# Patient Record
Sex: Female | Born: 1955 | Race: White | Hispanic: No | Marital: Married | Smoking: Never smoker
Health system: Southern US, Community
[De-identification: ages and names within clinical notes are randomized; demographics above are authoritative.]

## PROBLEM LIST (undated history)

## (undated) DIAGNOSIS — I10 Essential (primary) hypertension: Secondary | ICD-10-CM

## (undated) DIAGNOSIS — M1990 Unspecified osteoarthritis, unspecified site: Secondary | ICD-10-CM

## (undated) DIAGNOSIS — I2510 Atherosclerotic heart disease of native coronary artery without angina pectoris: Secondary | ICD-10-CM

## (undated) DIAGNOSIS — E119 Type 2 diabetes mellitus without complications: Secondary | ICD-10-CM

## (undated) HISTORY — DX: Essential (primary) hypertension: I10

---

## 1998-04-06 ENCOUNTER — Ambulatory Visit: Admission: RE | Admit: 1998-04-06 | Discharge: 1998-04-06 | Payer: Self-pay | Admitting: Internal Medicine

## 1998-07-23 ENCOUNTER — Ambulatory Visit (HOSPITAL_BASED_OUTPATIENT_CLINIC_OR_DEPARTMENT_OTHER): Admission: RE | Admit: 1998-07-23 | Discharge: 1998-07-23 | Payer: Self-pay | Admitting: Otolaryngology

## 1998-07-23 ENCOUNTER — Encounter: Payer: Self-pay | Admitting: Surgery

## 2013-05-09 ENCOUNTER — Ambulatory Visit: Payer: Self-pay | Admitting: Internal Medicine

## 2013-05-09 VITALS — BP 138/88 | HR 118 | Temp 98.0°F | Resp 16

## 2013-05-09 DIAGNOSIS — E663 Overweight: Secondary | ICD-10-CM

## 2013-05-09 DIAGNOSIS — R259 Unspecified abnormal involuntary movements: Secondary | ICD-10-CM

## 2013-05-09 DIAGNOSIS — R251 Tremor, unspecified: Secondary | ICD-10-CM

## 2013-05-09 DIAGNOSIS — E119 Type 2 diabetes mellitus without complications: Secondary | ICD-10-CM | POA: Insufficient documentation

## 2013-05-09 DIAGNOSIS — M25569 Pain in unspecified knee: Secondary | ICD-10-CM

## 2013-05-09 DIAGNOSIS — M25519 Pain in unspecified shoulder: Secondary | ICD-10-CM

## 2013-05-09 DIAGNOSIS — R42 Dizziness and giddiness: Secondary | ICD-10-CM

## 2013-05-09 DIAGNOSIS — E66811 Obesity, class 1: Secondary | ICD-10-CM | POA: Insufficient documentation

## 2013-05-09 DIAGNOSIS — E669 Obesity, unspecified: Secondary | ICD-10-CM | POA: Insufficient documentation

## 2013-05-09 LAB — POCT CBC
Granulocyte percent: 71.1 %G (ref 37–80)
HCT, POC: 47.4 % (ref 37.7–47.9)
Hemoglobin: 14.4 g/dL (ref 12.2–16.2)
Lymph, poc: 1.1 (ref 0.6–3.4)
MCH, POC: 29.8 pg (ref 27–31.2)
MCHC: 30.4 g/dL — AB (ref 31.8–35.4)
MCV: 97.9 fL — AB (ref 80–97)
MID (cbc): 0.4 (ref 0–0.9)
MPV: 11.6 fL (ref 0–99.8)
POC Granulocyte: 3.6 (ref 2–6.9)
POC LYMPH PERCENT: 20.9 %L (ref 10–50)
POC MID %: 8 %M (ref 0–12)
Platelet Count, POC: 123 10*3/uL — AB (ref 142–424)
RBC: 4.84 M/uL (ref 4.04–5.48)
RDW, POC: 14.6 %
WBC: 5.1 10*3/uL (ref 4.6–10.2)

## 2013-05-09 LAB — COMPREHENSIVE METABOLIC PANEL
ALT: 36 U/L — ABNORMAL HIGH (ref 0–35)
AST: 33 U/L (ref 0–37)
Albumin: 4.2 g/dL (ref 3.5–5.2)
Alkaline Phosphatase: 157 U/L — ABNORMAL HIGH (ref 39–117)
BUN: 14 mg/dL (ref 6–23)
CO2: 26 mEq/L (ref 19–32)
Calcium: 9.7 mg/dL (ref 8.4–10.5)
Chloride: 104 mEq/L (ref 96–112)
Creat: 0.63 mg/dL (ref 0.50–1.10)
Glucose, Bld: 160 mg/dL — ABNORMAL HIGH (ref 70–99)
Potassium: 4.3 mEq/L (ref 3.5–5.3)
Sodium: 139 mEq/L (ref 135–145)
Total Bilirubin: 0.8 mg/dL (ref 0.3–1.2)
Total Protein: 8.2 g/dL (ref 6.0–8.3)

## 2013-05-09 LAB — POCT GLYCOSYLATED HEMOGLOBIN (HGB A1C): Hemoglobin A1C: 7.4

## 2013-05-09 LAB — TSH: TSH: 1.763 u[IU]/mL (ref 0.350–4.500)

## 2013-05-09 MED ORDER — MELOXICAM 15 MG PO TABS: 15.0000 mg | ORAL_TABLET | Freq: Every day | ORAL | Status: DC

## 2013-05-09 NOTE — Progress Notes (Addendum)
Subjective:    Patient ID: Robin Schroeder, female    DOB: Sep 17, 1955, 58 y.o.   MRN: [id number] This chart was scribed for Ellamae Sia, MD by Valera Castle, ED Scribe. This patient was seen in room 09 and the patient's care was started at 12:06 PM.  Chief Complaint  Patient presents with  . Dizziness    x1 year  . Shoulder Pain    x2 weeks  . Knee Pain    x6 mth  . Otalgia    x1 week  . Cough    x1 mth    HPI Robin Schroeder is a 58 y.o. female who presents for her first [facility] office visit complaining of  intermittent episodes of dizziness, stating she has had trouble balancing, standing at times, onset 1 year ago. She denies having seen anyone for her symptoms. She reports associated otalgia, onset 1 month ago. She denies hearing change, ringing. She reports that her dizzy spells started before her ear problem. She reports that standing up exacerbates her dizziness. She reports falling 09/18/2010 due to dizziness, sustaining bruises over her left leg, left arm, and her nose. This does not occur on a daily basis. It seems progressively worse.  She reports constant, bilateral knee and shoulder joint pain, onset several months ago. She reports difficulty ambulating due to her pain. She reports trouble sleeping due to the pain. Husband reports that when she is resting, she is fine, but when working her pain is excruciating. She works as a Advice worker. She reports taking Aleve, with some relief, but states she also experiences ill side effects and does not take it often. She denies her having been formally diagnosed. She reports h/o tremors. She denies neck pain, and any other associated symptoms. She has noticed occasional swelling in her lower legs. Her knees are swollen.  She denies being formally diagnosed with DM, but husband reports that she had an episode of trembling 1 month ago. They called EMS, who recorded high sugar levels-359 with BP 210/117. She denies being hospitalized,  stating they told her she needs to be checked out by doctor but she has refused until female, . Husband reports that she has been eating better recently. He has controlled her diet and removed all sugar and in many carbohydrates over the last 2 weeks .  PCP - No PCP Per Patient  husband is concerned because she has no insurance .he cannot afford insurance offered by [employer] at $700 a month or by his company insurance at $520 a month . There are no active problems to display for this patient.  Prior to Admission medications   Not on File    Review of Systems  Constitutional: Negative for fever, appetite change and fatigue.  HENT: Positive for ear pain.        ? Hearing loss and as she reportedly needs extra volume for television/she has chronic cerumen impaction  Eyes: Negative for visual disturbance.  Respiratory: Negative for shortness of breath.   Cardiovascular: Negative for chest pain, palpitations and leg swelling.  Gastrointestinal: Negative for abdominal pain and diarrhea.  Genitourinary: Negative for difficulty urinating.  Musculoskeletal: Positive for arthralgias (bilateral shoulder, knee joint) and gait problem. Negative for neck pain.  Neurological: Positive for dizziness and tremors. Negative for syncope.      Objective:   Physical Exam  Nursing note and vitals reviewed. Constitutional: She is oriented to person, place, and time. She appears well-developed and well-nourished. No distress.  HENT:  Head: Normocephalic and atraumatic.  Mouth/Throat: Oropharynx is clear and moist.  Bilateral cerumen impaction  Eyes: Conjunctivae and EOM are normal. Pupils are equal, round, and reactive to light.  Neck: Neck supple. No thyromegaly present.  Neck range of motion is limited by stiffness but produces no radicular symptoms  Cardiovascular: Normal rate, regular rhythm, normal heart sounds and intact distal pulses.   No murmur heard. Pulmonary/Chest: Effort normal and breath sounds  normal. No respiratory distress.  Musculoskeletal: Normal range of motion. She exhibits tenderness. She exhibits no edema.  Frozen shoulder bilaterally. Pain with abduction against resistance. Pain at 45 abduction with stiffness. Both knees had diffuse swelling without effusion. Range of motion limited by pain and lacks full extension. Tender along both joint lines. No laxity  Lymphadenopathy:    She has no cervical adenopathy.  Neurological: She is alert and oriented to person, place, and time. She has normal reflexes. No cranial nerve deficit. Coordination normal.  Romberg is negative although she is unsteady standing due to knee pain. Persistent fine tremor in upper extremities at rest.  Skin: Skin is warm and dry. No rash noted.  Psychiatric: She has a normal mood and affect. Her behavior is normal.    BP 138/88  Pulse 118  Temp(Src) 98 F (36.7 C) (Oral)  Resp 16  SpO2 99%  Results for orders placed in visit on 05/09/13  POCT CBC      Result Value Range   WBC 5.1  4.6 - 10.2 K/uL   Lymph, poc 1.1  0.6 - 3.4   POC LYMPH PERCENT 20.9  10 - 50 %L   MID (cbc) 0.4  0 - 0.9   POC MID % 8.0  0 - 12 %M   POC Granulocyte 3.6  2 - 6.9   Granulocyte percent 71.1  37 - 80 %G   RBC 4.84  4.04 - 5.48 M/uL   Hemoglobin 14.4  12.2 - 16.2 g/dL   HCT, POC 18.5  63.1 - 47.9 %   MCV 97.9 (*) 80 - 97 fL   MCH, POC 29.8  27 - 31.2 pg   MCHC 30.4 (*) 31.8 - 35.4 g/dL   RDW, POC 49.7     Platelet Count, POC 123 (*) 142 - 424 K/uL   MPV 11.6  0 - 99.8 fL   hemoglobin A1c 7.4%    Assessment & Plan:   1. Dizziness   2. Knee pain --bilateral /moderate osteoarthritis   3. 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 Overweight   5. Tremor   6.  new onset diabetes-age uncertain 7.  probable hypertension but normal pressures today  8.  no recent medical care 9.  Uninsured 10. Unable to perform her work duties due to inability stand and  shoulder pain with use of arms  Plan -Continue diet control -[facility]erred [facility]/ -Referred to orthopedics for treatment of shoulders and knees in hopes that work capabilities can be restored---meloxicam for now [employer] of work papers completed with time extended to allow control of medical issues I have completed the patient encounter in its entirety as documented by the scribe, with editing by me where necessary. Krislynn Gronau P. Merla Riches, M.D.

## 2013-05-17 ENCOUNTER — Telehealth: Payer: Self-pay

## 2013-05-17 NOTE — Telephone Encounter (Signed)
Dr Merla Riches, please review

## 2013-05-17 NOTE — Telephone Encounter (Signed)
Patient last saw Robin Schroeder on the 23rd and was wondering why she hasn't heard back from [place] to give  her lab results. Please advise. If maybe someone else can review results and let her know. Thank you  Best: [phone]

## 2013-05-18 ENCOUNTER — Encounter: Payer: Self-pay | Admitting: Internal Medicine

## 2013-05-18 NOTE — Telephone Encounter (Signed)
Called pt, LMOM labs normal.

## 2013-05-18 NOTE — Telephone Encounter (Signed)
Tell her labs wnl-I had lab mail a copy with comments

## 2014-01-27 ENCOUNTER — Inpatient Hospital Stay (HOSPITAL_COMMUNITY): Payer: Medicaid Other

## 2014-01-27 ENCOUNTER — Encounter (HOSPITAL_COMMUNITY): Payer: Self-pay | Admitting: Emergency Medicine

## 2014-01-27 ENCOUNTER — Emergency Department (HOSPITAL_COMMUNITY): Payer: Medicaid Other

## 2014-01-27 ENCOUNTER — Inpatient Hospital Stay (HOSPITAL_COMMUNITY)
Admission: EM | Admit: 2014-01-27 | Discharge: 2014-02-04 | DRG: 872 | Disposition: A | Discharge status: Skilled Nursing Facility | Payer: Medicaid Other | Attending: Internal Medicine | Admitting: Internal Medicine

## 2014-01-27 DIAGNOSIS — R609 Edema, unspecified: Secondary | ICD-10-CM | POA: Diagnosis present

## 2014-01-27 DIAGNOSIS — E8809 Other disorders of plasma-protein metabolism, not elsewhere classified: Secondary | ICD-10-CM | POA: Diagnosis present

## 2014-01-27 DIAGNOSIS — E669 Obesity, unspecified: Secondary | ICD-10-CM | POA: Diagnosis present

## 2014-01-27 DIAGNOSIS — K5900 Constipation, unspecified: Secondary | ICD-10-CM | POA: Diagnosis not present

## 2014-01-27 DIAGNOSIS — N390 Urinary tract infection, site not specified: Secondary | ICD-10-CM | POA: Diagnosis present

## 2014-01-27 DIAGNOSIS — A4101 Sepsis due to Methicillin susceptible Staphylococcus aureus: Principal | ICD-10-CM | POA: Diagnosis present

## 2014-01-27 DIAGNOSIS — J9811 Atelectasis: Secondary | ICD-10-CM | POA: Diagnosis present

## 2014-01-27 DIAGNOSIS — B9620 Unspecified Escherichia coli [E. coli] as the cause of diseases classified elsewhere: Secondary | ICD-10-CM | POA: Diagnosis present

## 2014-01-27 DIAGNOSIS — Z7401 Bed confinement status: Secondary | ICD-10-CM | POA: Diagnosis not present

## 2014-01-27 DIAGNOSIS — M174 Other bilateral secondary osteoarthritis of knee: Secondary | ICD-10-CM

## 2014-01-27 DIAGNOSIS — M170 Bilateral primary osteoarthritis of knee: Secondary | ICD-10-CM | POA: Diagnosis present

## 2014-01-27 DIAGNOSIS — Z880 Allergy status to penicillin: Secondary | ICD-10-CM

## 2014-01-27 DIAGNOSIS — D696 Thrombocytopenia, unspecified: Secondary | ICD-10-CM | POA: Diagnosis present

## 2014-01-27 DIAGNOSIS — I5032 Chronic diastolic (congestive) heart failure: Secondary | ICD-10-CM | POA: Diagnosis present

## 2014-01-27 DIAGNOSIS — Z6830 Body mass index (BMI) 30.0-30.9, adult: Secondary | ICD-10-CM | POA: Diagnosis not present

## 2014-01-27 DIAGNOSIS — R52 Pain, unspecified: Secondary | ICD-10-CM

## 2014-01-27 DIAGNOSIS — M25511 Pain in right shoulder: Secondary | ICD-10-CM | POA: Diagnosis present

## 2014-01-27 DIAGNOSIS — E876 Hypokalemia: Secondary | ICD-10-CM | POA: Diagnosis present

## 2014-01-27 DIAGNOSIS — B373 Candidiasis of vulva and vagina: Secondary | ICD-10-CM | POA: Diagnosis present

## 2014-01-27 DIAGNOSIS — R0602 Shortness of breath: Secondary | ICD-10-CM

## 2014-01-27 DIAGNOSIS — K5909 Other constipation: Secondary | ICD-10-CM

## 2014-01-27 DIAGNOSIS — R338 Other retention of urine: Secondary | ICD-10-CM | POA: Diagnosis present

## 2014-01-27 DIAGNOSIS — M25512 Pain in left shoulder: Secondary | ICD-10-CM | POA: Diagnosis present

## 2014-01-27 DIAGNOSIS — E119 Type 2 diabetes mellitus without complications: Secondary | ICD-10-CM | POA: Diagnosis present

## 2014-01-27 DIAGNOSIS — Z791 Long term (current) use of non-steroidal anti-inflammatories (NSAID): Secondary | ICD-10-CM

## 2014-01-27 DIAGNOSIS — I10 Essential (primary) hypertension: Secondary | ICD-10-CM | POA: Diagnosis present

## 2014-01-27 DIAGNOSIS — M25562 Pain in left knee: Secondary | ICD-10-CM

## 2014-01-27 DIAGNOSIS — M25561 Pain in right knee: Secondary | ICD-10-CM

## 2014-01-27 DIAGNOSIS — B9561 Methicillin susceptible Staphylococcus aureus infection as the cause of diseases classified elsewhere: Secondary | ICD-10-CM | POA: Diagnosis present

## 2014-01-27 DIAGNOSIS — Z8249 Family history of ischemic heart disease and other diseases of the circulatory system: Secondary | ICD-10-CM

## 2014-01-27 DIAGNOSIS — R7881 Bacteremia: Secondary | ICD-10-CM

## 2014-01-27 DIAGNOSIS — A419 Sepsis, unspecified organism: Secondary | ICD-10-CM | POA: Diagnosis present

## 2014-01-27 DIAGNOSIS — R5381 Other malaise: Secondary | ICD-10-CM | POA: Diagnosis present

## 2014-01-27 HISTORY — DX: Type 2 diabetes mellitus without complications: E11.9

## 2014-01-27 HISTORY — DX: Unspecified osteoarthritis, unspecified site: M19.90

## 2014-01-27 LAB — URINALYSIS, ROUTINE W REFLEX MICROSCOPIC
GLUCOSE, UA: NEGATIVE mg/dL
Ketones, ur: 15 mg/dL — AB
Nitrite: POSITIVE — AB
Protein, ur: 30 mg/dL — AB
Specific Gravity, Urine: 1.029 (ref 1.005–1.030)
UROBILINOGEN UA: 4 mg/dL — AB (ref 0.0–1.0)
pH: 5.5 (ref 5.0–8.0)

## 2014-01-27 LAB — BASIC METABOLIC PANEL
ANION GAP: 14 (ref 5–15)
BUN: 22 mg/dL (ref 6–23)
CHLORIDE: 101 meq/L (ref 96–112)
CO2: 21 meq/L (ref 19–32)
Calcium: 8.8 mg/dL (ref 8.4–10.5)
Creatinine, Ser: 0.44 mg/dL — ABNORMAL LOW (ref 0.50–1.10)
GFR calc Af Amer: >90 mL/min (ref >90)
GFR calc non Af Amer: >90 mL/min (ref >90)
Glucose, Bld: 199 mg/dL — ABNORMAL HIGH (ref 70–99)
Potassium: 3.7 mEq/L (ref 3.7–5.3)
SODIUM: 136 meq/L — AB (ref 137–147)

## 2014-01-27 LAB — I-STAT CG4 LACTIC ACID, ED: Lactic Acid, Venous: 2.68 mmol/L — ABNORMAL HIGH (ref 0.5–2.2)

## 2014-01-27 LAB — CBC WITH DIFFERENTIAL/PLATELET
BASOS ABS: 0 10*3/uL (ref 0.0–0.1)
Basophils Relative: 0 % (ref 0–1)
Eosinophils Absolute: 0 10*3/uL (ref 0.0–0.7)
Eosinophils Relative: 0 % (ref 0–5)
HEMATOCRIT: 34.2 % — AB (ref 36.0–46.0)
HEMOGLOBIN: 11.4 g/dL — AB (ref 12.0–15.0)
LYMPHS PCT: 6 % — AB (ref 12–46)
Lymphs Abs: 0.3 10*3/uL — ABNORMAL LOW (ref 0.7–4.0)
MCH: 29.4 pg (ref 26.0–34.0)
MCHC: 33.3 g/dL (ref 30.0–36.0)
MCV: 88.1 fL (ref 78.0–100.0)
MONO ABS: 0.4 10*3/uL (ref 0.1–1.0)
MONOS PCT: 9 % (ref 3–12)
Neutro Abs: 3.9 10*3/uL (ref 1.7–7.7)
Neutrophils Relative %: 86 % — ABNORMAL HIGH (ref 43–77)
Platelets: 105 10*3/uL — ABNORMAL LOW (ref 150–400)
RBC: 3.88 MIL/uL (ref 3.87–5.11)
RDW: 15.5 % (ref 11.5–15.5)
WBC: 4.5 10*3/uL (ref 4.0–10.5)

## 2014-01-27 LAB — URINE MICROSCOPIC-ADD ON

## 2014-01-27 LAB — CBG MONITORING, ED: Glucose-Capillary: 189 mg/dL — ABNORMAL HIGH (ref 70–99)

## 2014-01-27 MED ORDER — ACETAMINOPHEN 325 MG PO TABS
650.0000 mg | ORAL_TABLET | Freq: Four times a day (QID) | ORAL | Status: DC | PRN
  Administered 2014-01-28: 650 mg via ORAL
  Filled 2014-01-27: qty 2

## 2014-01-27 MED ORDER — IBUPROFEN 400 MG PO TABS
400.0000 mg | ORAL_TABLET | Freq: Once | ORAL | Status: AC
  Administered 2014-01-27: 400 mg via ORAL
  Filled 2014-01-27: qty 1

## 2014-01-27 MED ORDER — MELOXICAM 15 MG PO TABS
15.0000 mg | ORAL_TABLET | Freq: Every day | ORAL | Status: DC
  Administered 2014-01-27 – 2014-02-04 (×9): 15 mg via ORAL
  Filled 2014-01-27 (×10): qty 1

## 2014-01-27 MED ORDER — INSULIN ASPART 100 UNIT/ML ~~LOC~~ SOLN
0.0000 [IU] | Freq: Three times a day (TID) | SUBCUTANEOUS | Status: DC
  Administered 2014-01-28: 2 [IU] via SUBCUTANEOUS
  Administered 2014-01-28: 3 [IU] via SUBCUTANEOUS
  Administered 2014-01-28: 2 [IU] via SUBCUTANEOUS
  Administered 2014-01-29: 3 [IU] via SUBCUTANEOUS
  Administered 2014-01-29: 2 [IU] via SUBCUTANEOUS
  Administered 2014-01-29 – 2014-01-30 (×2): 5 [IU] via SUBCUTANEOUS
  Administered 2014-01-30 – 2014-02-01 (×4): 3 [IU] via SUBCUTANEOUS
  Administered 2014-02-02 (×2): 2 [IU] via SUBCUTANEOUS
  Administered 2014-02-03: 8 [IU] via SUBCUTANEOUS
  Administered 2014-02-03: 3 [IU] via SUBCUTANEOUS
  Administered 2014-02-03: 5 [IU] via SUBCUTANEOUS
  Administered 2014-02-04: 3 [IU] via SUBCUTANEOUS
  Administered 2014-02-04: 2 [IU] via SUBCUTANEOUS

## 2014-01-27 MED ORDER — HEPARIN SODIUM (PORCINE) 5000 UNIT/ML IJ SOLN
5000.0000 [IU] | Freq: Three times a day (TID) | INTRAMUSCULAR | Status: DC
  Administered 2014-01-27 – 2014-01-30 (×8): 5000 [IU] via SUBCUTANEOUS
  Filled 2014-01-27 (×12): qty 1

## 2014-01-27 MED ORDER — DEXTROSE 5 % IV SOLN
1.0000 g | INTRAVENOUS | Status: DC
  Filled 2014-01-27: qty 10

## 2014-01-27 MED ORDER — DEXTROSE 5 % IV SOLN
1.0000 g | Freq: Once | INTRAVENOUS | Status: AC
  Administered 2014-01-27: 1 g via INTRAVENOUS
  Filled 2014-01-27: qty 10

## 2014-01-27 MED ORDER — SODIUM CHLORIDE 0.9 % IV BOLUS (SEPSIS)
1000.0000 mL | Freq: Once | INTRAVENOUS | Status: AC
  Administered 2014-01-27: 1000 mL via INTRAVENOUS

## 2014-01-27 MED ORDER — SODIUM CHLORIDE 0.9 % IV SOLN
Freq: Once | INTRAVENOUS | Status: AC
  Administered 2014-01-27: 19:00:00 via INTRAVENOUS

## 2014-01-27 MED ORDER — SODIUM CHLORIDE 0.9 % IV SOLN
INTRAVENOUS | Status: DC
  Administered 2014-01-27 – 2014-01-29 (×9): via INTRAVENOUS
  Administered 2014-02-01: 500 mL via INTRAVENOUS

## 2014-01-27 MED ORDER — ACETAMINOPHEN 325 MG PO TABS
650.0000 mg | ORAL_TABLET | Freq: Once | ORAL | Status: AC
  Administered 2014-01-27: 650 mg via ORAL
  Filled 2014-01-27: qty 2

## 2014-01-27 MED ORDER — SODIUM CHLORIDE 0.9 % IJ SOLN
3.0000 mL | Freq: Two times a day (BID) | INTRAMUSCULAR | Status: DC
  Administered 2014-01-27 – 2014-01-28 (×2): 3 mL via INTRAVENOUS

## 2014-01-27 NOTE — ED Notes (Signed)
Per EMS, pt comes from home A&OX4. NAD noted, pt denies chest pain or SOB. Pt has h/o hypertension, DM, and arthritis. Pt suffered a fall on Saturday. Pt hit back of head, pt denies LOC. Pt has had decrease movement and weakness for the past 4 days. Pt has had no appetite or has not eaten in the past 4 days, Pt has had incontinent episodes with foul urine. Last BM 1 week ago. Pt has not taken any meds in 10 mos. VS: BP 118/60, P130, R 22, T99.1, 95% RM air, 20g IV placed R hand, given 500cc of normal saline. Husband at bedside

## 2014-01-27 NOTE — ED Notes (Signed)
i-stat CG4 Lactic acid result given To Dr. Fayrene Fearing

## 2014-01-27 NOTE — ED Notes (Signed)
All pts belongings taken with pt and family upstairs.

## 2014-01-27 NOTE — H&P (Signed)
[facility] History and Physical  BRANDYE INTHAVONG YFV:[id number] DOB: Jan 01, 1956 DOA: 01/27/2014  Referring physician: EDP PCP: No PCP Per Patient   Chief Complaint: Generalized weakness   HPI: Robin Schroeder is a 58 y.o. female who presents to the ED for evaluation of weakness.  She has had a functional decline over the past few months due to knee and hip pain, she has been feeling much worse over the past 4 days.  Over the past 4 days she has had fever, chills, foul smelling urine, no dysuria.  Does have some SOB, mild cough, no rash.  Review of Systems: Systems reviewed.  As above, otherwise negative  Past Medical History  Diagnosis Date  . Hypertension   . Diabetes mellitus without complication   . Arthritis    History reviewed. No pertinent past surgical history. Social History:  reports that she has never smoked. She does not have any smokeless tobacco history on file. Her alcohol and drug histories are not on file.  Allergies  Allergen Reactions  . Penicillins     Family History  Problem Relation Age of Onset  . Hypertension Brother      Prior to Admission medications   Medication Sig Start Date End Date Taking? Authorizing Provider  meloxicam (MOBIC) 15 MG tablet Take 1 tablet (15 mg total) by mouth daily. 05/09/13   Tonye Pearson, MD   Physical Exam: Filed Vitals:   01/27/14 2015  BP: 124/66  Pulse: 126  Temp:   Resp:     BP 124/66  Pulse 126  Temp(Src) 103 F (39.4 C) (Rectal)  Resp 34  Ht 5\' 4"  (1.626 m)  Wt 90.719 kg (200 lb)  BMI 34.31 kg/m2  SpO2 94%  General Appearance:    Alert, oriented, appears toxic, appears stated age  Head:    Normocephalic, atraumatic  Eyes:    PERRL, EOMI, sclera non-icteric        Nose:   Nares without drainage or epistaxis. Mucosa, turbinates normal  Throat:   Moist mucous membranes. Oropharynx without erythema or exudate.  Neck:   Supple. No carotid bruits.  No thyromegaly.  No lymphadenopathy.    Back:     No CVA tenderness, no spinal tenderness  Lungs:     Clear to auscultation bilaterally, without wheezes, rhonchi or rales, tachypnea  Chest wall:    No tenderness to palpitation  Heart:    Sinus tach without murmurs, gallops, rubs  Abdomen:     Soft, non-tender, nondistended, normal bowel sounds, no organomegaly  Genitalia:    deferred  Rectal:    deferred  Extremities:   No clubbing, cyanosis or edema.  Pulses:   2+ and symmetric all extremities  Skin:   Skin color, texture, turgor normal, no rashes or lesions, or other obvious sources of infection  Lymph nodes:   Cervical, supraclavicular, and axillary nodes normal  Neurologic:   CNII-XII intact. Normal strength, sensation and reflexes      throughout    Labs on Admission:  Basic Metabolic Panel:  Recent Labs Lab 01/27/14 1820  NA 136*  K 3.7  CL 101  CO2 21  GLUCOSE 199*  BUN 22  CREATININE 0.44*  CALCIUM 8.8   Liver Function Tests: No results found for this basename: AST, ALT, ALKPHOS, BILITOT, PROT, ALBUMIN,  in the last 168 hours No results found for this basename: LIPASE, AMYLASE,  in the last 168 hours No results found for this basename: AMMONIA,  in the  last 168 hours CBC:  Recent Labs Lab 01/27/14 1820  WBC 4.5  NEUTROABS 3.9  HGB 11.4*  HCT 34.2*  MCV 88.1  PLT 105*   Cardiac Enzymes: No results found for this basename: CKTOTAL, CKMB, CKMBINDEX, TROPONINI,  in the last 168 hours  BNP (last 3 results) No results found for this basename: PROBNP,  in the last 8760 hours CBG: No results found for this basename: GLUCAP,  in the last 168 hours  Radiological Exams on Admission: Dg Chest Port 1 View  01/27/2014   CLINICAL DATA:  Confusion, shortness of breath.  Initial encounter.  EXAM: PORTABLE CHEST - 1 VIEW  COMPARISON:  None.  FINDINGS: Examination is degraded due to patient rotation and portable technique.  Borderline enlarged cardiac silhouette and mediastinal contours, possibly accentuated  due to decreased lung volumes and patient rotation to the left. Mild pulmonary venous congestion without frank evidence of edema. Minimal bilateral infrahilar heterogeneous opacities, right greater than left. Trace left-sided effusion is not excluded. No pneumothorax. No acute osseus abnormalities. Degenerative change of the right AC joint is suspected though incompletely evaluated.  IMPRESSION: Pulmonary venous congestion and bilateral infrahilar atelectasis suspected on this rotated AP portable examination. Further evaluation with a PA and lateral chest radiograph may be obtained as clinically indicated.   Electronically Signed   By: Simonne Come M.D.   On: 01/27/2014 18:47    EKG: Independently reviewed.  Assessment/Plan Principal Problem:   Sepsis Active Problems:   UTI (lower urinary tract infection)   DM2 (diabetes mellitus, type 2)   1. Sepsis, appears to be secondary to patients UTI -  1. US renal 2. Rocephin 3. Tele monitor 4. IVF 5. Tylenol for fever 6. Blood and urine cultures 7. Check LFTs 8. Repeat lactate in 6 hours 2. DM2 - not on any home meds, adding SSI med dose AC/HS    Code Status: Full Code  Family Communication: No family in room Disposition Plan: Admit to inpatient   Time spent: 70 min  GARDNER, JARED M. [facility] Pager [phone]  If 7AM-7PM, please contact the day team taking care of the patient [email or web address] Password [facility] 01/27/2014, 8:36 PM

## 2014-01-27 NOTE — ED Provider Notes (Signed)
CSN: [id number]     Arrival date & time 01/27/14  1651 History   First MD Initiated Contact with Patient 01/27/14 1700     Chief Complaint  Patient presents with  . Weakness      HPI  Patient presents for evaluation of weakness. She has most report a functional decline over the last few months. Primarily this is related to pain. As the knee and hip pain. On Saturday was attempting to get into bed and took a fall. No particular injury.  Decreased appetite. Shakes and chills at home and weakness. No episodes of confusion. The lateralizing weakness. Week to the point she having trouble ambulating with assistance to get to the bathroom. Strong smelling urine.  Past Medical History  Diagnosis Date  . Hypertension   . Diabetes mellitus without complication   . Arthritis    History reviewed. No pertinent past surgical history. Family History  Problem Relation Age of Onset  . Hypertension Brother    History  Substance Use Topics  . Smoking status: Never Smoker   . Smokeless tobacco: Not on file  . Alcohol Use: Not on file   OB History   Grav Para Term Preterm Abortions TAB SAB Ect Mult Living                 Review of Systems  Constitutional: Positive for activity change and appetite change. Negative for fever, chills, diaphoresis and fatigue.  HENT: Negative for mouth sores, sore throat and trouble swallowing.   Eyes: Negative for visual disturbance.  Respiratory: Positive for cough. Negative for chest tightness, shortness of breath and wheezing.   Cardiovascular: Negative for chest pain.  Gastrointestinal: Negative for nausea, vomiting, abdominal pain, diarrhea and abdominal distention.  Endocrine: Negative for polydipsia, polyphagia and polyuria.  Genitourinary: Positive for dysuria and frequency. Negative for hematuria.  Musculoskeletal: Positive for arthralgias and myalgias. Negative for gait problem.  Skin: Negative for color change, pallor and rash.  Neurological:  Positive for weakness. Negative for dizziness, syncope, light-headedness and headaches.  Hematological: Does not bruise/bleed easily.  Psychiatric/Behavioral: Negative for behavioral problems and confusion.      Allergies  Penicillins  Home Medications   Prior to Admission medications   Medication Sig Start Date End Date Taking? Authorizing Provider  meloxicam (MOBIC) 15 MG tablet Take 1 tablet (15 mg total) by mouth daily. 05/09/13   Tonye Pearson, MD   BP 142/67  Pulse 132  Temp(Src) 103 F (39.4 C) (Rectal)  Resp 34  Ht 5\' 4"  (1.626 m)  Wt 200 lb (90.719 kg)  BMI 34.31 kg/m2  SpO2 94% Physical Exam  Constitutional: She is oriented to person, place, and time. She appears ill. No distress.  Obese. Not confused. Appears to not fill the prednisone frankly toxic. Mentating well.  HENT:  Head: Normocephalic.  Conjunctiva not pale or injected.  Eyes: Conjunctivae are normal. Pupils are equal, round, and reactive to light. No scleral icterus.  No scleral icterus.  Neck: Normal range of motion. Neck supple. No thyromegaly present.  Cardiovascular: Normal rate and regular rhythm.  Exam reveals no gallop and no friction rub.   No murmur heard. Tachycardic. Sinus tach 120 on the monitor noted. No gallops. 1+ bilateral symmetric LE peripheral edema.  Pulmonary/Chest: Effort normal and breath sounds normal. No respiratory distress. She has no wheezes. She has no rales.  Tachypnea. Clear lungs.  Abdominal: Soft. Bowel sounds are normal. She exhibits no distension. There is no tenderness. There is no  rebound.    Musculoskeletal: Normal range of motion.   knees and ankle show no erythema or joint effusions.  Neurological: She is alert and oriented to person, place, and time.  Skin: Skin is warm and dry. No rash noted.  Psychiatric: She has a normal mood and affect. Her behavior is normal.    ED Course  Procedures (including critical care time) Labs Review Labs Reviewed    BASIC METABOLIC PANEL - Abnormal; Notable for the following:    Sodium 136 (*)    Glucose, Bld 199 (*)    Creatinine, Ser 0.44 (*)    All other components within normal limits  CBC WITH DIFFERENTIAL - Abnormal; Notable for the following:    Hemoglobin 11.4 (*)    HCT 34.2 (*)    Platelets 105 (*)    Neutrophils Relative % 86 (*)    Lymphocytes Relative 6 (*)    Lymphs Abs 0.3 (*)    All other components within normal limits  URINALYSIS, ROUTINE W REFLEX MICROSCOPIC - Abnormal; Notable for the following:    Color, Urine AMBER (*)    APPearance CLOUDY (*)    Hgb urine dipstick SMALL (*)    Bilirubin Urine MODERATE (*)    Ketones, ur 15 (*)    Protein, ur 30 (*)    Urobilinogen, UA 4.0 (*)    Nitrite POSITIVE (*)    Leukocytes, UA SMALL (*)    All other components within normal limits  URINE MICROSCOPIC-ADD ON - Abnormal; Notable for the following:    Squamous Epithelial / LPF FEW (*)    Bacteria, UA MANY (*)    Casts GRANULAR CAST (*)    All other components within normal limits  I-STAT CG4 LACTIC ACID, ED - Abnormal; Notable for the following:    Lactic Acid, Venous 2.68 (*)    All other components within normal limits  CULTURE, BLOOD (ROUTINE X 2)  CULTURE, BLOOD (ROUTINE X 2)  URINE CULTURE    Imaging Review Dg Chest Port 1 View  01/27/2014   CLINICAL DATA:  Confusion, shortness of breath.  Initial encounter.  EXAM: PORTABLE CHEST - 1 VIEW  COMPARISON:  None.  FINDINGS: Examination is degraded due to patient rotation and portable technique.  Borderline enlarged cardiac silhouette and mediastinal contours, possibly accentuated due to decreased lung volumes and patient rotation to the left. Mild pulmonary venous congestion without frank evidence of edema. Minimal bilateral infrahilar heterogeneous opacities, right greater than left. Trace left-sided effusion is not excluded. No pneumothorax. No acute osseus abnormalities. Degenerative change of the right AC joint is suspected  though incompletely evaluated.  IMPRESSION: Pulmonary venous congestion and bilateral infrahilar atelectasis suspected on this rotated AP portable examination. Further evaluation with a PA and lateral chest radiograph may be obtained as clinically indicated.   Electronically Signed   By: Simonne Come M.D.   On: 01/27/2014 18:47     EKG Interpretation   Date/Time:  Tuesday January 27 2014 17:05:11 EDT Ventricular Rate:  140 PR Interval:  123 QRS Duration: 84 QT Interval:  313 QTC Calculation: 478 R Axis:   49 Text Interpretation:  Sinus tachycardia Confirmed by Fayrene Fearing  MD, Walther Sanagustin  [phone]) on 01/27/2014 5:07:55 PM      MDM   Final diagnoses:  SOB (shortness of breath)    Urine appears infected. Atelectasis on x-ray. After Tylenol her temperature improved. We'll respect 103. Renal function intact. His been given additional fluids and by mouth Motrin. Given IV Rocephin.  She continues to [facility] well. Hemodynamic stable.    Rolland Porter, MD 01/27/14 2002

## 2014-01-27 NOTE — ED Notes (Signed)
Pt's husband stated that pt has not bathed in a year, and asked if we could bathe her. Husband told that pt will be given a bed bath when and if time permits during ED visit.

## 2014-01-28 ENCOUNTER — Inpatient Hospital Stay (HOSPITAL_COMMUNITY): Payer: Medicaid Other

## 2014-01-28 DIAGNOSIS — R5381 Other malaise: Secondary | ICD-10-CM | POA: Diagnosis present

## 2014-01-28 DIAGNOSIS — I5032 Chronic diastolic (congestive) heart failure: Secondary | ICD-10-CM | POA: Diagnosis present

## 2014-01-28 DIAGNOSIS — M170 Bilateral primary osteoarthritis of knee: Secondary | ICD-10-CM | POA: Diagnosis present

## 2014-01-28 DIAGNOSIS — E876 Hypokalemia: Secondary | ICD-10-CM | POA: Diagnosis present

## 2014-01-28 DIAGNOSIS — I10 Essential (primary) hypertension: Secondary | ICD-10-CM | POA: Diagnosis present

## 2014-01-28 DIAGNOSIS — I059 Rheumatic mitral valve disease, unspecified: Secondary | ICD-10-CM

## 2014-01-28 DIAGNOSIS — A4101 Sepsis due to Methicillin susceptible Staphylococcus aureus: Secondary | ICD-10-CM | POA: Diagnosis present

## 2014-01-28 LAB — CBC
HCT: 30.1 % — ABNORMAL LOW (ref 36.0–46.0)
HEMOGLOBIN: 9.9 g/dL — AB (ref 12.0–15.0)
MCH: 28.4 pg (ref 26.0–34.0)
MCHC: 32.9 g/dL (ref 30.0–36.0)
MCV: 86.5 fL (ref 78.0–100.0)
PLATELETS: 100 10*3/uL — AB (ref 150–400)
RBC: 3.48 MIL/uL — AB (ref 3.87–5.11)
RDW: 15.5 % (ref 11.5–15.5)
WBC: 5.3 10*3/uL (ref 4.0–10.5)

## 2014-01-28 LAB — BASIC METABOLIC PANEL
ANION GAP: 14 (ref 5–15)
BUN: 32 mg/dL — ABNORMAL HIGH (ref 6–23)
CALCIUM: 8.5 mg/dL (ref 8.4–10.5)
CHLORIDE: 102 meq/L (ref 96–112)
CO2: 22 mEq/L (ref 19–32)
CREATININE: 0.57 mg/dL (ref 0.50–1.10)
GFR calc Af Amer: >90 mL/min (ref >90)
GFR calc non Af Amer: >90 mL/min (ref >90)
GLUCOSE: 187 mg/dL — AB (ref 70–99)
Potassium: 3.2 mEq/L — ABNORMAL LOW (ref 3.7–5.3)
Sodium: 138 mEq/L (ref 137–147)

## 2014-01-28 LAB — IRON AND TIBC
IRON: 11 ug/dL — AB (ref 42–135)
Saturation Ratios: 9 % — ABNORMAL LOW (ref 20–55)
TIBC: 117 ug/dL — ABNORMAL LOW (ref 250–470)
UIBC: 106 ug/dL — AB (ref 125–400)

## 2014-01-28 LAB — HEMOGLOBIN A1C
HEMOGLOBIN A1C: 6.2 % — AB (ref <5.7)
MEAN PLASMA GLUCOSE: 131 mg/dL — AB (ref <117)

## 2014-01-28 LAB — TSH: TSH: 1.9 u[IU]/mL (ref 0.350–4.500)

## 2014-01-28 LAB — GLUCOSE, CAPILLARY
GLUCOSE-CAPILLARY: 167 mg/dL — AB (ref 70–99)
GLUCOSE-CAPILLARY: 132 mg/dL — AB (ref 70–99)
GLUCOSE-CAPILLARY: 148 mg/dL — AB (ref 70–99)
Glucose-Capillary: 127 mg/dL — ABNORMAL HIGH (ref 70–99)

## 2014-01-28 LAB — FOLATE: FOLATE: 5.8 ng/mL

## 2014-01-28 LAB — HEPATIC FUNCTION PANEL
ALT: 15 U/L (ref 0–35)
AST: 27 U/L (ref 0–37)
Albumin: 1.7 g/dL — ABNORMAL LOW (ref 3.5–5.2)
Alkaline Phosphatase: 109 U/L (ref 39–117)
BILIRUBIN INDIRECT: 0.6 mg/dL (ref 0.3–0.9)
Bilirubin, Direct: 1.5 mg/dL — ABNORMAL HIGH (ref 0.0–0.3)
TOTAL PROTEIN: 6.8 g/dL (ref 6.0–8.3)
Total Bilirubin: 2.1 mg/dL — ABNORMAL HIGH (ref 0.3–1.2)

## 2014-01-28 LAB — RETICULOCYTES
RBC.: 3.5 MIL/uL — AB (ref 3.87–5.11)
RETIC COUNT ABSOLUTE: 52.5 10*3/uL (ref 19.0–186.0)
Retic Ct Pct: 1.5 % (ref 0.4–3.1)

## 2014-01-28 LAB — FERRITIN: Ferritin: 1524 ng/mL — ABNORMAL HIGH (ref 10–291)

## 2014-01-28 LAB — VITAMIN B12: Vitamin B-12: 708 pg/mL (ref 211–911)

## 2014-01-28 LAB — LACTIC ACID, PLASMA: LACTIC ACID, VENOUS: 1.6 mmol/L (ref 0.5–2.2)

## 2014-01-28 LAB — MRSA PCR SCREENING: MRSA by PCR: NEGATIVE

## 2014-01-28 MED ORDER — POTASSIUM CHLORIDE 20 MEQ/15ML (10%) PO LIQD
40.0000 meq | Freq: Once | ORAL | Status: AC
  Administered 2014-01-28: 40 meq via ORAL
  Filled 2014-01-28: qty 30

## 2014-01-28 MED ORDER — SODIUM CHLORIDE 0.9 % IV BOLUS (SEPSIS)
500.0000 mL | Freq: Once | INTRAVENOUS | Status: AC
  Administered 2014-01-28: 500 mL via INTRAVENOUS

## 2014-01-28 MED ORDER — VANCOMYCIN HCL IN DEXTROSE 1-5 GM/200ML-% IV SOLN
1000.0000 mg | Freq: Two times a day (BID) | INTRAVENOUS | Status: DC
  Administered 2014-01-28 – 2014-01-31 (×8): 1000 mg via INTRAVENOUS
  Filled 2014-01-28 (×9): qty 200

## 2014-01-28 MED ORDER — GLUCERNA SHAKE PO LIQD
237.0000 mL | Freq: Two times a day (BID) | ORAL | Status: DC
  Administered 2014-01-28 – 2014-02-02 (×7): 237 mL via ORAL

## 2014-01-28 MED ORDER — ACETAMINOPHEN 325 MG PO TABS
650.0000 mg | ORAL_TABLET | ORAL | Status: DC | PRN
  Administered 2014-01-29 – 2014-02-04 (×4): 650 mg via ORAL
  Filled 2014-01-28 (×4): qty 2

## 2014-01-28 MED ORDER — MORPHINE SULFATE 2 MG/ML IJ SOLN
1.0000 mg | Freq: Once | INTRAMUSCULAR | Status: AC
  Administered 2014-01-28: 1 mg via INTRAVENOUS
  Filled 2014-01-28: qty 1

## 2014-01-28 MED ORDER — CETYLPYRIDINIUM CHLORIDE 0.05 % MT LIQD
7.0000 mL | Freq: Two times a day (BID) | OROMUCOSAL | Status: DC
  Administered 2014-01-28 – 2014-02-04 (×15): 7 mL via OROMUCOSAL

## 2014-01-28 MED ORDER — MORPHINE SULFATE 2 MG/ML IJ SOLN
0.5000 mg | INTRAMUSCULAR | Status: DC | PRN
  Administered 2014-01-28 – 2014-01-29 (×3): 1 mg via INTRAVENOUS
  Filled 2014-01-28 (×3): qty 1

## 2014-01-28 MED ORDER — PNEUMOCOCCAL VAC POLYVALENT 25 MCG/0.5ML IJ INJ
0.5000 mL | INJECTION | INTRAMUSCULAR | Status: AC
  Administered 2014-01-29: 0.5 mL via INTRAMUSCULAR
  Filled 2014-01-28: qty 0.5

## 2014-01-28 MED ORDER — INFLUENZA VAC SPLIT QUAD 0.5 ML IM SUSY
0.5000 mL | PREFILLED_SYRINGE | INTRAMUSCULAR | Status: AC
Start: 2014-01-29 — End: 2014-01-29
  Administered 2014-01-29: 0.5 mL via INTRAMUSCULAR
  Filled 2014-01-28: qty 0.5

## 2014-01-28 MED ORDER — OXYCODONE HCL 5 MG PO TABS
5.0000 mg | ORAL_TABLET | ORAL | Status: DC | PRN
  Administered 2014-01-29 – 2014-02-02 (×4): 5 mg via ORAL
  Filled 2014-01-28 (×4): qty 1

## 2014-01-28 MED ORDER — BETHANECHOL CHLORIDE 10 MG PO TABS
10.0000 mg | ORAL_TABLET | ORAL | Status: AC
  Administered 2014-01-28 (×3): 10 mg via ORAL
  Filled 2014-01-28 (×6): qty 1

## 2014-01-28 NOTE — Progress Notes (Signed)
ANTIBIOTIC CONSULT NOTE - INITIAL  Pharmacy Consult for vancomycin Indication: bacteremia  Allergies  Allergen Reactions  . Penicillins Rash    Patient Measurements: Height: 5\' 4"  (162.6 cm) Weight: 187 lb 6.3 oz (85 kg) IBW/kg (Calculated) : 54.7  Vital Signs: Temp: 98.1 F (36.7 C) (10/14 1221) Temp Source: Oral (10/14 1221) BP: 108/55 mmHg (10/14 0836) Pulse Rate: 121 (10/14 0700) Intake/Output from previous day: 10/13 0701 - 10/14 0700 In: 1750 [I.V.:750; IV Piggyback:1000] Out: -  Intake/Output from this shift: Total I/O In: 762.6 [P.O.:120; I.V.:642.6] Out: -   Labs:  Recent Labs  01/27/14 1820 01/28/14 0123  WBC 4.5 5.3  HGB 11.4* 9.9*  PLT 105* 100*  CREATININE 0.44* 0.57   Estimated Creatinine Clearance: 79.8 ml/min (by C-G formula based on Cr of 0.57). No results found for this basename: VANCOTROUGH, 01/30/14, VANCORANDOM, GENTTROUGH, GENTPEAK, GENTRANDOM, TOBRATROUGH, TOBRAPEAK, TOBRARND, AMIKACINPEAK, AMIKACINTROU, AMIKACIN,  in the last 72 hours   Microbiology: Recent Results (from the past 720 hour(s))  CULTURE, BLOOD (ROUTINE X 2)     Status: None   Collection Time    01/27/14  6:20 PM      Result Value Ref Range Status   Specimen Description BLOOD LEFT ARM   Final   Special Requests BOTTLES DRAWN AEROBIC AND ANAEROBIC 10CC   Final   Culture  Setup Time     Final   Value: 01/27/2014 22:12     Performed at 01/29/2014   Culture     Final   Value: GRAM POSITIVE COCCI IN CLUSTERS     Note: Gram Stain Report Called to,Read Back By and Verified With: STEPHANIE SHAW 01/28/14 0855 BY SMITHERSJ     Performed at 01/30/14   Report Status PENDING   Incomplete  CULTURE, BLOOD (ROUTINE X 2)     Status: None   Collection Time    01/27/14  6:30 PM      Result Value Ref Range Status   Specimen Description BLOOD LEFT HAND   Final   Special Requests BOTTLES DRAWN AEROBIC AND ANAEROBIC 10CC   Final   Culture  Setup Time     Final   Value: 01/27/2014 22:11     Performed at 01/29/2014   Culture     Final   Value: GRAM POSITIVE COCCI IN CLUSTERS     Note: Gram Stain Report Called to,Read Back By and Verified With: STEPHANIE SHAW 01/28/14 0855 BY SMITHERSJ     Performed at 01/30/14   Report Status PENDING   Incomplete  MRSA PCR SCREENING     Status: None   Collection Time    01/27/14 11:18 PM      Result Value Ref Range Status   MRSA by PCR NEGATIVE  NEGATIVE Final   Comment:            The GeneXpert MRSA Assay (FDA     approved for NASAL specimens     only), is one component of a     comprehensive MRSA colonization     surveillance program. It is not     intended to diagnose MRSA     infection nor to guide or     monitor treatment for     MRSA infections.    Medical History: Past Medical History  Diagnosis Date  . Hypertension   . Diabetes mellitus without complication   . Arthritis     Medications:  Prescriptions prior to admission  Medication Sig Dispense Refill  . naproxen sodium (ANAPROX) 220 MG tablet Take 440 mg by mouth 2 (two) times daily as needed (for pain).      . Throat Lozenges (COUGH DROPS MT) Use as directed 1 lozenge in the mouth or throat daily as needed (for cough).       Assessment: 58 year old woman who presented to [facility] yesterday with weakness, fever and severe hip and knee pain.  Patient was initially started on ceftriaxone for presumed UTI.  Blood cultures now growing gram positive cocci in 2 sets.  Goal of Therapy:  Vancomycin trough level 15-20 mcg/ml  Plan:  Measure antibiotic drug levels at steady state Follow up culture results Vancomycin 1g IV q12 Continue to follow renal function  Mickeal Skinner 01/28/2014,1:52 PM

## 2014-01-28 NOTE — Progress Notes (Signed)
  Echocardiogram 2D Echocardiogram has been performed.  [name] 01/28/2014, 11:46 AM

## 2014-01-28 NOTE — Progress Notes (Signed)
Lab called with blood culture results.  Gram positive cocci in clusters.  Reported to Junious Silk, RN.  No new orders received.  Will continue to monitor.

## 2014-01-28 NOTE — Care Management Note (Signed)
    Page 1 of 1   01/28/2014     10:04:39 AM CARE MANAGEMENT NOTE 01/28/2014  Patient:  Robin Schroeder, Robin Schroeder   Account Number:  [id number]  Date Initiated:  01/28/2014  Documentation initiated by:  Loralai Eisman  Subjective/Objective Assessment:   dx sepsis; lives with spouse     In-house referral  Clinical Social Worker      DC Associate Professor  CM consult      Status of service:  In process, will continue to follow  Per UR Regulation:  Reviewed for med. necessity/level of care/duration of stay  Comments:  01/28/2014 0930 Sylver Vantassell RN MSN BSN CCM Per spouse, pt has been refusing to bathe, eat, or walk. States he wants placement for her.  Discussed reimbursement for facility as pt has no insurance.  He states he will pay privately.  CSW notified.

## 2014-01-28 NOTE — Progress Notes (Signed)
[facility]  Robin Schroeder:[id number] DOB: 1956-04-01 DOA: 01/27/2014 PCP: No PCP Per Patient  Brief narrative: 58 year old female who presented to the emergency department with complaints of generalized weakness. Patient has severe knee and hip pain and has experienced functional decline over the past few months with symptoms being worse in the past 4 days. She is also noticed fevers, chills, and  foul-smelling urine over this same time period.  Upon presentation to the ER she was tachycardic with a heart rate of 140, tachypneic with respiratory rates in the 30s. Her rectal temperature was 103 but she was normotensive. Chest x-ray was concerning for possible pulmonary venous congestion as well as bilateral infrahilar atelectasis but the film was also quite rotated. Patient's white count was normal at 4500 at presentation but she did have a left shift with neutrophils 86%. She appeared to be quite dehydrated with a lactic acid of 2.68 in setting of normal BUN and creatinine as well as a sodium of 136. A urinalysis was concerning for urinary tract infection. Urine culture and blood cultures were also obtained and empiric abx began by the emergency room physician.  Since admission patient reports that several days prior to admission she had made an attempt to ambulate to the bathroom but fell so basically returned to her bed and would not ambulate after that point. Based on her descriptions it seems she may have been incontinent in bed because of fear of falling. Her husband has approached the case manager and was requesting placement upon discharge pending PT and OT evaluations. Unfortunately the patient does not have any long-term care insurance so this option will need to be explored.  HPI/Subjective: Patient awake and with any attempts to move her in the bed literally screaming out and complaining of severe pain in both knees. Currently denies chest pain  or shortness of breath at rest. Tells me she is not on any medications for blood pressure or sugar at home. States she mainly takes Aleve for her knee pain. Denies issues with abdominal discomfort or dark or bloody stools.  Assessment/Plan:  Sepsis due to UTI w/ bacteremia  Source at this point clarified as bacteremia possibly from UTI - followup on final blood cultures as well as urine culture - continue supportive care - had issues with hypotension after admission but this has improved with hydration - lactic acid has decreased to 1.6 since admission  UTI / acute urinary retention Urine strongly positive but culture pending - continue empiric therapy - attempted to insert foley catheter since had acute urinary retention after admission w/ greater than 600 cc retained urine noted on bladder scan, but urethral meatus was not able to be located - w/ normal creatine and abscence of sx, will monitor output w/o foley for now at her request, but if pt unable to pass urine spontaneously within next few hours, will need to ask foley cath team to attempt   Gram-positive bacteremia - 2/2 blood cx  Source possibly urine - continue broad-spectrum empiric antibiotic - follow speciation   Diabetes mellitus, type 2 A1c in January was 7.4 and repeat is pending this admission - CBGs well controlled - was not taking medications prior to admission - continue sliding scale insulin for now  Physical deconditioning / Severe Osteoarthritis of both knees Mobility limited by severe pain - patient has been given morphine and OxyIR this admission which seems to be helping - continue NSAIDs cautiously -  once patient more stable from an infectious processes obtain PT and OT evaluations - husband has been requesting patient be placed in skilled nursing facility upon discharge; case manager working with husband to determine if financially able to pursue this option  Obesity (BMI 30.0-34.9) Likely contributing to patient's  osteoarthritis symptoms  Chronic diastolic heart failure, NYHA class 1 Compensated - new finding on echo this admission  Hypokalemia Oral replete - follow labs  DVT prophylaxis: Subcutaneous heparin Code Status: Full Family Communication: Husband not at bedside during my exam but has communicated with the case manager regarding discharge planning Disposition Plan/Expected LOS: Stepdown - probable transfer to med bed in AM  Consultants: None  Procedures: 2-D echocardiogram: - Left ventricle: There is a false tendon in the LV apex of no clinical significance. The cavity size was normal. There was mild concentric hypertrophy. Systolic function was normal. The estimated ejection fraction was in the range of 55% to 60%. Wall motion was normal; there were no regional wall motion abnormalities. There was an increased relative contribution of atrial contraction to ventricular filling. Doppler parameters are consistent with abnormal left ventricular relaxation (grade 1 diastolic dysfunction). - Mitral valve: There was mild regurgitation. - Pulmonary arteries: PA peak pressure: 37 mm Hg (S) consistent with mild pulmonary hypertension   Antibiotics: Rocephin 10/13  Vancomycin 10/14 >  Objective: Blood pressure 108/55, pulse 121, temperature 98.1 F (36.7 C), temperature source Oral, resp. rate 35, height 5\' 4"  (1.626 m), weight 187 lb 6.3 oz (85 kg), SpO2 97.00%.  Intake/Output Summary (Last 24 hours) at 01/28/14 1317 Last data filed at 01/28/14 1300  Gross per 24 hour  Intake 2512.58 ml  Output      0 ml  Net 2512.58 ml   Exam: Gen: No acute respiratory distress - alert and conversant  Chest: Clear to auscultation bilaterally without wheezes, rhonchi or crackles, room air Cardiac: Regular rate and rhythm, S1-S2, no rubs murmurs or gallops, no peripheral edema, no JVD Abdomen: Soft nontender nondistended without obvious hepatosplenomegaly, no ascites Extremities: Symmetrical in  appearance without cyanosis, clubbing or effusion but knees are very tender to touch noting no erythematous changes to skin above knees/no clinical findings to suggest septic arthritis   Scheduled Meds:  Scheduled Meds: . antiseptic oral rinse  7 mL Mouth Rinse BID  . cefTRIAXone (ROCEPHIN)  IV  1 g Intravenous Q24H  . heparin  5,000 Units Subcutaneous 3 times per day  . [START ON 01/29/2014] Influenza vac split quadrivalent PF  0.5 mL Intramuscular Tomorrow-1000  . insulin aspart  0-15 Units Subcutaneous TID WC  . meloxicam  15 mg Oral Daily  . [START ON 01/29/2014] pneumococcal 23 valent vaccine  0.5 mL Intramuscular Tomorrow-1000  . sodium chloride  3 mL Intravenous Q12H    Data Reviewed: Basic Metabolic Panel:  Recent Labs Lab 01/27/14 1820 01/28/14 0123  NA 136* 138  K 3.7 3.2*  CL 101 102  CO2 21 22  GLUCOSE 199* 187*  BUN 22 32*  CREATININE 0.44* 0.57  CALCIUM 8.8 8.5   Liver Function Tests:  Recent Labs Lab 01/27/14 2342  AST 27  ALT 15  ALKPHOS 109  BILITOT 2.1*  PROT 6.8  ALBUMIN 1.7*   CBC:  Recent Labs Lab 01/27/14 1820 01/28/14 0123  WBC 4.5 5.3  NEUTROABS 3.9  --   HGB 11.4* 9.9*  HCT 34.2* 30.1*  MCV 88.1 86.5  PLT 105* 100*   CBG:  Recent Labs Lab 01/27/14 2203 01/28/14 0835  01/28/14 1223  GLUCAP 189* 167* 127*    Recent Results (from the past 240 hour(s))  CULTURE, BLOOD (ROUTINE X 2)     Status: None   Collection Time    01/27/14  6:20 PM      Result Value Ref Range Status   Specimen Description BLOOD LEFT ARM   Final   Special Requests BOTTLES DRAWN AEROBIC AND ANAEROBIC 10CC   Final   Culture  Setup Time     Final   Value: 01/27/2014 22:12     Performed at [facility]   Culture     Final   Value: GRAM POSITIVE COCCI IN CLUSTERS     Note: Gram Stain Report Called to,Read Back By and Verified With: STEPHANIE SHAW 01/28/14 0855 BY SMITHERSJ     Performed at [facility]   Report Status PENDING    Incomplete  CULTURE, BLOOD (ROUTINE X 2)     Status: None   Collection Time    01/27/14  6:30 PM      Result Value Ref Range Status   Specimen Description BLOOD LEFT HAND   Final   Special Requests BOTTLES DRAWN AEROBIC AND ANAEROBIC 10CC   Final   Culture  Setup Time     Final   Value: 01/27/2014 22:11     Performed at [facility]   Culture     Final   Value: GRAM POSITIVE COCCI IN CLUSTERS     Note: Gram Stain Report Called to,Read Back By and Verified With: STEPHANIE SHAW 01/28/14 0855 BY SMITHERSJ     Performed at [facility]   Report Status PENDING   Incomplete  MRSA PCR SCREENING     Status: None   Collection Time    01/27/14 11:18 PM      Result Value Ref Range Status   MRSA by PCR NEGATIVE  NEGATIVE Final   Comment:            The GeneXpert MRSA Assay (FDA     approved for NASAL specimens     only), is one component of a     comprehensive MRSA colonization     surveillance program. It is not     intended to diagnose MRSA     infection nor to guide or     monitor treatment for     MRSA infections.     Studies:  Recent x-ray studies have been reviewed in detail by the Attending Physician  Time spent :  35 mins  Junious Silk, ANP [facility] Office  [phone] Pager [phone]  On-Call/Text Page:      [email or web address]      password [email or web address]  If 7PM-7AM, please contact night-coverage [email or web address] Password [email or web address] 01/28/2014, 1:17 PM   LOS: 1 day   I have personally examined this patient and reviewed the entire database. I have reviewed the above note, made any necessary editorial changes, and agree with its content.  Lonia Blood, MD [facility]

## 2014-01-28 NOTE — Progress Notes (Signed)
CRITICAL VALUE ALERT  Critical value received:  Blood cultures - gram positive cocci in clusters  Date of notification:  01/28/14  Time of notification:  0835  Critical value read back:Yes.    Nurse who received alert:  Nicole Cella, RN  MD notified (1st page):  Junious Silk. NP  Time of first page:  [phone]  MD notified (2nd page):  Time of second page:  Responding MD:  Junious Silk, NP  Time MD responded:  [phone]

## 2014-01-28 NOTE — Progress Notes (Signed)
Attempted to insert foley.  Unable to locate urethra.  MD notified. New orders placed.  Patient is stable at this time.  Will continue to monitor.

## 2014-01-28 NOTE — Progress Notes (Signed)
INITIAL NUTRITION ASSESSMENT  DOCUMENTATION CODES Per approved criteria  -Obesity Unspecified   INTERVENTION: Glucerna Shake po BID, each supplement provides 220 kcal and 10 grams of protein  NUTRITION DIAGNOSIS: Inadequate oral intake related to decreased appetite as evidenced by diet hx, poor po intake x 1 week.   Goal: Pt will meet >90% of estimated nutritional needs  Monitor:  PO intake, labs, weight changes, I/O's  Reason for Assessment: MST, low braden  58 y.o. female  Admitting Dx: <principal problem not specified>  58 year old female who presented to the emergency department with complaints of generalized weakness. Patient has severe knee and hip pain and has experienced functional decline over the past few months with symptoms being worse in the past 4 days. She is also noticed fevers chills foul-smelling urine over this same time period.  ASSESSMENT: Pt reports poor appetite for approximately one week, due to illness. Pt reports she ate "a little bit" of lunch today- a few bites of fruit and baked ziti. She denies any difficulty chewing or swallowing. Prior to this week, she reveals her appetite is fairly good. Diet recall PTA is as follows: Breakfast: fruit (orange, tangerine, or banana); Lunch: fried chicken and salad OR pizza; Dinner: tomato slices. She denies snacking between meals or at night. Beverages consist of only water. Her husband does most of the food preparation at home. Per pt, she eliminated sugar sweetened and caffeinated beverages in January 2015, when she was diagnosed with DM and HTN at a local urgent care clinic.  Pt reports she has lost weight, however, difficult to determine intentional vs unintentional wt loss. No wt hx available in EPIC. Pt reports she weighed 314# back in 2006, however, has decreased portions and tried to make healthier food choices, as she expressed that her obesity was affecting her health. She expressed concern over prevalence of  heart disease in her family. She reports that she knows she has lost weight, as her rings are too large to fit securely on her fingers.  Educated pt on importance of PO intake to promote healing process. Pt is agreeable to try supplements during her hospitalization. Given suboptimal Hgb A1c levels and high sodium food choices in diet hx, pt caregivers may benefit from diet education if pt is discharged back home.  Pt husband desires placement for pt pending PT/OT evaluations. Case Management following to determine if this is viable option, as pt is uninsured.  Labs reviewed. K: 3.2. BUN: 32. Glucose: 187, CBGs: 127-298 (trending down). Most recent Hgb A1c: 7.4 back in 04/2013. Pt does not take any medications for DM PTA; currently receiving insulin as an inpatient.   Nutrition Focused Physical Exam:  Subcutaneous Fat:  Orbital Region: mild depletion Upper Arm Region: WDL Thoracic and Lumbar Region: WDL  Muscle:  Temple Region: WDL Clavicle Bone Region: WDL Clavicle and Acromion Bone Region: WDL Scapular Bone Region: WDL Dorsal Hand: WDL Patellar Region: WDL Anterior Thigh Region: WDL Posterior Calf Region: WDL  Edema: none present  Height: Ht Readings from Last 1 Encounters:  01/27/14 5\' 4"  (1.626 m)    Weight: Wt Readings from Last 1 Encounters:  01/28/14 187 lb 6.3 oz (85 kg)    Ideal Body Weight: 120#  % Ideal Body Weight: 156%  Wt Readings from Last 10 Encounters:  01/28/14 187 lb 6.3 oz (85 kg)    Usual Body Weight: unknown  % Usual Body Weight: unknown  BMI:  Body mass index is 32.15 kg/(m^2). Obesity, class I  Estimated  Nutritional Needs: Kcal: 1900-2100 Protein: 85-95 grams Fluid: 1.9-2.1 L  Skin: open area between buttocks, MASD on lt and rt groin  Diet Order: Carb Control  EDUCATION NEEDS: -Education needs addressed   Intake/Output Summary (Last 24 hours) at 01/28/14 1423 Last data filed at 01/28/14 1300  Gross per 24 hour  Intake 2512.58 ml   Output      0 ml  Net 2512.58 ml    Last BM: PTA  Labs:   Recent Labs Lab 01/27/14 1820 01/28/14 0123  NA 136* 138  K 3.7 3.2*  CL 101 102  CO2 21 22  BUN 22 32*  CREATININE 0.44* 0.57  CALCIUM 8.8 8.5  GLUCOSE 199* 187*    CBG (last 3)   Recent Labs  01/27/14 2203 01/28/14 0835 01/28/14 1223  GLUCAP 189* 167* 127*   Lab Results  Component Value Date   HGBA1C 7.4 05/09/2013   Scheduled Meds: . antiseptic oral rinse  7 mL Mouth Rinse BID  . heparin  5,000 Units Subcutaneous 3 times per day  . [START ON 01/29/2014] Influenza vac split quadrivalent PF  0.5 mL Intramuscular Tomorrow-1000  . insulin aspart  0-15 Units Subcutaneous TID WC  . meloxicam  15 mg Oral Daily  . [START ON 01/29/2014] pneumococcal 23 valent vaccine  0.5 mL Intramuscular Tomorrow-1000  . sodium chloride  3 mL Intravenous Q12H  . vancomycin  1,000 mg Intravenous Q12H    Continuous Infusions: . sodium chloride 125 mL/hr at 01/28/14 1011    Past Medical History  Diagnosis Date  . Hypertension   . Diabetes mellitus without complication   . Arthritis     History reviewed. No pertinent past surgical history.  Kordel Leavy A. Mayford Knife, RD, LDN Pager: [phone] After hours Pager: [phone]

## 2014-01-29 DIAGNOSIS — B9620 Unspecified Escherichia coli [E. coli] as the cause of diseases classified elsewhere: Secondary | ICD-10-CM

## 2014-01-29 DIAGNOSIS — M170 Bilateral primary osteoarthritis of knee: Secondary | ICD-10-CM

## 2014-01-29 DIAGNOSIS — I5032 Chronic diastolic (congestive) heart failure: Secondary | ICD-10-CM

## 2014-01-29 DIAGNOSIS — M25561 Pain in right knee: Secondary | ICD-10-CM

## 2014-01-29 DIAGNOSIS — B958 Unspecified staphylococcus as the cause of diseases classified elsewhere: Secondary | ICD-10-CM

## 2014-01-29 DIAGNOSIS — M25562 Pain in left knee: Secondary | ICD-10-CM

## 2014-01-29 LAB — CBC
HEMATOCRIT: 31.4 % — AB (ref 36.0–46.0)
Hemoglobin: 9.9 g/dL — ABNORMAL LOW (ref 12.0–15.0)
MCH: 27.7 pg (ref 26.0–34.0)
MCHC: 31.5 g/dL (ref 30.0–36.0)
MCV: 87.7 fL (ref 78.0–100.0)
PLATELETS: 84 10*3/uL — AB (ref 150–400)
RBC: 3.58 MIL/uL — ABNORMAL LOW (ref 3.87–5.11)
RDW: 16 % — AB (ref 11.5–15.5)
WBC: 2.2 10*3/uL — ABNORMAL LOW (ref 4.0–10.5)

## 2014-01-29 LAB — COMPREHENSIVE METABOLIC PANEL
ALBUMIN: 1.5 g/dL — AB (ref 3.5–5.2)
ALT: 15 U/L (ref 0–35)
ANION GAP: 10 (ref 5–15)
AST: 33 U/L (ref 0–37)
Alkaline Phosphatase: 92 U/L (ref 39–117)
BILIRUBIN TOTAL: 1.2 mg/dL (ref 0.3–1.2)
BUN: 20 mg/dL (ref 6–23)
CHLORIDE: 110 meq/L (ref 96–112)
CO2: 22 mEq/L (ref 19–32)
CREATININE: 0.38 mg/dL — AB (ref 0.50–1.10)
Calcium: 8.5 mg/dL (ref 8.4–10.5)
GFR calc non Af Amer: >90 mL/min (ref >90)
Glucose, Bld: 143 mg/dL — ABNORMAL HIGH (ref 70–99)
POTASSIUM: 3.9 meq/L (ref 3.7–5.3)
Sodium: 142 mEq/L (ref 137–147)
Total Protein: 6.2 g/dL (ref 6.0–8.3)

## 2014-01-29 LAB — GLUCOSE, CAPILLARY
GLUCOSE-CAPILLARY: 122 mg/dL — AB (ref 70–99)
GLUCOSE-CAPILLARY: 147 mg/dL — AB (ref 70–99)
Glucose-Capillary: 228 mg/dL — ABNORMAL HIGH (ref 70–99)
Glucose-Capillary: 153 mg/dL — ABNORMAL HIGH (ref 70–99)

## 2014-01-29 MED ORDER — CIPROFLOXACIN IN D5W 400 MG/200ML IV SOLN
400.0000 mg | Freq: Two times a day (BID) | INTRAVENOUS | Status: DC
  Administered 2014-01-29: 400 mg via INTRAVENOUS
  Filled 2014-01-29 (×3): qty 200

## 2014-01-29 NOTE — Consult Note (Addendum)
[facility]     Reason for Consult: Staph bacteremia    Referring Physician: [facility]/Dr. Joseph Art  Active Problems:   Obesity (BMI 30.0-34.9)   Sepsis   UTI (lower urinary tract infection)   DM2 (diabetes mellitus, type 2)   Gram-positive bacteremia   Chronic diastolic heart failure, NYHA class 1   Physical deconditioning   Severe Osteoarthritis of both knees   Hypokalemia   HTN (hypertension)   . antiseptic oral rinse  7 mL Mouth Rinse BID  . ciprofloxacin  400 mg Intravenous Q12H  . feeding supplement (GLUCERNA SHAKE)  237 mL Oral BID BM  . heparin  5,000 Units Subcutaneous 3 times per day  . insulin aspart  0-15 Units Subcutaneous TID WC  . meloxicam  15 mg Oral Daily  . vancomycin  1,000 mg Intravenous Q12H    Recommendations: Continue with vancomycin  Will need TEE Repeat blood cultures D/c cipro  Assessment: Staph aureus bacteremia. TTE negative.  Also with ? UTI but she tells me she was asymptomatic.  I don't see any report of symptoms and this may just be asymptomatic bacteruria with E coli.  Minimal WBC on initial UA.  I will d/c cipro.    Antibiotics: Vancomycin and cipro  HPI: Robin Schroeder is a 58 y.o. female with several months of decline and particular weakness, malaise, fever, chills for about 4 days prior to admission.  Initial work up suggested urine as source with a few WBCs but blood cultures now positive for Staph aureus 2/2.  She feels better since admission.  Vancomycin had already been started.     Review of Systems: A comprehensive review of systems was negative.  Past Medical History  Diagnosis Date  . Hypertension   . Diabetes mellitus without complication   . Arthritis     History  Substance Use Topics  . Smoking status: Never Smoker   . Smokeless tobacco: Not on file  . Alcohol Use: Not on file    Family History  Problem Relation Age of Onset  . Hypertension Brother    Allergies  Allergen  Reactions  . Penicillins Rash    OBJECTIVE: Blood pressure 113/51, pulse 104, temperature 98.3 F (36.8 C), temperature source Oral, resp. rate 21, height 5\' 4"  (1.626 m), weight 187 lb 6.3 oz (85 kg), SpO2 98.00%. General: awake, alert, nad Skin: no rashes Lungs: CTA B Cor: RRR Abdomen: soft, nt, nd Ext: no edema  Microbiology: Recent Results (from the past 240 hour(s))  URINE CULTURE     Status: None   Collection Time    01/27/14  5:55 PM      Result Value Ref Range Status   Specimen Description URINE, CATHETERIZED   Final   Special Requests Normal   Final   Culture  Setup Time     Final   Value: 01/27/2014 22:18     Performed at 01/29/2014 Count     Final   Value: >=100,000 COLONIES/ML     Performed at [facility]   Culture     Final   Value: ESCHERICHIA COLI     Performed at [facility]   Report Status PENDING   Incomplete  CULTURE, BLOOD (ROUTINE X 2)     Status: None   Collection Time    01/27/14  6:20 PM      Result Value Ref Range Status   Specimen Description BLOOD LEFT ARM  Final   Special Requests BOTTLES DRAWN AEROBIC AND ANAEROBIC 10CC   Final   Culture  Setup Time     Final   Value: 01/27/2014 22:12     Performed at [facility]   Culture     Final   Value: STAPHYLOCOCCUS AUREUS     Note: RIFAMPIN AND GENTAMICIN SHOULD NOT BE USED AS SINGLE DRUGS FOR TREATMENT OF STAPH INFECTIONS.     Note: Gram Stain Report Called to,Read Back By and Verified With: STEPHANIE SHAW 01/28/14 0855 BY SMITHERSJ     Performed at [facility]   Report Status PENDING   Incomplete  CULTURE, BLOOD (ROUTINE X 2)     Status: None   Collection Time    01/27/14  6:30 PM      Result Value Ref Range Status   Specimen Description BLOOD LEFT HAND   Final   Special Requests BOTTLES DRAWN AEROBIC AND ANAEROBIC 10CC   Final   Culture  Setup Time     Final   Value: 01/27/2014 22:11     Performed at [facility]   Culture      Final   Value: STAPHYLOCOCCUS AUREUS     Note: Gram Stain Report Called to,Read Back By and Verified With: STEPHANIE SHAW 01/28/14 0855 BY SMITHERSJ     Performed at [facility]   Report Status PENDING   Incomplete  MRSA PCR SCREENING     Status: None   Collection Time    01/27/14 11:18 PM      Result Value Ref Range Status   MRSA by PCR NEGATIVE  NEGATIVE Final   Comment:            The GeneXpert MRSA Assay (FDA     approved for NASAL specimens     only), is one component of a     comprehensive MRSA colonization     surveillance program. It is not     intended to diagnose MRSA     infection nor to guide or     monitor treatment for     MRSA infections.    Staci Righter, MD [facility] [email or web address] [phone] pager  [phone] cell 01/29/2014, 3:56 PM

## 2014-01-29 NOTE — Progress Notes (Signed)
Report called to Olegario Messier, RN on 6E.  Updated on patient history, current status, and plan of care.  Patient is stable and able to transfer at this time.

## 2014-01-29 NOTE — Progress Notes (Signed)
PT Cancellation Note  Patient Details Name: Robin Schroeder MRN: [id number] DOB: January 18, 1956   Cancelled Treatment:    Reason Eval/Treat Not Completed: Patient declined, no reason specified.  Pt adamantly declining mobility at this time stating she doesn't feel ready.  Max encouragement for participation, however pt continues to decline.  Will try back another time.     Carmen Tolliver, Alison Murray 01/29/2014, 10:44 AM

## 2014-01-29 NOTE — Clinical Social Work Psychosocial (Signed)
Clinical Social Work Department BRIEF PSYCHOSOCIAL ASSESSMENT 01/29/2014  Patient:  Robin Schroeder, Robin Schroeder     Account Number:  [id number]     Admit date:  01/27/2014  Clinical Social Worker:  Merlyn Lot, CLINICAL SOCIAL WORKER  Date/Time:  01/29/2014 01:40 PM  Referred by:  RN  Date Referred:  01/29/2014 Referred for  SNF Placement   Other Referral:   Interview type:  Patient Other interview type:   husband also at bedside    PSYCHOSOCIAL DATA Living Status:  HUSBAND Admitted from facility:   Level of care:   Primary support name:  Zamzam Whinery Primary support relationship to patient:  SPOUSE Degree of support available:   Unclear level of support.  Patient and husband live together and husband reports being concerned after latest fall and encouraging the patient to seek medical attention despite lack of insurance. Patient and husband have been married 18 years, husband is from [place], patient is from [place]. Some concern from ED reports which stated that patient had been unbathed for a year.  Patient also commented while CSW was in the room that husband was only concerned about getting insurance for himself.    CURRENT CONCERNS Current Concerns  Post-Acute Placement   Other Concerns:    SOCIAL WORK ASSESSMENT / PLAN CSW spoke with patient and patients husband at bedside. Patient's husband stated that he is a handicap airport transporter and only makes 22,000 a year while patient has been unemployed for over a year now due to chronic knee pain.  Patient and patients husband feel as if it is unsafe for patient to return home after this most recent fall and her lack of mobility at the moment.  Patients husband is interested in insurance options or any financial aid that can be given.   Assessment/plan status:  Psychosocial Support/Ongoing Assessment of Needs Other assessment/ plan:   Information/referral to community resources:   [employer] financial counseling     PATIENT'S/FAMILY'S RESPONSE TO PLAN OF CARE: Patient and patients husband are agreeable to SNF bed search and to CSW referral to financial counseling.       Merlyn Lot, LCSWA Clinical Social Worker [phone]

## 2014-01-29 NOTE — Progress Notes (Signed)
[facility]  Robin Schroeder YOK:[id number] DOB: [birth year] DOA: 01/27/2014 PCP: No PCP Per Patient  Brief narrative: 58 year old female who presented to the emergency department with complaints of generalized weakness. Patient has severe knee and hip pain and has experienced functional decline over the past few months with symptoms being worse in the past 4 days. She is also noticed fevers, chills, and  foul-smelling urine over this same time period.  Upon presentation to the ER she was tachycardic with a heart rate of 140, tachypneic with respiratory rates in the 30s. Her rectal temperature was 103 but she was normotensive. Chest x-ray was concerning for possible pulmonary venous congestion as well as bilateral infrahilar atelectasis but the film was also quite rotated. Patient's white count was normal at 4500 at presentation but she did have a left shift with neutrophils 86%. She appeared to be quite dehydrated with a lactic acid of 2.68 in setting of normal BUN and creatinine as well as a sodium of 136. A urinalysis was concerning for urinary tract infection. Urine culture and blood cultures were also obtained and empiric abx began by the emergency room physician.  Since admission patient reports that several days prior to admission she had made an attempt to ambulate to the bathroom but fell so basically returned to her bed and would not ambulate after that point. Based on her descriptions it seems she may have been incontinent in bed because of fear of falling. Her husband has approached the case manager and was requesting placement upon discharge pending PT and OT evaluations. Unfortunately the patient does not have any long-term care insurance so this option will need to be explored.  HPI/Subjective: Alert and endorsing improved knee pain with use of morphine. More alert animated today. States feels better after Foley catheter placed. Appetite improving.  No chest pain or shortness of breath  Assessment/Plan:  Sepsis due to Escherichia coli UTI and staph aureus bacteremia  Source at this point clarified as bacteremia and Escherichia coli UTI - followup on final blood culture sensitivities as well as urine culture -adjust antibiotics based on culture findings- continue supportive care - had issues with hypotension after admission but this has improved with hydration - lactic acid has decreased to 1.6 since admission-remains hemodynamically stable  Escherichia coli UTI / acute urinary retention Urine culture sensitivities pending - Rocephin discontinued 10/14 in favor of vancomycin to cover gram-positive bacteremia but now urine culture returned positive for gram-negative Escherichia coli so will begin Cipro- 10/14 attempted to insert foley catheter for acute urinary retention after admission w/ greater than 600 cc retained urine noted on bladder scan, but urethral meatus was not able to be located -later catheter able to be inserted successfully  Staph Aureus bacteremia - 2/2 blood cx  Source unclear-able to examine all skin surfaces except for back and buttocks this morning and no obvious lesions -dentition is poor but no obvious abscesses or dental carries-patient denies ear pain or other symptoms that would be suggestive of ear infection -continue broad-spectrum empiric antibiotic - follow speciation  -Will require TEE dependent upon species  Diabetes mellitus, type 2 A1c in January was 7.4 and repeat this admission is 6.2 - CBGs well controlled - was not taking medications prior to admission - continue sliding scale insulin for now-likely can remain diet controlled at home  Physical deconditioning / Severe Osteoarthritis of both knees Mobility limited by severe pain - patient has been given morphine  and OxyIR this admission which seems to be helping - continue NSAIDs cautiously - obtain PT and OT evaluations - husband has been requesting patient  be placed in skilled nursing facility upon discharge; case manager working with husband to determine if financially able to pursue this option -Upon discharge refer to orthopedic surgery for pain control, workup for bilateral TKA  Obesity (BMI 30.0-34.9) Likely contributing to patient's osteoarthritis symptoms  Chronic diastolic heart failure, NYHA class 1 Compensated - new finding on echo this admission  Hypokalemia Oral replete - follow labs    DVT prophylaxis: Subcutaneous heparin Code Status: Full Family Communication: Husband at bedside Disposition Plan/Expected LOS: Transfer to floor  Consultants: Dr. Staci Righter (ID)   Procedures: 10/14 2-D echocardiogram: - Left ventricle:  Mild concentric hypertrophy. -LVEF= 55% to 60%. - (grade 1 diastolic dysfunction). - Pulmonary arteries: PA peak pressure: 37 mm Hg (S) consistent with mild pulmonary hypertension  10/14 bilateral knees;Severe narrowing of all 3 compartments. Severe osteophyte formation    Cultures 10/13 urine positive Escherichia coli 10/13 blood left arm/hand positive staph aureus 10/13 MRSA by PCR negative   Antibiotics: Rocephin 10/13 >10/14 Vancomycin 10/14 > Cipro 10/15 >    Objective: Blood pressure 106/55, pulse 96, temperature 98.3 F (36.8 C), temperature source Oral, resp. rate 17, height 5\' 4"  (1.626 m), weight 187 lb 6.3 oz (85 kg), SpO2 97.00%.  Intake/Output Summary (Last 24 hours) at 01/29/14 1050 Last data filed at 01/29/14 0800  Gross per 24 hour  Intake 3127.09 ml  Output    715 ml  Net 2412.09 ml   Exam: Gen: No acute respiratory distress - alert and conversant  Chest: Clear to auscultation bilaterally, room air Cardiac: Regular rate and rhythm, S1-S2, no rubs murmurs or gallops, no peripheral edema, no JVD-IV fluid at 125 per hour Abdomen: Soft nontender nondistended without obvious hepatosplenomegaly, no ascites Extremities: Symmetrical in appearance without cyanosis,  positive bilateral effusion,knees are very tender to touch noting no erythematous changes to skin above knees/no clinical findings to suggest septic arthritis   Scheduled Meds:  Scheduled Meds: . antiseptic oral rinse  7 mL Mouth Rinse BID  . ciprofloxacin  400 mg Intravenous Q12H  . feeding supplement (GLUCERNA SHAKE)  237 mL Oral BID BM  . heparin  5,000 Units Subcutaneous 3 times per day  . Influenza vac split quadrivalent PF  0.5 mL Intramuscular Tomorrow-1000  . insulin aspart  0-15 Units Subcutaneous TID WC  . meloxicam  15 mg Oral Daily  . pneumococcal 23 valent vaccine  0.5 mL Intramuscular Tomorrow-1000  . vancomycin  1,000 mg Intravenous Q12H    Data Reviewed: Basic Metabolic Panel:  Recent Labs Lab 01/27/14 1820 01/28/14 0123 01/29/14 0342  NA 136* 138 142  K 3.7 3.2* 3.9  CL 101 102 110  CO2 21 22 22   GLUCOSE 199* 187* 143*  BUN 22 32* 20  CREATININE 0.44* 0.57 0.38*  CALCIUM 8.8 8.5 8.5   Liver Function Tests:  Recent Labs Lab 01/27/14 2342 01/29/14 0342  AST 27 33  ALT 15 15  ALKPHOS 109 92  BILITOT 2.1* 1.2  PROT 6.8 6.2  ALBUMIN 1.7* 1.5*   CBC:  Recent Labs Lab 01/27/14 1820 01/28/14 0123 01/29/14 0342  WBC 4.5 5.3 2.2*  NEUTROABS 3.9  --   --   HGB 11.4* 9.9* 9.9*  HCT 34.2* 30.1* 31.4*  MCV 88.1 86.5 87.7  PLT 105* 100* 84*   CBG:  Recent Labs Lab 01/28/14 0835 01/28/14  1223 01/28/14 1712 01/28/14 2131 01/29/14 0755  GLUCAP 167* 127* 132* 148* 122*    Recent Results (from the past 240 hour(s))  URINE CULTURE     Status: None   Collection Time    01/27/14  5:55 PM      Result Value Ref Range Status   Specimen Description URINE, CATHETERIZED   Final   Special Requests Normal   Final   Culture  Setup Time     Final   Value: 01/27/2014 22:18     Performed at [facility] Count     Final   Value: >=100,000 COLONIES/ML     Performed at [facility]   Culture     Final   Value: ESCHERICHIA COLI      Performed at [facility]   Report Status PENDING   Incomplete  CULTURE, BLOOD (ROUTINE X 2)     Status: None   Collection Time    01/27/14  6:20 PM      Result Value Ref Range Status   Specimen Description BLOOD LEFT ARM   Final   Special Requests BOTTLES DRAWN AEROBIC AND ANAEROBIC 10CC   Final   Culture  Setup Time     Final   Value: 01/27/2014 22:12     Performed at [facility]   Culture     Final   Value: STAPHYLOCOCCUS AUREUS     Note: RIFAMPIN AND GENTAMICIN SHOULD NOT BE USED AS SINGLE DRUGS FOR TREATMENT OF STAPH INFECTIONS.     Note: Gram Stain Report Called to,Read Back By and Verified With: STEPHANIE SHAW 01/28/14 0855 BY SMITHERSJ     Performed at [facility]   Report Status PENDING   Incomplete  CULTURE, BLOOD (ROUTINE X 2)     Status: None   Collection Time    01/27/14  6:30 PM      Result Value Ref Range Status   Specimen Description BLOOD LEFT HAND   Final   Special Requests BOTTLES DRAWN AEROBIC AND ANAEROBIC 10CC   Final   Culture  Setup Time     Final   Value: 01/27/2014 22:11     Performed at [facility]   Culture     Final   Value: STAPHYLOCOCCUS AUREUS     Note: Gram Stain Report Called to,Read Back By and Verified With: STEPHANIE SHAW 01/28/14 0855 BY SMITHERSJ     Performed at [facility]   Report Status PENDING   Incomplete  MRSA PCR SCREENING     Status: None   Collection Time    01/27/14 11:18 PM      Result Value Ref Range Status   MRSA by PCR NEGATIVE  NEGATIVE Final   Comment:            The GeneXpert MRSA Assay (FDA     approved for NASAL specimens     only), is one component of a     comprehensive MRSA colonization     surveillance program. It is not     intended to diagnose MRSA     infection nor to guide or     monitor treatment for     MRSA infections.     Studies:  Recent x-ray studies have been reviewed in detail by the Attending Physician  Time spent :  35 mins  Junious Silk,  ANP [facility] Office  [phone] Pager [phone]  On-Call/Text Page:  [email or web address]      password [email or web address]  If 7PM-7AM, please contact night-coverage [email or web address] Password [email or web address] 01/29/2014, 10:50 AM   LOS: 2 days  Examining patient and discussed assessment and plan with ANP Revonda Standard and agree with the above plan. Husband at bedside counseled on plan of care, answered all questions Patient with multiple complex medical issues> 40 minutes spent in direct patient care

## 2014-01-30 ENCOUNTER — Inpatient Hospital Stay (HOSPITAL_COMMUNITY): Payer: Medicaid Other

## 2014-01-30 DIAGNOSIS — A4101 Sepsis due to Methicillin susceptible Staphylococcus aureus: Principal | ICD-10-CM

## 2014-01-30 DIAGNOSIS — R338 Other retention of urine: Secondary | ICD-10-CM | POA: Diagnosis present

## 2014-01-30 DIAGNOSIS — K5909 Other constipation: Secondary | ICD-10-CM

## 2014-01-30 DIAGNOSIS — D696 Thrombocytopenia, unspecified: Secondary | ICD-10-CM | POA: Diagnosis present

## 2014-01-30 DIAGNOSIS — K5900 Constipation, unspecified: Secondary | ICD-10-CM | POA: Diagnosis present

## 2014-01-30 DIAGNOSIS — R5381 Other malaise: Secondary | ICD-10-CM

## 2014-01-30 LAB — CULTURE, BLOOD (ROUTINE X 2)

## 2014-01-30 LAB — GLUCOSE, CAPILLARY
GLUCOSE-CAPILLARY: 106 mg/dL — AB (ref 70–99)
GLUCOSE-CAPILLARY: 151 mg/dL — AB (ref 70–99)
GLUCOSE-CAPILLARY: 174 mg/dL — AB (ref 70–99)
Glucose-Capillary: 202 mg/dL — ABNORMAL HIGH (ref 70–99)

## 2014-01-30 LAB — URINE CULTURE: SPECIAL REQUESTS: NORMAL

## 2014-01-30 MED ORDER — BISACODYL 10 MG RE SUPP: 10.0000 mg | Freq: Every day | RECTAL | Status: DC | PRN

## 2014-01-30 MED ORDER — LIDOCAINE HCL (PF) 1 % IJ SOLN
INTRAMUSCULAR | Status: AC
Start: 2014-01-30 — End: 2014-01-31
  Filled 2014-01-30: qty 5

## 2014-01-30 MED ORDER — SENNA 8.6 MG PO TABS
2.0000 | ORAL_TABLET | Freq: Every day | ORAL | Status: DC
  Administered 2014-01-30 – 2014-01-31 (×2): 17.2 mg via ORAL
  Filled 2014-01-30 (×2): qty 2

## 2014-01-30 MED ORDER — FLEET ENEMA 7-19 GM/118ML RE ENEM
1.0000 | ENEMA | Freq: Every day | RECTAL | Status: DC | PRN
  Filled 2014-01-30: qty 1

## 2014-01-30 MED ORDER — POLYETHYLENE GLYCOL 3350 17 G PO PACK
17.0000 g | PACK | Freq: Two times a day (BID) | ORAL | Status: DC
  Filled 2014-01-30 (×4): qty 1

## 2014-01-30 MED ORDER — LIDOCAINE HCL (PF) 1 % IJ SOLN
INTRAMUSCULAR | Status: AC
  Filled 2014-01-30: qty 5

## 2014-01-30 MED ORDER — FLUCONAZOLE 150 MG PO TABS
150.0000 mg | ORAL_TABLET | Freq: Once | ORAL | Status: AC
  Administered 2014-01-30: 150 mg via ORAL
  Filled 2014-01-30 (×2): qty 1

## 2014-01-30 NOTE — Progress Notes (Addendum)
Chart reviewed.  Progress Note  Robin Schroeder:[id number] DOB: 25-Jun-1955 DOA: 01/27/2014 PCP: No PCP Per Patient  Brief narrative: 58 year old female who presented to the emergency department with complaints of generalized weakness. Patient has severe knee and hip pain and has experienced functional decline over the past few months with symptoms being worse in the past 4 days. She is also noticed fevers, chills, and  foul-smelling urine over this same time period.  Upon presentation to the ER she was tachycardic with a heart rate of 140, tachypneic with respiratory rates in the 30s. Her rectal temperature was 103 but she was normotensive. Chest x-ray was concerning for possible pulmonary venous congestion as well as bilateral infrahilar atelectasis but the film was also quite rotated. Patient's white count was normal at 4500 at presentation but she did have a left shift with neutrophils 86%. She appeared to be quite dehydrated with a lactic acid of 2.68 in setting of normal BUN and creatinine as well as a sodium of 136. A urinalysis was concerning for urinary tract infection. Urine culture and blood cultures were also obtained and empiric abx began by the emergency room physician.  Since admission patient reports that several days prior to admission she had made an attempt to ambulate to the bathroom but fell so basically returned to her bed and would not ambulate after that point. Based on her descriptions it seems she may have been incontinent in bed because of fear of falling. Her husband has approached the case manager and was requesting placement upon discharge pending PT and OT evaluations. Patient has been unwilling to get OOB due to pain   Assessment/Plan:  Sepsis due to Escherichia coli UTI and staph aureus bacteremia  cipro stopped by ID. Patient had urinary retention. Will discuss with ID. Blood cultures growing MSSA. Pt is PCN allergic. Defer abx to ID  Escherichia coli UTI /  acute urinary retention See above  MSSA bacteremia  Consulted [facility] for TEE. Can't do until Monday.  Repeat BC negative to date. Will need PICC, possibly placement   Diabetes mellitus, type 2 A1c in January was 7.4 and repeat this admission is 6.2 - CBGs well controlled - was not taking medications prior to admission - continue sliding scale insulin for now-likely can remain diet controlled at home  Physical deconditioning / Severe Osteoarthritis of both knees Knees are exquisitely tender R>L. No erythema. Won't work with PT due to pain. Won't bear weight. Have consulted Dr. Carola Frost. With bacteremia, need to r/o infection  Constipation: start laxatives.  Morbid Obesity (BMI 30.0-34.9)  Incidental finding of diastolic dysfunction without heart failure No CHF. Decrease IVF  Hypokalemia Corrected  Vaginal yeast infection: diflucan x1  Thrombocytopenia: poa, but now below 100k. Will d/c heparin and give SCDs. Monitor  Code Status: Full Family Communication: Husband at bedside Disposition Plan/Expected LOS:   Consultants: Dr. Staci Righter (ID) [facility] for TEE Ortho: Handy   Procedures: 10/14 2-D echocardiogram: - Left ventricle:  Mild concentric hypertrophy. -LVEF= 55% to 60%. - (grade 1 diastolic dysfunction). - Pulmonary arteries: PA peak pressure: 37 mm Hg (S) consistent with mild pulmonary hypertension  10/14 bilateral knees;Severe narrowing of all 3 compartments. Severe osteophyte formation    Antibiotics: Rocephin 10/13 >10/14 Vancomycin 10/14 > Cipro 10/15 only  HPI/Subjective: C/o bilateral shoulder pain and bilateral knee pain. Constipated for multiple days. At home, was rolling around in an office chair with wheels due to inability to walk. Denies dyspnea. rn reporting white  vaginal discharge consistent with yeast infection  Objective: Blood pressure 119/58, pulse 96, temperature 98.6 F (37 C), temperature source Oral, resp. rate 20, height 5\' 4"   (1.626 m), weight 90.719 kg (200 lb), SpO2 98.00%.  Intake/Output Summary (Last 24 hours) at 01/30/14 1032 Last data filed at 01/30/14 0700  Gross per 24 hour  Intake   3245 ml  Output    800 ml  Net   2445 ml   Exam: Gen: No acute respiratory distress - alert and conversant  Chest: Clear to auscultation bilaterally, room air Cardiac: Regular rate and rhythm, S1-S2, no rubs murmurs or gallops, no peripheral edema, no JVD-IV fluid at 125 per hour Abdomen: Soft nontender nondistended obese Extremities: no pitting edema Musculoskeletal: right greater than left knee tenderness. No erythema or warmth. Will not flex due to pain. Decreased ROM Right greater than left shoulder. Nontenter. No erythema or warmth  Scheduled Meds:  Scheduled Meds: . antiseptic oral rinse  7 mL Mouth Rinse BID  . feeding supplement (GLUCERNA SHAKE)  237 mL Oral BID BM  . heparin  5,000 Units Subcutaneous 3 times per day  . insulin aspart  0-15 Units Subcutaneous TID WC  . meloxicam  15 mg Oral Daily  . vancomycin  1,000 mg Intravenous Q12H    Data Reviewed: Basic Metabolic Panel:  Recent Labs Lab 01/27/14 1820 01/28/14 0123 01/29/14 0342  NA 136* 138 142  K 3.7 3.2* 3.9  CL 101 102 110  CO2 21 22 22   GLUCOSE 199* 187* 143*  BUN 22 32* 20  CREATININE 0.44* 0.57 0.38*  CALCIUM 8.8 8.5 8.5   Liver Function Tests:  Recent Labs Lab 01/27/14 2342 01/29/14 0342  AST 27 33  ALT 15 15  ALKPHOS 109 92  BILITOT 2.1* 1.2  PROT 6.8 6.2  ALBUMIN 1.7* 1.5*   CBC:  Recent Labs Lab 01/27/14 1820 01/28/14 0123 01/29/14 0342  WBC 4.5 5.3 2.2*  NEUTROABS 3.9  --   --   HGB 11.4* 9.9* 9.9*  HCT 34.2* 30.1* 31.4*  MCV 88.1 86.5 87.7  PLT 105* 100* 84*   CBG:  Recent Labs Lab 01/29/14 0755 01/29/14 1221 01/29/14 1746 01/29/14 2024 01/30/14 0731  GLUCAP 122* 228* 153* 147* 106*    Recent Results (from the past 240 hour(s))  URINE CULTURE     Status: None   Collection Time    01/27/14   5:55 PM      Result Value Ref Range Status   Specimen Description URINE, CATHETERIZED   Final   Special Requests Normal   Final   Culture  Setup Time     Final   Value: 01/27/2014 22:18     Performed at 01/29/14 Count     Final   Value: >=100,000 COLONIES/ML     Performed at 01/29/2014   Culture     Final   Value: ESCHERICHIA COLI     Performed at [facility]   Report Status 01/30/2014 FINAL   Final   Organism ID, Bacteria ESCHERICHIA COLI   Final  CULTURE, BLOOD (ROUTINE X 2)     Status: None   Collection Time    01/27/14  6:20 PM      Result Value Ref Range Status   Specimen Description BLOOD LEFT ARM   Final   Special Requests BOTTLES DRAWN AEROBIC AND ANAEROBIC 10CC   Final   Culture  Setup Time     Final  Value: 01/27/2014 22:12     Performed at [facility]   Culture     Final   Value: STAPHYLOCOCCUS AUREUS     Note: RIFAMPIN AND GENTAMICIN SHOULD NOT BE USED AS SINGLE DRUGS FOR TREATMENT OF STAPH INFECTIONS.     Note: Gram Stain Report Called to,Read Back By and Verified With: STEPHANIE SHAW 01/28/14 0855 BY [email or web address]     Performed at [facility]   Report Status 01/30/2014 FINAL   Final   Organism ID, Bacteria STAPHYLOCOCCUS AUREUS   Final  CULTURE, BLOOD (ROUTINE X 2)     Status: None   Collection Time    01/27/14  6:30 PM      Result Value Ref Range Status   Specimen Description BLOOD LEFT HAND   Final   Special Requests BOTTLES DRAWN AEROBIC AND ANAEROBIC 10CC   Final   Culture  Setup Time     Final   Value: 01/27/2014 22:11     Performed at [facility]   Culture     Final   Value: STAPHYLOCOCCUS AUREUS     Note: SUSCEPTIBILITIES PERFORMED ON PREVIOUS CULTURE WITHIN THE LAST 5 DAYS.     Note: Gram Stain Report Called to,Read Back By and Verified With: STEPHANIE SHAW 01/28/14 0855 BY [email or web address]     Performed at [facility]   Report Status 01/30/2014 FINAL   Final  MRSA PCR SCREENING      Status: None   Collection Time    01/27/14 11:18 PM      Result Value Ref Range Status   MRSA by PCR NEGATIVE  NEGATIVE Final   Comment:            The GeneXpert MRSA Assay (FDA     approved for NASAL specimens     only), is one component of a     comprehensive MRSA colonization     surveillance program. It is not     intended to diagnose MRSA     infection nor to guide or     monitor treatment for     MRSA infections.     Studies:   Time spent :  37 mins  Crista Curb, MD [facility] [phone]  If 7PM-7AM, please contact night-coverage [email or web address] Password [email or web address] 01/30/2014, 10:32 AM   LOS: 3 days

## 2014-01-30 NOTE — Evaluation (Signed)
Occupational Therapy Evaluation Patient Details Name: Robin Schroeder MRN: [id number] DOB: 03/24/1956 Today's Date: 01/30/2014    History of Present Illness Pt. was admitted 01/27/14 with fever, chills, weakness and significant functional decline over the last several month and thought related to knee and hip pain.  Pt. with PMH of DM. obesity, severe OA of both knees.   Clinical Impression   Pt reports being able to ambulate short distances using a desk chair for balance.  She could sponge, dress and toilet until 1 week ago.  Pt seen after lifted to chair. Unable to perform bed mobility or stand due to pain per report.  She was able to self feed with set up and groom seated with min assist.  Pt has longstanding shoulder ROM limitations. Pt stated she did not come to the hospital sooner because of lack of insurance.  Will follow acutely.  Pt will need 24 hour care and post acute rehab upon d/c.    Follow Up Recommendations  SNF;Supervision/Assistance - 24 hour    Equipment Recommendations       Recommendations for Other Services       Precautions / Restrictions Precautions Precautions: Fall;Other (comment) Precaution Comments: Pt. cries out with touch of either LE Restrictions Weight Bearing Restrictions: No      Mobility Bed Mobility Overal bed mobility:  (unable to assess due to patient refusal/pain)                Transfers Overall transfer level:  (pt lifted to chair by nursing)                    Balance                                            ADL Overall ADL's : Needs assistance/impaired Eating/Feeding: Set up;Sitting   Grooming: Wash/dry hands;Wash/dry face;Oral care;Sitting;Minimal assistance   Upper Body Bathing: Maximal assistance;Sitting   Lower Body Bathing: Total assistance;Bed level;+2 for physical assistance   Upper Body Dressing : Moderate assistance;Sitting   Lower Body Dressing: Total assistance;+2 for  physical assistance;Bed level                       Vision                     Perception     Praxis      Pertinent Vitals/Pain Pain Assessment: Faces Faces Pain Scale: Hurts whole lot Pain Location: B LEs Pain Descriptors / Indicators: Aching Pain Intervention(s): Repositioned (had just received pain meds by injection)     Hand Dominance Right   Extremity/Trunk Assessment Upper Extremity Assessment Upper Extremity Assessment: RUE deficits/detail;LUE deficits/detail RUE Deficits / Details: full AROM elbow to hand, 30 degrees FF, tremor RUE Coordination: decreased gross motor;decreased fine motor LUE Deficits / Details: full AROM elbow to hand, 30 degrees FF, tremor LUE Coordination: decreased fine motor;decreased gross motor   Lower Extremity Assessment Lower Extremity Assessment: Defer to PT evaluation RLE Deficits / Details: pt. in bed with R LE externally rotaed and knee flexed.  DF actively appears near neutral, otherwise, pt. would not attempt to further extend knee or hip due to pain RLE: Unable to fully assess due to pain RLE Sensation:  (seems hypersensitive to light touch) LLE Deficits / Details: L LE hip external rotation with knee  flexion as a position for comfort.  Pt. unable to tolerate attempt to fully extend knee or hip in supine due to pain.  active DF near neutral but produces tears when she attempts LLE: Unable to fully assess due to pain LLE Sensation:  (seems hypersensitive to light touch)       Communication Communication Communication: No difficulties   Cognition Arousal/Alertness: Awake/alert Behavior During Therapy: Anxious Overall Cognitive Status: No family/caregiver present to determine baseline cognitive functioning                     General Comments       Exercises       Shoulder Instructions      Home Living Family/patient expects to be discharged to:: Skilled nursing facility Living Arrangements:  Spouse/significant other                                      Prior Functioning/Environment Level of Independence: Needs assistance  Gait / Transfers Assistance Needed: history of falls, reports using a rolling desk chair as her "walking assistive device".  Reports that recently she has been in the bed much of the itime ADL's / Homemaking Assistance Needed: Pt reports she could sponge bathe, dress and take herself to the bathrom unitl 1 week prior to admission.  She could use a desk chair to walk to the kitchen to retrieve meals prepared by her husband.  Husband did all housekeeping.        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) OT Goal Formulation: With patient Time For Goal Achievement: 02/13/14 Potential to Achieve Goals: Good ADL Goals Pt Will Perform Grooming: with set-up;sitting Pt Will Perform Upper Body Bathing: with min assist;sitting Pt Will Perform Upper Body Dressing: sitting;with supervision Pt Will Transfer to Toilet: with +2 assist;with mod assist;squat pivot transfer;bedside commode Pt/caregiver will Perform Home Exercise Program: Increased ROM;Increased strength;Both right and left upper extremity;With minimal assist  OT Frequency: Min 2X/week   Barriers to D/C: Decreased caregiver support          Co-evaluation              End of Session    Activity  Tolerance: Patient tolerated treatment well Patient left: in chair;with call bell/phone within reach   Time: 5993-5701 OT Time Calculation (min): 41 min Charges:  OT General Charges $OT Visit: 1 Procedure OT Evaluation $Initial OT Evaluation Tier I: 1 Procedure OT Treatments $Self Care/Home Management : 23-37 mins G-Codes:    Evern Bio 01/30/2014, 1:49 PM [phone]

## 2014-01-30 NOTE — Consult Note (Signed)
[facility]   Pt seen and evaluated Consult dictated Dictation: [id number]  Mearl Latin, PA-C [facility] [phone] (P) 01/30/2014 12:19 PM

## 2014-01-30 NOTE — Procedures (Signed)
Clinician: Mearl Latin, PA-C  Specimen: Left knee: no fluid         Right knee: no fluid   Complications: none  Intra-articular medications: 5 cc 1% lidocaine in Right knee and 5 cc 1% lidocaine Left knee, infiltrated skin with 2.5cc 1% lidocaine  Description  Utilizing sterile technique the medial joint line of the Right knee was prepped with alcohol swab, followed by betadine swabs x 4.  Knee was partially flexed to pts comfort. The SQ tissue was then infiltrated with 2.5 cc of 1% lidocaine (plain).  After adequate anesthesia of the skin was achieved, an 18 G needle on a 20 cc syringe was then advanced into the knee joint. A palpable pop was appreciated upon entrance to the knee. No fluid was obtained, needle was maneuvered into different positions as well without any fluid return  Then, again, utilizing sterile technique the lateral joint line of the left knee was prepped with alcohol swab, followed by betadine swabs x 4.  Knee was partially flexed to pts comfort. The SQ tissue was then infiltrated with 2.5 cc of 1% lidocaine (plain).  After adequate anesthesia of the skin was achieved, an 18 G needle on a 20 cc syringe was then advanced into the knee joint. A palpable pop was appreciated upon entrance to the knee. No fluid was obtained, needle was maneuvered into different positions as well without any fluid return.     Based on the clinical appearance of the knee and lack of fluid, a septic joint is a very low probability. I did  Inject 5 cc of 1 % lidocain (plain) into each knee prior to needle removal.  Needle removed from each joint without difficulty, dressing applied.  Pt tolerated procedure well.     No specimens obtained.  Pt can participate with therapies as she can tolerate. WBAT, no ROM restrictions.  Again suspect her knee pain is related to endstange DJD, possible joint ankylosis and probable synovitis. Pt will need outpt ortho follow up   Mearl Latin, PA-C [facility] [phone] (P) 01/30/2014 1:30 PM

## 2014-01-30 NOTE — Evaluation (Signed)
Physical Therapy Evaluation Patient Details Name: Robin Schroeder MRN: [id number] DOB: 07/23/1955 Today's Date: 01/30/2014   History of Present Illness  Pt. was admitted 01/27/14 with fever, chills, weakness and significant functional decline over the last several month and thought related to knee and hip pain.  Pt. with PMH of DM. obesity, severe OA of both knees.  Clinical Impression  Pt. Presents to PT with a recent history of decline in functional mobility and gait and will benefit from acute PT to begin to address these and below problem areas.  Pt. Very intolerant to any attempts at mobility today.  She easily became tearful during session and could not even tolerate me moving her legs toward EOB.  Pt. Seems to have some LE hypersensitivity to touch, unclear if this is due to her anticipation of pain.  Husband arrived during session and says pt. Needs lots of encouragement.  He confirms a pattern of increasing dependency in funcitonal mobility and gait.  Pt. Was not clear as to when she last was able to walk or transfer.  Believe she will need a slow courss of therapies and 24 hour assist.  SNF likely best option for her at time of DC.      Follow Up Recommendations SNF;Supervision/Assistance - 24 hour    Equipment Recommendations  Other (comment) (TBD once she tolerates mobility better)    Recommendations for Other Services       Precautions / Restrictions Precautions Precautions: Fall;Other (comment) (pt. hypersensitive to touch in (B) LEs) Precaution Comments: Pt. cries out with touch of either LE Restrictions Weight Bearing Restrictions: No      Mobility  Bed Mobility Overal bed mobility:  (unable to assess due to patient refusal/pain)                Transfers Overall transfer level:  (pt.  refused to attempt)                  Ambulation/Gait                Stairs            Wheelchair Mobility    Modified Rankin (Stroke Patients Only)       Balance                                             Pertinent Vitals/Pain Pain Assessment: Faces Faces Pain Scale: Hurts worst Pain Location: both lower extremities appear hypersensitive to touch, especially knees and feet Pain Descriptors / Indicators: Aching;Sore Pain Intervention(s): Limited activity within patient's tolerance;Patient requesting pain meds-RN notified    Home Living Family/patient expects to be discharged to:: Skilled nursing facility Living Arrangements: Spouse/significant other                    Prior Function Level of Independence: Needs assistance   Gait / Transfers Assistance Needed: history of falls, reports using a rolling desk chair as her "walking assistive device".  Reports that recently she has been in the bed much of the itime           Hand Dominance        Extremity/Trunk Assessment   Upper Extremity Assessment: Defer to OT evaluation           Lower Extremity Assessment: RLE deficits/detail;LLE deficits/detail RLE Deficits / Details: pt. in bed with R  LE externally rotaed and knee flexed.  DF actively appears near neutral, otherwise, pt. would not attempt to further extend knee or hip due to pain LLE Deficits / Details: L LE hip external rotation with knee flexion as a position for comfort.  Pt. unable to tolerate attempt to fully extend knee or hip in supine due to pain.  active DF near neutral but produces tears when she attempts     Communication   Communication: No difficulties  Cognition Arousal/Alertness: Awake/alert Behavior During Therapy: Anxious Overall Cognitive Status: No family/caregiver present to determine baseline cognitive functioning                      General Comments      Exercises        Assessment/Plan    PT Assessment Patient needs continued PT services  PT Diagnosis Difficulty walking;Generalized weakness;Acute pain   PT Problem List Decreased  strength;Decreased range of motion;Decreased activity tolerance;Decreased mobility;Decreased knowledge of use of DME;Decreased knowledge of precautions;Pain  PT Treatment Interventions DME instruction;Gait training;Functional mobility training;Therapeutic activities;Therapeutic exercise;Patient/family education   PT Goals (Current goals can be found in the Care Plan section) Acute Rehab PT Goals 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: 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) PT Goal Formulation: With patient/family Time For Goal Achievement: 02/13/14 Potential to Achieve Goals: Fair    Frequency Min 3X/week   Barriers to discharge Decreased caregiver support      Co-evaluation               End of Session   Activity Tolerance: Patient limited by pain Patient left: in bed;with call bell/phone within reach Nurse Communication: Mobility status;Other (comment) (need for lift equipment for OOB)         Time: 5329-9242 PT Time Calculation (min): 23 min   Charges:   PT Evaluation $Initial PT Evaluation Tier I: 1 Procedure PT Treatments $Therapeutic Exercise: 8-22 mins   PT G Codes:          Ferman Hamming 01/30/2014, 1:14 PM Weldon Picking PT Acute Rehab Services [phone] [facility] [phone]

## 2014-01-30 NOTE — Consult Note (Signed)
NAMERAVON, Robin Schroeder NO.:  Schroeder  MEDICAL RECORD NO.:  [id number]  LOCATION:  6E06C                        FACILITY:  [facility]  PHYSICIAN:  Doralee Albino. Carola Frost, M.D. DATE OF BIRTH:  Nov 23, 1955  DATE OF CONSULTATION:  01/30/2014 DATE OF DISCHARGE:                                CONSULTATION   REASON FOR CONSULTATION:  Bilateral knee pain in the setting of sepsis due to E. coli UTI and Staph aureus bacteremia.  HISTORY OF PRESENT ILLNESS:  Robin Schroeder Schroeder is a 59 year old white female, who presented to the hospital emergency department here at [facility] on January 27, 2014, due to functional decline over the past few months. She has reported pretty severe knee and hip pain as well as shoulder and neck pain for the last several months as well and had a fall approximately 2 weeks ago where she landed on her back and since then has had tremendous difficulty moving around.  After extensive discussions with the husband and the patient, it sounds as if the patient has not been ambulatory in about 2-3 weeks nor has she been independent ambulator in over a year.  She oftentimes uses a rolling chair to lean on, to ambulate.  She has not left the house in quite some time as well and really does not go out into the community at all.  She was admitted on January 27, 2014, and was found to have E. coli UTI and subsequent labs demonstrated Staph aureus bacteremia.  The patient was seen and evaluated by Infectious Disease today by Dr. Luciana Axe who consulted Orthopedics given her bilateral knee pain.  The patient seen and evaluated today on [address] Room 6.  Husband is in the room with the patient.  Again the patient and her husband relate pertinent musculoskeletal information as noted above.  She did see Dr. Merla Riches to establish primary care followup in January who subsequently referred the patient to [facility], however, the patient never made a followup appointment as she heard  from other people that it was very difficult to get in for an appointment with them.  Husband also states that he called on numerous occasions without any return phone calls.  In addition, there are also financial difficulties that precluded them from being seen.  The patient has been unemployed since the beginning of 2015, and the patient's husband only makes a fairly meager wage himself. It sounds as if the patient's overall deconditioning stems back to December 2014.  Husband recalled that the patient got a significant viral infection and was given numerous medications and since this point in time, she was hit pretty hard physically by this and as such had diminished activity and it has led to subsequent deconditioning over the last several months.  The patient was previously working at [employer] in the [employer], but again at the current time does not really do any activities and is essentially homebound.  MEDICAL HISTORY:  Notable for hypertension, diabetes, and arthritis.  SURGICAL HISTORY:  Denies.  FAMILY HISTORY:  Hypertension.  SOCIAL HISTORY:  The patient lives with her husband.  Does not smoke. Does not drink.  Previously was working at [employer], but  has been unemployed for approximately 1 year.  REVIEW OF SYSTEMS:  As noted above in the HPI, but the patient denies any current chest pain or shortness of breath.  No current fever, no chills.  She does note bilateral knee pain, bilateral shoulder pain, and neck pain.  Knee pain is pretty severe.  The patient denies any nausea or vomiting.  No abdominal pain.  No numbness or tingling in her lower extremities.  CURRENT LABORATORY DATA:  From yesterday, January 29, 2014, sodium 145, potassium 3.9, chloride 110, bicarb 22, BUN 20, creatinine 0.38, glucose 143, albumin is 1.5, AST is 33, ALT is 15, total protein 6.2.  Anemia panel seems to be consistent with that of anemia of chronic disease or chronic  inflammation as her iron is 11, TIBC is low at 117, and her ferritin is elevated at 1524.  CBC; white blood cells 2.2, hemoglobin 9.9, hematocrit 31.4, platelets are low at 84.  Hemoglobin A1c is 6.2, TSH is 1.9.  IMAGING:  Left and right knee films were obtained on January 28, 2014, and shows very severe tricompartmental arthritis with extensive osteophyte formation on the distal femur, proximal tibia, as well as the patella.  No effusion noted on films as well on the left.  Right knee is also notable for severe tricompartmental osteoarthritis with patellofemoral osteophyte formation.  No effusion again noted.  Again, left knee is worse than the right knee.  PHYSICAL EXAMINATION:  VITAL SIGNS:  Temperature 98.6, heart rate 96, respirations 20, 98% on room air, BP is 119/58, the patient is 200 pounds, 5 feet and 4 inches with a BMI of 34.3. GENERAL:  The patient is resting in bed.  She is very pleasant, but does appear to be somewhat uncomfortable at times.  She does not appear to be stable at this time.  She does appear to be somewhat comfortable when resting still.  She does appear to be older than stated age. EXTREMITIES:  Focused examination of her bilateral lower extremities, her lower extremities are disproportionate in terms of their growth. She appears to be a pear-shaped individual.  Her legs are resting with knees in flexion.  There is no significant erythema of her knees bilaterally.  Temperature is symmetric to her knees bilaterally as well. No pain with evaluation of her hips bilaterally with logrolling or axial loading.  Ankles and lower legs are unremarkable with evaluation as well.  No open wounds or lesions are noted.  The patient does have tenderness with palpation of her knees bilaterally primarily over her patella.  She does have tenderness at her joint lines as well, as well as some discomfort with palpation of the proximal tibia bilaterally as well.  Unable to  really evaluate her ligamentous integrity.  It is very difficult to passively and actively range her knees due to severe pain. No knee effusions are appreciated bilaterally.  Again, no erythema, no soft tissue desquamation is noted.  No open wounds or lesions are noted to her knees bilaterally as well.  Again range of motion is limited with both active and passive ranging, however, with passive ranging it does feel as if there is a mechanical block to her knees.  Unable to fully extend or flex really in current position at this time.  Ankle range of motion is full and symmetric bilaterally as is her toe motion.  DPN, SPN, and TN sensory function intact bilaterally.  EHL, FHL, anterior tibialis, posterior tibialis, peroneals, gastrocsoleus complex, motor functions intact bilaterally  as well.  The patient really unable to perform quad set or hamstring contraction due to severe knee pain. Palpable dorsalis pedis pulses are noted bilaterally.  Extremities are warm bilaterally as well.  ASSESSMENT/PLAN:  A 58 year old, white female with bacteremia and urinary tract infection with severe deconditioning for nearly a year with bilateral knee pain with severe arthritis on x-rays. 1. Bilateral knee pain, end-stage degenerative joint disease     bilaterally, concern for septic arthritis.            Based on my clinical exam, I do not think that there is septic     arthritis present, however, the fact that she had severe pain with     passive and active range of motion does cause some concern for     this, however, there is no erythema or knee effusion to suggest     active infection.  What I do think may be the etiology of her     bilateral knee pain is multifactorial including her deconditioning,     her severe end-stage degenerative joint disease, her lack of     ambulation in almost 2 weeks as well as primarily I do believe that     her joints are becoming ankylosed, which is the real reason for  her     knee pain.  However, we will obtain a new set of x-rays of her     bilateral knees as well as her bilateral lower legs to evaluate for     this.            We will not perform arthrocentesis at this moment, however, we may     proceed with injection with local anesthetic in the knee joint to     see if this relieves her pain and provide some ability to begin     range of motion.  We will also not inject her knees with any     steroids given her acute bacteremia and UTI.  At the time of the     injection of the local, we will also be able to aspirate any fluid,     which I suspect is negligible given the amount of arthritis she has     at present.  I also do not appreciate any effusion on her plain     films and again suspect that septic arthritis is not the cause of     her knee pain. 2. Sepsis due to Escherichia coli urinary tract infection and Staph     aureus bacteremia.  Continue per ID.  It sounds as if the patient     will get a PICC. 3. Type 2 diabetes per Medicine. 4. Severe physical deconditioning, sounded that this has been going on     for quite some time.  The patient is definitely weak and will     likely need skilled nursing at the time of discharge to be able to     provide full care for the patient especially if she has a PICC and     will need assistance mobilizing.  Ultimately, the patient will need     additional orthopedic followup and sounds as if she is headed     towards bilateral total knee replacements. 5. Morbid obesity. 6. Metabolic bone disease.  The patient does have several labs     reflective of poor nutritional intake including her albumin in her     anemia panel.  We will  check a vitamin D panel as low vitamin D     also is a cause of musculoskeletal pain.  We will replete as     needed.  The patient will need a DEXA scan as an outpatient as     well. 7. Miscellaneous going forward obviously, the patient definitely will     need financial  assistance to proceed with future intervention     including any surgery if she so desires.  This sounds as if that     financial issues were to turn from being followed up sooner with     respect to orthopedic issues. 8.DVT and PE prophylaxis.  Continue per Medicine Service. 9.Disposition.  We will follow up on plain films and then we will     likely aspirate and inject her knees later on today.  Again due to     her current position where her legs are in, we will not likely be     able to utilize typical suprapatellar approach for her injection     and we will likely do it anterior medial or lateral.  Discussed findings with the patient and her husband and plans for moving forward.  They were     both in agreement and very appreciative.     Mearl Latin, PA-C   ______________________________ Doralee Albino. Carola Frost, M.D.    KWP/MEDQ  D:  01/30/2014  T:  01/30/2014  Job:  [id number]

## 2014-01-31 LAB — CBC
HEMATOCRIT: 28.6 % — AB (ref 36.0–46.0)
HEMOGLOBIN: 9.2 g/dL — AB (ref 12.0–15.0)
MCH: 27.6 pg (ref 26.0–34.0)
MCHC: 32.2 g/dL (ref 30.0–36.0)
MCV: 85.9 fL (ref 78.0–100.0)
Platelets: 103 10*3/uL — ABNORMAL LOW (ref 150–400)
RBC: 3.33 MIL/uL — ABNORMAL LOW (ref 3.87–5.11)
RDW: 15.8 % — ABNORMAL HIGH (ref 11.5–15.5)
WBC: 2.5 10*3/uL — ABNORMAL LOW (ref 4.0–10.5)

## 2014-01-31 LAB — GLUCOSE, CAPILLARY
GLUCOSE-CAPILLARY: 183 mg/dL — AB (ref 70–99)
GLUCOSE-CAPILLARY: 241 mg/dL — AB (ref 70–99)
Glucose-Capillary: 108 mg/dL — ABNORMAL HIGH (ref 70–99)

## 2014-01-31 LAB — VANCOMYCIN, TROUGH: Vancomycin Tr: 9.9 ug/mL — ABNORMAL LOW (ref 10.0–20.0)

## 2014-01-31 LAB — PREALBUMIN: Prealbumin: <3 mg/dL — ABNORMAL LOW (ref 17.0–34.0)

## 2014-01-31 LAB — VITAMIN D 25 HYDROXY (VIT D DEFICIENCY, FRACTURES): Vit D, 25-Hydroxy: 30 ng/mL (ref 30–89)

## 2014-01-31 LAB — TRANSFERRIN: Transferrin: <100 mg/dL — ABNORMAL LOW (ref 200–360)

## 2014-01-31 MED ORDER — VANCOMYCIN HCL IN DEXTROSE 1-5 GM/200ML-% IV SOLN
1000.0000 mg | Freq: Three times a day (TID) | INTRAVENOUS | Status: DC
  Administered 2014-02-01 – 2014-02-02 (×4): 1000 mg via INTRAVENOUS
  Filled 2014-01-31 (×6): qty 200

## 2014-01-31 MED ORDER — POLYETHYLENE GLYCOL 3350 17 G PO PACK
17.0000 g | PACK | Freq: Every day | ORAL | Status: DC
  Filled 2014-01-31 (×4): qty 1

## 2014-01-31 NOTE — Progress Notes (Signed)
ANTIBIOTIC CONSULT NOTE - Follow Up  Pharmacy Consult for vancomycin Indication: MSSA bacteremia   Allergies  Allergen Reactions  . Penicillins Rash    Patient Measurements: Height: 5\' 4"  (162.6 cm) Weight: 202 lb (91.627 kg) IBW/kg (Calculated) : 54.7  Vital Signs: Temp: 97.8 F (36.6 C) (10/17 0543) Temp Source: Oral (10/17 0543) BP: 122/56 mmHg (10/17 0543) Pulse Rate: 85 (10/17 0543) Intake/Output from previous day: 10/16 0701 - 10/17 0700 In: -  Out: 800 [Urine:800] Intake/Output from this shift: Total I/O In: 200 [IV Piggyback:200] Out: -   Labs:  Recent Labs  01/29/14 0342 01/31/14 0538  WBC 2.2* 2.5*  HGB 9.9* 9.2*  PLT 84* 103*  CREATININE 0.38*  --    Estimated Creatinine Clearance: 83.1 ml/min (by C-G formula based on Cr of 0.38). No results found for this basename: VANCOTROUGH, 02/02/14, VANCORANDOM, GENTTROUGH, GENTPEAK, GENTRANDOM, TOBRATROUGH, TOBRAPEAK, TOBRARND, AMIKACINPEAK, AMIKACINTROU, AMIKACIN,  in the last 72 hours   Microbiology: Recent Results (from the past 720 hour(s))  URINE CULTURE     Status: None   Collection Time    01/27/14  5:55 PM      Result Value Ref Range Status   Specimen Description URINE, CATHETERIZED   Final   Special Requests Normal   Final   Culture  Setup Time     Final   Value: 01/27/2014 22:18     Performed at 01/29/2014 Count     Final   Value: >=100,000 COLONIES/ML     Performed at [facility]   Culture     Final   Value: ESCHERICHIA COLI     Performed at [facility]   Report Status 01/30/2014 FINAL   Final   Organism ID, Bacteria ESCHERICHIA COLI   Final  CULTURE, BLOOD (ROUTINE X 2)     Status: None   Collection Time    01/27/14  6:20 PM      Result Value Ref Range Status   Specimen Description BLOOD LEFT ARM   Final   Special Requests BOTTLES DRAWN AEROBIC AND ANAEROBIC 10CC   Final   Culture  Setup Time     Final   Value: 01/27/2014 22:12     Performed at  01/29/2014   Culture     Final   Value: STAPHYLOCOCCUS AUREUS     Note: RIFAMPIN AND GENTAMICIN SHOULD NOT BE USED AS SINGLE DRUGS FOR TREATMENT OF STAPH INFECTIONS.     Note: Gram Stain Report Called to,Read Back By and Verified With: STEPHANIE SHAW 01/28/14 0855 BY [email or web address]     Performed at 01/30/14   Report Status 01/30/2014 FINAL   Final   Organism ID, Bacteria STAPHYLOCOCCUS AUREUS   Final  CULTURE, BLOOD (ROUTINE X 2)     Status: None   Collection Time    01/27/14  6:30 PM      Result Value Ref Range Status   Specimen Description BLOOD LEFT HAND   Final   Special Requests BOTTLES DRAWN AEROBIC AND ANAEROBIC 10CC   Final   Culture  Setup Time     Final   Value: 01/27/2014 22:11     Performed at 01/29/2014   Culture     Final   Value: STAPHYLOCOCCUS AUREUS     Note: SUSCEPTIBILITIES PERFORMED ON PREVIOUS CULTURE WITHIN THE LAST 5 DAYS.     Note: Gram Stain Report Called to,Read Back By and Verified With: STEPHANIE SHAW 01/28/14  0300 BY [email or web address]     Performed at [facility]   Report Status 01/30/2014 FINAL   Final  MRSA PCR SCREENING     Status: None   Collection Time    01/27/14 11:18 PM      Result Value Ref Range Status   MRSA by PCR NEGATIVE  NEGATIVE Final   Comment:            The GeneXpert MRSA Assay (FDA     approved for NASAL specimens     only), is one component of a     comprehensive MRSA colonization     surveillance program. It is not     intended to diagnose MRSA     infection nor to guide or     monitor treatment for     MRSA infections.    Medical History: Past Medical History  Diagnosis Date  . Hypertension   . Diabetes mellitus without complication   . Arthritis     Medications:  Prescriptions prior to admission  Medication Sig Dispense Refill  . naproxen sodium (ANAPROX) 220 MG tablet Take 440 mg by mouth 2 (two) times daily as needed (for pain).      . Throat Lozenges (COUGH DROPS MT) Use as directed  1 lozenge in the mouth or throat daily as needed (for cough).       Assessment: 58 year old woman who presented to [facility] yesterday with weakness, fever and severe hip and knee pain. Pharmacy currently dosing Vancomycin for MSSA bacteremia. WBC 2.5. Pt is afebrile. Renal fx has remained stable.   Ceftriaxone 10/13>>10/14 Vancomycin 10/14>> Cipro 10/14 >>10/15 (d/c'd by ID) fluc 10/16 x1  10/13 Blood x 2: MSSA 10/13 Urine: (+)EColi, pan sens 10/16: BCx x2>>   Goal of Therapy:  Vancomycin trough level 15-20 mcg/ml  Plan:  - Continue vancomycin 1000mg  IV q12h given PCN allergy - f/u renal function - f/u TEE and need to place PICC. Plan is to insert PICC after blood cultures negative at least 48 hours  - 2130 VT today    2131, PharmD.  Clinical Pharmacist Pager [phone]

## 2014-01-31 NOTE — Progress Notes (Signed)
ANTIBIOTIC CONSULT NOTE - FOLLOW UP  Pharmacy Consult for Vancomycin Indication: MSSA Bacteremia  Allergies  Allergen Reactions  . Penicillins Rash    Patient Measurements: Height: 5\' 4"  (162.6 cm) Weight: 204 lb 1.3 oz (92.57 kg) IBW/kg (Calculated) : 54.7  Vital Signs: Temp: 99.2 F (37.3 C) (10/17 2154) Temp Source: Oral (10/17 2154) BP: 108/65 mmHg (10/17 2154) Pulse Rate: 96 (10/17 2154) Intake/Output from previous day: 10/16 0701 - 10/17 0700 In: -  Out: 800 [Urine:800] Intake/Output from this shift:    Labs:  Recent Labs  01/29/14 0342 01/31/14 0538  WBC 2.2* 2.5*  HGB 9.9* 9.2*  PLT 84* 103*  CREATININE 0.38*  --    Estimated Creatinine Clearance: 83.6 ml/min (by C-G formula based on Cr of 0.38).  Recent Labs  01/31/14 2123  VANCOTROUGH 9.9*     Microbiology: Recent Results (from the past 720 hour(s))  URINE CULTURE     Status: None   Collection Time    01/27/14  5:55 PM      Result Value Ref Range Status   Specimen Description URINE, CATHETERIZED   Final   Special Requests Normal   Final   Culture  Setup Time     Final   Value: 01/27/2014 22:18     Performed at [facility] Count     Final   Value: >=100,000 COLONIES/ML     Performed at [facility]   Culture     Final   Value: ESCHERICHIA COLI     Performed at [facility]   Report Status 01/30/2014 FINAL   Final   Organism ID, Bacteria ESCHERICHIA COLI   Final  CULTURE, BLOOD (ROUTINE X 2)     Status: None   Collection Time    01/27/14  6:20 PM      Result Value Ref Range Status   Specimen Description BLOOD LEFT ARM   Final   Special Requests BOTTLES DRAWN AEROBIC AND ANAEROBIC 10CC   Final   Culture  Setup Time     Final   Value: 01/27/2014 22:12     Performed at [facility]   Culture     Final   Value: STAPHYLOCOCCUS AUREUS     Note: RIFAMPIN AND GENTAMICIN SHOULD NOT BE USED AS SINGLE DRUGS FOR TREATMENT OF STAPH INFECTIONS.     Note:  Gram Stain Report Called to,Read Back By and Verified With: STEPHANIE SHAW 01/28/14 0855 BY SMITHERSJ     Performed at [facility]   Report Status 01/30/2014 FINAL   Final   Organism ID, Bacteria STAPHYLOCOCCUS AUREUS   Final  CULTURE, BLOOD (ROUTINE X 2)     Status: None   Collection Time    01/27/14  6:30 PM      Result Value Ref Range Status   Specimen Description BLOOD LEFT HAND   Final   Special Requests BOTTLES DRAWN AEROBIC AND ANAEROBIC 10CC   Final   Culture  Setup Time     Final   Value: 01/27/2014 22:11     Performed at [facility]   Culture     Final   Value: STAPHYLOCOCCUS AUREUS     Note: SUSCEPTIBILITIES PERFORMED ON PREVIOUS CULTURE WITHIN THE LAST 5 DAYS.     Note: Gram Stain Report Called to,Read Back By and Verified With: STEPHANIE SHAW 01/28/14 0855 BY SMITHERSJ     Performed at [facility]   Report Status 01/30/2014 FINAL  Final  MRSA PCR SCREENING     Status: None   Collection Time    01/27/14 11:18 PM      Result Value Ref Range Status   MRSA by PCR NEGATIVE  NEGATIVE Final   Comment:            The GeneXpert MRSA Assay (FDA     approved for NASAL specimens     only), is one component of a     comprehensive MRSA colonization     surveillance program. It is not     intended to diagnose MRSA     infection nor to guide or     monitor treatment for     MRSA infections.  CULTURE, BLOOD (ROUTINE X 2)     Status: None   Collection Time    01/30/14  5:00 AM      Result Value Ref Range Status   Specimen Description BLOOD LEFT ARM   Final   Special Requests BOTTLES DRAWN AEROBIC AND ANAEROBIC 5CC   Final   Culture  Setup Time     Final   Value: 01/30/2014 08:46     Performed at [facility]   Culture     Final   Value:        BLOOD CULTURE RECEIVED NO GROWTH TO DATE CULTURE WILL BE HELD FOR 5 DAYS BEFORE ISSUING A FINAL NEGATIVE REPORT     Performed at [facility]   Report Status PENDING   Incomplete  CULTURE,  BLOOD (ROUTINE X 2)     Status: None   Collection Time    01/30/14  5:10 AM      Result Value Ref Range Status   Specimen Description BLOOD LEFT HAND   Final   Special Requests BOTTLES DRAWN AEROBIC AND ANAEROBIC 5CC   Final   Culture  Setup Time     Final   Value: 01/30/2014 08:47     Performed at [facility]   Culture     Final   Value:        BLOOD CULTURE RECEIVED NO GROWTH TO DATE CULTURE WILL BE HELD FOR 5 DAYS BEFORE ISSUING A FINAL NEGATIVE REPORT     Performed at [facility]   Report Status PENDING   Incomplete    Anti-infectives   Start     Dose/Rate Route Frequency Ordered Stop   01/30/14 1300  fluconazole (DIFLUCAN) tablet 150 mg     150 mg Oral  Once 01/30/14 1122 01/30/14 2348   01/29/14 0800  ciprofloxacin (CIPRO) IVPB 400 mg  Status:  Discontinued     400 mg 200 mL/hr over 60 Minutes Intravenous Every 12 hours 01/29/14 0752 01/29/14 1603   01/28/14 1845  cefTRIAXone (ROCEPHIN) 1 g in dextrose 5 % 50 mL IVPB  Status:  Discontinued     1 g 100 mL/hr over 30 Minutes Intravenous Every 24 hours 01/27/14 2026 01/28/14 1352   01/28/14 1430  vancomycin (VANCOCIN) IVPB 1000 mg/200 mL premix     1,000 mg 200 mL/hr over 60 Minutes Intravenous Every 12 hours 01/28/14 1352     01/27/14 1845  cefTRIAXone (ROCEPHIN) 1 g in dextrose 5 % 50 mL IVPB     1 g 100 mL/hr over 30 Minutes Intravenous  Once 01/27/14 1834 01/27/14 1910      Assessment: 57 YOF who continues on Vancomycin for MSSA bacteremia - awaiting further work-up by ID. Still evaluating for source, planning TEE on  10/19. A Vancomycin trough this evening as SUBtherapeutic (VT 9.9 mcg/ml, goal of 15-20 mcg/ml). Renal function stable.  Goal of Therapy:  Vancomycin trough level 15-20 mcg/ml  Plan:  1. Adjust Vancomycin to 1g IV every 8 hours 2. Will continue to follow renal function, culture results, LOT, and antibiotic de-escalation plans   Georgina Pillion, PharmD, BCPS Clinical  Pharmacist Pager: [phone] 01/31/2014 10:22 PM

## 2014-01-31 NOTE — Progress Notes (Addendum)
Progress Note  Robin Schroeder MEQ:[id number] DOB: 09/09/1955 DOA: 01/27/2014 PCP: No PCP Per Patient  Brief narrative: 58 year old female who presented to the emergency department with complaints of generalized weakness. Patient has severe knee and hip pain and has experienced functional decline over the past few months with symptoms being worse in the past 4 days. She is also noticed fevers, chills, and  foul-smelling urine over this same time period.  Upon presentation to the ER she was tachycardic with a heart rate of 140, tachypneic with respiratory rates in the 30s. Her rectal temperature was 103 but she was normotensive.   Assessment/Plan:  Sepsis due to MSSA bacteremia. PCN allergic. Continue vancomycin.  PICC Monday if cultures remain negative per ID. Await TEE. SNF would be optimal, but problematic as no insurance  Escherichia coli in urine/acute urinary retention ID stopped cipro. Voiding trial tomorrow.  Diabetes mellitus, type 2 A1c in January was 7.4 and repeat this admission is 6.2 - CBGs well controlled - was not taking medications prior to admission - continue sliding scale insulin for now-likely can remain diet controlled at home  Physical deconditioning / end stage Osteoarthritis of both knees Less tender today after injection. Dry tap, so septic arthritis unlikely. Appreciate orhto  Constipation: had bm today  Morbid Obesity (BMI 30.0-34.9)  Incidental finding of diastolic dysfunction without heart failure No CHF. Decrease IVF  Hypokalemia Corrected  Vaginal yeast infection: diflucan x1  Thrombocytopenia: holding heparin  Code Status: Full Family Communication: Husband at bedside 10/16 Disposition Plan/Expected LOS:   Consultants: Dr. Staci Righter (ID) [facility] for TEE Ortho: Handy   Procedures: 10/14 2-D echocardiogram: - Left ventricle:  Mild concentric hypertrophy. -LVEF= 55% to 60%. - (grade 1 diastolic dysfunction). - Pulmonary  arteries: PA peak pressure: 37 mm Hg (S) consistent with mild pulmonary hypertension  10/14 bilateral knees;Severe narrowing of all 3 compartments. Severe osteophyte formation    Antibiotics: Rocephin 10/13 >10/14 Vancomycin 10/14 > Cipro 10/15 only  HPI/Subjective: Tired. Knees still hurt. Had stool and now feels better  Objective: Blood pressure 122/56, pulse 85, temperature 97.8 F (36.6 C), temperature source Oral, resp. rate 20, height 5\' 4"  (1.626 m), weight 91.627 kg (202 lb), SpO2 98.00%.  Intake/Output Summary (Last 24 hours) at 01/31/14 1454 Last data filed at 01/31/14 1115  Gross per 24 hour  Intake    200 ml  Output    800 ml  Net   -600 ml   Exam: Gen: in bed Chest: Clear to auscultation bilaterally, room air Cardiac: Regular rate and rhythm, S1-S2, no rubs murmurs or gallops Abdomen: Soft nontender nondistended obese Extremities: no pitting edema Musculoskeletal: less tender and increased ROM  Scheduled Meds:  Scheduled Meds: . antiseptic oral rinse  7 mL Mouth Rinse BID  . feeding supplement (GLUCERNA SHAKE)  237 mL Oral BID BM  . insulin aspart  0-15 Units Subcutaneous TID WC  . meloxicam  15 mg Oral Daily  . polyethylene glycol  17 g Oral BID  . senna  2 tablet Oral Daily  . vancomycin  1,000 mg Intravenous Q12H    Data Reviewed: Basic Metabolic Panel:  Recent Labs Lab 01/27/14 1820 01/28/14 0123 01/29/14 0342  NA 136* 138 142  K 3.7 3.2* 3.9  CL 101 102 110  CO2 21 22 22   GLUCOSE 199* 187* 143*  BUN 22 32* 20  CREATININE 0.44* 0.57 0.38*  CALCIUM 8.8 8.5 8.5   Liver Function Tests:  Recent Labs Lab 01/27/14 2342  01/29/14 0342  AST 27 33  ALT 15 15  ALKPHOS 109 92  BILITOT 2.1* 1.2  PROT 6.8 6.2  ALBUMIN 1.7* 1.5*   CBC:  Recent Labs Lab 01/27/14 1820 01/28/14 0123 01/29/14 0342 01/31/14 0538  WBC 4.5 5.3 2.2* 2.5*  NEUTROABS 3.9  --   --   --   HGB 11.4* 9.9* 9.9* 9.2*  HCT 34.2* 30.1* 31.4* 28.6*  MCV 88.1 86.5  87.7 85.9  PLT 105* 100* 84* 103*   CBG:  Recent Labs Lab 01/30/14 0731 01/30/14 1125 01/30/14 1739 01/30/14 2104 01/31/14 0707  GLUCAP 106* 151* 202* 174* 108*    Recent Results (from the past 240 hour(s))  URINE CULTURE     Status: None   Collection Time    01/27/14  5:55 PM      Result Value Ref Range Status   Specimen Description URINE, CATHETERIZED   Final   Special Requests Normal   Final   Culture  Setup Time     Final   Value: 01/27/2014 22:18     Performed at [facility] Count     Final   Value: >=100,000 COLONIES/ML     Performed at [facility]   Culture     Final   Value: ESCHERICHIA COLI     Performed at [facility]   Report Status 01/30/2014 FINAL   Final   Organism ID, Bacteria ESCHERICHIA COLI   Final  CULTURE, BLOOD (ROUTINE X 2)     Status: None   Collection Time    01/27/14  6:20 PM      Result Value Ref Range Status   Specimen Description BLOOD LEFT ARM   Final   Special Requests BOTTLES DRAWN AEROBIC AND ANAEROBIC 10CC   Final   Culture  Setup Time     Final   Value: 01/27/2014 22:12     Performed at [facility]   Culture     Final   Value: STAPHYLOCOCCUS AUREUS     Note: RIFAMPIN AND GENTAMICIN SHOULD NOT BE USED AS SINGLE DRUGS FOR TREATMENT OF STAPH INFECTIONS.     Note: Gram Stain Report Called to,Read Back By and Verified With: STEPHANIE SHAW 01/28/14 0855 BY SMITHERSJ     Performed at [facility]   Report Status 01/30/2014 FINAL   Final   Organism ID, Bacteria STAPHYLOCOCCUS AUREUS   Final  CULTURE, BLOOD (ROUTINE X 2)     Status: None   Collection Time    01/27/14  6:30 PM      Result Value Ref Range Status   Specimen Description BLOOD LEFT HAND   Final   Special Requests BOTTLES DRAWN AEROBIC AND ANAEROBIC 10CC   Final   Culture  Setup Time     Final   Value: 01/27/2014 22:11     Performed at [facility]   Culture     Final   Value: STAPHYLOCOCCUS AUREUS     Note:  SUSCEPTIBILITIES PERFORMED ON PREVIOUS CULTURE WITHIN THE LAST 5 DAYS.     Note: Gram Stain Report Called to,Read Back By and Verified With: STEPHANIE SHAW 01/28/14 0855 BY SMITHERSJ     Performed at [facility]   Report Status 01/30/2014 FINAL   Final  MRSA PCR SCREENING     Status: None   Collection Time    01/27/14 11:18 PM      Result Value Ref Range Status   MRSA by  PCR NEGATIVE  NEGATIVE Final   Comment:            The GeneXpert MRSA Assay (FDA     approved for NASAL specimens     only), is one component of a     comprehensive MRSA colonization     surveillance program. It is not     intended to diagnose MRSA     infection nor to guide or     monitor treatment for     MRSA infections.     Studies:   Time spent :  25 mins  Crista Curb, MD [facility] [phone]  If 7PM-7AM, please contact night-coverage [email or web address] Password [email or web address] 01/31/2014, 2:54 PM   LOS: 4 days

## 2014-01-31 NOTE — Progress Notes (Signed)
[facility] Progress Note  Subjective  No acute ortho changes Seems to be doing better with her knees, however still refusing to move them much   Review of Systems  Respiratory: Negative for shortness of breath and wheezing.   Cardiovascular: Negative for chest pain.  Gastrointestinal: Negative for nausea, vomiting and abdominal pain.     Objective   BP 122/56  Pulse 85  Temp(Src) 97.8 F (36.6 C) (Oral)  Resp 20  Ht 5' 4"  (1.626 m)  Wt 91.627 kg (202 lb)  BMI 34.66 kg/m2  SpO2 98%  Intake/Output     10/16 0701 - 10/17 0700 10/17 0701 - 10/18 0700   P.O.     I.V. (mL/kg)     IV Piggyback  200   Total Intake(mL/kg)  200 (2.2)   Urine (mL/kg/hr) 800 (0.4)    Total Output 800     Net -800 +200          Labs  Results for Robin, Schroeder (MRN [id number]) as of 01/31/2014 15:03  Ref. Range 01/30/2014 14:20  Vit D, 25-Hydroxy Latest Range: 30-89 ng/mL 30   Results for Robin, Schroeder (MRN [id number]) as of 01/31/2014 15:03  Ref. Range 01/30/2014 14:20  Transferrin Latest Range: 200-360 mg/dL <100 (L)   Results for Robin, Schroeder (MRN [id number]) as of 01/31/2014 15:03  Ref. Range 01/30/2014 14:20  Prealbumin Latest Range: 17.0-34.0 mg/dL <3.0 (L)    Exam  Gen: awake and alert, NAD, appears comfortable  Ext:   B lower Extremities   No erythema   No effusion   Temp symmetric   Will not allow for any additional exam     Assessment and Plan   POD/HD#: 25   58 y/o female admitted for bacteremia and UTI with acute on chronic B knee pain  1. 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 Pt also has anemia panel consistent with that of chronic inflammation. Will check some basic rheumatologic labs however ESR and CRP will not provide insight as these will be elevated due to bacteremia.   2. Metabolic bone disease/malnutirion  Pt will very low albumin, prealbumin and transferrin. Likely reflective of poor nutritional intake.  Will have RD see   25 OH vitamin D is low normal, start supplementation   3. Severe physical deconditioning  Suspect pt will need SNF at dc    4. Medical issues   Per primary service   5. Morbid obesity   6. Dispo  Per medicine   Jari Pigg, PA-C [facility] [phone] (O) 01/31/2014 3:00 PM

## 2014-01-31 NOTE — Progress Notes (Addendum)
[facility]  Date of Admission:  01/27/2014  Antibiotics: vancomycin  Subjective: No acute events, ortho did a dry tap, injected for pain relief  Objective: Temp:  [97.8 F (36.6 C)-98.3 F (36.8 C)] 97.8 F (36.6 C) (10/17 0543) Pulse Rate:  [85-104] 85 (10/17 0543) Resp:  [20] 20 (10/17 0543) BP: (122-141)/(56-63) 122/56 mmHg (10/17 0543) SpO2:  [97 %-98 %] 98 % (10/17 0543) Weight:  [202 lb (91.627 kg)] 202 lb (91.627 kg) (10/16 2102)  General: awake, alert   Lab Results Lab Results  Component Value Date   WBC 2.5* 01/31/2014   HGB 9.2* 01/31/2014   HCT 28.6* 01/31/2014   MCV 85.9 01/31/2014   PLT 103* 01/31/2014    Lab Results  Component Value Date   CREATININE 0.38* 01/29/2014   BUN 20 01/29/2014   NA 142 01/29/2014   K 3.9 01/29/2014   CL 110 01/29/2014   CO2 22 01/29/2014    Lab Results  Component Value Date   ALT 15 01/29/2014   AST 33 01/29/2014   ALKPHOS 92 01/29/2014   BILITOT 1.2 01/29/2014      Microbiology: Recent Results (from the past 240 hour(s))  URINE CULTURE     Status: None   Collection Time    01/27/14  5:55 PM      Result Value Ref Range Status   Specimen Description URINE, CATHETERIZED   Final   Special Requests Normal   Final   Culture  Setup Time     Final   Value: 01/27/2014 22:18     Performed at [facility] Count     Final   Value: >=100,000 COLONIES/ML     Performed at [facility]   Culture     Final   Value: ESCHERICHIA COLI     Performed at [facility]   Report Status 01/30/2014 FINAL   Final   Organism ID, Bacteria ESCHERICHIA COLI   Final  CULTURE, BLOOD (ROUTINE X 2)     Status: None   Collection Time    01/27/14  6:20 PM      Result Value Ref Range Status   Specimen Description BLOOD LEFT ARM   Final   Special Requests BOTTLES DRAWN AEROBIC AND ANAEROBIC 10CC   Final   Culture  Setup Time     Final   Value: 01/27/2014 22:12     Performed  at [facility]   Culture     Final   Value: STAPHYLOCOCCUS AUREUS     Note: RIFAMPIN AND GENTAMICIN SHOULD NOT BE USED AS SINGLE DRUGS FOR TREATMENT OF STAPH INFECTIONS.     Note: Gram Stain Report Called to,Read Back By and Verified With: STEPHANIE SHAW 01/28/14 0855 BY SMITHERSJ     Performed at [facility]   Report Status 01/30/2014 FINAL   Final   Organism ID, Bacteria STAPHYLOCOCCUS AUREUS   Final  CULTURE, BLOOD (ROUTINE X 2)     Status: None   Collection Time    01/27/14  6:30 PM      Result Value Ref Range Status   Specimen Description BLOOD LEFT HAND   Final   Special Requests BOTTLES DRAWN AEROBIC AND ANAEROBIC 10CC   Final   Culture  Setup Time     Final   Value: 01/27/2014 22:11     Performed at [facility]   Culture     Final   Value: STAPHYLOCOCCUS  AUREUS     Note: SUSCEPTIBILITIES PERFORMED ON PREVIOUS CULTURE WITHIN THE LAST 5 DAYS.     Note: Gram Stain Report Called to,Read Back By and Verified With: STEPHANIE SHAW 01/28/14 0855 BY SMITHERSJ     Performed at [facility]   Report Status 01/30/2014 FINAL   Final  MRSA PCR SCREENING     Status: None   Collection Time    01/27/14 11:18 PM      Result Value Ref Range Status   MRSA by PCR NEGATIVE  NEGATIVE Final   Comment:            The GeneXpert MRSA Assay (FDA     approved for NASAL specimens     only), is one component of a     comprehensive MRSA colonization     surveillance program. It is not     intended to diagnose MRSA     infection nor to guide or     monitor treatment for     MRSA infections.    Studies/Results: Dg Tibia/fibula Left  01/30/2014   CLINICAL DATA:  Pain and swelling in both legs secondary to a fall. Severe arthritis. Bacteremia. Mechanical block noted on range of motion exam.  EXAM: LEFT TIBIA AND FIBULA - 2 VIEW  COMPARISON:  Knee radiographs dated 01/28/2014  FINDINGS: There is moderate arthritis at the left ankle joint with joint space narrowing and  osteophyte formation on the medial malleolus. No evidence of joint effusion. Calcification in the distal Achilles tendon. Posterior and plantar calcaneal spurs. Osteoarthritis of the knee joint.  IMPRESSION: Moderately severe osteoarthritis of the knee and ankle. No ankylosis. No acute osseous abnormalities.   Electronically Signed   By: Geanie Cooley M.D.   On: 01/30/2014 17:09   Dg Tibia/fibula Right  01/30/2014   CLINICAL DATA:  Severe arthritis admitted for bacteremia. Question septic joint versus ankylosis. No definite effusion on exam. Recent fall.  EXAM: RIGHT TIBIA AND FIBULA - 2 VIEW  COMPARISON:  01/28/2014 right knee  FINDINGS: Diffuse decreased bone mineralization is present. Moderate osteoarthritis is present over the knee. There are degenerative changes over the ankle. There is spurring over the posterior and inferior calcaneus. No definite bone destruction.  IMPRESSION: No acute findings.   Electronically Signed   By: Elberta Fortis M.D.   On: 01/30/2014 17:12   Dg Knee Complete 4 Views Left  01/30/2014   CLINICAL DATA:  Patient was severe arthritis admitted for bacteremia. Evaluate for septic joint. No definite effusion on clinical exam. Evaluate for ankylosis. Recent fall.  EXAM: LEFT KNEE - COMPLETE 4+ VIEW  COMPARISON:  01/28/2014  FINDINGS: Moderate decreased bone mineralization is present. There is moderate tricompartmental osteoarthritis. Possible small joint effusion. No definite fracture or dislocation. No definite bone destruction.  IMPRESSION: Moderate tricompartmental osteoarthritis and possible small joint effusion. No definite osteolysis or ankylosis.   Electronically Signed   By: Elberta Fortis M.D.   On: 01/30/2014 17:08   Dg Knee Complete 4 Views Right  01/30/2014   CLINICAL DATA:  Severe arthritis.  Admitted with bacteremia.  EXAM: RIGHT KNEE - COMPLETE 4+ VIEW  COMPARISON:  None.  FINDINGS: Diffuse soft tissue swelling noted. The bones are diffusely osteopenic. There is a  small suprapatellar joint effusion. Moderate tricompartment joint space narrowing and marginal spur formation is identified.  IMPRESSION: 1. Moderate to severe tricompartment osteoarthritis. 2. Joint effusion. 3. Osteopenia.   Electronically Signed   By: Veronda Prude.D.  On: 01/30/2014 17:08    Assessment/Plan: 1) MSSA bacteremia - no back pain, knees do not appear to be infected.  Penicillin allergy with hives and trouble breathing.   -continue with vancomycin -Tee Monday -picc after blood cultures negative at least 48 hours  Dr. Drue Second will follow up on Monday  Staci Righter, MD [facility] [email or web address] [phone] pager   [phone] cell 01/31/2014, 1:20 PM

## 2014-02-01 LAB — GLUCOSE, CAPILLARY
GLUCOSE-CAPILLARY: 174 mg/dL — AB (ref 70–99)
GLUCOSE-CAPILLARY: 183 mg/dL — AB (ref 70–99)
Glucose-Capillary: 182 mg/dL — ABNORMAL HIGH (ref 70–99)
Glucose-Capillary: 185 mg/dL — ABNORMAL HIGH (ref 70–99)

## 2014-02-01 LAB — CBC
HEMATOCRIT: 30 % — AB (ref 36.0–46.0)
HEMOGLOBIN: 9.6 g/dL — AB (ref 12.0–15.0)
MCH: 27.7 pg (ref 26.0–34.0)
MCHC: 32 g/dL (ref 30.0–36.0)
MCV: 86.5 fL (ref 78.0–100.0)
Platelets: 124 10*3/uL — ABNORMAL LOW (ref 150–400)
RBC: 3.47 MIL/uL — AB (ref 3.87–5.11)
RDW: 16 % — ABNORMAL HIGH (ref 11.5–15.5)
WBC: 3 10*3/uL — ABNORMAL LOW (ref 4.0–10.5)

## 2014-02-01 LAB — SEDIMENTATION RATE: Sed Rate: 115 mm/hr — ABNORMAL HIGH (ref 0–22)

## 2014-02-01 LAB — C-REACTIVE PROTEIN: CRP: 14.7 mg/dL — AB (ref <0.60)

## 2014-02-01 LAB — RHEUMATOID FACTOR: Rhuematoid fact SerPl-aCnc: >600 IU/mL — ABNORMAL HIGH (ref <=14)

## 2014-02-01 MED ORDER — SODIUM CHLORIDE 0.9 % IJ SOLN
3.0000 mL | Freq: Two times a day (BID) | INTRAMUSCULAR | Status: DC
  Administered 2014-02-01 – 2014-02-03 (×4): 3 mL via INTRAVENOUS

## 2014-02-01 MED ORDER — SODIUM CHLORIDE 0.9 % IJ SOLN: 3.0000 mL | INTRAMUSCULAR | Status: DC | PRN

## 2014-02-01 MED ORDER — SODIUM CHLORIDE 0.9 % IV SOLN
INTRAVENOUS | Status: DC
  Administered 2014-02-01: 23:00:00 via INTRAVENOUS
  Administered 2014-02-02: 500 mL via INTRAVENOUS

## 2014-02-01 MED ORDER — SODIUM CHLORIDE 0.9 % IV SOLN: 250.0000 mL | INTRAVENOUS | Status: DC | PRN

## 2014-02-01 MED ORDER — LIDOCAINE 5 % EX PTCH
2.0000 | MEDICATED_PATCH | CUTANEOUS | Status: DC
  Administered 2014-02-01 – 2014-02-04 (×4): 2 via TRANSDERMAL
  Filled 2014-02-01 (×4): qty 2

## 2014-02-01 MED ORDER — SODIUM CHLORIDE 0.9 % IJ SOLN
3.0000 mL | Freq: Two times a day (BID) | INTRAMUSCULAR | Status: DC
  Administered 2014-02-01 – 2014-02-02 (×3): 3 mL via INTRAVENOUS

## 2014-02-01 MED ORDER — HEPARIN SODIUM (PORCINE) 5000 UNIT/ML IJ SOLN
5000.0000 [IU] | Freq: Three times a day (TID) | INTRAMUSCULAR | Status: DC
Start: 2014-02-01 — End: 2014-02-04
  Administered 2014-02-01 – 2014-02-04 (×10): 5000 [IU] via SUBCUTANEOUS
  Filled 2014-02-01 (×9): qty 1

## 2014-02-01 MED ORDER — VITAMIN D (ERGOCALCIFEROL) 1.25 MG (50000 UNIT) PO CAPS
50000.0000 [IU] | ORAL_CAPSULE | ORAL | Status: DC
  Administered 2014-02-01: 50000 [IU] via ORAL
  Filled 2014-02-01: qty 1

## 2014-02-01 NOTE — Progress Notes (Signed)
Patient refused to be turned or repositioned. " Leave me alone to go to sleep". Patient's husband in the room.Patient was educated on importance of turning/repositioning at least every 2 hours to prevent from skin breakdown/pressure sore.Patient  understands but still refused turning.Will continue to monitor. Quaid Yeakle, Drinda Butts, Charity fundraiser

## 2014-02-01 NOTE — Progress Notes (Signed)
Patient incontinent of urine,attempted to clean her but patient refused,telling RN and NT to leave her be to go sleep.Patient is alert and oriented.Patient was again reminded of the importance of being keeping skin dry as not to have skin breakdown. Jas Betten, Drinda Butts, Charity fundraiser

## 2014-02-01 NOTE — Consult Note (Signed)
I have seen and examined the patient. Please see my note on full dictation.   Budd Palmer, MD 02/01/2014 12:04 PM

## 2014-02-01 NOTE — Procedures (Signed)
I have reviewed and discussed in detail with Mr. Robin Schroeder the patient's presentation, examination findings, and concurred with plan for bilateral aspiration and injection.  Myrene Galas, MD [facility] [phone] (p)

## 2014-02-01 NOTE — Progress Notes (Signed)
Patient's knee pain and other joint pain at this time appears to be related to systemic polyarthritis.  She does have loss of joint space bilaterally of the knees but overall activity level and minimal ambulation cast serious doubt on whether patient would benefit from TKA or any other orthopaedic procedure.  Rheumatologic labs have been ordered and would recommend Rheum outpatient consult with subsequent consult back to orthopaedics in the event she is deemed to be a candidate for joint replacement.  Will sign off.  Myrene Galas, MD [facility] [phone] (p)

## 2014-02-01 NOTE — Progress Notes (Signed)
Bladder scan revealed 378 ml.Patient denies any urge to urinate. Margalit Leece, Drinda Butts, Charity fundraiser

## 2014-02-01 NOTE — Progress Notes (Addendum)
Progress Note  DYANA MAGNER DJS:[id number] DOB: 15-May-1955 DOA: 01/27/2014 PCP: No PCP Per Patient  Brief narrative: 58 year old female who presented to the emergency department with complaints of generalized weakness. Patient has severe knee and hip pain and has experienced functional decline over the past few months with symptoms being worse in the past 4 days. She is also noticed fevers, chills, and  foul-smelling urine over this same time period.  Upon presentation to the ER she was tachycardic with a heart rate of 140, tachypneic with respiratory rates in the 30s. Her rectal temperature was 103 but she was normotensive.   Assessment/Plan:  Sepsis due to MSSA bacteremia. PCN allergic. Continue vancomycin.  PICC Monday if cultures remain negative per ID. TEE tomorrow at 9am. SNF would be optimal, but problematic as no insurance  Escherichia coli in urine/acute urinary retention ID stopped cipro. Foley out. Has not yet voided.  Replace if unable to void  Diabetes mellitus, type 2 A1c in January was 7.4 and repeat this admission is 6.2 - CBGs well controlled - was not taking medications prior to admission - continue sliding scale insulin for now-likely can remain diet controlled at home  Physical deconditioning / end stage Osteoarthritis of both knees Less tender today after injection. Dry tap, so septic arthritis unlikely. Appreciate ortho. ANA RF pending. Will try lidoderm patches. Very resistant to even passive ROM in bed. Per husband, this is typical.  Constipation: continue miralax  Morbid Obesity (BMI 30.0-34.9)  Incidental finding of diastolic dysfunction without heart failure No CHF. Decrease IVF  Hypokalemia Corrected  Vaginal yeast infection: s/p diflucan x1  Thrombocytopenia: improving. Resume [place] heparin  Code Status: Full Family Communication: Husband at bedside 10/16 Disposition Plan/Expected LOS:   Consultants: Dr. Staci Righter (ID) [facility] for  TEE Ortho: [facility]   Procedures: 10/14 2-D echocardiogram: - Left ventricle:  Mild concentric hypertrophy. -LVEF= 55% to 60%. - (grade 1 diastolic dysfunction). - Pulmonary arteries: PA peak pressure: 37 mm Hg (S) consistent with mild pulmonary hypertension  10/14 bilateral knees;Severe narrowing of all 3 compartments. Severe osteophyte formation    Antibiotics: Rocephin 10/13 >10/14 Vancomycin 10/14 > Cipro 10/15 only  HPI/Subjective: Knee pain improving but afraid to flex. Per techs, screams even when log rolling. Has not voided yet  Objective: Blood pressure 115/65, pulse 86, temperature 98.5 F (36.9 C), temperature source Oral, resp. rate 18, height 5\' 4"  (1.626 m), weight 92.57 kg (204 lb 1.3 oz), SpO2 98.00%.  Intake/Output Summary (Last 24 hours) at 02/01/14 0901 Last data filed at 02/01/14 0700  Gross per 24 hour  Intake    600 ml  Output    676 ml  Net    -76 ml   Exam: Gen: in bed. comfortable Chest: Clear to auscultation bilaterally, room air Cardiac: Regular rate and rhythm, S1-S2, no rubs murmurs or gallops Abdomen: Soft nontender nondistended obese Extremities: pedal edema Musculoskeletal: nontender. Resists ROM  Scheduled Meds:  Scheduled Meds: . antiseptic oral rinse  7 mL Mouth Rinse BID  . feeding supplement (GLUCERNA SHAKE)  237 mL Oral BID BM  . insulin aspart  0-15 Units Subcutaneous TID WC  . meloxicam  15 mg Oral Daily  . polyethylene glycol  17 g Oral Daily  . vancomycin  1,000 mg Intravenous Q8H    Data Reviewed: Basic Metabolic Panel:  Recent Labs Lab 01/27/14 1820 01/28/14 0123 01/29/14 0342  NA 136* 138 142  K 3.7 3.2* 3.9  CL 101 102 110  CO2  21 22 22   GLUCOSE 199* 187* 143*  BUN 22 32* 20  CREATININE 0.44* 0.57 0.38*  CALCIUM 8.8 8.5 8.5   Liver Function Tests:  Recent Labs Lab 01/27/14 2342 01/29/14 0342  AST 27 33  ALT 15 15  ALKPHOS 109 92  BILITOT 2.1* 1.2  PROT 6.8 6.2  ALBUMIN 1.7* 1.5*   CBC:  Recent  Labs Lab 01/27/14 1820 01/28/14 0123 01/29/14 0342 01/31/14 0538 02/01/14 0505  WBC 4.5 5.3 2.2* 2.5* 3.0*  NEUTROABS 3.9  --   --   --   --   HGB 11.4* 9.9* 9.9* 9.2* 9.6*  HCT 34.2* 30.1* 31.4* 28.6* 30.0*  MCV 88.1 86.5 87.7 85.9 86.5  PLT 105* 100* 84* 103* 124*   CBG:  Recent Labs Lab 01/30/14 2104 01/31/14 0707 01/31/14 1649 01/31/14 2153 02/01/14 0752  GLUCAP 174* 108* 183* 241* 174*    Recent Results (from the past 240 hour(s))  URINE CULTURE     Status: None   Collection Time    01/27/14  5:55 PM      Result Value Ref Range Status   Specimen Description URINE, CATHETERIZED   Final   Special Requests Normal   Final   Culture  Setup Time     Final   Value: 01/27/2014 22:18     Performed at 01/29/2014 Count     Final   Value: >=100,000 COLONIES/ML     Performed at [facility]   Culture     Final   Value: ESCHERICHIA COLI     Performed at [facility]   Report Status 01/30/2014 FINAL   Final   Organism ID, Bacteria ESCHERICHIA COLI   Final  CULTURE, BLOOD (ROUTINE X 2)     Status: None   Collection Time    01/27/14  6:20 PM      Result Value Ref Range Status   Specimen Description BLOOD LEFT ARM   Final   Special Requests BOTTLES DRAWN AEROBIC AND ANAEROBIC 10CC   Final   Culture  Setup Time     Final   Value: 01/27/2014 22:12     Performed at 01/29/2014   Culture     Final   Value: STAPHYLOCOCCUS AUREUS     Note: RIFAMPIN AND GENTAMICIN SHOULD NOT BE USED AS SINGLE DRUGS FOR TREATMENT OF STAPH INFECTIONS.     Note: Gram Stain Report Called to,Read Back By and Verified With: STEPHANIE SHAW 01/28/14 0855 BY SMITHERSJ     Performed at 01/30/14   Report Status 01/30/2014 FINAL   Final   Organism ID, Bacteria STAPHYLOCOCCUS AUREUS   Final  CULTURE, BLOOD (ROUTINE X 2)     Status: None   Collection Time    01/27/14  6:30 PM      Result Value Ref Range Status   Specimen Description BLOOD LEFT HAND    Final   Special Requests BOTTLES DRAWN AEROBIC AND ANAEROBIC 10CC   Final   Culture  Setup Time     Final   Value: 01/27/2014 22:11     Performed at 01/29/2014   Culture     Final   Value: STAPHYLOCOCCUS AUREUS     Note: SUSCEPTIBILITIES PERFORMED ON PREVIOUS CULTURE WITHIN THE LAST 5 DAYS.     Note: Gram Stain Report Called to,Read Back By and Verified With: STEPHANIE SHAW 01/28/14 0855 BY SMITHERSJ     Performed at 01/30/14  [facility]   Report Status 01/30/2014 FINAL   Final  MRSA PCR SCREENING     Status: None   Collection Time    01/27/14 11:18 PM      Result Value Ref Range Status   MRSA by PCR NEGATIVE  NEGATIVE Final   Comment:            The GeneXpert MRSA Assay (FDA     approved for NASAL specimens     only), is one component of a     comprehensive MRSA colonization     surveillance program. It is not     intended to diagnose MRSA     infection nor to guide or     monitor treatment for     MRSA infections.  CULTURE, BLOOD (ROUTINE X 2)     Status: None   Collection Time    01/30/14  5:00 AM      Result Value Ref Range Status   Specimen Description BLOOD LEFT ARM   Final   Special Requests BOTTLES DRAWN AEROBIC AND ANAEROBIC 5CC   Final   Culture  Setup Time     Final   Value: 01/30/2014 08:46     Performed at [facility]   Culture     Final   Value:        BLOOD CULTURE RECEIVED NO GROWTH TO DATE CULTURE WILL BE HELD FOR 5 DAYS BEFORE ISSUING A FINAL NEGATIVE REPORT     Performed at [facility]   Report Status PENDING   Incomplete  CULTURE, BLOOD (ROUTINE X 2)     Status: None   Collection Time    01/30/14  5:10 AM      Result Value Ref Range Status   Specimen Description BLOOD LEFT HAND   Final   Special Requests BOTTLES DRAWN AEROBIC AND ANAEROBIC 5CC   Final   Culture  Setup Time     Final   Value: 01/30/2014 08:47     Performed at [facility]   Culture     Final   Value:        BLOOD CULTURE RECEIVED NO GROWTH TO  DATE CULTURE WILL BE HELD FOR 5 DAYS BEFORE ISSUING A FINAL NEGATIVE REPORT     Performed at [facility]   Report Status PENDING   Incomplete     Studies:   Time spent :  25 mins  Jeromiah Ohalloran, MD [facility] [phone]  If 7PM-7AM, please contact night-coverage [email or web address] Password [email or web address] 02/01/2014, 9:01 AM   LOS: 5 days   

## 2014-02-02 ENCOUNTER — Encounter (HOSPITAL_COMMUNITY)
Admission: EM | Disposition: A | Discharge status: Skilled Nursing Facility | Payer: Self-pay | Source: Home / Self Care | Attending: Internal Medicine

## 2014-02-02 ENCOUNTER — Encounter (HOSPITAL_COMMUNITY): Payer: Self-pay | Admitting: *Deleted

## 2014-02-02 DIAGNOSIS — I341 Nonrheumatic mitral (valve) prolapse: Secondary | ICD-10-CM

## 2014-02-02 DIAGNOSIS — B9561 Methicillin susceptible Staphylococcus aureus infection as the cause of diseases classified elsewhere: Secondary | ICD-10-CM

## 2014-02-02 DIAGNOSIS — R7881 Bacteremia: Secondary | ICD-10-CM | POA: Diagnosis present

## 2014-02-02 DIAGNOSIS — E8809 Other disorders of plasma-protein metabolism, not elsewhere classified: Secondary | ICD-10-CM | POA: Diagnosis present

## 2014-02-02 DIAGNOSIS — R609 Edema, unspecified: Secondary | ICD-10-CM | POA: Diagnosis present

## 2014-02-02 HISTORY — PX: TEE WITHOUT CARDIOVERSION: SHX5443

## 2014-02-02 LAB — CBC
HCT: 29.6 % — ABNORMAL LOW (ref 36.0–46.0)
HEMOGLOBIN: 9.6 g/dL — AB (ref 12.0–15.0)
MCH: 27.9 pg (ref 26.0–34.0)
MCHC: 32.4 g/dL (ref 30.0–36.0)
MCV: 86 fL (ref 78.0–100.0)
Platelets: 160 10*3/uL (ref 150–400)
RBC: 3.44 MIL/uL — AB (ref 3.87–5.11)
RDW: 15.9 % — ABNORMAL HIGH (ref 11.5–15.5)
WBC: 3.3 10*3/uL — ABNORMAL LOW (ref 4.0–10.5)

## 2014-02-02 LAB — GLUCOSE, CAPILLARY
GLUCOSE-CAPILLARY: 133 mg/dL — AB (ref 70–99)
Glucose-Capillary: 122 mg/dL — ABNORMAL HIGH (ref 70–99)
Glucose-Capillary: 135 mg/dL — ABNORMAL HIGH (ref 70–99)
Glucose-Capillary: 181 mg/dL — ABNORMAL HIGH (ref 70–99)

## 2014-02-02 LAB — ANTI-NUCLEAR AB-TITER (ANA TITER): ANA Titer 1: 1:160 {titer} — ABNORMAL HIGH

## 2014-02-02 LAB — CYCLIC CITRUL PEPTIDE ANTIBODY, IGG: Cyclic Citrullin Peptide Ab: >300 U/mL — ABNORMAL HIGH (ref 0.0–5.0)

## 2014-02-02 LAB — BASIC METABOLIC PANEL
Anion gap: 9 (ref 5–15)
BUN: 13 mg/dL (ref 6–23)
CHLORIDE: 106 meq/L (ref 96–112)
CO2: 25 mEq/L (ref 19–32)
CREATININE: 0.37 mg/dL — AB (ref 0.50–1.10)
Calcium: 7.9 mg/dL — ABNORMAL LOW (ref 8.4–10.5)
GFR calc Af Amer: >90 mL/min (ref >90)
GFR calc non Af Amer: >90 mL/min (ref >90)
GLUCOSE: 130 mg/dL — AB (ref 70–99)
POTASSIUM: 3.7 meq/L (ref 3.7–5.3)
Sodium: 140 mEq/L (ref 137–147)

## 2014-02-02 LAB — ANA: Anti Nuclear Antibody(ANA): POSITIVE — AB

## 2014-02-02 LAB — VANCOMYCIN, TROUGH: Vancomycin Tr: 19.1 ug/mL (ref 10.0–20.0)

## 2014-02-02 SURGERY — ECHOCARDIOGRAM, TRANSESOPHAGEAL
Anesthesia: Moderate Sedation

## 2014-02-02 MED ORDER — POTASSIUM CHLORIDE CRYS ER 20 MEQ PO TBCR
40.0000 meq | EXTENDED_RELEASE_TABLET | Freq: Once | ORAL | Status: AC
Start: 2014-02-02 — End: 2014-02-02
  Administered 2014-02-02: 40 meq via ORAL
  Filled 2014-02-02: qty 2

## 2014-02-02 MED ORDER — FENTANYL CITRATE 0.05 MG/ML IJ SOLN
INTRAMUSCULAR | Status: DC | PRN
  Administered 2014-02-02: 25 ug via INTRAVENOUS

## 2014-02-02 MED ORDER — DIPHENHYDRAMINE HCL 50 MG/ML IJ SOLN
INTRAMUSCULAR | Status: AC
  Filled 2014-02-02: qty 1

## 2014-02-02 MED ORDER — MIDAZOLAM HCL 10 MG/2ML IJ SOLN
INTRAMUSCULAR | Status: DC | PRN
Start: 2014-02-02 — End: 2014-02-02
  Administered 2014-02-02: 2 mg via INTRAVENOUS
  Administered 2014-02-02: 1 mg via INTRAVENOUS

## 2014-02-02 MED ORDER — PREDNISONE 50 MG PO TABS
60.0000 mg | ORAL_TABLET | Freq: Every day | ORAL | Status: DC
  Administered 2014-02-02 – 2014-02-03 (×2): 60 mg via ORAL
  Filled 2014-02-02 (×4): qty 1

## 2014-02-02 MED ORDER — VANCOMYCIN HCL 10 G IV SOLR
1500.0000 mg | Freq: Two times a day (BID) | INTRAVENOUS | Status: DC
  Administered 2014-02-02 – 2014-02-04 (×5): 1500 mg via INTRAVENOUS
  Filled 2014-02-02 (×7): qty 1500

## 2014-02-02 MED ORDER — FENTANYL CITRATE 0.05 MG/ML IJ SOLN
INTRAMUSCULAR | Status: AC
  Filled 2014-02-02: qty 2

## 2014-02-02 MED ORDER — GLUCERNA SHAKE PO LIQD
237.0000 mL | Freq: Three times a day (TID) | ORAL | Status: DC
  Administered 2014-02-02 – 2014-02-04 (×6): 237 mL via ORAL

## 2014-02-02 MED ORDER — MIDAZOLAM HCL 5 MG/ML IJ SOLN
INTRAMUSCULAR | Status: AC
  Filled 2014-02-02: qty 2

## 2014-02-02 MED ORDER — FUROSEMIDE 40 MG PO TABS
40.0000 mg | ORAL_TABLET | Freq: Every day | ORAL | Status: DC
  Administered 2014-02-02 – 2014-02-03 (×2): 40 mg via ORAL
  Filled 2014-02-02 (×3): qty 1

## 2014-02-02 MED ORDER — BUTAMBEN-TETRACAINE-BENZOCAINE 2-2-14 % EX AERO
INHALATION_SPRAY | CUTANEOUS | Status: DC | PRN
  Administered 2014-02-02: 2 via TOPICAL

## 2014-02-02 NOTE — Plan of Care (Signed)
Problem: Phase II Progression Outcomes Goal: Progress activity as tolerated unless otherwise ordered Outcome: Progressing Pt OOB to recliner chair for dinner. Pt required 2 person assist with hoyer lift. Will continue to monitor.

## 2014-02-02 NOTE — CV Procedure (Signed)
   TEE  Indication: MSSA bacteremia  No endocarditis, no vegetation.   Normal EF Mild MR  Discussed with patient.  Reassuring.   Donato Schultz, MD

## 2014-02-02 NOTE — Progress Notes (Signed)
D/w Dr. Drue Second.   Progress Note  Robin Schroeder EPP:[id number] DOB: 1955-11-14 DOA: 01/27/2014 PCP: No PCP Per Patient  Brief narrative: 58 year old female who presented to the emergency department with complaints of generalized weakness. Patient has severe knee and hip pain and has experienced functional decline over the past few months with symptoms being worse in the past 4 days. She is also noticed fevers, chills, and  foul-smelling urine over this same time period.  Upon presentation to the ER she was tachycardic with a heart rate of 140, tachypneic with respiratory rates in the 30s. Her rectal temperature was 103 but she was normotensive.   Assessment/Plan:  Sepsis due to MSSA bacteremia. PCN allergic. Continue vancomycin.  Await picc. 2 weeks vancomycin starting 10/16. TEE neg  Escherichia coli in urine/acute urinary retention ID stopped cipro. Foley out.  Voiding ok  Diabetes mellitus, type 2 A1c in January was 7.4 and repeat this admission is 6.2 - CBGs well controlled - was not taking medications prior to admission - continue sliding scale insulin for now consider metformin at discharge  Physical deconditioning / end stage Osteoarthritis of both knees Less tender today after injection. Dry tap, so septic arthritis unlikely. S/p injection. Slightly improved after injection and lidoderm, but still largely bedbound. ANA pos (homogenous) and RF pos. Will check anti ds DNA, anti sm ab, complement. Start steroid pulse and f/u rheum as outpt. May need SNF.  Constipation: continue miralax  Morbid Obesity (BMI 30.0-34.9)  Incidental finding of diastolic dysfunction without heart failure No CHF. Weight up and pedal edema. Will give lasix x 3 days.   Hypokalemia Corrected  Vaginal yeast infection: s/p diflucan x1  Thrombocytopenia: resolved  Edema  Hypoalbuminemia  Code Status: Full Family Communication: Husband at bedside 10/16 Disposition Plan/Expected LOS: discharge  snf v. Home when arranged  Consultants: Dr. Staci Righter (ID) [facility] for TEE Ortho: Handy   Procedures: 10/14 2-D echocardiogram: - Left ventricle:  Mild concentric hypertrophy. -LVEF= 55% to 60%. - (grade 1 diastolic dysfunction). - Pulmonary arteries: PA peak pressure: 37 mm Hg (S) consistent with mild pulmonary hypertension  10/14 bilateral knees;Severe narrowing of all 3 compartments. Severe osteophyte formation  Aspiration both knees, dry tap. injection  Antibiotics: Rocephin 10/13 >10/14 Vancomycin 10/14 > Cipro 10/15 only  HPI/Subjective: Knee pain improving. Voiding after foley out. Having stools  Objective: Blood pressure 107/80, pulse 105, temperature 98.7 F (37.1 C), temperature source Oral, resp. rate 21, height 5\' 4"  (1.626 m), weight 93.532 kg (206 lb 3.2 oz), SpO2 94.00%.  Intake/Output Summary (Last 24 hours) at 02/02/14 1549 Last data filed at 02/02/14 0700  Gross per 24 hour  Intake 406.67 ml  Output      1 ml  Net 405.67 ml   Exam: Gen: in bed. comfortable Chest: Clear to auscultation bilaterally, room air Cardiac: Regular rate and rhythm, S1-S2, no rubs murmurs or gallops Abdomen: Soft nontender nondistended obese Extremities: pedal edema Musculoskeletal: nontender. Increased rom  Scheduled Meds:  Scheduled Meds: . antiseptic oral rinse  7 mL Mouth Rinse BID  . feeding supplement (GLUCERNA SHAKE)  237 mL Oral TID BM  . furosemide  40 mg Oral Daily  . heparin subcutaneous  5,000 Units Subcutaneous 3 times per day  . insulin aspart  0-15 Units Subcutaneous TID WC  . lidocaine  2 patch Transdermal Q24H  . meloxicam  15 mg Oral Daily  . polyethylene glycol  17 g Oral Daily  . potassium chloride  40 mEq  Oral Once  . predniSONE  60 mg Oral Q breakfast  . sodium chloride  3 mL Intravenous Q12H  . sodium chloride  3 mL Intravenous Q12H  . vancomycin  1,500 mg Intravenous Q12H  . Vitamin D (Ergocalciferol)  50,000 Units Oral Q7 days     Data Reviewed: Basic Metabolic Panel:  Recent Labs Lab 01/27/14 1820 01/28/14 0123 01/29/14 0342 02/02/14 0550  NA 136* 138 142 140  K 3.7 3.2* 3.9 3.7  CL 101 102 110 106  CO2 21 22 22 25   GLUCOSE 199* 187* 143* 130*  BUN 22 32* 20 13  CREATININE 0.44* 0.57 0.38* 0.37*  CALCIUM 8.8 8.5 8.5 7.9*   Liver Function Tests:  Recent Labs Lab 01/27/14 2342 01/29/14 0342  AST 27 33  ALT 15 15  ALKPHOS 109 92  BILITOT 2.1* 1.2  PROT 6.8 6.2  ALBUMIN 1.7* 1.5*   CBC:  Recent Labs Lab 01/27/14 1820 01/28/14 0123 01/29/14 0342 01/31/14 0538 02/01/14 0505 02/02/14 0550  WBC 4.5 5.3 2.2* 2.5* 3.0* 3.3*  NEUTROABS 3.9  --   --   --   --   --   HGB 11.4* 9.9* 9.9* 9.2* 9.6* 9.6*  HCT 34.2* 30.1* 31.4* 28.6* 30.0* 29.6*  MCV 88.1 86.5 87.7 85.9 86.5 86.0  PLT 105* 100* 84* 103* 124* 160   CBG:  Recent Labs Lab 02/01/14 1209 02/01/14 1645 02/01/14 2133 02/02/14 0750 02/02/14 1225  GLUCAP 183* 182* 185* 133* 122*    Recent Results (from the past 240 hour(s))  URINE CULTURE     Status: None   Collection Time    01/27/14  5:55 PM      Result Value Ref Range Status   Specimen Description URINE, CATHETERIZED   Final   Special Requests Normal   Final   Culture  Setup Time     Final   Value: 01/27/2014 22:18     Performed at [facility] Count     Final   Value: >=100,000 COLONIES/ML     Performed at [facility]   Culture     Final   Value: ESCHERICHIA COLI     Performed at [facility]   Report Status 01/30/2014 FINAL   Final   Organism ID, Bacteria ESCHERICHIA COLI   Final  CULTURE, BLOOD (ROUTINE X 2)     Status: None   Collection Time    01/27/14  6:20 PM      Result Value Ref Range Status   Specimen Description BLOOD LEFT ARM   Final   Special Requests BOTTLES DRAWN AEROBIC AND ANAEROBIC 10CC   Final   Culture  Setup Time     Final   Value: 01/27/2014 22:12     Performed at [facility]   Culture      Final   Value: STAPHYLOCOCCUS AUREUS     Note: RIFAMPIN AND GENTAMICIN SHOULD NOT BE USED AS SINGLE DRUGS FOR TREATMENT OF STAPH INFECTIONS.     Note: Gram Stain Report Called to,Read Back By and Verified With: STEPHANIE SHAW 01/28/14 0855 BY [email or web address]     Performed at [facility]   Report Status 01/30/2014 FINAL   Final   Organism ID, Bacteria STAPHYLOCOCCUS AUREUS   Final  CULTURE, BLOOD (ROUTINE X 2)     Status: None   Collection Time    01/27/14  6:30 PM      Result Value Ref Range Status  Specimen Description BLOOD LEFT HAND   Final   Special Requests BOTTLES DRAWN AEROBIC AND ANAEROBIC 10CC   Final   Culture  Setup Time     Final   Value: 01/27/2014 22:11     Performed at [facility]   Culture     Final   Value: STAPHYLOCOCCUS AUREUS     Note: SUSCEPTIBILITIES PERFORMED ON PREVIOUS CULTURE WITHIN THE LAST 5 DAYS.     Note: Gram Stain Report Called to,Read Back By and Verified With: STEPHANIE SHAW 01/28/14 0855 BY [email or web address]     Performed at [facility]   Report Status 01/30/2014 FINAL   Final  MRSA PCR SCREENING     Status: None   Collection Time    01/27/14 11:18 PM      Result Value Ref Range Status   MRSA by PCR NEGATIVE  NEGATIVE Final   Comment:            The GeneXpert MRSA Assay (FDA     approved for NASAL specimens     only), is one component of a     comprehensive MRSA colonization     surveillance program. It is not     intended to diagnose MRSA     infection nor to guide or     monitor treatment for     MRSA infections.  CULTURE, BLOOD (ROUTINE X 2)     Status: None   Collection Time    01/30/14  5:00 AM      Result Value Ref Range Status   Specimen Description BLOOD LEFT ARM   Final   Special Requests BOTTLES DRAWN AEROBIC AND ANAEROBIC 5CC   Final   Culture  Setup Time     Final   Value: 01/30/2014 08:46     Performed at [facility]   Culture     Final   Value:        BLOOD CULTURE RECEIVED NO GROWTH TO DATE CULTURE  WILL BE HELD FOR 5 DAYS BEFORE ISSUING A FINAL NEGATIVE REPORT     Performed at [facility]   Report Status PENDING   Incomplete  CULTURE, BLOOD (ROUTINE X 2)     Status: None   Collection Time    01/30/14  5:10 AM      Result Value Ref Range Status   Specimen Description BLOOD LEFT HAND   Final   Special Requests BOTTLES DRAWN AEROBIC AND ANAEROBIC 5CC   Final   Culture  Setup Time     Final   Value: 01/30/2014 08:47     Performed at [facility]   Culture     Final   Value:        BLOOD CULTURE RECEIVED NO GROWTH TO DATE CULTURE WILL BE HELD FOR 5 DAYS BEFORE ISSUING A FINAL NEGATIVE REPORT     Performed at [facility]   Report Status PENDING   Incomplete     Studies:   Time spent :  35 mins  Crista Curb, MD [facility] [phone]  If 7PM-7AM, please contact night-coverage [email or web address] Password [email or web address] 02/02/2014, 3:49 PM   LOS: 6 days

## 2014-02-02 NOTE — Progress Notes (Signed)
ANTIBIOTIC CONSULT NOTE - FOLLOW UP  Pharmacy Consult for Vancomycin Indication: MSSA Bacteremia  Allergies  Allergen Reactions  . Penicillins Rash    Patient Measurements: Height: 5\' 4"  (162.6 cm) Weight: 206 lb 3.2 oz (93.532 kg) IBW/kg (Calculated) : 54.7  Vital Signs: Temp: 98.7 F (37.1 C) (10/19 0819) Temp Source: Oral (10/19 0819) BP: 144/62 mmHg (10/19 0819) Pulse Rate: 101 (10/19 0751) Intake/Output from previous day: 10/18 0701 - 10/19 0700 In: 729.7 [P.O.:360; I.V.:169.7; IV Piggyback:200] Out: 251 [Urine:250; Stool:1] Intake/Output from this shift:    Labs:  Recent Labs  01/31/14 0538 02/01/14 0505 02/02/14 0550  WBC 2.5* 3.0* 3.3*  HGB 9.2* 9.6* 9.6*  PLT 103* 124* 160  CREATININE  --   --  0.37*   Estimated Creatinine Clearance: 83.9 ml/min (by C-G formula based on Cr of 0.37).  Recent Labs  01/31/14 2123 02/02/14 0550  VANCOTROUGH 9.9* 19.1     Microbiology: Recent Results (from the past 720 hour(s))  URINE CULTURE     Status: None   Collection Time    01/27/14  5:55 PM      Result Value Ref Range Status   Specimen Description URINE, CATHETERIZED   Final   Special Requests Normal   Final   Culture  Setup Time     Final   Value: 01/27/2014 22:18     Performed at 01/29/2014 Count     Final   Value: >=100,000 COLONIES/ML     Performed at [facility]   Culture     Final   Value: ESCHERICHIA COLI     Performed at [facility]   Report Status 01/30/2014 FINAL   Final   Organism ID, Bacteria ESCHERICHIA COLI   Final  CULTURE, BLOOD (ROUTINE X 2)     Status: None   Collection Time    01/27/14  6:20 PM      Result Value Ref Range Status   Specimen Description BLOOD LEFT ARM   Final   Special Requests BOTTLES DRAWN AEROBIC AND ANAEROBIC 10CC   Final   Culture  Setup Time     Final   Value: 01/27/2014 22:12     Performed at 01/29/2014   Culture     Final   Value: STAPHYLOCOCCUS AUREUS   Note: RIFAMPIN AND GENTAMICIN SHOULD NOT BE USED AS SINGLE DRUGS FOR TREATMENT OF STAPH INFECTIONS.     Note: Gram Stain Report Called to,Read Back By and Verified With: STEPHANIE SHAW 01/28/14 0855 BY [email or web address]     Performed at 01/30/14   Report Status 01/30/2014 FINAL   Final   Organism ID, Bacteria STAPHYLOCOCCUS AUREUS   Final  CULTURE, BLOOD (ROUTINE X 2)     Status: None   Collection Time    01/27/14  6:30 PM      Result Value Ref Range Status   Specimen Description BLOOD LEFT HAND   Final   Special Requests BOTTLES DRAWN AEROBIC AND ANAEROBIC 10CC   Final   Culture  Setup Time     Final   Value: 01/27/2014 22:11     Performed at 01/29/2014   Culture     Final   Value: STAPHYLOCOCCUS AUREUS     Note: SUSCEPTIBILITIES PERFORMED ON PREVIOUS CULTURE WITHIN THE LAST 5 DAYS.     Note: Gram Stain Report Called to,Read Back By and Verified With: STEPHANIE SHAW 01/28/14 0855 BY [email or web address]  Performed at [facility]   Report Status 01/30/2014 FINAL   Final  MRSA PCR SCREENING     Status: None   Collection Time    01/27/14 11:18 PM      Result Value Ref Range Status   MRSA by PCR NEGATIVE  NEGATIVE Final   Comment:            The GeneXpert MRSA Assay (FDA     approved for NASAL specimens     only), is one component of a     comprehensive MRSA colonization     surveillance program. It is not     intended to diagnose MRSA     infection nor to guide or     monitor treatment for     MRSA infections.  CULTURE, BLOOD (ROUTINE X 2)     Status: None   Collection Time    01/30/14  5:00 AM      Result Value Ref Range Status   Specimen Description BLOOD LEFT ARM   Final   Special Requests BOTTLES DRAWN AEROBIC AND ANAEROBIC 5CC   Final   Culture  Setup Time     Final   Value: 01/30/2014 08:46     Performed at [facility]   Culture     Final   Value:        BLOOD CULTURE RECEIVED NO GROWTH TO DATE CULTURE WILL BE HELD FOR 5 DAYS BEFORE ISSUING A  FINAL NEGATIVE REPORT     Performed at [facility]   Report Status PENDING   Incomplete  CULTURE, BLOOD (ROUTINE X 2)     Status: None   Collection Time    01/30/14  5:10 AM      Result Value Ref Range Status   Specimen Description BLOOD LEFT HAND   Final   Special Requests BOTTLES DRAWN AEROBIC AND ANAEROBIC 5CC   Final   Culture  Setup Time     Final   Value: 01/30/2014 08:47     Performed at [facility]   Culture     Final   Value:        BLOOD CULTURE RECEIVED NO GROWTH TO DATE CULTURE WILL BE HELD FOR 5 DAYS BEFORE ISSUING A FINAL NEGATIVE REPORT     Performed at [facility]   Report Status PENDING   Incomplete    Anti-infectives   Start     Dose/Rate Route Frequency Ordered Stop   02/01/14 0600  vancomycin (VANCOCIN) IVPB 1000 mg/200 mL premix     1,000 mg 200 mL/hr over 60 Minutes Intravenous Every 8 hours 01/31/14 2221     01/30/14 1300  fluconazole (DIFLUCAN) tablet 150 mg     150 mg Oral  Once 01/30/14 1122 01/30/14 2348   01/29/14 0800  ciprofloxacin (CIPRO) IVPB 400 mg  Status:  Discontinued     400 mg 200 mL/hr over 60 Minutes Intravenous Every 12 hours 01/29/14 0752 01/29/14 1603   01/28/14 1845  cefTRIAXone (ROCEPHIN) 1 g in dextrose 5 % 50 mL IVPB  Status:  Discontinued     1 g 100 mL/hr over 30 Minutes Intravenous Every 24 hours 01/27/14 2026 01/28/14 1352   01/28/14 1430  vancomycin (VANCOCIN) IVPB 1000 mg/200 mL premix  Status:  Discontinued     1,000 mg 200 mL/hr over 60 Minutes Intravenous Every 12 hours 01/28/14 1352 01/31/14 2221   01/27/14 1845  cefTRIAXone (ROCEPHIN) 1 g in dextrose 5 % 50  mL IVPB     1 g 100 mL/hr over 30 Minutes Intravenous  Once 01/27/14 1834 01/27/14 1910      Assessment: 57 YOF who continues on Vancomycin for MSSA bacteremia - awaiting further work-up by ID. Still evaluating for source, planning TEE on 10/19. Her Vancomycin trough is at the upper end of the therapeutic range and with a large increase  after changing from a q12h regimen to a q8h regimen.  As a prolonged course of Vancomycin is anticipated I will try to change her back to a q12h regimen.  Renal function stable.  Goal of Therapy:  Vancomycin trough level 15-20 mcg/ml  Plan:  Adjust Vancomycin to 1.5g IV every 12 hours Recheck Vancomycin trough at steady state Will continue to follow renal function, culture results, LOT, and antibiotic de-escalation plans   Estella Husk, Pharm.D., BCPS, AAHIVP Clinical Pharmacist Phone: [phone] or [phone] 02/02/2014, 10:02 AM

## 2014-02-02 NOTE — Interval H&P Note (Signed)
History and Physical Interval Note:  02/02/2014 11:33 AM  Robin Schroeder  has presented today for surgery, with the diagnosis of BACTEREMIA  The various methods of treatment have been discussed with the patient and family. After consideration of risks, benefits and other options for treatment, the patient has consented to  Procedure(s): TRANSESOPHAGEAL ECHOCARDIOGRAM (TEE) (N/A) as a surgical intervention .  The patient's history has been reviewed, patient examined, no change in status, stable for surgery.  I have reviewed the patient's chart and labs.  Questions were answered to the patient's satisfaction.     Ajaya Crutchfield

## 2014-02-02 NOTE — Progress Notes (Addendum)
NUTRITION FOLLOW-UP  DOCUMENTATION CODES Per approved criteria  -Obesity Unspecified   INTERVENTION: Glucerna Shake po TID, each supplement provides 220 kcal and 10 grams of protein  NUTRITION DIAGNOSIS: Inadequate oral intake related to decreased appetite as evidenced by diet hx, poor po intake x 1 week; ongoing  Goal: Pt will meet >90% of estimated nutritional needs; not met  Monitor:  PO intake, labs, weight changes, I/O's   58 y.o. female  Admitting Dx: Sepsis  58 year old female who presented to the emergency department with complaints of generalized weakness. Patient has severe knee and hip pain and has experienced functional decline over the past few months with symptoms being worse in the past 4 days. She is also noticed fevers chills foul-smelling urine over this same time period.  ASSESSMENT: 10/14-Pt reports poor appetite for approximately one week, due to illness. Pt reports she ate "a little bit" of lunch today- a few bites of fruit and baked ziti. She denies any difficulty chewing or swallowing. Prior to this week, she reveals her appetite is fairly good. Diet recall PTA is as follows: Breakfast: fruit (orange, tangerine, or banana); Lunch: fried chicken and salad OR pizza; Dinner: tomato slices. She denies snacking between meals or at night. Beverages consist of only water. Her husband does most of the food preparation at home. Per pt, she eliminated sugar sweetened and caffeinated beverages in January 2015, when she was diagnosed with DM and HTN at a local urgent care clinic.  Pt reports she has lost weight, however, difficult to determine intentional vs unintentional wt loss. No wt hx available in EPIC. Pt reports she weighed 314# back in 2006, however, has decreased portions and tried to make healthier food choices, as she expressed that her obesity was affecting her health. She expressed concern over prevalence of heart disease in her family. She reports that she knows  she has lost weight, as her rings are too large to fit securely on her fingers.  Educated pt on importance of PO intake to promote healing process. Pt is agreeable to try supplements during her hospitalization. Given suboptimal Hgb A1c levels and high sodium food choices in diet hx, pt caregivers may benefit from diet education if pt is discharged back home.  Pt husband desires placement for pt pending PT/OT evaluations. Case Management following to determine if this is viable option, as pt is uninsured.  Labs reviewed. K: 3.2. BUN: 32. Glucose: 187, CBGs: 127-298 (trending down). Most recent Hgb A1c: 7.4 back in 04/2013. Pt does not take any medications for DM PTA; currently receiving insulin as an inpatient.   10/19- RD consulted for pt poor po intake. Meal completion has been 0-25%. Pt had TEE procedure today. Pt reports her appetite has been improving and that she is feeling more hungry. Pt has been drinking her glucerna shakes. Pt is agreeable to increasing her Glucerna Shake orders to TID to provide extra calorie and protein needs. Pt was encouraged to eat her food at meals and to drink her supplements.  Labs: Low creatinine and calcium. Height: Ht Readings from Last 1 Encounters:  02/01/14 _0  (1.626 m)    Weight: Wt Readings from Last 1 Encounters:  02/01/14 206 lb 3.2 oz (93.532 kg)   BMI:  Body mass index is 35.38 kg/(m^2). Obesity, class I  Re-Estimated Nutritional Needs: Kcal: 1900-2100 Protein: 85-95 grams Fluid: 1.9-2.1 L  Skin: open area between buttocks, MASD on lt and rt groin, non-pitting UE edema, +1 LE edema.  Diet Order:  Full Liquid   Intake/Output Summary (Last 24 hours) at 02/02/14 1410 Last data filed at 02/02/14 0700  Gross per 24 hour  Intake 529.67 ml  Output      1 ml  Net 528.67 ml    Last BM: 10/17  Labs:   Recent Labs Lab 01/28/14 0123 01/29/14 0342 02/02/14 0550  NA 138 142 140  K 3.2* 3.9 3.7  CL 102 110 106  CO2 _0 BUN 32*  20 13  CREATININE 0.57 0.38* 0.37*  CALCIUM 8.5 8.5 7.9*  GLUCOSE 187* 143* 130*    CBG (last 3)   Recent Labs  02/01/14 2133 02/02/14 0750 02/02/14 1225  GLUCAP 185* 133* 122*   Lab Results  Component Value Date   HGBA1C 6.2* 01/28/2014   Scheduled Meds: . antiseptic oral rinse  7 mL Mouth Rinse BID  . feeding supplement (GLUCERNA SHAKE)  237 mL Oral BID BM  . heparin subcutaneous  5,000 Units Subcutaneous 3 times per day  . insulin aspart  0-15 Units Subcutaneous TID WC  . lidocaine  2 patch Transdermal Q24H  . meloxicam  15 mg Oral Daily  . polyethylene glycol  17 g Oral Daily  . sodium chloride  3 mL Intravenous Q12H  . sodium chloride  3 mL Intravenous Q12H  . vancomycin  1,500 mg Intravenous Q12H  . Vitamin D (Ergocalciferol)  50,000 Units Oral Q7 days    Continuous Infusions:    Past Medical History  Diagnosis Date  . Hypertension   . Diabetes mellitus without complication   . Arthritis     History reviewed. No pertinent past surgical history.  Kallie Locks, MS, RD, LDN Pager # [phone] After hours/ weekend pager # [phone]

## 2014-02-02 NOTE — H&P (View-Only) (Signed)
Progress Note  Robin Schroeder DJS:[id number] DOB: 15-May-1955 DOA: 01/27/2014 PCP: No PCP Per Patient  Brief narrative: 58 year old female who presented to the emergency department with complaints of generalized weakness. Patient has severe knee and hip pain and has experienced functional decline over the past few months with symptoms being worse in the past 4 days. She is also noticed fevers, chills, and  foul-smelling urine over this same time period.  Upon presentation to the ER she was tachycardic with a heart rate of 140, tachypneic with respiratory rates in the 30s. Her rectal temperature was 103 but she was normotensive.   Assessment/Plan:  Sepsis due to MSSA bacteremia. PCN allergic. Continue vancomycin.  PICC Monday if cultures remain negative per ID. TEE tomorrow at 9am. SNF would be optimal, but problematic as no insurance  Escherichia coli in urine/acute urinary retention ID stopped cipro. Foley out. Has not yet voided.  Replace if unable to void  Diabetes mellitus, type 2 A1c in January was 7.4 and repeat this admission is 6.2 - CBGs well controlled - was not taking medications prior to admission - continue sliding scale insulin for now-likely can remain diet controlled at home  Physical deconditioning / end stage Osteoarthritis of both knees Less tender today after injection. Dry tap, so septic arthritis unlikely. Appreciate ortho. ANA RF pending. Will try lidoderm patches. Very resistant to even passive ROM in bed. Per husband, this is typical.  Constipation: continue miralax  Morbid Obesity (BMI 30.0-34.9)  Incidental finding of diastolic dysfunction without heart failure No CHF. Decrease IVF  Hypokalemia Corrected  Vaginal yeast infection: s/p diflucan x1  Thrombocytopenia: improving. Resume [place] heparin  Code Status: Full Family Communication: Husband at bedside 10/16 Disposition Plan/Expected LOS:   Consultants: Dr. Staci Righter (ID) [facility] for  TEE Ortho: [facility]   Procedures: 10/14 2-D echocardiogram: - Left ventricle:  Mild concentric hypertrophy. -LVEF= 55% to 60%. - (grade 1 diastolic dysfunction). - Pulmonary arteries: PA peak pressure: 37 mm Hg (S) consistent with mild pulmonary hypertension  10/14 bilateral knees;Severe narrowing of all 3 compartments. Severe osteophyte formation    Antibiotics: Rocephin 10/13 >10/14 Vancomycin 10/14 > Cipro 10/15 only  HPI/Subjective: Knee pain improving but afraid to flex. Per techs, screams even when log rolling. Has not voided yet  Objective: Blood pressure 115/65, pulse 86, temperature 98.5 F (36.9 C), temperature source Oral, resp. rate 18, height 5\' 4"  (1.626 m), weight 92.57 kg (204 lb 1.3 oz), SpO2 98.00%.  Intake/Output Summary (Last 24 hours) at 02/01/14 0901 Last data filed at 02/01/14 0700  Gross per 24 hour  Intake    600 ml  Output    676 ml  Net    -76 ml   Exam: Gen: in bed. comfortable Chest: Clear to auscultation bilaterally, room air Cardiac: Regular rate and rhythm, S1-S2, no rubs murmurs or gallops Abdomen: Soft nontender nondistended obese Extremities: pedal edema Musculoskeletal: nontender. Resists ROM  Scheduled Meds:  Scheduled Meds: . antiseptic oral rinse  7 mL Mouth Rinse BID  . feeding supplement (GLUCERNA SHAKE)  237 mL Oral BID BM  . insulin aspart  0-15 Units Subcutaneous TID WC  . meloxicam  15 mg Oral Daily  . polyethylene glycol  17 g Oral Daily  . vancomycin  1,000 mg Intravenous Q8H    Data Reviewed: Basic Metabolic Panel:  Recent Labs Lab 01/27/14 1820 01/28/14 0123 01/29/14 0342  NA 136* 138 142  K 3.7 3.2* 3.9  CL 101 102 110  CO2  21 22 22   GLUCOSE 199* 187* 143*  BUN 22 32* 20  CREATININE 0.44* 0.57 0.38*  CALCIUM 8.8 8.5 8.5   Liver Function Tests:  Recent Labs Lab 01/27/14 2342 01/29/14 0342  AST 27 33  ALT 15 15  ALKPHOS 109 92  BILITOT 2.1* 1.2  PROT 6.8 6.2  ALBUMIN 1.7* 1.5*   CBC:  Recent  Labs Lab 01/27/14 1820 01/28/14 0123 01/29/14 0342 01/31/14 0538 02/01/14 0505  WBC 4.5 5.3 2.2* 2.5* 3.0*  NEUTROABS 3.9  --   --   --   --   HGB 11.4* 9.9* 9.9* 9.2* 9.6*  HCT 34.2* 30.1* 31.4* 28.6* 30.0*  MCV 88.1 86.5 87.7 85.9 86.5  PLT 105* 100* 84* 103* 124*   CBG:  Recent Labs Lab 01/30/14 2104 01/31/14 0707 01/31/14 1649 01/31/14 2153 02/01/14 0752  GLUCAP 174* 108* 183* 241* 174*    Recent Results (from the past 240 hour(s))  URINE CULTURE     Status: None   Collection Time    01/27/14  5:55 PM      Result Value Ref Range Status   Specimen Description URINE, CATHETERIZED   Final   Special Requests Normal   Final   Culture  Setup Time     Final   Value: 01/27/2014 22:18     Performed at 01/29/2014 Count     Final   Value: >=100,000 COLONIES/ML     Performed at [facility]   Culture     Final   Value: ESCHERICHIA COLI     Performed at [facility]   Report Status 01/30/2014 FINAL   Final   Organism ID, Bacteria ESCHERICHIA COLI   Final  CULTURE, BLOOD (ROUTINE X 2)     Status: None   Collection Time    01/27/14  6:20 PM      Result Value Ref Range Status   Specimen Description BLOOD LEFT ARM   Final   Special Requests BOTTLES DRAWN AEROBIC AND ANAEROBIC 10CC   Final   Culture  Setup Time     Final   Value: 01/27/2014 22:12     Performed at 01/29/2014   Culture     Final   Value: STAPHYLOCOCCUS AUREUS     Note: RIFAMPIN AND GENTAMICIN SHOULD NOT BE USED AS SINGLE DRUGS FOR TREATMENT OF STAPH INFECTIONS.     Note: Gram Stain Report Called to,Read Back By and Verified With: STEPHANIE SHAW 01/28/14 0855 BY [email or web address]     Performed at 01/30/14   Report Status 01/30/2014 FINAL   Final   Organism ID, Bacteria STAPHYLOCOCCUS AUREUS   Final  CULTURE, BLOOD (ROUTINE X 2)     Status: None   Collection Time    01/27/14  6:30 PM      Result Value Ref Range Status   Specimen Description BLOOD LEFT HAND    Final   Special Requests BOTTLES DRAWN AEROBIC AND ANAEROBIC 10CC   Final   Culture  Setup Time     Final   Value: 01/27/2014 22:11     Performed at 01/29/2014   Culture     Final   Value: STAPHYLOCOCCUS AUREUS     Note: SUSCEPTIBILITIES PERFORMED ON PREVIOUS CULTURE WITHIN THE LAST 5 DAYS.     Note: Gram Stain Report Called to,Read Back By and Verified With: STEPHANIE SHAW 01/28/14 0855 BY SMITHERSJ     Performed at 01/30/14  [facility]   Report Status 01/30/2014 FINAL   Final  MRSA PCR SCREENING     Status: None   Collection Time    01/27/14 11:18 PM      Result Value Ref Range Status   MRSA by PCR NEGATIVE  NEGATIVE Final   Comment:            The GeneXpert MRSA Assay (FDA     approved for NASAL specimens     only), is one component of a     comprehensive MRSA colonization     surveillance program. It is not     intended to diagnose MRSA     infection nor to guide or     monitor treatment for     MRSA infections.  CULTURE, BLOOD (ROUTINE X 2)     Status: None   Collection Time    01/30/14  5:00 AM      Result Value Ref Range Status   Specimen Description BLOOD LEFT ARM   Final   Special Requests BOTTLES DRAWN AEROBIC AND ANAEROBIC 5CC   Final   Culture  Setup Time     Final   Value: 01/30/2014 08:46     Performed at [facility]   Culture     Final   Value:        BLOOD CULTURE RECEIVED NO GROWTH TO DATE CULTURE WILL BE HELD FOR 5 DAYS BEFORE ISSUING A FINAL NEGATIVE REPORT     Performed at [facility]   Report Status PENDING   Incomplete  CULTURE, BLOOD (ROUTINE X 2)     Status: None   Collection Time    01/30/14  5:10 AM      Result Value Ref Range Status   Specimen Description BLOOD LEFT HAND   Final   Special Requests BOTTLES DRAWN AEROBIC AND ANAEROBIC 5CC   Final   Culture  Setup Time     Final   Value: 01/30/2014 08:47     Performed at [facility]   Culture     Final   Value:        BLOOD CULTURE RECEIVED NO GROWTH TO  DATE CULTURE WILL BE HELD FOR 5 DAYS BEFORE ISSUING A FINAL NEGATIVE REPORT     Performed at [facility]   Report Status PENDING   Incomplete     Studies:   Time spent :  25 mins  Crista Curb, MD [facility] [phone]  If 7PM-7AM, please contact night-coverage [email or web address] Password [email or web address] 02/01/2014, 9:01 AM   LOS: 5 days

## 2014-02-02 NOTE — Progress Notes (Signed)
[facility]  Date of Admission:  01/27/2014  Antibiotics: vancomycin  Subjective: Underwent TEE which was negative for vegetations  Objective: Temp:  [97.5 F (36.4 C)-100.3 F (37.9 C)] 98.7 F (37.1 C) (10/19 0819) Pulse Rate:  [96-114] 105 (10/19 1227) Resp:  [16-27] 21 (10/19 1227) BP: (104-156)/(43-80) 107/80 mmHg (10/19 1227) SpO2:  [91 %-99 %] 94 % (10/19 1227) Weight:  [206 lb 3.2 oz (93.532 kg)] 206 lb 3.2 oz (93.532 kg) (10/18 2134)  General: awake, alert pulm = ctab no w/c/r Cors = nl s1,s2, no s3, soft SEM 2/6 at apex Abd= soft, nt, nd Ext = no c/c/e   Lab Results Lab Results  Component Value Date   WBC 3.3* 02/02/2014   HGB 9.6* 02/02/2014   HCT 29.6* 02/02/2014   MCV 86.0 02/02/2014   PLT 160 02/02/2014    Lab Results  Component Value Date   CREATININE 0.37* 02/02/2014   BUN 13 02/02/2014   NA 140 02/02/2014   K 3.7 02/02/2014   CL 106 02/02/2014   CO2 25 02/02/2014    Lab Results  Component Value Date   ALT 15 01/29/2014   AST 33 01/29/2014   ALKPHOS 92 01/29/2014   BILITOT 1.2 01/29/2014      Microbiology: Recent Results (from the past 240 hour(s))  URINE CULTURE     Status: None   Collection Time    01/27/14  5:55 PM      Result Value Ref Range Status   Specimen Description URINE, CATHETERIZED   Final   Special Requests Normal   Final   Culture  Setup Time     Final   Value: 01/27/2014 22:18     Performed at [facility] Count     Final   Value: >=100,000 COLONIES/ML     Performed at [facility]   Culture     Final   Value: ESCHERICHIA COLI     Performed at [facility]   Report Status 01/30/2014 FINAL   Final   Organism ID, Bacteria ESCHERICHIA COLI   Final  CULTURE, BLOOD (ROUTINE X 2)     Status: None   Collection Time    01/27/14  6:20 PM      Result Value Ref Range Status   Specimen Description BLOOD LEFT ARM   Final   Special Requests BOTTLES DRAWN  AEROBIC AND ANAEROBIC 10CC   Final   Culture  Setup Time     Final   Value: 01/27/2014 22:12     Performed at [facility]   Culture     Final   Value: STAPHYLOCOCCUS AUREUS     Note: RIFAMPIN AND GENTAMICIN SHOULD NOT BE USED AS SINGLE DRUGS FOR TREATMENT OF STAPH INFECTIONS.     Note: Gram Stain Report Called to,Read Back By and Verified With: STEPHANIE SHAW 01/28/14 0855 BY SMITHERSJ     Performed at [facility]   Report Status 01/30/2014 FINAL   Final   Organism ID, Bacteria STAPHYLOCOCCUS AUREUS   Final  CULTURE, BLOOD (ROUTINE X 2)     Status: None   Collection Time    01/27/14  6:30 PM      Result Value Ref Range Status   Specimen Description BLOOD LEFT HAND   Final   Special Requests BOTTLES DRAWN AEROBIC AND ANAEROBIC 10CC   Final   Culture  Setup Time     Final   Value:  01/27/2014 22:11     Performed at [facility]     Final   Value: STAPHYLOCOCCUS AUREUS     Note: SUSCEPTIBILITIES PERFORMED ON PREVIOUS CULTURE WITHIN THE LAST 5 DAYS.     Note: Gram Stain Report Called to,Read Back By and Verified With: STEPHANIE SHAW 01/28/14 0855 BY SMITHERSJ     Performed at [facility]   Report Status 01/30/2014 FINAL   Final  MRSA PCR SCREENING     Status: None   Collection Time    01/27/14 11:18 PM      Result Value Ref Range Status   MRSA by PCR NEGATIVE  NEGATIVE Final   Comment:            The GeneXpert MRSA Assay (FDA     approved for NASAL specimens     only), is one component of a     comprehensive MRSA colonization     surveillance program. It is not     intended to diagnose MRSA     infection nor to guide or     monitor treatment for     MRSA infections.  CULTURE, BLOOD (ROUTINE X 2)     Status: None   Collection Time    01/30/14  5:00 AM      Result Value Ref Range Status   Specimen Description BLOOD LEFT ARM   Final   Special Requests BOTTLES DRAWN AEROBIC AND ANAEROBIC 5CC   Final   Culture  Setup Time     Final    Value: 01/30/2014 08:46     Performed at [facility]   Culture     Final   Value:        BLOOD CULTURE RECEIVED NO GROWTH TO DATE CULTURE WILL BE HELD FOR 5 DAYS BEFORE ISSUING A FINAL NEGATIVE REPORT     Performed at [facility]   Report Status PENDING   Incomplete  CULTURE, BLOOD (ROUTINE X 2)     Status: None   Collection Time    01/30/14  5:10 AM      Result Value Ref Range Status   Specimen Description BLOOD LEFT HAND   Final   Special Requests BOTTLES DRAWN AEROBIC AND ANAEROBIC 5CC   Final   Culture  Setup Time     Final   Value: 01/30/2014 08:47     Performed at [facility]   Culture     Final   Value:        BLOOD CULTURE RECEIVED NO GROWTH TO DATE CULTURE WILL BE HELD FOR 5 DAYS BEFORE ISSUING A FINAL NEGATIVE REPORT     Performed at [facility]   Report Status PENDING   Incomplete    Studies/Results: No results found.  Assessment/Plan: 1) MSSA bacteremia - no back pain, knees do not appear to be infected.  Penicillin allergy with hives and trouble breathing.  TEE is negative for endocarditis -continue with vancomycin and treat for 14 days using 10/16 as day ! - will place picc line order today   Robin Munson, MD [facility] [email or web address] [phone]    02/02/2014, 2:59 PM

## 2014-02-02 NOTE — Progress Notes (Signed)
  Echocardiogram Echocardiogram Transesophageal has been performed.  Robin Schroeder 02/02/2014, 12:25 PM

## 2014-02-02 NOTE — Progress Notes (Addendum)
PT Cancellation Note  Patient Details Name: Robin Schroeder MRN: [id number] DOB: 04/02/1956   Cancelled Treatment:    Reason Eval/Treat Not Completed: Patient at procedure or test/unavailable. Pt off unit at endo. Will check back as time allows.   Addendum: Checked back with pt at 4:05pm and pt was on the phone. Husband initially waving PT in and then asked that we return later. Will check back tomorrow.   Conni Slipper 02/02/2014, 12:08 PM  Conni Slipper, PT, DPT Acute Rehabilitation Services Pager: [phone]

## 2014-02-03 ENCOUNTER — Encounter (HOSPITAL_COMMUNITY): Payer: Self-pay | Admitting: Cardiology

## 2014-02-03 DIAGNOSIS — A499 Bacterial infection, unspecified: Secondary | ICD-10-CM

## 2014-02-03 LAB — C3 COMPLEMENT: C3 Complement: 135 mg/dL (ref 90–180)

## 2014-02-03 LAB — VITAMIN D 1,25 DIHYDROXY
VITAMIN D 1, 25 (OH) TOTAL: 41 pg/mL (ref 18–72)
VITAMIN D3 1, 25 (OH): 41 pg/mL
Vitamin D2 1, 25 (OH)2: <8 pg/mL

## 2014-02-03 LAB — GLUCOSE, CAPILLARY
GLUCOSE-CAPILLARY: 282 mg/dL — AB (ref 70–99)
Glucose-Capillary: 153 mg/dL — ABNORMAL HIGH (ref 70–99)
Glucose-Capillary: 204 mg/dL — ABNORMAL HIGH (ref 70–99)
Glucose-Capillary: 259 mg/dL — ABNORMAL HIGH (ref 70–99)

## 2014-02-03 LAB — C4 COMPLEMENT: COMPLEMENT C4, BODY FLUID: 11 mg/dL — AB (ref 10–40)

## 2014-02-03 LAB — ANTI-DNA ANTIBODY, DOUBLE-STRANDED: ds DNA Ab: 2 IU/mL

## 2014-02-03 LAB — ANTI-SMITH ANTIBODY: ENA SM AB SER-ACNC: NEGATIVE

## 2014-02-03 MED ORDER — SODIUM CHLORIDE 0.9 % IJ SOLN
10.0000 mL | INTRAMUSCULAR | Status: DC | PRN
Start: 2014-02-03 — End: 2014-02-04

## 2014-02-03 NOTE — Clinical Social Work Note (Signed)
CSW Intern note reviewed and approved as written. CSW and CSW Intern visited with patient and her husband Henleigh Robello and talked with them about efforts to find a facility that will accept patient. They are aware that the facility [place] of county and are fine with any facility that will accept her. Mr. Huron informed CSW that he has been in contact with one of the financial counselors regarding applying for assistance with patient's hospital bill.    CSW has also made contact with Dr. Jacky Kindle for assistance with patient placement at a skilled facility for 2 weeks of IV antibiotics.   Genelle Bal, MSW, LCSW [phone]

## 2014-02-03 NOTE — Progress Notes (Signed)
Occupational Therapy Treatment Patient Details Name: Robin Schroeder MRN: [id number] DOB: 1955/06/12 Today's Date: 02/03/2014    History of present illness Pt. was admitted 01/27/14 with fever, chills, weakness and significant functional decline over the last several month and thought related to knee and hip pain.  Pt. with PMH of DM. obesity, severe OA of both knees.   OT comments  Pt. Able to complete UB bathing with min a.  LB bathing max/dep. Limited by pain in B LEs. Transferring only by hoyer   At this point pt. Is unable to care for herself with out max caregiver A.  Will need continued therapy for increasing independence and at minimum decreasing caregiver burden with ADLS and mobility.  Husband present for session providing emotional support to pt. But not initiating or mentioning helping in any physical capacity for the pt.    Follow Up Recommendations  SNF;Supervision/Assistance - 24 hour                 Precautions / Restrictions Precautions Precautions: Fall;Other (comment) Precaution Comments: Pt. cries out with touch of either LE       Mobility Bed Mobility Overal bed mobility: Needs Assistance Bed Mobility: Rolling Rolling: Max assist         General bed mobility comments: max a to roll into sidelying relyed on pads to assist with initiation of rolling, pt. able to hold onto bed rail with both hands but requires SLOW max a to move top LE over the other LE   Transfers                 General transfer comment: hoyered to recliner with rn, and cna assistance                                       ADL Overall ADL's : Needs assistance/impaired     Grooming: Wash/dry hands;Wash/dry face;Set up;Bed level   Upper Body Bathing: Minimal assitance;Bed level Upper Body Bathing Details (indicate cue type and reason): cues for thoroughness Lower Body Bathing: Set up;Maximal assistance;Total assistance;Bed level Lower Body Bathing Details  (indicate cue type and reason): set up for front peri area supine, HOB elevated, max/dep. for back peri care supine rolling L/R, limited sidelying making this extremely difficult Upper Body Dressing : Minimal assistance;Bed level                   Functional mobility during ADLs: Maximal assistance General ADL Comments: agreeable to sponge bath, encouragement for participation with UB, max/dep. with LB secondary to screaming and yelling with any movement of BLES, sidelying very difficult                                                                                                    Pertinent Vitals/ Pain       Pain Assessment:  (Did not rate but screamed and yelled very loud with all movement) Pain Location: B LES Pain Intervention(s): Repositioned  Frequency Min 2X/week     Progress Toward Goals  OT Goals(current goals can now be found in the care plan section)  Progress towards OT goals: Progressing toward goals     Plan Discharge plan remains appropriate                     End of Session     Activity Tolerance Patient limited by pain   Patient Left in chair;with call bell/phone within reach;with chair alarm set;with family/visitor present;with nursing/sitter in room   Nurse Communication          Time: 1040-1130 OT Time Calculation (min): 50 min  Charges: OT General Charges $OT Visit: 1 Procedure OT Treatments $Self Care/Home Management : 38-52 mins  Robet Leu, COTA/L 02/03/2014, 11:41 AM

## 2014-02-03 NOTE — Clinical Social Work Note (Signed)
CSW Intern faxed out clinicals in [facility] for placement. Also faxed out clinicals to other local counties. [facility] of [place], [address] [place], [place] [facility] [place] , [place] [facility] [employer], [employer] and rehab of [place] and [place] [facility].  CSW Intern contacted Toniann Fail at [facility] admissions and the facility no longer accepts LOG's due to new ownership. Spoke with Carney Bern at [facility] admissions and they can not take the patient on LOG. Chris with [facility] will review patient clinicals and will follow up with CSW. [facility] denied patient as well. Attempted to call [facility] and was unable to establish contact due to voicemail is full, will follow up with admissions.    Arrie Aran CSW-Intern Clinical Social Work  [phone]

## 2014-02-03 NOTE — Progress Notes (Signed)
Unable to assess redness on buttocks, since patient refused to be turned.

## 2014-02-03 NOTE — Progress Notes (Signed)
Peripherally Inserted Central Catheter/Midline Placement  The IV Nurse has discussed with the patient and/or persons authorized to consent for the patient, the purpose of this procedure and the potential benefits and risks involved with this procedure.  The benefits include less needle sticks, lab draws from the catheter and patient may be discharged home with the catheter.  Risks include, but not limited to, infection, bleeding, blood clot (thrombus formation), and puncture of an artery; nerve damage and irregular heat beat.  Alternatives to this procedure were also discussed.  PICC/Midline Placement Documentation        Lisabeth Devoid 02/03/2014, 2:52 PM Consent obtained by Merleen Milliner, RN, CRNI

## 2014-02-03 NOTE — Progress Notes (Signed)
D/w Dr. Drue Second.   Progress Note  Robin Schroeder SEG:[id number] DOB: 1955-05-14 DOA: 01/27/2014 PCP: No PCP Per Patient  Brief narrative: 58 year old female who presented to the emergency department with complaints of generalized weakness. Patient has severe knee and hip pain and has experienced functional decline over the past few months with symptoms being worse in the past 4 days. She is also noticed fevers, chills, and  foul-smelling urine over this same time period.  Upon presentation to the ER she was tachycardic with a heart rate of 140, tachypneic with respiratory rates in the 30s. Her rectal temperature was 103 but she was normotensive.   Assessment/Plan:  Sepsis due to MSSA bacteremia. PCN allergic. Continue vancomycin.  Await picc. 2 weeks vancomycin starting 10/16. TEE neg  Escherichia coli in urine/acute urinary retention ID stopped cipro. Foley out.  Voiding ok  Diabetes mellitus, type 2 A1c in January was 7.4 and repeat this admission is 6.2 - CBGs well controlled - was not taking medications prior to admission - continue sliding scale insulin for now consider metformin at discharge  Physical deconditioning / end stage Osteoarthritis of both knees Less tender today after injection. Dry tap, so septic arthritis unlikely. S/p injection. Slightly improved after injection and lidoderm, but still largely bedbound. ANA pos (homogenous) and RF pos. Will check anti ds DNA, anti sm ab, complement. Start steroid pulse and f/u rheum as outpt. May need SNF- PT eval pending  Constipation: continue miralax  Morbid Obesity (BMI 30.0-34.9)  Incidental finding of diastolic dysfunction without heart failure No CHF. Weight up and pedal edema. Will give lasix x 3 days.   Hypokalemia Corrected  Vaginal yeast infection: s/p diflucan x1  Thrombocytopenia: resolved  Edema  Hypoalbuminemia  Code Status: Full Family Communication: no family at bedside Disposition Plan/Expected  LOS: discharge snf v. Home when arranged  Consultants: Dr. Staci Righter (ID) [facility] for TEE Ortho: [facility]   Procedures: 10/14 2-D echocardiogram: - Left ventricle:  Mild concentric hypertrophy. -LVEF= 55% to 60%. - (grade 1 diastolic dysfunction). - Pulmonary arteries: PA peak pressure: 37 mm Hg (S) consistent with mild pulmonary hypertension  10/14 bilateral knees;Severe narrowing of all 3 compartments. Severe osteophyte formation  Aspiration both knees, dry tap. injection  Antibiotics: Rocephin 10/13 >10/14 Vancomycin 10/14 > Cipro 10/15 only  HPI/Subjective: Awaiting bath Still with pain in knees PICC not placed yet  Objective: Blood pressure 136/66, pulse 69, temperature 97.8 F (36.6 C), temperature source Oral, resp. rate 16, height 5\' 4"  (1.626 m), weight 98.8 kg (217 lb 13 oz), SpO2 98.00%. No intake or output data in the 24 hours ending 02/03/14 02/05/14 Exam: Gen: in bed. comfortable Chest: Clear to auscultation bilaterally, room air Cardiac: Regular rate and rhythm, S1-S2, no rubs murmurs or gallops Abdomen: Soft nontender nondistended obese Extremities: pedal edema Musculoskeletal: nontender. Increased rom  Scheduled Meds:  Scheduled Meds: . antiseptic oral rinse  7 mL Mouth Rinse BID  . feeding supplement (GLUCERNA SHAKE)  237 mL Oral TID BM  . furosemide  40 mg Oral Daily  . heparin subcutaneous  5,000 Units Subcutaneous 3 times per day  . insulin aspart  0-15 Units Subcutaneous TID WC  . lidocaine  2 patch Transdermal Q24H  . meloxicam  15 mg Oral Daily  . polyethylene glycol  17 g Oral Daily  . predniSONE  60 mg Oral Q breakfast  . sodium chloride  3 mL Intravenous Q12H  . sodium chloride  3 mL Intravenous Q12H  .  vancomycin  1,500 mg Intravenous Q12H  . Vitamin D (Ergocalciferol)  50,000 Units Oral Q7 days    Data Reviewed: Basic Metabolic Panel:  Recent Labs Lab 01/27/14 1820 01/28/14 0123 01/29/14 0342 02/02/14 0550  NA 136* 138 142  140  K 3.7 3.2* 3.9 3.7  CL 101 102 110 106  CO2 21 22 22 25   GLUCOSE 199* 187* 143* 130*  BUN 22 32* 20 13  CREATININE 0.44* 0.57 0.38* 0.37*  CALCIUM 8.8 8.5 8.5 7.9*   Liver Function Tests:  Recent Labs Lab 01/27/14 2342 01/29/14 0342  AST 27 33  ALT 15 15  ALKPHOS 109 92  BILITOT 2.1* 1.2  PROT 6.8 6.2  ALBUMIN 1.7* 1.5*   CBC:  Recent Labs Lab 01/27/14 1820 01/28/14 0123 01/29/14 0342 01/31/14 0538 02/01/14 0505 02/02/14 0550  WBC 4.5 5.3 2.2* 2.5* 3.0* 3.3*  NEUTROABS 3.9  --   --   --   --   --   HGB 11.4* 9.9* 9.9* 9.2* 9.6* 9.6*  HCT 34.2* 30.1* 31.4* 28.6* 30.0* 29.6*  MCV 88.1 86.5 87.7 85.9 86.5 86.0  PLT 105* 100* 84* 103* 124* 160   CBG:  Recent Labs Lab 02/02/14 0750 02/02/14 1225 02/02/14 1738 02/02/14 2214 02/03/14 0752  GLUCAP 133* 122* 135* 181* 153*    Recent Results (from the past 240 hour(s))  URINE CULTURE     Status: None   Collection Time    01/27/14  5:55 PM      Result Value Ref Range Status   Specimen Description URINE, CATHETERIZED   Final   Special Requests Normal   Final   Culture  Setup Time     Final   Value: 01/27/2014 22:18     Performed at [facility] Count     Final   Value: >=100,000 COLONIES/ML     Performed at [facility]   Culture     Final   Value: ESCHERICHIA COLI     Performed at [facility]   Report Status 01/30/2014 FINAL   Final   Organism ID, Bacteria ESCHERICHIA COLI   Final  CULTURE, BLOOD (ROUTINE X 2)     Status: None   Collection Time    01/27/14  6:20 PM      Result Value Ref Range Status   Specimen Description BLOOD LEFT ARM   Final   Special Requests BOTTLES DRAWN AEROBIC AND ANAEROBIC 10CC   Final   Culture  Setup Time     Final   Value: 01/27/2014 22:12     Performed at [facility]   Culture     Final   Value: STAPHYLOCOCCUS AUREUS     Note: RIFAMPIN AND GENTAMICIN SHOULD NOT BE USED AS SINGLE DRUGS FOR TREATMENT OF STAPH INFECTIONS.      Note: Gram Stain Report Called to,Read Back By and Verified With: STEPHANIE SHAW 01/28/14 0855 BY SMITHERSJ     Performed at [facility]   Report Status 01/30/2014 FINAL   Final   Organism ID, Bacteria STAPHYLOCOCCUS AUREUS   Final  CULTURE, BLOOD (ROUTINE X 2)     Status: None   Collection Time    01/27/14  6:30 PM      Result Value Ref Range Status   Specimen Description BLOOD LEFT HAND   Final   Special Requests BOTTLES DRAWN AEROBIC AND ANAEROBIC 10CC   Final   Culture  Setup Time  Final   Value: 01/27/2014 22:11     Performed at [facility]   Culture     Final   Value: STAPHYLOCOCCUS AUREUS     Note: SUSCEPTIBILITIES PERFORMED ON PREVIOUS CULTURE WITHIN THE LAST 5 DAYS.     Note: Gram Stain Report Called to,Read Back By and Verified With: STEPHANIE SHAW 01/28/14 0855 BY SMITHERSJ     Performed at [facility]   Report Status 01/30/2014 FINAL   Final  MRSA PCR SCREENING     Status: None   Collection Time    01/27/14 11:18 PM      Result Value Ref Range Status   MRSA by PCR NEGATIVE  NEGATIVE Final   Comment:            The GeneXpert MRSA Assay (FDA     approved for NASAL specimens     only), is one component of a     comprehensive MRSA colonization     surveillance program. It is not     intended to diagnose MRSA     infection nor to guide or     monitor treatment for     MRSA infections.  CULTURE, BLOOD (ROUTINE X 2)     Status: None   Collection Time    01/30/14  5:00 AM      Result Value Ref Range Status   Specimen Description BLOOD LEFT ARM   Final   Special Requests BOTTLES DRAWN AEROBIC AND ANAEROBIC 5CC   Final   Culture  Setup Time     Final   Value: 01/30/2014 08:46     Performed at [facility]   Culture     Final   Value:        BLOOD CULTURE RECEIVED NO GROWTH TO DATE CULTURE WILL BE HELD FOR 5 DAYS BEFORE ISSUING A FINAL NEGATIVE REPORT     Performed at [facility]   Report Status PENDING   Incomplete   CULTURE, BLOOD (ROUTINE X 2)     Status: None   Collection Time    01/30/14  5:10 AM      Result Value Ref Range Status   Specimen Description BLOOD LEFT HAND   Final   Special Requests BOTTLES DRAWN AEROBIC AND ANAEROBIC 5CC   Final   Culture  Setup Time     Final   Value: 01/30/2014 08:47     Performed at [facility]   Culture     Final   Value:        BLOOD CULTURE RECEIVED NO GROWTH TO DATE CULTURE WILL BE HELD FOR 5 DAYS BEFORE ISSUING A FINAL NEGATIVE REPORT     Performed at [facility]   Report Status PENDING   Incomplete     Studies:   Time spent :  25 mins  Marlin Canary DO [facility] [phone]  If 7PM-7AM, please contact night-coverage [email or web address] Password [email or web address] 02/03/2014, 9:22 AM   LOS: 7 days

## 2014-02-04 LAB — VANCOMYCIN, TROUGH: VANCOMYCIN TR: 22.4 ug/mL — AB (ref 10.0–20.0)

## 2014-02-04 LAB — GLUCOSE, CAPILLARY
GLUCOSE-CAPILLARY: 151 mg/dL — AB (ref 70–99)
GLUCOSE-CAPILLARY: 94 mg/dL (ref 70–99)
Glucose-Capillary: 125 mg/dL — ABNORMAL HIGH (ref 70–99)

## 2014-02-04 MED ORDER — VANCOMYCIN HCL 10 G IV SOLR
1250.0000 mg | Freq: Two times a day (BID) | INTRAVENOUS | Status: DC
  Filled 2014-02-04: qty 1250

## 2014-02-04 MED ORDER — POLYETHYLENE GLYCOL 3350 17 G PO PACK
17.0000 g | PACK | Freq: Every day | ORAL | Status: DC | PRN
  Filled 2014-02-04: qty 1

## 2014-02-04 MED ORDER — SODIUM CHLORIDE 0.9 % IV SOLN: 1500.0000 mg | Freq: Two times a day (BID) | INTRAVENOUS | Status: DC

## 2014-02-04 MED ORDER — VANCOMYCIN HCL 10 G IV SOLR: 1250.0000 mg | Freq: Two times a day (BID) | INTRAVENOUS | Status: AC

## 2014-02-04 MED ORDER — OXYCODONE HCL 5 MG PO TABS: 5.0000 mg | ORAL_TABLET | ORAL | Status: DC | PRN

## 2014-02-04 MED ORDER — FUROSEMIDE 40 MG PO TABS: 40.0000 mg | ORAL_TABLET | Freq: Every day | ORAL | Status: DC

## 2014-02-04 MED ORDER — POLYETHYLENE GLYCOL 3350 17 G PO PACK: 17.0000 g | PACK | Freq: Every day | ORAL | Status: AC | PRN

## 2014-02-04 MED ORDER — HEPARIN SOD (PORK) LOCK FLUSH 100 UNIT/ML IV SOLN
250.0000 [IU] | INTRAVENOUS | Status: AC | PRN
Start: 2014-02-04 — End: 2014-02-04
  Administered 2014-02-04: 250 [IU]

## 2014-02-04 NOTE — Progress Notes (Signed)
Chaplain made initial visit with Mrs. Regan Rakers. When I walked in her sister was bedside and teary-eyed. Mrs. Wojnar was very glad to see a Chaplain and immediately began crying when she invited me in. She began expressing that her family was falling apart, how much strain she is placing on her sister, in addition to her anxiety about being out of work for over one year. Her mother dies 3 years ago and it appears that she is yet grieving. Has solid support from her sister Ander Slade who has been visiting consistently. Has a brother and 2 other sisters, and expressed that she doesn't get much support from her brothers and one of her sisters. Mrs. Paul is managing a lot of anxiety from her diabetes management and family stressors that are very acute to her emotional well-being. Chaplain listened empathically, facilitated story telling and anxiety containment. Patient strongly believes "everything happens for a reason" and interprets her health and life events in a theological hue. Chaplain explored re-framing options. Pt feels like shes been judged harshly as of late. Will continue to follow.   Gala Romney, Chaplain 02/04/2014 [phone]

## 2014-02-04 NOTE — Discharge Summary (Addendum)
Physician Discharge Summary  Robin Schroeder PJK:[id number] DOB: October 08, 1955 DOA: 01/27/2014  PCP: No PCP Per Patient  Admit date: 01/27/2014 Discharge date: 02/04/2014  Time spent: 45 minutes  Recommendations for Outpatient Follow-up:  1. PCP in 1 week 2. FU with Ortho in 2-3 weeks for Management of End stage Osteoarthritis 3. Stop IV Vancomycin on 10/30, DC PICC line After Abx course completed 4. Check Bmet and Vanc trough in 1 week  Discharge Diagnoses:    MSSA Bacteremia   Sepsis   UTI (lower urinary tract infection)   DM2 (diabetes mellitus, type 2)   Staphylococcus aureus bacteremia with sepsis   Chronic diastolic heart failure, NYHA class 1   Physical deconditioning   Severe Osteoarthritis of both knees   Hypokalemia   HTN (hypertension)   Constipation   Acute urinary retention   Thrombocytopenia   Hypoalbuminemia   Edema   Obesity   Discharge Condition: stable  Diet recommendation: Low sodium  Filed Weights   02/01/14 2134 02/02/14 2105 02/03/14 2134  Weight: 93.532 kg (206 lb 3.2 oz) 98.8 kg (217 lb 13 oz) 94.5 kg (208 lb 5.4 oz)    History of present illness:  58 year old female who presented to the emergency department with complaints of generalized weakness. Patient has severe knee and hip pain and has experienced functional decline over the past few months with symptoms being worse in the past 4 days. She is also noticed fevers, chills.   Hospital Course:  Sepsis due to MSSA bacteremia.  -PCN allergic -Started on IV Vancomycin, per ID Dr.Snider needs 2 weeks of IV Vanc, start date 10/16 -S/p arthrocentesis, dry tap, knees dont appear to be infected -PICC placed,  -underwent TEE which was negative for endocarditis  Escherichia coli in urine/acute urinary retention  -ID stopped cipro. Foley out. Voiding ok   Diabetes mellitus, type 2  -Hbaic 7.4 in Jan and repeat this admission is 6.2  - CBGs well controlled   End stage Osteoarthritis of both  knees  -Dry tap, on arthrocentesis so septic arthritis unlikely.  -Seen by Dr.Handy and his PA this admission, S/p lidocaine injection.  -FU with Ortho down the road will benefit from intra-articular steroids and eventually Knee replacement  Constipation: continue miralax PRN  Morbid Obesity (BMI 30.0-34.9)   Incidental finding of diastolic dysfunction without heart failure  No CHF. Weight up and pedal edema. -resumed PO lasix -monitor Bmet in 1 week  Hypokalemia  Corrected   Vaginal yeast infection: s/p diflucan x1   Thrombocytopenia: resolved     Procedures:  Arthrocentesis  TEE: no endicarditis  Consultations:  Ortho Dr.Handy  Discharge Exam: Filed Vitals:   02/04/14 0900  BP: 136/65  Pulse: 72  Temp: 98.6 F (37 C)  Resp: 21    General: AAOx3 Cardiovascular: S1S2/RRR Respiratory: CTAB  Discharge Instructions You were cared for by a hospitalist during your hospital stay. If you have any questions about your discharge medications or the care you received while you were in the hospital after you are discharged, you can call the unit and asked to speak with the hospitalist on call if the hospitalist that took care of you is not available. Once you are discharged, your primary care physician will handle any further medical issues. Please note that NO REFILLS for any discharge medications will be authorized once you are discharged, as it is imperative that you return to your primary care physician (or establish a relationship with a primary care physician if you do not  have one) for your aftercare needs so that they can reassess your need for medications and monitor your lab values.  Discharge Instructions   Diet - low sodium heart healthy    Complete by:  As directed      Increase activity slowly    Complete by:  As directed           Current Discharge Medication List    START taking these medications   Details  furosemide (LASIX) 40 MG tablet Take 1  tablet (40 mg total) by mouth daily. Qty: 30 tablet, Refills: 0    oxyCODONE (OXY IR/ROXICODONE) 5 MG immediate release tablet Take 1 tablet (5 mg total) by mouth every 4 (four) hours as needed for moderate pain. Qty: 20 tablet, Refills: 0    polyethylene glycol (MIRALAX / GLYCOLAX) packet Take 17 g by mouth daily as needed for moderate constipation. Qty: 14 each, Refills: 0    vancomycin 1,500 mg in sodium chloride 0.9 % 500 mL Inject 1,250 mg into the vein every 12 (twelve) hours. Till 10/30      STOP taking these medications     naproxen sodium (ANAPROX) 220 MG tablet      Throat Lozenges (COUGH DROPS MT)      meloxicam (MOBIC) 15 MG tablet        Allergies  Allergen Reactions  . Penicillins Rash   Follow-up Information   Follow up with PCP. Schedule an appointment as soon as possible for a visit in 1 week.       The results of significant diagnostics from this hospitalization (including imaging, microbiology, ancillary and laboratory) are listed below for reference.    Significant Diagnostic Studies: Dg Knee 1-2 Views Left  23-Feb-2014   CLINICAL DATA:  Initial evaluation for bilateral knee pain, history of fall to left knee  EXAM: LEFT KNEE - 1-2 VIEW  COMPARISON:  None.  FINDINGS: Severe narrowing of all 3 compartments. Lateral view is significantly suboptimal due to non standard positioning. Severe osteophyte formation of the patellofemoral compartment. Mild osteophyte formation in the medial and lateral compartments. No fracture dislocation or joint effusion.  IMPRESSION: Severe arthritis   Electronically Signed   By: Esperanza Heir M.D.   On: 2014-02-23 10:19   Dg Knee 1-2 Views Right  Feb 23, 2014   CLINICAL DATA:  Initial evaluation for bilateral knee pain, no known injury, personal history of diabetes and arthritis  EXAM: RIGHT KNEE - 1-2 VIEW  COMPARISON:  None.  FINDINGS: Severe narrowing of all 3 compartments. Prominent patellofemoral osteophyte formation. No  fracture, dislocation or effusion.  IMPRESSION: Severe arthritis   Electronically Signed   By: Esperanza Heir M.D.   On: Feb 23, 2014 10:18   Dg Tibia/fibula Left  01/30/2014   CLINICAL DATA:  Pain and swelling in both legs secondary to a fall. Severe arthritis. Bacteremia. Mechanical block noted on range of motion exam.  EXAM: LEFT TIBIA AND FIBULA - 2 VIEW  COMPARISON:  Knee radiographs dated 02/23/2014  FINDINGS: There is moderate arthritis at the left ankle joint with joint space narrowing and osteophyte formation on the medial malleolus. No evidence of joint effusion. Calcification in the distal Achilles tendon. Posterior and plantar calcaneal spurs. Osteoarthritis of the knee joint.  IMPRESSION: Moderately severe osteoarthritis of the knee and ankle. No ankylosis. No acute osseous abnormalities.   Electronically Signed   By: Geanie Cooley M.D.   On: 01/30/2014 17:09   Dg Tibia/fibula Right  01/30/2014   CLINICAL DATA:  Severe arthritis admitted for bacteremia. Question septic joint versus ankylosis. No definite effusion on exam. Recent fall.  EXAM: RIGHT TIBIA AND FIBULA - 2 VIEW  COMPARISON:  01/28/2014 right knee  FINDINGS: Diffuse decreased bone mineralization is present. Moderate osteoarthritis is present over the knee. There are degenerative changes over the ankle. There is spurring over the posterior and inferior calcaneus. No definite bone destruction.  IMPRESSION: No acute findings.   Electronically Signed   By: Elberta Fortis M.D.   On: 01/30/2014 17:12   US Renal  01/27/2014   CLINICAL DATA:  UTI, fever, history of hypertension and diabetes. Initial encounter.  EXAM: RENAL/URINARY TRACT ULTRASOUND COMPLETE  COMPARISON:  None.  FINDINGS: Right Kidney:  Normal cortical thickness, echogenicity and size, measuring 10.0 cm in length. No focal renal lesions. No echogenic renal stones. No urinary obstruction.  Left Kidney:  Normal cortical thickness, echogenicity and size, measuring 10.6 cm in  length. No focal renal lesions. No echogenic renal stones. No urinary obstruction.  Bladder:  Appears normal for degree of bladder distention.  The spleen is incidentally noted to me enlarged measuring 15.1 x 14.5 x 6.8 cm (calculated volume of 787.9 cm3).  IMPRESSION: 1. No evidence urinary obstruction. 2. Nonspecific splenomegaly, measuring 15.1 cm in maximal diameter. Clinical correlation is advised.   Electronically Signed   By: Simonne Come M.D.   On: 01/27/2014 22:02   Dg Chest Port 1 View  01/27/2014   CLINICAL DATA:  Confusion, shortness of breath.  Initial encounter.  EXAM: PORTABLE CHEST - 1 VIEW  COMPARISON:  None.  FINDINGS: Examination is degraded due to patient rotation and portable technique.  Borderline enlarged cardiac silhouette and mediastinal contours, possibly accentuated due to decreased lung volumes and patient rotation to the left. Mild pulmonary venous congestion without frank evidence of edema. Minimal bilateral infrahilar heterogeneous opacities, right greater than left. Trace left-sided effusion is not excluded. No pneumothorax. No acute osseus abnormalities. Degenerative change of the right AC joint is suspected though incompletely evaluated.  IMPRESSION: Pulmonary venous congestion and bilateral infrahilar atelectasis suspected on this rotated AP portable examination. Further evaluation with a PA and lateral chest radiograph may be obtained as clinically indicated.   Electronically Signed   By: Simonne Come M.D.   On: 01/27/2014 18:47   Dg Knee Complete 4 Views Left  01/30/2014   CLINICAL DATA:  Patient was severe arthritis admitted for bacteremia. Evaluate for septic joint. No definite effusion on clinical exam. Evaluate for ankylosis. Recent fall.  EXAM: LEFT KNEE - COMPLETE 4+ VIEW  COMPARISON:  01/28/2014  FINDINGS: Moderate decreased bone mineralization is present. There is moderate tricompartmental osteoarthritis. Possible small joint effusion. No definite fracture or  dislocation. No definite bone destruction.  IMPRESSION: Moderate tricompartmental osteoarthritis and possible small joint effusion. No definite osteolysis or ankylosis.   Electronically Signed   By: Elberta Fortis M.D.   On: 01/30/2014 17:08   Dg Knee Complete 4 Views Right  01/30/2014   CLINICAL DATA:  Severe arthritis.  Admitted with bacteremia.  EXAM: RIGHT KNEE - COMPLETE 4+ VIEW  COMPARISON:  None.  FINDINGS: Diffuse soft tissue swelling noted. The bones are diffusely osteopenic. There is a small suprapatellar joint effusion. Moderate tricompartment joint space narrowing and marginal spur formation is identified.  IMPRESSION: 1. Moderate to severe tricompartment osteoarthritis. 2. Joint effusion. 3. Osteopenia.   Electronically Signed   By: Signa Kell M.D.   On: 01/30/2014 17:08    Microbiology: Recent Results (from the  past 240 hour(s))  URINE CULTURE     Status: None   Collection Time    01/27/14  5:55 PM      Result Value Ref Range Status   Specimen Description URINE, CATHETERIZED   Final   Special Requests Normal   Final   Culture  Setup Time     Final   Value: 01/27/2014 22:18     Performed at [facility] Count     Final   Value: >=100,000 COLONIES/ML     Performed at [facility]   Culture     Final   Value: ESCHERICHIA COLI     Performed at [facility]   Report Status 01/30/2014 FINAL   Final   Organism ID, Bacteria ESCHERICHIA COLI   Final  CULTURE, BLOOD (ROUTINE X 2)     Status: None   Collection Time    01/27/14  6:20 PM      Result Value Ref Range Status   Specimen Description BLOOD LEFT ARM   Final   Special Requests BOTTLES DRAWN AEROBIC AND ANAEROBIC 10CC   Final   Culture  Setup Time     Final   Value: 01/27/2014 22:12     Performed at [facility]   Culture     Final   Value: STAPHYLOCOCCUS AUREUS     Note: RIFAMPIN AND GENTAMICIN SHOULD NOT BE USED AS SINGLE DRUGS FOR TREATMENT OF STAPH INFECTIONS.     Note:  Gram Stain Report Called to,Read Back By and Verified With: STEPHANIE SHAW 01/28/14 0855 BY [email or web address]     Performed at [facility]   Report Status 01/30/2014 FINAL   Final   Organism ID, Bacteria STAPHYLOCOCCUS AUREUS   Final  CULTURE, BLOOD (ROUTINE X 2)     Status: None   Collection Time    01/27/14  6:30 PM      Result Value Ref Range Status   Specimen Description BLOOD LEFT HAND   Final   Special Requests BOTTLES DRAWN AEROBIC AND ANAEROBIC 10CC   Final   Culture  Setup Time     Final   Value: 01/27/2014 22:11     Performed at [facility]   Culture     Final   Value: STAPHYLOCOCCUS AUREUS     Note: SUSCEPTIBILITIES PERFORMED ON PREVIOUS CULTURE WITHIN THE LAST 5 DAYS.     Note: Gram Stain Report Called to,Read Back By and Verified With: STEPHANIE SHAW 01/28/14 0855 BY SMITHERSJ     Performed at [facility]   Report Status 01/30/2014 FINAL   Final  MRSA PCR SCREENING     Status: None   Collection Time    01/27/14 11:18 PM      Result Value Ref Range Status   MRSA by PCR NEGATIVE  NEGATIVE Final   Comment:            The GeneXpert MRSA Assay (FDA     approved for NASAL specimens     only), is one component of a     comprehensive MRSA colonization     surveillance program. It is not     intended to diagnose MRSA     infection nor to guide or     monitor treatment for     MRSA infections.  CULTURE, BLOOD (ROUTINE X 2)     Status: None   Collection Time    01/30/14  5:00 AM  Result Value Ref Range Status   Specimen Description BLOOD LEFT ARM   Final   Special Requests BOTTLES DRAWN AEROBIC AND ANAEROBIC 5CC   Final   Culture  Setup Time     Final   Value: 01/30/2014 08:46     Performed at [facility]   Culture     Final   Value:        BLOOD CULTURE RECEIVED NO GROWTH TO DATE CULTURE WILL BE HELD FOR 5 DAYS BEFORE ISSUING A FINAL NEGATIVE REPORT     Performed at [facility]   Report Status PENDING   Incomplete  CULTURE,  BLOOD (ROUTINE X 2)     Status: None   Collection Time    01/30/14  5:10 AM      Result Value Ref Range Status   Specimen Description BLOOD LEFT HAND   Final   Special Requests BOTTLES DRAWN AEROBIC AND ANAEROBIC 5CC   Final   Culture  Setup Time     Final   Value: 01/30/2014 08:47     Performed at [facility]   Culture     Final   Value:        BLOOD CULTURE RECEIVED NO GROWTH TO DATE CULTURE WILL BE HELD FOR 5 DAYS BEFORE ISSUING A FINAL NEGATIVE REPORT     Performed at [facility]   Report Status PENDING   Incomplete     Labs: Basic Metabolic Panel:  Recent Labs Lab 01/29/14 0342 02/02/14 0550  NA 142 140  K 3.9 3.7  CL 110 106  CO2 22 25  GLUCOSE 143* 130*  BUN 20 13  CREATININE 0.38* 0.37*  CALCIUM 8.5 7.9*   Liver Function Tests:  Recent Labs Lab 01/29/14 0342  AST 33  ALT 15  ALKPHOS 92  BILITOT 1.2  PROT 6.2  ALBUMIN 1.5*   No results found for this basename: LIPASE, AMYLASE,  in the last 168 hours No results found for this basename: AMMONIA,  in the last 168 hours CBC:  Recent Labs Lab 01/29/14 0342 01/31/14 0538 02/01/14 0505 02/02/14 0550  WBC 2.2* 2.5* 3.0* 3.3*  HGB 9.9* 9.2* 9.6* 9.6*  HCT 31.4* 28.6* 30.0* 29.6*  MCV 87.7 85.9 86.5 86.0  PLT 84* 103* 124* 160   Cardiac Enzymes: No results found for this basename: CKTOTAL, CKMB, CKMBINDEX, TROPONINI,  in the last 168 hours BNP: BNP (last 3 results) No results found for this basename: PROBNP,  in the last 8760 hours CBG:  Recent Labs Lab 02/03/14 1159 02/03/14 1700 02/03/14 2130 02/04/14 0817 02/04/14 1136  GLUCAP 204* 259* 282* 151* 94       Signed:  Jiro Kiester  [facility] 02/04/2014, 12:42 PM

## 2014-02-04 NOTE — Progress Notes (Signed)
Physical Therapy Treatment Patient Details Name: Robin Schroeder MRN: [id number] DOB: March 09, 1956 Today's Date: 02/04/2014    History of Present Illness Pt. was admitted 01/27/14 with fever, chills, weakness and significant functional decline over the last several month and thought related to knee and hip pain.  Pt. with PMH of DM. obesity, severe OA of both knees.    PT Comments    Pt with extremely limited progress d/t limited participation d/t pain; pt attempts to refuse upon PTs arrival today; with encouragement, participated minimally; pt educated on the importance of assisting other staff in turning her and on pressure relief/risks of skin breakdown; will continue to follow; continues to be appropriate for SNF  Follow Up Recommendations  SNF;Supervision/Assistance - 24 hour     Equipment Recommendations  Other (comment)    Recommendations for Other Services       Precautions / Restrictions Precautions Precautions: Fall;Other (comment) Precaution Comments: Pt. cries out with touch of either LE    Mobility  Bed Mobility Overal bed mobility: Needs Assistance Bed Mobility: Rolling Rolling: Mod assist;Min assist         General bed mobility comments: graded/repeated rolling to pt left side, multi-modal cues for technique--right knee flexion, use of UEs/rail; bed pad used to bring hips into full s/l; pt declines rolling in opposite direction  Transfers                    Ambulation/Gait                 Stairs            Wheelchair Mobility    Modified Rankin (Stroke Patients Only)       Balance                                    Cognition Arousal/Alertness: Awake/alert Behavior During Therapy: Flat affect Overall Cognitive Status: No family/caregiver present to determine baseline cognitive functioning                      Exercises General Exercises - Lower Extremity Ankle Circles/Pumps: AROM;Right;10  reps;Limitations Ankle Circles/Pumps Limitations: requires max cues; unable to  move L ankle Heel Slides: AAROM;Both;10 reps;Limitations Heel Slides Limitations: used maxi slides; pt cries in pain with all movement, limited all joints, more flexion on R than L Other Exercises Other Exercises: attempts to extend bil knees x 5 Other Exercises: attempts to bring bil hips to neutral x 7    General Comments        Pertinent Vitals/Pain Pain Assessment:  (did not rate but screams and cries with any movement or touc) Pain Location: Bil LEs Pain Intervention(s): Monitored during session;Repositioned    Home Living                      Prior Function            PT Goals (current goals can now be found in the care plan section) Acute Rehab PT Goals 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: "to be able to get up and walk" PT Goal Formulation: With patient/family Time For Goal Achievement: 02/13/14 Potential to Achieve Goals: Fair Progress towards PT goals: Not progressing toward goals - comment (d/t pain)    Frequency  Min 2X/week    PT Plan Current plan remains appropriate    Co-evaluation  End of Session Equipment Utilized During Treatment: Other (comment) (maxi slide) Activity Tolerance: Patient limited by pain Patient left: in bed;with call bell/phone within reach     Time: 6503-5465 PT Time Calculation (min): 17 min  Charges:  $Therapeutic Exercise: 8-22 mins                    G Codes:      Etana Beets 02-13-14, 11:39 AM

## 2014-02-04 NOTE — Progress Notes (Signed)
Patient Discharge: Disposition: Patient discharged to [facility].   Education: Patient educated on the medications, prescriptions, discharge instructions, and plan of care. IV:  Patient discharging with PICC line. Telemetry: Not applicable. Transportation: Patient is transported via ambulance with 2 EMS staff accompanying. Belongings: Patient took all her belongings with her.

## 2014-02-04 NOTE — Progress Notes (Signed)
ANTIBIOTIC CONSULT NOTE - FOLLOW UP  Pharmacy Consult for vancomycin Indication: MSSA bacteremia  Allergies  Allergen Reactions  . Penicillins Rash    Patient Measurements: Height: 5\' 4"  (162.6 cm) Weight: 208 lb 5.4 oz (94.5 kg) IBW/kg (Calculated) : 54.7   Vital Signs: Temp: 98.6 F (37 C) (10/21 0900) Temp Source: Oral (10/21 0900) BP: 136/65 mmHg (10/21 0900) Pulse Rate: 72 (10/21 0900) Intake/Output from previous day: 10/20 0701 - 10/21 0700 In: 2719.3 [P.O.:840; I.V.:879.3; IV Piggyback:1000] Out: 3 [Urine:2; Stool:1] Intake/Output from this shift: Total I/O In: 240 [P.O.:240] Out: 1 [Urine:1]  Labs:  Recent Labs  02/02/14 0550  WBC 3.3*  HGB 9.6*  PLT 160  CREATININE 0.37*   Estimated Creatinine Clearance: 84.4 ml/min (by C-G formula based on Cr of 0.37).  Recent Labs  02/02/14 0550 02/04/14 1246  VANCOTROUGH 19.1 22.4*     Microbiology: Recent Results (from the past 720 hour(s))  URINE CULTURE     Status: None   Collection Time    01/27/14  5:55 PM      Result Value Ref Range Status   Specimen Description URINE, CATHETERIZED   Final   Special Requests Normal   Final   Culture  Setup Time     Final   Value: 01/27/2014 22:18     Performed at 01/29/2014 Count     Final   Value: >=100,000 COLONIES/ML     Performed at [facility]   Culture     Final   Value: ESCHERICHIA COLI     Performed at [facility]   Report Status 01/30/2014 FINAL   Final   Organism ID, Bacteria ESCHERICHIA COLI   Final  CULTURE, BLOOD (ROUTINE X 2)     Status: None   Collection Time    01/27/14  6:20 PM      Result Value Ref Range Status   Specimen Description BLOOD LEFT ARM   Final   Special Requests BOTTLES DRAWN AEROBIC AND ANAEROBIC 10CC   Final   Culture  Setup Time     Final   Value: 01/27/2014 22:12     Performed at 01/29/2014   Culture     Final   Value: STAPHYLOCOCCUS AUREUS     Note: RIFAMPIN AND  GENTAMICIN SHOULD NOT BE USED AS SINGLE DRUGS FOR TREATMENT OF STAPH INFECTIONS.     Note: Gram Stain Report Called to,Read Back By and Verified With: STEPHANIE SHAW 01/28/14 0855 BY [email or web address]     Performed at 01/30/14   Report Status 01/30/2014 FINAL   Final   Organism ID, Bacteria STAPHYLOCOCCUS AUREUS   Final  CULTURE, BLOOD (ROUTINE X 2)     Status: None   Collection Time    01/27/14  6:30 PM      Result Value Ref Range Status   Specimen Description BLOOD LEFT HAND   Final   Special Requests BOTTLES DRAWN AEROBIC AND ANAEROBIC 10CC   Final   Culture  Setup Time     Final   Value: 01/27/2014 22:11     Performed at 01/29/2014   Culture     Final   Value: STAPHYLOCOCCUS AUREUS     Note: SUSCEPTIBILITIES PERFORMED ON PREVIOUS CULTURE WITHIN THE LAST 5 DAYS.     Note: Gram Stain Report Called to,Read Back By and Verified With: STEPHANIE SHAW 01/28/14 0855 BY [email or web address]     Performed at 01/30/14  Report Status 01/30/2014 FINAL   Final  MRSA PCR SCREENING     Status: None   Collection Time    01/27/14 11:18 PM      Result Value Ref Range Status   MRSA by PCR NEGATIVE  NEGATIVE Final   Comment:            The GeneXpert MRSA Assay (FDA     approved for NASAL specimens     only), is one component of a     comprehensive MRSA colonization     surveillance program. It is not     intended to diagnose MRSA     infection nor to guide or     monitor treatment for     MRSA infections.  CULTURE, BLOOD (ROUTINE X 2)     Status: None   Collection Time    01/30/14  5:00 AM      Result Value Ref Range Status   Specimen Description BLOOD LEFT ARM   Final   Special Requests BOTTLES DRAWN AEROBIC AND ANAEROBIC 5CC   Final   Culture  Setup Time     Final   Value: 01/30/2014 08:46     Performed at [facility]   Culture     Final   Value:        BLOOD CULTURE RECEIVED NO GROWTH TO DATE CULTURE WILL BE HELD FOR 5 DAYS BEFORE ISSUING A FINAL NEGATIVE  REPORT     Performed at [facility]   Report Status PENDING   Incomplete  CULTURE, BLOOD (ROUTINE X 2)     Status: None   Collection Time    01/30/14  5:10 AM      Result Value Ref Range Status   Specimen Description BLOOD LEFT HAND   Final   Special Requests BOTTLES DRAWN AEROBIC AND ANAEROBIC 5CC   Final   Culture  Setup Time     Final   Value: 01/30/2014 08:47     Performed at [facility]   Culture     Final   Value:        BLOOD CULTURE RECEIVED NO GROWTH TO DATE CULTURE WILL BE HELD FOR 5 DAYS BEFORE ISSUING A FINAL NEGATIVE REPORT     Performed at [facility]   Report Status PENDING   Incomplete    Anti-infectives   Start     Dose/Rate Route Frequency Ordered Stop   02/04/14 0000  vancomycin 1,500 mg in sodium chloride 0.9 % 500 mL     1,500 mg 250 mL/hr over 120 Minutes Intravenous Every 12 hours 02/04/14 1241 02/13/14 2359   02/02/14 1400  vancomycin (VANCOCIN) 1,500 mg in sodium chloride 0.9 % 500 mL IVPB     1,500 mg 250 mL/hr over 120 Minutes Intravenous Every 12 hours 02/02/14 1014     02/01/14 0600  vancomycin (VANCOCIN) IVPB 1000 mg/200 mL premix  Status:  Discontinued     1,000 mg 200 mL/hr over 60 Minutes Intravenous Every 8 hours 01/31/14 2221 02/02/14 1014   01/30/14 1300  fluconazole (DIFLUCAN) tablet 150 mg     150 mg Oral  Once 01/30/14 1122 01/30/14 2348   01/29/14 0800  ciprofloxacin (CIPRO) IVPB 400 mg  Status:  Discontinued     400 mg 200 mL/hr over 60 Minutes Intravenous Every 12 hours 01/29/14 0752 01/29/14 1603   01/28/14 1845  cefTRIAXone (ROCEPHIN) 1 g in dextrose 5 % 50 mL IVPB  Status:  Discontinued  1 g 100 mL/hr over 30 Minutes Intravenous Every 24 hours 01/27/14 2026 01/28/14 1352   01/28/14 1430  vancomycin (VANCOCIN) IVPB 1000 mg/200 mL premix  Status:  Discontinued     1,000 mg 200 mL/hr over 60 Minutes Intravenous Every 12 hours 01/28/14 1352 01/31/14 2221   01/27/14 1845  cefTRIAXone (ROCEPHIN) 1 g in  dextrose 5 % 50 mL IVPB     1 g 100 mL/hr over 30 Minutes Intravenous  Once 01/27/14 1834 01/27/14 1910      Assessment: Patient is a 58 y.o F on vancomycin for MSSA bacteremia with plan to treat for 2 weeks total (end date 10/30).  TEE negative for vegetation.  Vancomycin trough level now back slightly supratherapeutic at 22.4 today with current regimen of 1500mg  IV q12h.     Goal of Therapy:  Vancomycin trough level 15-20 mcg/ml  Plan:  1) change vancomycin to 1250mg  IV q12h - recommend this dose for discharge 2) recommend checking weekly vancomycin level until abx course has been completed  Jasmen Emrich P 02/04/2014,2:05 PM

## 2014-02-04 NOTE — Clinical Social Work Placement (Signed)
Clinical Social Work Department CLINICAL SOCIAL WORK PLACEMENT NOTE 02/04/2014  Patient:  Robin Schroeder, Robin Schroeder  Account Number:  [id number] Admit date:  01/27/2014  Clinical Social Worker:  Genelle Bal, LCSW  Date/time:  02/04/2014 03:05 AM  Clinical Social Work is seeking post-discharge placement for this patient at the following level of care:   SKILLED NURSING   (*CSW will update this form in Epic as items are completed)     Patient/family provided with [facility] Department of Clinical Social Work's list of facilities offering this level of care within the geographic area requested by the patient (or if unable, by the patient's family).  01/29/2014  Patient/family informed of their freedom to choose among providers that offer the needed level of care, that participate in Medicare, Medicaid or managed care program needed by the patient, have an available bed and are willing to accept the patient.    Patient/family informed of [facility]' ownership interest in [facility], as well as of the fact that they are under no obligation to receive care at this facility.  PASARR submitted to EDS on 01/28/2014 PASARR number received on 01/28/2014 - [phone] [id number]  FL2 transmitted to all facilities in geographic area requested by pt/family on  02/03/2014 FL2 transmitted to all facilities within larger geographic area on   Patient informed that his/her managed care company has contracts with or will negotiate with  certain facilities, including the following:     Patient/family informed of bed offers received:  02/03/2014 Patient chooses bed at [facility] Physician recommends and patient chooses bed at    Patient to be transferred to [facility] on  02/04/2014 Patient to be transferred to facility by  Patient and family notified of transfer on 02/04/2014 Name of family member notified:  Husband Tayleigh Wetherell  The following  physician request were entered in Epic:   Additional Comments:

## 2014-02-05 LAB — CULTURE, BLOOD (ROUTINE X 2)
CULTURE: NO GROWTH
Culture: NO GROWTH

## 2014-02-12 ENCOUNTER — Encounter: Payer: Self-pay | Admitting: Internal Medicine

## 2014-02-12 ENCOUNTER — Ambulatory Visit (INDEPENDENT_AMBULATORY_CARE_PROVIDER_SITE_OTHER): Payer: Medicaid Other | Admitting: Internal Medicine

## 2014-02-12 VITALS — BP 141/78 | HR 103 | Temp 97.0°F

## 2014-02-12 DIAGNOSIS — R7881 Bacteremia: Secondary | ICD-10-CM

## 2014-02-12 LAB — CBC WITH DIFFERENTIAL/PLATELET
Basophils Absolute: 0 10*3/uL (ref 0.0–0.1)
Basophils Relative: 1 % (ref 0–1)
EOS ABS: 0 10*3/uL (ref 0.0–0.7)
EOS PCT: 1 % (ref 0–5)
HCT: 32.8 % — ABNORMAL LOW (ref 36.0–46.0)
Hemoglobin: 10.5 g/dL — ABNORMAL LOW (ref 12.0–15.0)
Lymphocytes Relative: 17 % (ref 12–46)
Lymphs Abs: 0.6 10*3/uL — ABNORMAL LOW (ref 0.7–4.0)
MCH: 27.9 pg (ref 26.0–34.0)
MCHC: 32 g/dL (ref 30.0–36.0)
MCV: 87 fL (ref 78.0–100.0)
Monocytes Absolute: 0.3 10*3/uL (ref 0.1–1.0)
Monocytes Relative: 8 % (ref 3–12)
NEUTROS PCT: 73 % (ref 43–77)
Neutro Abs: 2.6 10*3/uL (ref 1.7–7.7)
PLATELETS: 174 10*3/uL (ref 150–400)
RBC: 3.77 MIL/uL — AB (ref 3.87–5.11)
RDW: 16.6 % — ABNORMAL HIGH (ref 11.5–15.5)
WBC: 3.6 10*3/uL — ABNORMAL LOW (ref 4.0–10.5)

## 2014-02-12 LAB — COMPLETE METABOLIC PANEL WITH GFR
ALK PHOS: 206 U/L — AB (ref 39–117)
ALT: 16 U/L (ref 0–35)
AST: 27 U/L (ref 0–37)
Albumin: 2.4 g/dL — ABNORMAL LOW (ref 3.5–5.2)
BILIRUBIN TOTAL: 1 mg/dL (ref 0.2–1.2)
BUN: 7 mg/dL (ref 6–23)
CO2: 26 mEq/L (ref 19–32)
Calcium: 8.1 mg/dL — ABNORMAL LOW (ref 8.4–10.5)
Chloride: 99 mEq/L (ref 96–112)
Creat: 0.44 mg/dL — ABNORMAL LOW (ref 0.50–1.10)
GLUCOSE: 182 mg/dL — AB (ref 70–99)
Potassium: 3.3 mEq/L — ABNORMAL LOW (ref 3.5–5.3)
SODIUM: 136 meq/L (ref 135–145)
Total Protein: 6.7 g/dL (ref 6.0–8.3)

## 2014-02-12 LAB — C-REACTIVE PROTEIN: CRP: 7.4 mg/dL — ABNORMAL HIGH (ref <0.60)

## 2014-02-13 LAB — SEDIMENTATION RATE: Sed Rate: 124 mm/hr — ABNORMAL HIGH (ref 0–22)

## 2014-02-16 NOTE — Progress Notes (Signed)
Subjective:    Patient ID: Robin Schroeder, female    DOB: May 11, 1955, 58 y.o.   MRN: [id number]  HPI Robin Schroeder is a 58yo F who was recently admitted for MSSA bacteremia with sepsis. She was found to have MSSA bacteremia on 10/13, blood cx cleared on 10/16, placed on cefazolin 2gm Q8hr. Underwent TEE on 10/19 , negative for vegetations. She was to finish 14 day course using 10/16 as day 1. She also was found to be severely deconditioned with severe OA. She underwent rheumatology work up that was POSITIVE FOR RA, which she had not sought care for before. Sed rate >100, ana 1:160, CCPA > 300, RF>600. She was unable to have rheumatology consultation during hospitalizaiton. She was discharged to SNF for IV antibiotics and rehab. She states that she has incredible knee pain bilaterally that makes it difficult for her to participate in PT. No pain at picc line site.  Current Outpatient Prescriptions on File Prior to Visit  Medication Sig Dispense Refill  . furosemide (LASIX) 40 MG tablet Take 1 tablet (40 mg total) by mouth daily. 30 tablet 0  . oxyCODONE (OXY IR/ROXICODONE) 5 MG immediate release tablet Take 1 tablet (5 mg total) by mouth every 4 (four) hours as needed for moderate pain. 20 tablet 0  . polyethylene glycol (MIRALAX / GLYCOLAX) packet Take 17 g by mouth daily as needed for moderate constipation. 14 each 0   No current facility-administered medications on file prior to visit.   Active Ambulatory Problems    Diagnosis Date Noted  . Obesity (BMI 30.0-34.9) 05/09/2013  . Sepsis 01/27/2014  . UTI (lower urinary tract infection) 01/27/2014  . DM2 (diabetes mellitus, type 2) 01/27/2014  . Staphylococcus aureus bacteremia with sepsis 01/28/2014  . Chronic diastolic heart failure, NYHA class 1 01/28/2014  . Physical deconditioning 01/28/2014  . Severe Osteoarthritis of both knees 01/28/2014  . Hypokalemia 01/28/2014  . HTN (hypertension) 01/28/2014  . Constipation 01/30/2014  . Acute  urinary retention 01/30/2014  . Thrombocytopenia 01/30/2014  . Staphylococcus aureus bacteremia 02/02/2014  . Hypoalbuminemia 02/02/2014  . Edema 02/02/2014   Resolved Ambulatory Problems    Diagnosis Date Noted  . Type II or unspecified type diabetes mellitus without mention of complication, not stated as uncontrolled 05/09/2013   Past Medical History  Diagnosis Date  . Hypertension   . Diabetes mellitus without complication   . Arthritis       Review of Systems Joint pain including knees, shoulders    Objective:   Physical Exam BP 141/78 mmHg  Pulse 103  Temp(Src) 97 F (36.1 C) Physical Exam  Constitutional:  oriented to person, place, and time. appears well-developed but disheveled, chronically ill No distress.  HENT:  Mouth/Throat: Oropharynx is clear and moist. No oropharyngeal exudate. Poor dentition Cardiovascular: Normal rate, regular rhythm and normal heart sounds. Exam reveals no gallop and no friction rub.  No murmur heard.  Pulmonary/Chest: Effort normal and breath sounds normal. No respiratory distress.  has no wheezes.  Abdominal: Soft. Bowel sounds are normal.  exhibits no distension. There is no tenderness.  Lymphadenopathy: no cervical adenopathy.  Neurological: alert and oriented to person, place, and time.  Skin: Skin is warm and dry. No rash noted. No erythema.  Ext: non pitting edema. Mild warmth no erythema to knees bilaterally Psychiatric: simple demeaner     Assessment & Plan:  MSSA bacteremia = she is to finish her IV cefazolin on 10/30. Can pull picc line thereafter. Please repeat blood  culture peripherally x 2 in the first week of November to ensure documented clearance and no recurrence  Rheumatoid arthritis = I spoke to [employer] as informal consult on what to do presently until can establish PCP for ms. Robin Schroeder. Can start on prednisone 5mg  daily x 1 month. Hope to have her be seen by PCP in 2 wks so that can start other regimens such as  methotrexate.  Health literacy = both her and her husband have very simple baseline understanding of her health and navigating medical system. Will get them in touch with social worker to help with application for medicaid  Patient is currently uninsured per her husband, asking them to meet with financial counselor to get access to care in order to see rheumatologist and not have excessive out of pocket costs.

## 2014-02-23 ENCOUNTER — Ambulatory Visit: Payer: Self-pay | Admitting: Internal Medicine

## 2014-03-19 ENCOUNTER — Ambulatory Visit: Payer: Self-pay | Admitting: Internal Medicine

## 2014-03-19 ENCOUNTER — Emergency Department (HOSPITAL_COMMUNITY): Payer: Medicaid Other

## 2014-03-19 ENCOUNTER — Encounter (HOSPITAL_COMMUNITY): Payer: Self-pay | Admitting: Emergency Medicine

## 2014-03-19 ENCOUNTER — Inpatient Hospital Stay (HOSPITAL_COMMUNITY)
Admission: EM | Admit: 2014-03-19 | Discharge: 2014-04-06 | DRG: 871 | Disposition: A | Discharge status: Hospice | Payer: Medicaid Other | Attending: Internal Medicine | Admitting: Internal Medicine

## 2014-03-19 DIAGNOSIS — D500 Iron deficiency anemia secondary to blood loss (chronic): Secondary | ICD-10-CM | POA: Diagnosis present

## 2014-03-19 DIAGNOSIS — E669 Obesity, unspecified: Secondary | ICD-10-CM | POA: Diagnosis present

## 2014-03-19 DIAGNOSIS — Z66 Do not resuscitate: Secondary | ICD-10-CM | POA: Diagnosis present

## 2014-03-19 DIAGNOSIS — E1165 Type 2 diabetes mellitus with hyperglycemia: Secondary | ICD-10-CM | POA: Diagnosis present

## 2014-03-19 DIAGNOSIS — J810 Acute pulmonary edema: Secondary | ICD-10-CM

## 2014-03-19 DIAGNOSIS — E8809 Other disorders of plasma-protein metabolism, not elsewhere classified: Secondary | ICD-10-CM | POA: Diagnosis present

## 2014-03-19 DIAGNOSIS — M2540 Effusion, unspecified joint: Secondary | ICD-10-CM

## 2014-03-19 DIAGNOSIS — J90 Pleural effusion, not elsewhere classified: Secondary | ICD-10-CM | POA: Diagnosis present

## 2014-03-19 DIAGNOSIS — Z8249 Family history of ischemic heart disease and other diseases of the circulatory system: Secondary | ICD-10-CM | POA: Diagnosis not present

## 2014-03-19 DIAGNOSIS — E43 Unspecified severe protein-calorie malnutrition: Secondary | ICD-10-CM | POA: Insufficient documentation

## 2014-03-19 DIAGNOSIS — E785 Hyperlipidemia, unspecified: Secondary | ICD-10-CM | POA: Diagnosis present

## 2014-03-19 DIAGNOSIS — M0500 Felty's syndrome, unspecified site: Secondary | ICD-10-CM | POA: Diagnosis present

## 2014-03-19 DIAGNOSIS — F329 Major depressive disorder, single episode, unspecified: Secondary | ICD-10-CM | POA: Diagnosis present

## 2014-03-19 DIAGNOSIS — R52 Pain, unspecified: Secondary | ICD-10-CM

## 2014-03-19 DIAGNOSIS — R7881 Bacteremia: Secondary | ICD-10-CM

## 2014-03-19 DIAGNOSIS — M869 Osteomyelitis, unspecified: Secondary | ICD-10-CM

## 2014-03-19 DIAGNOSIS — R112 Nausea with vomiting, unspecified: Secondary | ICD-10-CM

## 2014-03-19 DIAGNOSIS — K229 Disease of esophagus, unspecified: Secondary | ICD-10-CM

## 2014-03-19 DIAGNOSIS — L89159 Pressure ulcer of sacral region, unspecified stage: Secondary | ICD-10-CM

## 2014-03-19 DIAGNOSIS — R630 Anorexia: Secondary | ICD-10-CM | POA: Insufficient documentation

## 2014-03-19 DIAGNOSIS — R410 Disorientation, unspecified: Secondary | ICD-10-CM | POA: Diagnosis not present

## 2014-03-19 DIAGNOSIS — E11621 Type 2 diabetes mellitus with foot ulcer: Secondary | ICD-10-CM | POA: Diagnosis present

## 2014-03-19 DIAGNOSIS — R6521 Severe sepsis with septic shock: Secondary | ICD-10-CM | POA: Diagnosis present

## 2014-03-19 DIAGNOSIS — M86159 Other acute osteomyelitis, unspecified femur: Secondary | ICD-10-CM | POA: Insufficient documentation

## 2014-03-19 DIAGNOSIS — E860 Dehydration: Secondary | ICD-10-CM | POA: Diagnosis present

## 2014-03-19 DIAGNOSIS — E119 Type 2 diabetes mellitus without complications: Secondary | ICD-10-CM

## 2014-03-19 DIAGNOSIS — M86152 Other acute osteomyelitis, left femur: Secondary | ICD-10-CM | POA: Diagnosis present

## 2014-03-19 DIAGNOSIS — E2740 Unspecified adrenocortical insufficiency: Secondary | ICD-10-CM | POA: Diagnosis present

## 2014-03-19 DIAGNOSIS — E878 Other disorders of electrolyte and fluid balance, not elsewhere classified: Secondary | ICD-10-CM | POA: Diagnosis present

## 2014-03-19 DIAGNOSIS — R05 Cough: Secondary | ICD-10-CM | POA: Diagnosis present

## 2014-03-19 DIAGNOSIS — I5032 Chronic diastolic (congestive) heart failure: Secondary | ICD-10-CM | POA: Diagnosis present

## 2014-03-19 DIAGNOSIS — E08621 Diabetes mellitus due to underlying condition with foot ulcer: Secondary | ICD-10-CM | POA: Diagnosis present

## 2014-03-19 DIAGNOSIS — R0600 Dyspnea, unspecified: Secondary | ICD-10-CM | POA: Diagnosis present

## 2014-03-19 DIAGNOSIS — B9620 Unspecified Escherichia coli [E. coli] as the cause of diseases classified elsewhere: Secondary | ICD-10-CM | POA: Diagnosis present

## 2014-03-19 DIAGNOSIS — Z515 Encounter for palliative care: Secondary | ICD-10-CM | POA: Diagnosis not present

## 2014-03-19 DIAGNOSIS — E872 Acidosis, unspecified: Secondary | ICD-10-CM | POA: Insufficient documentation

## 2014-03-19 DIAGNOSIS — L97509 Non-pressure chronic ulcer of other part of unspecified foot with unspecified severity: Secondary | ICD-10-CM

## 2014-03-19 DIAGNOSIS — R633 Feeding difficulties, unspecified: Secondary | ICD-10-CM

## 2014-03-19 DIAGNOSIS — R601 Generalized edema: Secondary | ICD-10-CM | POA: Diagnosis present

## 2014-03-19 DIAGNOSIS — L97429 Non-pressure chronic ulcer of left heel and midfoot with unspecified severity: Secondary | ICD-10-CM | POA: Diagnosis present

## 2014-03-19 DIAGNOSIS — Z7952 Long term (current) use of systemic steroids: Secondary | ICD-10-CM | POA: Diagnosis not present

## 2014-03-19 DIAGNOSIS — A419 Sepsis, unspecified organism: Secondary | ICD-10-CM

## 2014-03-19 DIAGNOSIS — R634 Abnormal weight loss: Secondary | ICD-10-CM

## 2014-03-19 DIAGNOSIS — I2119 ST elevation (STEMI) myocardial infarction involving other coronary artery of inferior wall: Secondary | ICD-10-CM | POA: Diagnosis not present

## 2014-03-19 DIAGNOSIS — L97529 Non-pressure chronic ulcer of other part of left foot with unspecified severity: Secondary | ICD-10-CM | POA: Diagnosis present

## 2014-03-19 DIAGNOSIS — L89150 Pressure ulcer of sacral region, unstageable: Secondary | ICD-10-CM | POA: Diagnosis present

## 2014-03-19 DIAGNOSIS — Z7401 Bed confinement status: Secondary | ICD-10-CM | POA: Diagnosis not present

## 2014-03-19 DIAGNOSIS — R778 Other specified abnormalities of plasma proteins: Secondary | ICD-10-CM | POA: Diagnosis not present

## 2014-03-19 DIAGNOSIS — M069 Rheumatoid arthritis, unspecified: Secondary | ICD-10-CM | POA: Diagnosis present

## 2014-03-19 DIAGNOSIS — R627 Adult failure to thrive: Secondary | ICD-10-CM | POA: Diagnosis present

## 2014-03-19 DIAGNOSIS — A4101 Sepsis due to Methicillin susceptible Staphylococcus aureus: Principal | ICD-10-CM | POA: Diagnosis present

## 2014-03-19 DIAGNOSIS — I2510 Atherosclerotic heart disease of native coronary artery without angina pectoris: Secondary | ICD-10-CM | POA: Diagnosis present

## 2014-03-19 DIAGNOSIS — R45851 Suicidal ideations: Secondary | ICD-10-CM | POA: Diagnosis present

## 2014-03-19 DIAGNOSIS — N939 Abnormal uterine and vaginal bleeding, unspecified: Secondary | ICD-10-CM

## 2014-03-19 DIAGNOSIS — R5381 Other malaise: Secondary | ICD-10-CM

## 2014-03-19 DIAGNOSIS — M1990 Unspecified osteoarthritis, unspecified site: Secondary | ICD-10-CM | POA: Diagnosis present

## 2014-03-19 DIAGNOSIS — Z6836 Body mass index (BMI) 36.0-36.9, adult: Secondary | ICD-10-CM

## 2014-03-19 DIAGNOSIS — N179 Acute kidney failure, unspecified: Secondary | ICD-10-CM | POA: Diagnosis not present

## 2014-03-19 DIAGNOSIS — Z79899 Other long term (current) drug therapy: Secondary | ICD-10-CM | POA: Diagnosis not present

## 2014-03-19 DIAGNOSIS — R6520 Severe sepsis without septic shock: Secondary | ICD-10-CM

## 2014-03-19 DIAGNOSIS — R040 Epistaxis: Secondary | ICD-10-CM | POA: Diagnosis not present

## 2014-03-19 DIAGNOSIS — J811 Chronic pulmonary edema: Secondary | ICD-10-CM

## 2014-03-19 DIAGNOSIS — E871 Hypo-osmolality and hyponatremia: Secondary | ICD-10-CM | POA: Diagnosis present

## 2014-03-19 DIAGNOSIS — R9431 Abnormal electrocardiogram [ECG] [EKG]: Secondary | ICD-10-CM

## 2014-03-19 DIAGNOSIS — I10 Essential (primary) hypertension: Secondary | ICD-10-CM | POA: Diagnosis present

## 2014-03-19 DIAGNOSIS — J9811 Atelectasis: Secondary | ICD-10-CM | POA: Diagnosis not present

## 2014-03-19 DIAGNOSIS — R454 Irritability and anger: Secondary | ICD-10-CM | POA: Diagnosis not present

## 2014-03-19 DIAGNOSIS — M009 Pyogenic arthritis, unspecified: Secondary | ICD-10-CM | POA: Insufficient documentation

## 2014-03-19 DIAGNOSIS — E46 Unspecified protein-calorie malnutrition: Secondary | ICD-10-CM

## 2014-03-19 DIAGNOSIS — D62 Acute posthemorrhagic anemia: Secondary | ICD-10-CM | POA: Diagnosis not present

## 2014-03-19 DIAGNOSIS — K567 Ileus, unspecified: Secondary | ICD-10-CM

## 2014-03-19 DIAGNOSIS — N938 Other specified abnormal uterine and vaginal bleeding: Secondary | ICD-10-CM | POA: Diagnosis present

## 2014-03-19 DIAGNOSIS — N390 Urinary tract infection, site not specified: Secondary | ICD-10-CM | POA: Diagnosis present

## 2014-03-19 DIAGNOSIS — M25569 Pain in unspecified knee: Secondary | ICD-10-CM

## 2014-03-19 DIAGNOSIS — R161 Splenomegaly, not elsewhere classified: Secondary | ICD-10-CM | POA: Diagnosis present

## 2014-03-19 DIAGNOSIS — G8929 Other chronic pain: Secondary | ICD-10-CM | POA: Diagnosis present

## 2014-03-19 DIAGNOSIS — R12 Heartburn: Secondary | ICD-10-CM | POA: Diagnosis not present

## 2014-03-19 DIAGNOSIS — M13862 Other specified arthritis, left knee: Secondary | ICD-10-CM | POA: Diagnosis present

## 2014-03-19 DIAGNOSIS — Z880 Allergy status to penicillin: Secondary | ICD-10-CM

## 2014-03-19 DIAGNOSIS — D696 Thrombocytopenia, unspecified: Secondary | ICD-10-CM | POA: Diagnosis present

## 2014-03-19 DIAGNOSIS — E876 Hypokalemia: Secondary | ICD-10-CM | POA: Diagnosis not present

## 2014-03-19 DIAGNOSIS — I213 ST elevation (STEMI) myocardial infarction of unspecified site: Secondary | ICD-10-CM | POA: Diagnosis present

## 2014-03-19 DIAGNOSIS — M25562 Pain in left knee: Secondary | ICD-10-CM

## 2014-03-19 DIAGNOSIS — Z872 Personal history of diseases of the skin and subcutaneous tissue: Secondary | ICD-10-CM

## 2014-03-19 DIAGNOSIS — L97409 Non-pressure chronic ulcer of unspecified heel and midfoot with unspecified severity: Secondary | ICD-10-CM

## 2014-03-19 DIAGNOSIS — M25561 Pain in right knee: Secondary | ICD-10-CM

## 2014-03-19 DIAGNOSIS — R7989 Other specified abnormal findings of blood chemistry: Secondary | ICD-10-CM | POA: Diagnosis not present

## 2014-03-19 HISTORY — DX: Atherosclerotic heart disease of native coronary artery without angina pectoris: I25.10

## 2014-03-19 LAB — CBC WITH DIFFERENTIAL/PLATELET
BASOS ABS: 0 10*3/uL (ref 0.0–0.1)
Basophils Relative: 0 % (ref 0–1)
EOS ABS: 0 10*3/uL (ref 0.0–0.7)
Eosinophils Relative: 0 % (ref 0–5)
HCT: 24.7 % — ABNORMAL LOW (ref 36.0–46.0)
Hemoglobin: 7.9 g/dL — ABNORMAL LOW (ref 12.0–15.0)
LYMPHS PCT: 8 % — AB (ref 12–46)
Lymphs Abs: 0.3 10*3/uL — ABNORMAL LOW (ref 0.7–4.0)
MCH: 27 pg (ref 26.0–34.0)
MCHC: 32 g/dL (ref 30.0–36.0)
MCV: 84.3 fL (ref 78.0–100.0)
Monocytes Absolute: 0.1 10*3/uL (ref 0.1–1.0)
Monocytes Relative: 3 % (ref 3–12)
Neutro Abs: 3.8 10*3/uL (ref 1.7–7.7)
Neutrophils Relative %: 89 % — ABNORMAL HIGH (ref 43–77)
PLATELETS: 77 10*3/uL — AB (ref 150–400)
RBC: 2.93 MIL/uL — ABNORMAL LOW (ref 3.87–5.11)
RDW: 17 % — AB (ref 11.5–15.5)
WBC: 4.2 10*3/uL (ref 4.0–10.5)

## 2014-03-19 LAB — COMPREHENSIVE METABOLIC PANEL
ALBUMIN: 1.2 g/dL — AB (ref 3.5–5.2)
ALT: 12 U/L (ref 0–35)
ANION GAP: 11 (ref 5–15)
AST: 46 U/L — AB (ref 0–37)
Alkaline Phosphatase: 219 U/L — ABNORMAL HIGH (ref 39–117)
BILIRUBIN TOTAL: 1.6 mg/dL — AB (ref 0.3–1.2)
BUN: 24 mg/dL — ABNORMAL HIGH (ref 6–23)
CHLORIDE: 82 meq/L — AB (ref 96–112)
CO2: 28 mEq/L (ref 19–32)
CREATININE: 0.37 mg/dL — AB (ref 0.50–1.10)
Calcium: 7.8 mg/dL — ABNORMAL LOW (ref 8.4–10.5)
GFR calc Af Amer: >90 mL/min (ref >90)
GFR calc non Af Amer: >90 mL/min (ref >90)
Glucose, Bld: 221 mg/dL — ABNORMAL HIGH (ref 70–99)
Potassium: 3.8 mEq/L (ref 3.7–5.3)
Sodium: 121 mEq/L — CL (ref 137–147)
TOTAL PROTEIN: 6.6 g/dL (ref 6.0–8.3)

## 2014-03-19 LAB — URINALYSIS, ROUTINE W REFLEX MICROSCOPIC
Glucose, UA: NEGATIVE mg/dL
Ketones, ur: NEGATIVE mg/dL
NITRITE: NEGATIVE
PH: 5 (ref 5.0–8.0)
Protein, ur: NEGATIVE mg/dL
SPECIFIC GRAVITY, URINE: 1.021 (ref 1.005–1.030)
UROBILINOGEN UA: 2 mg/dL — AB (ref 0.0–1.0)

## 2014-03-19 LAB — CBC
HCT: 22 % — ABNORMAL LOW (ref 36.0–46.0)
Hemoglobin: 7.1 g/dL — ABNORMAL LOW (ref 12.0–15.0)
MCH: 27.6 pg (ref 26.0–34.0)
MCHC: 32.3 g/dL (ref 30.0–36.0)
MCV: 85.6 fL (ref 78.0–100.0)
Platelets: 52 10*3/uL — ABNORMAL LOW (ref 150–400)
RBC: 2.57 MIL/uL — ABNORMAL LOW (ref 3.87–5.11)
RDW: 16.8 % — AB (ref 11.5–15.5)
WBC: 4.3 10*3/uL (ref 4.0–10.5)

## 2014-03-19 LAB — PROTIME-INR
INR: 1.29 (ref 0.00–1.49)
PROTHROMBIN TIME: 16.2 s — AB (ref 11.6–15.2)

## 2014-03-19 LAB — URINE MICROSCOPIC-ADD ON

## 2014-03-19 LAB — BLOOD GAS, ARTERIAL
Acid-Base Excess: 6.5 mmol/L — ABNORMAL HIGH (ref 0.0–2.0)
Bicarbonate: 29.4 mEq/L — ABNORMAL HIGH (ref 20.0–24.0)
Drawn by: 257701
FIO2: 0.21 %
O2 Saturation: 94 %
PCO2 ART: 40.8 mmHg (ref 35.0–45.0)
Patient temperature: 103.5
TCO2: 27.1 mmol/L (ref 0–100)
pH, Arterial: 7.484 — ABNORMAL HIGH (ref 7.350–7.450)
pO2, Arterial: 78.7 mmHg — ABNORMAL LOW (ref 80.0–100.0)

## 2014-03-19 LAB — ABO/RH: ABO/RH(D): A POS

## 2014-03-19 LAB — I-STAT CG4 LACTIC ACID, ED
LACTIC ACID, VENOUS: 2.53 mmol/L — AB (ref 0.5–2.2)
LACTIC ACID, VENOUS: 1.85 mmol/L (ref 0.5–2.2)
Lactic Acid, Venous: 2.52 mmol/L — ABNORMAL HIGH (ref 0.5–2.2)

## 2014-03-19 LAB — I-STAT TROPONIN, ED: TROPONIN I, POC: 0.16 ng/mL — AB (ref 0.00–0.08)

## 2014-03-19 LAB — CREATININE, SERUM
CREATININE: 0.32 mg/dL — AB (ref 0.50–1.10)
GFR calc Af Amer: >90 mL/min (ref >90)

## 2014-03-19 LAB — TROPONIN I: TROPONIN I: 0.82 ng/mL — AB (ref <0.30)

## 2014-03-19 MED ORDER — ACETAMINOPHEN 325 MG PO TABS
650.0000 mg | ORAL_TABLET | Freq: Four times a day (QID) | ORAL | Status: DC | PRN
  Administered 2014-03-19: 325 mg via ORAL
  Filled 2014-03-19: qty 2

## 2014-03-19 MED ORDER — SODIUM CHLORIDE 0.9 % IJ SOLN
3.0000 mL | INTRAMUSCULAR | Status: DC | PRN
Start: 2014-03-19 — End: 2014-03-24

## 2014-03-19 MED ORDER — ONDANSETRON HCL 4 MG PO TABS
4.0000 mg | ORAL_TABLET | Freq: Four times a day (QID) | ORAL | Status: DC | PRN
  Administered 2014-03-21: 4 mg via ORAL
  Filled 2014-03-19: qty 1

## 2014-03-19 MED ORDER — ACETAMINOPHEN 650 MG RE SUPP: 650.0000 mg | Freq: Four times a day (QID) | RECTAL | Status: DC | PRN

## 2014-03-19 MED ORDER — SODIUM CHLORIDE 0.9 % IV SOLN
INTRAVENOUS | Status: DC
  Administered 2014-03-19 (×2): via INTRAVENOUS

## 2014-03-19 MED ORDER — VANCOMYCIN HCL 10 G IV SOLR
1250.0000 mg | Freq: Two times a day (BID) | INTRAVENOUS | Status: DC
  Administered 2014-03-19 – 2014-03-22 (×5): 1250 mg via INTRAVENOUS
  Filled 2014-03-19 (×7): qty 1250

## 2014-03-19 MED ORDER — DEXTROSE 5 % IV SOLN
2.0000 g | Freq: Three times a day (TID) | INTRAVENOUS | Status: DC
  Administered 2014-03-19 – 2014-03-20 (×2): 2 g via INTRAVENOUS
  Filled 2014-03-19 (×3): qty 2

## 2014-03-19 MED ORDER — ONDANSETRON HCL 4 MG/2ML IJ SOLN
4.0000 mg | Freq: Once | INTRAMUSCULAR | Status: AC
  Administered 2014-03-19: 4 mg via INTRAVENOUS
  Filled 2014-03-19: qty 2

## 2014-03-19 MED ORDER — AZTREONAM 2 G IJ SOLR
2.0000 g | Freq: Once | INTRAMUSCULAR | Status: AC
  Administered 2014-03-19: 2 g via INTRAVENOUS
  Filled 2014-03-19: qty 2

## 2014-03-19 MED ORDER — ENOXAPARIN SODIUM 40 MG/0.4ML ~~LOC~~ SOLN
40.0000 mg | SUBCUTANEOUS | Status: DC
  Administered 2014-03-19: 40 mg via SUBCUTANEOUS
  Filled 2014-03-19: qty 0.4

## 2014-03-19 MED ORDER — ACETAMINOPHEN 325 MG PO TABS
650.0000 mg | ORAL_TABLET | Freq: Four times a day (QID) | ORAL | Status: DC | PRN
  Administered 2014-03-22: 650 mg via ORAL
  Filled 2014-03-19 (×2): qty 2

## 2014-03-19 MED ORDER — SODIUM CHLORIDE 0.9 % IV BOLUS (SEPSIS)
1000.0000 mL | INTRAVENOUS | Status: AC
  Administered 2014-03-19 (×3): 1000 mL via INTRAVENOUS

## 2014-03-19 MED ORDER — SODIUM CHLORIDE 0.9 % IV SOLN: 250.0000 mL | INTRAVENOUS | Status: DC | PRN

## 2014-03-19 MED ORDER — SODIUM CHLORIDE 0.9 % IV BOLUS (SEPSIS)
1000.0000 mL | Freq: Once | INTRAVENOUS | Status: AC
  Administered 2014-03-19: 1000 mL via INTRAVENOUS

## 2014-03-19 MED ORDER — ONDANSETRON HCL 4 MG/2ML IJ SOLN
4.0000 mg | Freq: Four times a day (QID) | INTRAMUSCULAR | Status: DC | PRN
  Administered 2014-03-26 – 2014-03-29 (×6): 4 mg via INTRAVENOUS
  Filled 2014-03-19 (×7): qty 2

## 2014-03-19 MED ORDER — INSULIN ASPART 100 UNIT/ML ~~LOC~~ SOLN
0.0000 [IU] | Freq: Three times a day (TID) | SUBCUTANEOUS | Status: DC
  Administered 2014-03-20: 2 [IU] via SUBCUTANEOUS
  Administered 2014-03-20: 1 [IU] via SUBCUTANEOUS
  Administered 2014-03-21 (×2): 3 [IU] via SUBCUTANEOUS
  Administered 2014-03-21: 2 [IU] via SUBCUTANEOUS
  Administered 2014-03-22: 3 [IU] via SUBCUTANEOUS

## 2014-03-19 MED ORDER — LEVOFLOXACIN IN D5W 750 MG/150ML IV SOLN
750.0000 mg | Freq: Once | INTRAVENOUS | Status: AC
  Administered 2014-03-19: 750 mg via INTRAVENOUS
  Filled 2014-03-19: qty 150

## 2014-03-19 MED ORDER — ACETAMINOPHEN 650 MG RE SUPP
650.0000 mg | Freq: Once | RECTAL | Status: AC
  Administered 2014-03-19: 650 mg via RECTAL
  Filled 2014-03-19: qty 1

## 2014-03-19 MED ORDER — SODIUM CHLORIDE 0.9 % IJ SOLN
3.0000 mL | Freq: Two times a day (BID) | INTRAMUSCULAR | Status: DC
  Administered 2014-03-20 – 2014-03-23 (×4): 3 mL via INTRAVENOUS

## 2014-03-19 MED ORDER — FENTANYL CITRATE 0.05 MG/ML IJ SOLN
50.0000 ug | Freq: Once | INTRAMUSCULAR | Status: AC
  Administered 2014-03-19: 50 ug via INTRAVENOUS
  Filled 2014-03-19: qty 2

## 2014-03-19 MED ORDER — LEVOFLOXACIN IN D5W 750 MG/150ML IV SOLN: 750.0000 mg | INTRAVENOUS | Status: DC

## 2014-03-19 MED ORDER — VANCOMYCIN HCL IN DEXTROSE 1-5 GM/200ML-% IV SOLN
1000.0000 mg | Freq: Once | INTRAVENOUS | Status: AC
  Administered 2014-03-19: 1000 mg via INTRAVENOUS
  Filled 2014-03-19: qty 200

## 2014-03-19 NOTE — ED Notes (Signed)
Bed: WA20 Expected date:  Expected time:  Means of arrival:  Comments: Bedridden IVC

## 2014-03-19 NOTE — Consult Note (Signed)
CARDIOLOGY CONSULT NOTE   Patient ID: Robin Schroeder MRN: [id number] DOB/AGE: 12-08-1955 58 y.o.  Admit date: 03/19/2014  Primary Physician   No PCP Per Patient Primary Cardiologist  None Reason for Consultation  Abnormal ECG  HPI: 58 year old female with multiple medical problems including HTN, obesity, DM and chronic diastolic CHF who was discharged from the hospital in October after she was treated for MSSA bacteremia with sepsis who was forced to come to the hospital today by her husband for evaluation of infected decubitus ulcers. Cardiology was consulted for a mildly elevated POC troponin and some STE on her ECGs.  She was found to have MSSA bacteremia on 10/13, blood cultures cleared on 10/16 and she was placed on cefazolin. Patient underwent TEE on 10/19 which was negative for vegetations. She underwent rheumatology work up that was POSITIVE FOR RA, which she had not sought care for before. Sed rate >100, ana 1:160, CCPA > 300, RF>600. She was unable to have rheumatology consultation during hospitalizaiton. Patient was discharged to skilled nursing facility for IV antibiotics and rehabilitation. Patient is a very poor historian, and husband is not at bedside. Patient's sister is at bedside. So most of the history obtained from sister and ED physician. As per patient's husband, patient did not want to eat or drink and she is not eating and drinking for past 5 days and so he got concerned that she wants to hurt herself. So he filled out IVC and patient was brought by the EMS. Patient was discharged from the nursing home about 2 weeks ago and has been nonambulatory. There has been has been providing care with feeding and diaper change when needed. Patient does have decubitus ulcers. Patient denies any chest pain complains of back pain, no nausea vomiting or diarrhea. She has temp of 103.2, and was hypotensive. Patient started on empiric antibiotics including vancomycin, Azactam and  Levaquin by the ED physician. Father died of a MI at age 63 and her mother also had CAD with first event in ~45s. Ever since leaving the SNF she has not been able to get out of bed at all. She gets SOB every once and a while and occasionally gets chest pain.   Past Medical History  Diagnosis Date  . Hypertension   . Diabetes mellitus without complication   . Arthritis   . Coronary artery disease      Past Surgical History  Procedure Laterality Date  . Tee without cardioversion N/A 02/02/2014    Procedure: TRANSESOPHAGEAL ECHOCARDIOGRAM (TEE);  Surgeon: Donato Schultz, MD;  Location: [facility] ENDOSCOPY;  Service: Cardiovascular;  Laterality: N/A;    Allergies  Allergen Reactions  . Penicillins Rash    I have reviewed the patient's current medications   . [START ON 03/20/2014] aztreonam    . [START ON 03/20/2014] vancomycin     acetaminophen  Prior to Admission medications   Medication Sig Start Date End Date Taking? Authorizing Provider  calcium carbonate (OS-CAL) 600 MG TABS tablet Take 600 mg by mouth 2 (two) times daily with a meal.   Yes Historical Provider, MD  Cholecalciferol (VITAMIN D3) 5000 UNITS TABS Take 1 tablet by mouth daily.   Yes Historical Provider, MD  furosemide (LASIX) 40 MG tablet Take 1 tablet (40 mg total) by mouth daily. 02/04/14  Yes Zannie Cove, MD  oxyCODONE (OXY IR/ROXICODONE) 5 MG immediate release tablet Take 1 tablet (5 mg total) by mouth every 4 (four) hours as needed for moderate pain.  02/04/14  Yes Zannie Cove, MD  predniSONE (DELTASONE) 10 MG tablet Take 10 mg by mouth daily.   Yes Historical Provider, MD  polyethylene glycol (MIRALAX / GLYCOLAX) packet Take 17 g by mouth daily as needed for moderate constipation. Patient not taking: Reported on 03/19/2014 02/04/14   Zannie Cove, MD     History   Social History  . Marital Status: Married    Spouse Name: N/A    Number of Children: N/A  . Years of Education: N/A   Occupational History  .  Not on file.   Social History Main Topics  . Smoking status: Never Smoker   . Smokeless tobacco: Not on file  . Alcohol Use: No  . Drug Use: No  . Sexual Activity: Not on file   Other Topics Concern  . Not on file   Social History Narrative    Family Status  Relation Status Death Age  . Mother Deceased   . Father Deceased   . Sister Alive   . Brother Alive   . Maternal Grandmother Deceased   . Maternal Grandfather Deceased   . Paternal Grandmother Deceased   . Paternal Grandfather Deceased   . Sister Alive    Family History  Problem Relation Age of Onset  . Hypertension Brother      ROS:  Full 14 point review of systems complete and found to be negative unless listed above.  Physical Exam: Blood pressure 101/43, pulse 98, temperature 101.5 F (38.6 C), temperature source Oral, resp. rate 23, height 5\' 4"  (1.626 m), weight 215 lb (97.523 kg), SpO2 94 %.  General: Well developed, well nourished, female in no acute distress.  Head: Eyes PERRLA, No xanthomas.   Normocephalic and atraumatic, oropharynx without edema or exudate. :  Lungs: I could not listen to her lungs because she would not turn over and screamed when i tried to move her Heart: HRRR S1 S2, no rub/gallop, Heart irregular rate and rhythm with S1, S2  murmur. pulses are 2+ extrem.   Neck: No carotid bruits. No lymphadenopathy. No JVD. Abdomen: Bowel sounds present, abdomen soft and non-tender without masses or hernias noted. Msk:  No spine or cva tenderness. No weakness, no joint deformities or effusions. She will not move Extremities: No clubbing or cyanosis.  1+ LE edema.  Neuro: Alert and oriented X 3. No focal deficits noted. Psych:  Good affect, responds appropriately Skin: No rashes or lesions noted. Decubitus ulcers present  Labs:   Lab Results  Component Value Date   WBC 4.2 03/19/2014   HGB 7.9* 03/19/2014   HCT 24.7* 03/19/2014   MCV 84.3 03/19/2014   PLT 77* 03/19/2014    Recent Labs   03/19/14 1508  INR 1.29    Recent Labs Lab 03/19/14 1508  NA 121*  K 3.8  CL 82*  CO2 28  BUN 24*  CREATININE 0.37*  CALCIUM 7.8*  PROT 6.6  BILITOT 1.6*  ALKPHOS 219*  ALT 12  AST 46*  GLUCOSE 221*  ALBUMIN 1.2*    Recent Labs  03/19/14 1538  TROPIPOC 0.16*    Echo: Study Date: 01/28/2014 LV EF: 55% -  60% Study Conclusions - Left ventricle: There is a false tendon in the LV apex of no clinical significance. The cavity size was normal. There was mild concentric hypertrophy. Systolic function was normal. The estimated ejection fraction was in the range of 55% to 60%. Wall motion was normal; there were no regional wall motion abnormalities. There  was an increased relative contribution of atrial contraction to ventricular filling. Doppler parameters are consistent with abnormal left ventricular relaxation (grade 1 diastolic dysfunction). - Mitral valve: There was mild regurgitation. - Pulmonary arteries: PA peak pressure: 37 mm Hg (S). Impressions: - The right ventricular systolic pressure was increased consistent with mild pulmonary hypertension.  ECG:  HR 107 Sinus tachycardia ST elevation, consider inferior injury  Radiology:  Dg Chest Port 1 View  (if Code Sepsis Called)  03/19/2014   CLINICAL DATA:  Failure to thrive  EXAM: PORTABLE CHEST - 1 VIEW  COMPARISON:  01/27/2014  FINDINGS: Patient is rotated considerably to the left and the study was not repeated. Study is significantly limited. Lungs are grossly clear.  IMPRESSION: Recommend repeat study. The patient is rotated considerably. Lungs are grossly clear.   Electronically Signed   By: Marlan Palau M.D.   On: 03/19/2014 14:51    ASSESSMENT AND PLAN:    Active Problems:   Sepsis  58 year old female with multiple medical problems including HTN, obesity, DM and chronic diastolic CHF who was discharged from the hospital in October after she was treated for MSSA bacteremia with sepsis  who was forced to come to the hospital today by her husband for evaluation of infected decubitus ulcers. Cardiology was consulted for a mildly elevated POC troponin and some STE on her ECGs.   ECG changes and mild elevated troponin- POC troponin 0.16. ECG with slight STE inferior leads but non diagnostic. -- Patient has multiple other issues that need sorting out including sepsis, anemia and thrombocytopenia. We will proceed with medical treatment for now.   Dispo- will need her goals of care addressed.   Please see Dr Waunita Schooner note for further recommendations. We will follow with you     Signed: Janetta Hora, PA-C 03/19/2014 5:16 PM  Pager [phone]  Co-Sign MD  As above, patient seen and examined. Briefly she is a 58 year old female with a past medical history of diabetes mellitus, hypertension, decubitus ulcers for evaluation of abnormal troponin. Recently admitted with bacteremia in October. Patient was treated with antibiotics. Transesophageal echocardiogram showed vigorous LV function and mild mitral regurgitation but no vegetations. Patient is essentially bedbound and unable to ambulate. She apparently has not wanted to eat or drink and is incontinent. She was forced to come to the hospital by her husband for infected decubitus ulcers. Patient is a very poor historian but denies dyspnea or chest pain; otherwise states she hurts in her shoulders and knees. Her initial temperature was 103.2. Her blood pressure has been borderline. Sodium 121. Platelet count 77 and hemoglobin 7.9. Troponin 0.16. Electrocardiogram shows sinus rhythm with minor nondiagnostic ST changes.  Patient's elevated troponin is most likely related to demand ischemia in the setting of sepsis. Regardless given her overall condition and ongoing medical problems including severe hyponatremia, sepsis, thrombocytopenia, bedbound status, decubitus ulcers she would not be a good candidate for aggressive cardiac  evaluation. Would continue to cycle enzymes. Would treat with aspirin. Would not add intravenous heparin at this point as this is most likely demand ischemia and she also has significant anemia/thrombocytopenia. Continue DVT prophylaxis. Could add low-dose beta blocker and statin later as she improves. Unless she makes significant improvement would not pursue further cardiac evaluation other than echocardiogram to assess LV function. CODE STATUS should be addressed. Agree with antibiotics. Olga Millers

## 2014-03-19 NOTE — ED Provider Notes (Signed)
CSN: [id number]     Arrival date & time 03/19/14  1338 History   First MD Initiated Contact with Patient 03/19/14 1417     Chief Complaint  Patient presents with  . Failure To Thrive     The history is provided by the patient and the spouse. The history is limited by the condition of the patient (Level V caveat).   Patient is brought in by her husband for not wanting to eat or drink. Her husband reports that she has not been eating or drinking for the last 5 days and he is concerned that she wants to hurt herself so he filled out an IVC on her. He states that she has made comments that she does not want to live anymore. She was discharged from a nursing home about 2 weeks ago to home and she has been nonambulatory since that time. He has been providing care for her with feeding her and providing diaper changes when needed and adjusting her in bed. He is not aware of any fevers or vomiting. He states that she has some bed sores that appear become much worse over the last 2 days. Patient will provide limited history, she states that she hurts all over. She denies SI but also states she doesn't want to live anymore.  Past Medical History  Diagnosis Date  . Hypertension   . Diabetes mellitus without complication   . Arthritis   . Coronary artery disease    Past Surgical History  Procedure Laterality Date  . Tee without cardioversion N/A 02/02/2014    Procedure: TRANSESOPHAGEAL ECHOCARDIOGRAM (TEE);  Surgeon: Donato Schultz, MD;  Location: [facility] ENDOSCOPY;  Service: Cardiovascular;  Laterality: N/A;   Family History  Problem Relation Age of Onset  . Hypertension Brother    History  Substance Use Topics  . Smoking status: Never Smoker   . Smokeless tobacco: Not on file  . Alcohol Use: No   OB History    No data available     Review of Systems  Unable to perform ROS     Allergies  Penicillins  Home Medications   Prior to Admission medications   Medication Sig Start Date End Date  Taking? Authorizing Provider  calcium carbonate (OS-CAL) 600 MG TABS tablet Take 600 mg by mouth 2 (two) times daily with a meal.   Yes Historical Provider, MD  Cholecalciferol (VITAMIN D3) 5000 UNITS TABS Take 1 tablet by mouth daily.   Yes Historical Provider, MD  furosemide (LASIX) 40 MG tablet Take 1 tablet (40 mg total) by mouth daily. 02/04/14  Yes Zannie Cove, MD  oxyCODONE (OXY IR/ROXICODONE) 5 MG immediate release tablet Take 1 tablet (5 mg total) by mouth every 4 (four) hours as needed for moderate pain. 02/04/14  Yes Zannie Cove, MD  predniSONE (DELTASONE) 10 MG tablet Take 10 mg by mouth daily.   Yes Historical Provider, MD  polyethylene glycol (MIRALAX / GLYCOLAX) packet Take 17 g by mouth daily as needed for moderate constipation. Patient not taking: Reported on 03/19/2014 02/04/14   Zannie Cove, MD   BP 101/43 mmHg  Pulse 98  Temp(Src) 101.5 F (38.6 C) (Oral)  Resp 23  Ht 5\' 4"  (1.626 m)  Wt 215 lb (97.523 kg)  BMI 36.89 kg/m2  SpO2 94% Physical Exam  Constitutional:  Severe distress, chronically ill-appearing.  HENT:  Head: Atraumatic.  Dry mucous membranes  Eyes: Pupils are equal, round, and reactive to light.  Cardiovascular:  Tachycardic, no murmur  Pulmonary/Chest:  Tachypneic with decreased breath sounds in bilateral bases.  Abdominal:  Soft with mild diffuse abdominal tenderness. There is ecchymosis of the anterior chest wall and abdominal wall. There is ecchymosis over the right upper back  Genitourinary:  There is some necrotic decubitus ulcer on the left sacrum and gluteal region. This wound is foul smelling.  Musculoskeletal:  2-3+ pitting edema of left upper extremity and bilateral lower extremities. There is a hemorrhagic bulla on the left medial malleolus.  Neurological: She is alert.  Disoriented to place and time. Patient wiggles left hands but has no grip strength in the left hands. She has 3 out of 5 strength in the right hand.  Skin:  Skin is warm and dry.  Psychiatric:  Depressed mood and affect  Nursing note and vitals reviewed.   ED Course  Procedures (including critical care time) Labs Review  CRITICAL CARE Performed by: Tilden Fossa   Total critical care time: 45  Critical care time was exclusive of separately billable procedures and treating other patients.  Critical care was necessary to treat or prevent imminent or life-threatening deterioration.  Critical care was time spent personally by me on the following activities: development of treatment plan with patient and/or surrogate as well as nursing, discussions with consultants, evaluation of patient's response to treatment, examination of patient, obtaining history from patient or surrogate, ordering and performing treatments and interventions, ordering and review of laboratory studies, ordering and review of radiographic studies, pulse oximetry and re-evaluation of patient's condition.  Labs Reviewed  CBC WITH DIFFERENTIAL - Abnormal; Notable for the following:    RBC 2.93 (*)    Hemoglobin 7.9 (*)    HCT 24.7 (*)    RDW 17.0 (*)    Platelets 77 (*)    Neutrophils Relative % 89 (*)    Lymphocytes Relative 8 (*)    Lymphs Abs 0.3 (*)    All other components within normal limits  COMPREHENSIVE METABOLIC PANEL - Abnormal; Notable for the following:    Sodium 121 (*)    Chloride 82 (*)    Glucose, Bld 221 (*)    BUN 24 (*)    Creatinine, Ser 0.37 (*)    Calcium 7.8 (*)    Albumin 1.2 (*)    AST 46 (*)    Alkaline Phosphatase 219 (*)    Total Bilirubin 1.6 (*)    All other components within normal limits  URINALYSIS, ROUTINE W REFLEX MICROSCOPIC - Abnormal; Notable for the following:    Color, Urine AMBER (*)    APPearance CLOUDY (*)    Hgb urine dipstick LARGE (*)    Bilirubin Urine SMALL (*)    Urobilinogen, UA 2.0 (*)    Leukocytes, UA SMALL (*)    All other components within normal limits  BLOOD GAS, ARTERIAL - Abnormal; Notable for  the following:    pH, Arterial 7.484 (*)    pO2, Arterial 78.7 (*)    Bicarbonate 29.4 (*)    Acid-Base Excess 6.5 (*)    All other components within normal limits  PROTIME-INR - Abnormal; Notable for the following:    Prothrombin Time 16.2 (*)    All other components within normal limits  URINE MICROSCOPIC-ADD ON - Abnormal; Notable for the following:    Squamous Epithelial / LPF MANY (*)    Bacteria, UA MANY (*)    Casts GRANULAR CAST (*)    All other components within normal limits  I-STAT CG4 LACTIC ACID, ED - Abnormal; Notable  for the following:    Lactic Acid, Venous 2.53 (*)    All other components within normal limits  I-STAT CG4 LACTIC ACID, ED - Abnormal; Notable for the following:    Lactic Acid, Venous 2.52 (*)    All other components within normal limits  I-STAT TROPOININ, ED - Abnormal; Notable for the following:    Troponin i, poc 0.16 (*)    All other components within normal limits  CULTURE, BLOOD (ROUTINE X 2)  CULTURE, BLOOD (ROUTINE X 2)  URINE CULTURE  I-STAT CG4 LACTIC ACID, ED  TYPE AND SCREEN  ABO/RH    Imaging Review Dg Chest Port 1 View  (if Code Sepsis Called)  03/19/2014   CLINICAL DATA:  Failure to thrive  EXAM: PORTABLE CHEST - 1 VIEW  COMPARISON:  01/27/2014  FINDINGS: Patient is rotated considerably to the left and the study was not repeated. Study is significantly limited. Lungs are grossly clear.  IMPRESSION: Recommend repeat study. The patient is rotated considerably. Lungs are grossly clear.   Electronically Signed   By: Marlan Palau M.D.   On: 03/19/2014 14:51     EKG Interpretation   Date/Time:  Thursday March 19 2014 15:57:06 EST Ventricular Rate:  107 PR Interval:  141 QRS Duration: 78 QT Interval:  328 QTC Calculation: 438 R Axis:   29 Text Interpretation:  Sinus tachycardia ST elevation with baseline wander  in III, aVF Confirmed by Lincoln Brigham [phone]) on 03/19/2014 5:23:15 PM      MDM   Final diagnoses:  Severe sepsis   Dehydration with hyponatremia    Patient here for failure to thrive by husband, IVC papers filled out by husband because patient did not want to come in for evaluation. Patient is critically ill in the emergency department and does not seem to be understanding her current illness. There is concern for severe sepsis given her fever and her current state, treating him. Clearly with broad-spectrum antibiotics pending full evaluation. UA is concerning for UTI there is concern for wound infection on her decubitus ulcer site. Patient has an elevated troponin of unclear significance, cardiology consulted. EKG not c/w STEMI.  Pt without current chest pain. Discussed with medicine regarding admission for further resuscitation and evaluation.    Tilden Fossa, MD 03/19/14 1743

## 2014-03-19 NOTE — ED Notes (Signed)
Hospitalist at bedside 

## 2014-03-19 NOTE — ED Notes (Signed)
I-Stat troponin results shown to [facility].

## 2014-03-19 NOTE — H&P (Signed)
PCP:   No PCP Per Patient   Chief Complaint:  Fever  HPI:   58 year old female with multiple medical problems who was discharged from the hospital in October after she was treated for MSSA bacteremia with sepsis. She was found to have MSSA bacteremia on 10/13, blood cultures cleared on 10/16 and she was placed on cefazolin. Patient underwent TEE on 1019 which was negative for vegetations. Patient was discharged to skilled nursing facility for IV antibiotics and rehabilitation. Patient is a very poor historian, and husband is not at bedside. Patient's sister is at bedside. So most of the history obtained from sister and ED physician. As per patient's husband, patient did not want to eat or drink and she is not eating and drinking for past 5 days and so he got concerned that she wants to hurt herself. So he filled out IVC and patient was brought by the EMS. Patient was discharged from the nursing home about 2 weeks ago and has been nonambulatory. There has been has been providing care with feeding and diaper change when needed. Patient does have decubitus ulcers. Patient denies any chest pain complains of back pain, no nausea vomiting or diarrhea. She has temp of 103.2, and was hypotensive. Patient started on empiric antibiotics including vancomycin, Azactam and Levaquin by the ED physician.   Allergies:   Allergies  Allergen Reactions  . Penicillins Rash      Past Medical History  Diagnosis Date  . Hypertension   . Diabetes mellitus without complication   . Arthritis   . Coronary artery disease     Past Surgical History  Procedure Laterality Date  . Tee without cardioversion N/A 02/02/2014    Procedure: TRANSESOPHAGEAL ECHOCARDIOGRAM (TEE);  Surgeon: Donato Schultz, MD;  Location: [facility] ENDOSCOPY;  Service: Cardiovascular;  Laterality: N/A;    Prior to Admission medications   Medication Sig Start Date End Date Taking? Authorizing Provider  calcium carbonate (OS-CAL) 600 MG TABS  tablet Take 600 mg by mouth 2 (two) times daily with a meal.   Yes Historical Provider, MD  Cholecalciferol (VITAMIN D3) 5000 UNITS TABS Take 1 tablet by mouth daily.   Yes Historical Provider, MD  furosemide (LASIX) 40 MG tablet Take 1 tablet (40 mg total) by mouth daily. 02/04/14  Yes Zannie Cove, MD  oxyCODONE (OXY IR/ROXICODONE) 5 MG immediate release tablet Take 1 tablet (5 mg total) by mouth every 4 (four) hours as needed for moderate pain. 02/04/14  Yes Zannie Cove, MD  predniSONE (DELTASONE) 10 MG tablet Take 10 mg by mouth daily.   Yes Historical Provider, MD  polyethylene glycol (MIRALAX / GLYCOLAX) packet Take 17 g by mouth daily as needed for moderate constipation. Patient not taking: Reported on 03/19/2014 02/04/14   Zannie Cove, MD    Social History:  reports that she has never smoked. She does not have any smokeless tobacco history on file. She reports that she does not drink alcohol or use illicit drugs.  Family History  Problem Relation Age of Onset  . Hypertension Brother      All the positives are listed in BOLD  Unable to obtain except for as in history of present illness   Physical Exam: Blood pressure 101/43, pulse 98, temperature 101.5 F (38.6 C), temperature source Oral, resp. rate 23, height 5\' 4"  (1.626 m), weight 97.523 kg (215 lb), SpO2 94 %. Constitutional:   Patient is  chronically ill-appearing female in no acute distress and cooperative with exam. Head: Normocephalic and  atraumatic Mouth: Mucus membranes moist Eyes: PERRL, EOMI, conjunctivae normal Neck: Supple, No Thyromegaly Cardiovascular: RRR, S1 normal, S2 normal Pulmonary/Chest: CTAB, no wheezes, rales, or rhonchi Abdominal: Soft. Non-tender, non-distended, bowel sounds are normal, no masses, organomegaly, or guarding present.  Neurological: A&O x3, Strength is normal and symmetric bilaterally, cranial nerve II-XII are grossly intact, no focal motor deficit, sensory intact to light  touch bilaterally.  Extremities : No Cyanosis, Clubbing or Edema Skin - patient has large 5 x 3 cm stage II ulcer noted on the buttock , also noted large blister on the heel of the left foot   Labs on Admission:  Basic Metabolic Panel:  Recent Labs Lab 03/19/14 1508  NA 121*  K 3.8  CL 82*  CO2 28  GLUCOSE 221*  BUN 24*  CREATININE 0.37*  CALCIUM 7.8*   Liver Function Tests:  Recent Labs Lab 03/19/14 1508  AST 46*  ALT 12  ALKPHOS 219*  BILITOT 1.6*  PROT 6.6  ALBUMIN 1.2*   No results for input(s): LIPASE, AMYLASE in the last 168 hours. No results for input(s): AMMONIA in the last 168 hours. CBC:  Recent Labs Lab 03/19/14 1508  WBC 4.2  NEUTROABS 3.8  HGB 7.9*  HCT 24.7*  MCV 84.3  PLT 77*    Radiological Exams on Admission: Dg Chest Port 1 View  (if Code Sepsis Called)  03/19/2014   CLINICAL DATA:  Failure to thrive  EXAM: PORTABLE CHEST - 1 VIEW  COMPARISON:  01/27/2014  FINDINGS: Patient is rotated considerably to the left and the study was not repeated. Study is significantly limited. Lungs are grossly clear.  IMPRESSION: Recommend repeat study. The patient is rotated considerably. Lungs are grossly clear.   Electronically Signed   By: Marlan Palau M.D.   On: 03/19/2014 14:51    EKG: Independently reviewed. Sinus tachycardia  Chest x-ray independently reviewed, no significant abnormality   Assessment/Plan Active Problems:   Sepsis   DM2 (diabetes mellitus, type 2)   Physical deconditioning   Hypoalbuminemia  Sepsis  Patient is presenting with hypotension, fever, tachypnea, meeting the criteria for SIRS. Patient will be started on vancomycin and is that time, will hold Levaquin as per ID recommendation. Blood cultures 2 have been obtained and urine culture have been obtained. Patient's UA does show abnormality. Will follow the results. Patient will be admitted to the stepdown unit and will provide aggressive hydration with normal saline at 125  per hour. Called and discussed Dr. Rondel Jumbo Dam, who will see the patient as consult.  Hyponatremia Patient's last sodium was 136 on 02/12/2014,  and today's sodium is 121. Likely due to dehydration and poor by mouth intake. Will provide aggressive IV fluids and follow BMP in a.m.  Elevated troponin Likely from demand ischemia. Tachycardia and hypotension Patient's troponin is mildly elevated to 0.16, EKG shows sinus tachycardia. Cardiology has been consulted by the ED physician. We'll cycle the cardiac enzymes  Suicidal ideation Patient was not eating and drinking at home and IVC paperwork was completed by the husband, at this time patient is involuntarily committed. Will put one-to-one sitter for suicide precautions.  Diabetes mellitus We'll start the patient on sliding-scale insulin with NovoLog  Malnutrition Patient's albumin level is 1.2, has physical deconditioning. Patient is currently bedbound. Once more stable consider nutrition consult.  Decubitus ulcer Patient has large decubitus ulcer, will obtain wound care consultation.  DVT prophylaxis Lovenox  Code status:Patient is full code  Family discussion: Admission, patients condition and  plan of care including tests being ordered have been discussed with the patient and her sister at bedside* who indicate understanding and agree with the plan and Code Status.   Time Spent on Admission: 75 minutes  Salima Rumer S [facility] Pager: [phone] 03/19/2014, 5:31 PM  If 7PM-7AM, please contact night-coverage  [email or web address]  Password [email or web address]

## 2014-03-19 NOTE — Progress Notes (Signed)
ANTIBIOTIC CONSULT NOTE - INITIAL  Pharmacy Consult for Vancomycin, Aztreonam, Levaquin Indication: rule out sepsis  Allergies  Allergen Reactions  . Penicillins Rash    Patient Measurements: Height: 5\' 4"  (162.6 cm) Weight: 215 lb (97.523 kg) IBW/kg (Calculated) : 54.7  Vital Signs: Temp: 101.5 F (38.6 C) (12/03 1434) Temp Source: Oral (12/03 1434) BP: 100/45 mmHg (12/03 1601) Pulse Rate: 105 (12/03 1601) Intake/Output from previous day:   Intake/Output from this shift:    Labs:  Recent Labs  03/19/14 1508  WBC 4.2  HGB 7.9*  PLT 77*  CREATININE 0.37*   Estimated Creatinine Clearance: 84.8 mL/min (by C-G formula based on Cr of 0.37). No results for input(s): VANCOTROUGH, VANCOPEAK, VANCORANDOM, GENTTROUGH, GENTPEAK, GENTRANDOM, TOBRATROUGH, TOBRAPEAK, TOBRARND, AMIKACINPEAK, AMIKACINTROU, AMIKACIN in the last 72 hours.   Microbiology: No results found for this or any previous visit (from the past 720 hour(s)).  Medical History: Past Medical History  Diagnosis Date  . Hypertension   . Diabetes mellitus without complication   . Arthritis   . Coronary artery disease     Medications:  Scheduled:   Infusions:  . levofloxacin (LEVAQUIN) IV 750 mg (03/19/14 1519)  . sodium chloride 1,000 mL (03/19/14 1606)  . vancomycin 1,000 mg (03/19/14 1604)   PRN: acetaminophen  Assessment: 58 yo female with RA and recent MSSA bacteremia 10/13, blood cx cleared on 10/16, placed on cefazolin 2gm Q8hr. Underwent TEE on 10/19 , negative for vegetations. She was to finish 14 day course on 10/30 using 10/16 as day 1. She was discharged to SNF for IV abx and rehab. Today, 12/3, brought to ED for IVC as pt refusing care. Pharmacy consulted to dose vancomycin, aztreonam and levaquin for presumed sepsis.  12/3 >> Vancomycin >> 12/3 >> Aztreonam >> 12/3 >> Levaquin >>  Tmax: 101.5 WBC: 4.2k Renal: CrCl 84 ml/min CG and N Lactate: 2.53  12/3 blood: sent 12/3 urine:  sent  Levels/Dose changes: Previous dosing in Oct: 1500mg  q12h produced trough 22.4 with similar SCr  Goal of Therapy:  Vancomycin trough level 15-20 mcg/ml  Antibiotic dosage per indication, renal function  Plan:   Vancomycin 1g IV given in ED, continue with 1250mg  IV q12h based on previous dosing in October  Aztreonam 2g IV q8h  Levaquin 750mg  IV q24h Follow up renal function & cultures   , PharmD, BCPS Pager: [phone] 03/19/2014,4:24 PM

## 2014-03-19 NOTE — ED Notes (Signed)
[place] EDP made aware of CG4 Lactic results.

## 2014-03-19 NOTE — ED Notes (Signed)
Pt IVC d/t being a potential threat to self. Refusing any home care, rehab, washing, medications. Now developing bedsores, not eating, she has been drinking. [facility] for [facility] problems and refused care while there also. Husband w/ patient at this time

## 2014-03-19 NOTE — ED Notes (Signed)
Below orders not completed by EW. 

## 2014-03-19 NOTE — Progress Notes (Signed)
Utilization Review completed.  Graceanne Guin RN CM  

## 2014-03-20 ENCOUNTER — Inpatient Hospital Stay (HOSPITAL_COMMUNITY): Payer: Medicaid Other

## 2014-03-20 DIAGNOSIS — R7989 Other specified abnormal findings of blood chemistry: Secondary | ICD-10-CM

## 2014-03-20 DIAGNOSIS — E871 Hypo-osmolality and hyponatremia: Secondary | ICD-10-CM

## 2014-03-20 DIAGNOSIS — L97509 Non-pressure chronic ulcer of other part of unspecified foot with unspecified severity: Secondary | ICD-10-CM

## 2014-03-20 DIAGNOSIS — R6520 Severe sepsis without septic shock: Secondary | ICD-10-CM

## 2014-03-20 DIAGNOSIS — E08621 Diabetes mellitus due to underlying condition with foot ulcer: Secondary | ICD-10-CM

## 2014-03-20 DIAGNOSIS — N390 Urinary tract infection, site not specified: Secondary | ICD-10-CM | POA: Insufficient documentation

## 2014-03-20 DIAGNOSIS — E11621 Type 2 diabetes mellitus with foot ulcer: Secondary | ICD-10-CM

## 2014-03-20 DIAGNOSIS — A419 Sepsis, unspecified organism: Secondary | ICD-10-CM | POA: Insufficient documentation

## 2014-03-20 DIAGNOSIS — L97429 Non-pressure chronic ulcer of left heel and midfoot with unspecified severity: Secondary | ICD-10-CM

## 2014-03-20 DIAGNOSIS — J810 Acute pulmonary edema: Secondary | ICD-10-CM

## 2014-03-20 DIAGNOSIS — I471 Supraventricular tachycardia: Secondary | ICD-10-CM

## 2014-03-20 DIAGNOSIS — M2540 Effusion, unspecified joint: Secondary | ICD-10-CM | POA: Insufficient documentation

## 2014-03-20 DIAGNOSIS — E43 Unspecified severe protein-calorie malnutrition: Secondary | ICD-10-CM | POA: Insufficient documentation

## 2014-03-20 DIAGNOSIS — L89159 Pressure ulcer of sacral region, unspecified stage: Secondary | ICD-10-CM | POA: Insufficient documentation

## 2014-03-20 DIAGNOSIS — R627 Adult failure to thrive: Secondary | ICD-10-CM

## 2014-03-20 DIAGNOSIS — I214 Non-ST elevation (NSTEMI) myocardial infarction: Secondary | ICD-10-CM

## 2014-03-20 DIAGNOSIS — E1101 Type 2 diabetes mellitus with hyperosmolarity with coma: Secondary | ICD-10-CM

## 2014-03-20 LAB — COMPREHENSIVE METABOLIC PANEL
ALBUMIN: 0.9 g/dL — AB (ref 3.5–5.2)
ALT: 11 U/L (ref 0–35)
AST: 60 U/L — ABNORMAL HIGH (ref 0–37)
Alkaline Phosphatase: 157 U/L — ABNORMAL HIGH (ref 39–117)
Anion gap: 9 (ref 5–15)
BUN: 17 mg/dL (ref 6–23)
CO2: 22 mEq/L (ref 19–32)
Calcium: 6.5 mg/dL — ABNORMAL LOW (ref 8.4–10.5)
Chloride: 98 mEq/L (ref 96–112)
Creatinine, Ser: 0.29 mg/dL — ABNORMAL LOW (ref 0.50–1.10)
GFR calc non Af Amer: >90 mL/min (ref >90)
Glucose, Bld: 150 mg/dL — ABNORMAL HIGH (ref 70–99)
Potassium: 3.2 mEq/L — ABNORMAL LOW (ref 3.7–5.3)
Sodium: 129 mEq/L — ABNORMAL LOW (ref 137–147)
TOTAL PROTEIN: 4.9 g/dL — AB (ref 6.0–8.3)
Total Bilirubin: 1 mg/dL (ref 0.3–1.2)

## 2014-03-20 LAB — CBC
HCT: 27.1 % — ABNORMAL LOW (ref 36.0–46.0)
HEMATOCRIT: 21.9 % — AB (ref 36.0–46.0)
HEMOGLOBIN: 7 g/dL — AB (ref 12.0–15.0)
HEMOGLOBIN: 8.8 g/dL — AB (ref 12.0–15.0)
MCH: 27.5 pg (ref 26.0–34.0)
MCH: 27.2 pg (ref 26.0–34.0)
MCHC: 32 g/dL (ref 30.0–36.0)
MCHC: 32.5 g/dL (ref 30.0–36.0)
MCV: 85.9 fL (ref 78.0–100.0)
MCV: 83.9 fL (ref 78.0–100.0)
Platelets: 48 10*3/uL — ABNORMAL LOW (ref 150–400)
Platelets: 63 10*3/uL — ABNORMAL LOW (ref 150–400)
RBC: 2.55 MIL/uL — ABNORMAL LOW (ref 3.87–5.11)
RBC: 3.23 MIL/uL — AB (ref 3.87–5.11)
RDW: 16.8 % — ABNORMAL HIGH (ref 11.5–15.5)
RDW: 16.4 % — ABNORMAL HIGH (ref 11.5–15.5)
WBC: 3.3 10*3/uL — AB (ref 4.0–10.5)
WBC: 4 10*3/uL (ref 4.0–10.5)

## 2014-03-20 LAB — GLUCOSE, CAPILLARY
GLUCOSE-CAPILLARY: 102 mg/dL — AB (ref 70–99)
Glucose-Capillary: 154 mg/dL — ABNORMAL HIGH (ref 70–99)
Glucose-Capillary: 148 mg/dL — ABNORMAL HIGH (ref 70–99)
Glucose-Capillary: 170 mg/dL — ABNORMAL HIGH (ref 70–99)

## 2014-03-20 LAB — MAGNESIUM: Magnesium: 1.6 mg/dL (ref 1.5–2.5)

## 2014-03-20 LAB — CORTISOL: Cortisol, Plasma: 31.5 ug/dL

## 2014-03-20 LAB — TROPONIN I
Troponin I: 7.66 ng/mL (ref <0.30)
Troponin I: >20 ng/mL (ref <0.30)

## 2014-03-20 LAB — LACTIC ACID, PLASMA: LACTIC ACID, VENOUS: 1.6 mmol/L (ref 0.5–2.2)

## 2014-03-20 LAB — HEMOGLOBIN A1C
HEMOGLOBIN A1C: 7 % — AB (ref <5.7)
Mean Plasma Glucose: 154 mg/dL — ABNORMAL HIGH (ref <117)

## 2014-03-20 LAB — PREPARE RBC (CROSSMATCH)

## 2014-03-20 MED ORDER — ATORVASTATIN CALCIUM 20 MG PO TABS
20.0000 mg | ORAL_TABLET | Freq: Every day | ORAL | Status: DC
  Administered 2014-03-20 – 2014-03-31 (×8): 20 mg via ORAL
  Filled 2014-03-20 (×4): qty 2
  Filled 2014-03-20 (×2): qty 1
  Filled 2014-03-20: qty 2
  Filled 2014-03-20 (×2): qty 1

## 2014-03-20 MED ORDER — HYDROCORTISONE NA SUCCINATE PF 100 MG IJ SOLR
100.0000 mg | Freq: Three times a day (TID) | INTRAMUSCULAR | Status: DC
  Administered 2014-03-20 (×2): 100 mg via INTRAVENOUS

## 2014-03-20 MED ORDER — POTASSIUM CHLORIDE 10 MEQ/100ML IV SOLN
10.0000 meq | INTRAVENOUS | Status: DC
  Administered 2014-03-20: 10 meq via INTRAVENOUS

## 2014-03-20 MED ORDER — HYDROCORTISONE NA SUCCINATE PF 100 MG IJ SOLR
50.0000 mg | Freq: Four times a day (QID) | INTRAMUSCULAR | Status: DC
  Administered 2014-03-20 – 2014-03-23 (×10): 50 mg via INTRAVENOUS
  Filled 2014-03-20 (×7): qty 2

## 2014-03-20 MED ORDER — HYDROCORTISONE NA SUCCINATE PF 100 MG IJ SOLR
INTRAMUSCULAR | Status: AC
  Filled 2014-03-20: qty 2

## 2014-03-20 MED ORDER — SODIUM CHLORIDE 0.9 % IV SOLN
Freq: Once | INTRAVENOUS | Status: AC
  Administered 2014-03-20: 03:00:00 via INTRAVENOUS

## 2014-03-20 MED ORDER — CARVEDILOL 3.125 MG PO TABS
ORAL_TABLET | ORAL | Status: AC
  Filled 2014-03-20: qty 1

## 2014-03-20 MED ORDER — HYDROMORPHONE HCL 1 MG/ML IJ SOLN: 0.5000 mg | INTRAMUSCULAR | Status: DC | PRN

## 2014-03-20 MED ORDER — CHLORHEXIDINE GLUCONATE 0.12 % MT SOLN
15.0000 mL | Freq: Two times a day (BID) | OROMUCOSAL | Status: DC
  Administered 2014-03-20 – 2014-04-06 (×29): 15 mL via OROMUCOSAL
  Filled 2014-03-20 (×33): qty 15

## 2014-03-20 MED ORDER — FLUDROCORTISONE 0.1 MG/ML ORAL SUSPENSION
0.1000 mg | Freq: Every day | ORAL | Status: DC
  Filled 2014-03-20: qty 1

## 2014-03-20 MED ORDER — HYDROCODONE-ACETAMINOPHEN 5-325 MG PO TABS: 1.0000 | ORAL_TABLET | Freq: Four times a day (QID) | ORAL | Status: DC | PRN

## 2014-03-20 MED ORDER — SODIUM CHLORIDE 0.9 % IV BOLUS (SEPSIS)
1000.0000 mL | Freq: Once | INTRAVENOUS | Status: AC
  Administered 2014-03-20: 1000 mL via INTRAVENOUS

## 2014-03-20 MED ORDER — CHLORHEXIDINE GLUCONATE 0.12 % MT SOLN
OROMUCOSAL | Status: AC
  Filled 2014-03-20: qty 15

## 2014-03-20 MED ORDER — ATORVASTATIN CALCIUM 10 MG PO TABS
ORAL_TABLET | ORAL | Status: AC
  Filled 2014-03-20: qty 2

## 2014-03-20 MED ORDER — COLLAGENASE 250 UNIT/GM EX OINT
TOPICAL_OINTMENT | Freq: Every day | CUTANEOUS | Status: DC
  Administered 2014-03-20 – 2014-03-23 (×4): via TOPICAL
  Administered 2014-03-25: 1 via TOPICAL
  Administered 2014-03-26 – 2014-03-27 (×2): via TOPICAL
  Filled 2014-03-20 (×3): qty 30

## 2014-03-20 MED ORDER — CETYLPYRIDINIUM CHLORIDE 0.05 % MT LIQD
7.0000 mL | Freq: Two times a day (BID) | OROMUCOSAL | Status: DC
  Administered 2014-03-20 – 2014-04-05 (×27): 7 mL via OROMUCOSAL

## 2014-03-20 MED ORDER — FLUDROCORTISONE ACETATE 0.1 MG PO TABS
0.1000 mg | ORAL_TABLET | Freq: Every day | ORAL | Status: DC
  Administered 2014-03-20 – 2014-03-31 (×11): 0.1 mg via ORAL
  Filled 2014-03-20 (×14): qty 1

## 2014-03-20 MED ORDER — POTASSIUM CHLORIDE CRYS ER 20 MEQ PO TBCR
EXTENDED_RELEASE_TABLET | ORAL | Status: AC
  Filled 2014-03-20: qty 2

## 2014-03-20 MED ORDER — SODIUM CHLORIDE 0.9 % IV BOLUS (SEPSIS)
1000.0000 mL | Freq: Once | INTRAVENOUS | Status: AC
  Administered 2014-03-19: 1000 mL via INTRAVENOUS

## 2014-03-20 MED ORDER — POTASSIUM CHLORIDE 10 MEQ/100ML IV SOLN
INTRAVENOUS | Status: AC
  Filled 2014-03-20: qty 100

## 2014-03-20 MED ORDER — CARVEDILOL 3.125 MG PO TABS
3.1250 mg | ORAL_TABLET | Freq: Two times a day (BID) | ORAL | Status: DC
  Administered 2014-03-20: 3.125 mg via ORAL

## 2014-03-20 MED ORDER — GLUCERNA SHAKE PO LIQD
237.0000 mL | Freq: Three times a day (TID) | ORAL | Status: DC
  Administered 2014-03-21 – 2014-03-24 (×8): 237 mL via ORAL
  Filled 2014-03-20 (×14): qty 237

## 2014-03-20 MED ORDER — POTASSIUM CHLORIDE CRYS ER 20 MEQ PO TBCR
40.0000 meq | EXTENDED_RELEASE_TABLET | Freq: Once | ORAL | Status: AC
  Administered 2014-03-20: 40 meq via ORAL

## 2014-03-20 MED ORDER — SODIUM CHLORIDE 0.9 % IV SOLN
INTRAVENOUS | Status: DC
  Administered 2014-03-20 (×2): via INTRAVENOUS

## 2014-03-20 MED ORDER — PHENYLEPHRINE HCL 10 MG/ML IJ SOLN
30.0000 ug/min | INTRAVENOUS | Status: DC
  Administered 2014-03-20: 30 ug/min via INTRAVENOUS
  Administered 2014-03-20: 45 ug/min via INTRAVENOUS
  Administered 2014-03-21: 65 ug/min via INTRAVENOUS
  Administered 2014-03-21 (×2): 50 ug/min via INTRAVENOUS
  Administered 2014-03-21: 40 ug/min via INTRAVENOUS
  Administered 2014-03-21: 20 ug/min via INTRAVENOUS
  Administered 2014-03-22 (×2): 50 ug/min via INTRAVENOUS
  Administered 2014-03-22: 40 ug/min via INTRAVENOUS
  Administered 2014-03-22: 44 ug/min via INTRAVENOUS
  Administered 2014-03-22: 50 ug/min via INTRAVENOUS
  Administered 2014-03-23: 30 ug/min via INTRAVENOUS
  Filled 2014-03-20 (×14): qty 1

## 2014-03-20 NOTE — Consult Note (Addendum)
WOC wound consult note Reason for Consult: Consult requested for several wounds.  Pt states she was not aware she had any open areas. Wound type: Left heel Suspected deep tissue injury  (sDTI); dark purple intact fluid filled blister.  No open wound or drainage.  4X4cm with erythremia surrounding. Right arm with full thickness abrasion; 7X3X.1cm; 100% beefy red and moist; mod amt yellow drainage, no odor. Right buttock with unstageable wound; 8X3cm to upper area 100% slough, tightly adhered, then below this there is a patchy area of unstageable wounds which are scattered in multiple places in an irregular shape; 5X3cm, and another near this is  sDTI: 2X1cm to upper buttock. Wounds are of unknown etiology but shape and appearance are consistent with laying on an object for a prolonged period of time. Pressure Ulcer POA: Yes Dressing procedure/placement/frequency: Santyl to buttock wounds for chemical debridement of nonviable tissue.  Prevalon boot to reduce pressure to left heel.  Pt is on a Sport low airloss bed to reduce pressure.  Foam dressing to absorb drainage and promote healing to right arm. No family present at this time and pt does not appear to understand when plan of care discussed. Please re-consult if further assistance is needed.  Thank-you,  Cammie Mcgee MSN, RN, CWOCN, [place], CNS [phone]

## 2014-03-20 NOTE — Progress Notes (Addendum)
Patient Name: Robin Schroeder Date of Encounter: 03/20/2014  Active Problems:   Sepsis   DM2 (diabetes mellitus, type 2)   Physical deconditioning   Hypoalbuminemia   Hyponatremia   Elevated troponin   Length of Stay: 1  SUBJECTIVE  The patient denies any chest pain or SOB> She is laying flat in bed.  CURRENT MEDS . antiseptic oral rinse  7 mL Mouth Rinse q12n4p  . aztreonam  2 g Intravenous Q8H  . chlorhexidine  15 mL Mouth Rinse BID  . hydrocortisone sod succinate (SOLU-CORTEF) inj  100 mg Intravenous Q8H  . insulin aspart  0-9 Units Subcutaneous TID WC  . potassium chloride  10 mEq Intravenous Q1 Hr x 4  . sodium chloride  3 mL Intravenous Q12H  . vancomycin  1,250 mg Intravenous Q12H   . sodium chloride 125 mL/hr at 03/20/14 0600     OBJECTIVE  Filed Vitals:   03/20/14 0515 03/20/14 0530 03/20/14 0600 03/20/14 0700  BP:      Pulse: 107 102 102 102  Temp:      TempSrc:      Resp: 28 32 38 31  Height:      Weight:      SpO2: 95% 96% 95% 96%    Intake/Output Summary (Last 24 hours) at 03/20/14 0845 Last data filed at 03/20/14 0600  Gross per 24 hour  Intake 7967.5 ml  Output    600 ml  Net 7367.5 ml   Filed Weights   03/19/14 1550  Weight: 215 lb (97.523 kg)    PHYSICAL EXAM  General: Pleasant, NAD. Neuro: Alert and oriented X 3. Moves all extremities spontaneously. Psych: Normal affect. HEENT:  Normal  Neck: Supple without bruits or JVD. Lungs:  Resp regular and unlabored, CTA. Heart: RRR no s3, s4, or murmurs. Abdomen: Soft, non-tender, non-distended, BS + x 4.  Extremities: No clubbing, cyanosis or edema. DP/PT/Radials 2+ and equal bilaterally.  Accessory Clinical Findings  CBC  Recent Labs  03/19/14 1508  03/20/14 0108 03/20/14 0820  WBC 4.2  < > 3.3* 4.0  NEUTROABS 3.8  --   --   --   HGB 7.9*  < > 7.0* 8.8*  HCT 24.7*  < > 21.9* 27.1*  MCV 84.3  < > 85.9 83.9  PLT 77*  < > 48* 63*  < > = values in this interval not  displayed. Basic Metabolic Panel  Recent Labs  03/19/14 1508 03/19/14 1944 03/20/14 0108  NA 121*  --  129*  K 3.8  --  3.2*  CL 82*  --  98  CO2 28  --  22  GLUCOSE 221*  --  150*  BUN 24*  --  17  CREATININE 0.37* 0.32* 0.29*  CALCIUM 7.8*  --  6.5*   Liver Function Tests  Recent Labs  03/19/14 1508 03/20/14 0108  AST 46* 60*  ALT 12 11  ALKPHOS 219* 157*  BILITOT 1.6* 1.0  PROT 6.6 4.9*  ALBUMIN 1.2* 0.9*   No results for input(s): LIPASE, AMYLASE in the last 72 hours. Cardiac Enzymes  Recent Labs  03/19/14 1944 03/20/14 0108  TROPONINI 0.82* 7.66*    Recent Labs  03/19/14 1944  HGBA1C 7.0*   Radiology/Studies  Dg Chest Port 1 View  (if Code Sepsis Called)  03/19/2014   CLINICAL DATA:  Failure to thrive  EXAM: PORTABLE CHEST - 1 VIEW  COMPARISON:  01/27/2014  FINDINGS: Patient is rotated considerably to the  left and the study was not repeated. Study is significantly limited. Lungs are grossly clear.  IMPRESSION: Recommend repeat study. The patient is rotated considerably. Lungs are grossly clear.   Electronically Signed   By: Marlan Palau M.D.   On: 03/19/2014 14:51    TELE: SR to sinus tachycardia  ECG:SR, 0.5 STE in III and aVF, present on ECG on 10/13 and 10/14, unchanged     ASSESSMENT AND PLAN  Active Problems:  Sepsis  58 year old female with multiple medical problems including HTN, obesity, DM and chronic diastolic CHF who was discharged from the hospital in October after she was treated for MSSA bacteremia with sepsis who was forced to come to the hospital today by her husband for evaluation of infected decubitus ulcers. Cardiology was consulted for a mildly elevated POC troponin and some STE on her ECGs.   ECG changes and mild elevated troponin- POC troponin 0.16 --> 7.6, ECG is unchanged from 10/14, minimal STE were present then. The patient is asymptomatic, this is a very difficult situation, but the patient is poor cath candidate  considering ongoing sepsis, anemia and thrombocytopenia. We will proceed with medical treatment for now. I would recommend to start Heparin drip (if ok with the primary team) until troponin is downtrending and start carvedilol for sinus tachycardia and CAD. Start atorvastatin, follow LFTs in 3 weeks.   Replace potassium.   Signed, Lars Masson MD, [facility] 03/20/2014  11:30 am   The caes was discussed with primary physician Dr Marcellus Scott. The most recent TRoponin is > 20, the patient is still asymptomatic with unchanged ECG. Because of severe thrombocytopenia and anemia she is unable to get aspirin and heparin. We will follow closely, continue to monitor Troponin and ECG> We will follow echo report. Her husband wants to continue full aggressive management.  Lars Masson 03/20/2014

## 2014-03-20 NOTE — Progress Notes (Signed)
INITIAL NUTRITION ASSESSMENT  DOCUMENTATION CODES Per approved criteria  -Severe malnutrition in the context of chronic illness  Pt meets criteria for severe MALNUTRITION in the context of chronic illness as evidenced by PO intake < 50% for > one month, severe fluid accumulation.   INTERVENTION: -Recommend Glucerna Shake po TID, each supplement provides 220 kcal and 10 grams of protein -RD to continue to monitor  NUTRITION DIAGNOSIS: Inadequate oral intake related to decreased appetite/refusal of meals as evidenced by PO intake < 75% > one month.   Goal: Pt to meet >/= 90% of their estimated nutrition needs    Monitor:  Total protein/energy intake, labs, weights, supplement acceptance, skin integrity  Reason for Assessment: Braden Score < 12, MST  58 y.o. female  Admitting Dx: <principal problem not specified>  ASSESSMENT: 58 year old female with multiple medical problems who was discharged from the hospital in October after she was treated for MSSA bacteremia with sepsis.  As per patient's husband, patient did not want to eat or drink and she is not eating and drinking for past 5 days and so he got concerned that she wants to hurt herself. So he filled out IVC and patient was brought by the EMS.  -Pt evaluated by RD during previous admit for poor PO intake. Pt consuming 0-25% of meals.Glucerna shakes TID were ordered -Per discussion with pt's NT, pt continues with minimal intake. Consume 0% at breakfast, and < 10% of lunch tray. Was resting during time of RD assessment -Pt has been asking for liquids and would likely consume Glucerna shakes per NT -WOC evaluated pt on 12/04 for multiple decub ulcers -Previous medical records indicate pt with +7 lb since 10/20 admit; however pt with +7 L fluid balance and +4 generalized edema; which is likely affecting weight  Height: Ht Readings from Last 1 Encounters:  03/19/14 5\' 4"  (1.626 m)    Weight: Wt Readings from Last 1  Encounters:  03/19/14 215 lb (97.523 kg)    Ideal Body Weight: 120 lb  % Ideal Body Weight: 179%  Wt Readings from Last 10 Encounters:  03/19/14 215 lb (97.523 kg)  02/03/14 208 lb 5.4 oz (94.5 kg)    Usual Body Weight: unable to determine  % Usual Body Weight: unable to determine  BMI:  Body mass index is 36.89 kg/(m^2).  Estimated Nutritional Needs: Kcal: 1800-2000 Protein: 100-110 gram Fluid: per MD  Skin: stg 2 pressure ulcer on heel and arm, deep tissue injury on heel  Diet Order: Diet heart healthy/carb modified  EDUCATION NEEDS: -No education needs identified at this time   Intake/Output Summary (Last 24 hours) at 03/20/14 1357 Last data filed at 03/20/14 1100  Gross per 24 hour  Intake 8757.5 ml  Output    825 ml  Net 7932.5 ml    Last BM: PTA   Labs:   Recent Labs Lab 03/19/14 1508 03/19/14 1944 03/20/14 0108 03/20/14 0820  NA 121*  --  129*  --   K 3.8  --  3.2*  --   CL 82*  --  98  --   CO2 28  --  22  --   BUN 24*  --  17  --   CREATININE 0.37* 0.32* 0.29*  --   CALCIUM 7.8*  --  6.5*  --   MG  --   --   --  1.6  GLUCOSE 221*  --  150*  --     CBG (last 3)   Recent  Labs  03/20/14 0809 03/20/14 1158  GLUCAP 102* 154*    Scheduled Meds: . antiseptic oral rinse  7 mL Mouth Rinse q12n4p  . atorvastatin  20 mg Oral q1800  . carvedilol  3.125 mg Oral BID WC  . chlorhexidine  15 mL Mouth Rinse BID  . collagenase   Topical Daily  . feeding supplement (GLUCERNA SHAKE)  237 mL Oral TID BM  . hydrocortisone sod succinate (SOLU-CORTEF) inj  100 mg Intravenous Q8H  . insulin aspart  0-9 Units Subcutaneous TID WC  . sodium chloride  3 mL Intravenous Q12H  . vancomycin  1,250 mg Intravenous Q12H    Continuous Infusions: . sodium chloride 125 mL/hr at 03/20/14 0900    Past Medical History  Diagnosis Date  . Hypertension   . Diabetes mellitus without complication   . Arthritis   . Coronary artery disease     pt unaware     Past Surgical History  Procedure Laterality Date  . Tee without cardioversion N/A 02/02/2014    Procedure: TRANSESOPHAGEAL ECHOCARDIOGRAM (TEE);  Surgeon: Donato Schultz, MD;  Location: [facility] ENDOSCOPY;  Service: Cardiovascular;  Laterality: N/A;    Lloyd Huger MS RD LDN Clinical Dietitian [phone]

## 2014-03-20 NOTE — Progress Notes (Signed)
CRITICAL VALUE ALERT  Critical value received:  Trop >20, blood culture gram + cocci in clusters   Date of notification:  11/4  Time of notification:  11:00  Critical value read back:Yes.    Nurse who received alert:  Gerlean Ren, RN  MD notified (1st page):  Dr, Waymon Amato  Time of first page:  11:00  MD notified (2nd page):  Time of second page:  Responding MD:  Dr. Waymon Amato  Time MD responded:  11:04

## 2014-03-20 NOTE — Consult Note (Signed)
PULMONARY / CRITICAL CARE MEDICINE   Name: Robin Schroeder MRN: [id number] DOB: 06-30-1955    ADMISSION DATE:  03/19/2014 CONSULTATION DATE:  03/20/14  REFERRING MD :  Dr. Waymon Amato   CHIEF COMPLAINT:  Sepsis   INITIAL PRESENTATION: 58 y/o F with multiple medical problems and recent d/c in 01/2014 for MSSA bacteremia & sepsis admitted on 12/3 for decreased intake, concerns of intentional self harm and decubitus ulcers.  Persistently hypotensive and PCCM consulted for evaluation.   STUDIES:  12/04  ECHO >>    SIGNIFICANT EVENTS: 10/13-10/21/15  Admit with MSSA bacteremia.  NEG TEE for vegetations.  Work up + for RA.   10/30  Completed IV Cefazolin, PICC line d/c'd  11/02  Followed up with Dr. Drue Second, rx'd with 5mg  QD pred for RA sx  12/03  Admit with hypotension, decreased intake, concerns of intentional self harm (IVC'd by husband on admit) and decubitus ulcers. 12/04  Persistent hypotension, Stress steroids initiated and PCCM consulted for evaluation   HISTORY OF PRESENT ILLNESS:  58 y/o F with multiple medical problems to include HTN, DM, CAD, and recent admission from 10/13-10/21 for MSSA bacteremia.  TEE was negative for vegetations at that time.  Other notable work up includes findings positive for rheumatoid arthritis (Sed rate >100, ana 1:160, CCPA > 300, RF>600).  She completed IV Cefazolin via PICC line at a SNF on 10/30 and PICC line was discontinued.  The patient followed up with Dr. 11/30 on 11/02 and complained of significant knee pain that limited her ability to complete PT.  Notes reflect severe deconditioning at that office visit.  She was treated with 5 mg of prednisone daily and set up with Rheumatology.    The patient was ultimately discharged home with home health care.  After discharge, she reportedly has not been ambulatory at home.  Prior chart review reflects a poor medical literacy.  The patient's husband brought her back to the ER on 12/4 with a 5 day history of  not eating or drinking and was concerned she was intentionally trying to harm herself. He reportedly completed IVC paperwork on admission.    Initial work up was notable for fever of 103.2 & hypotension.  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  Her husband reported she had not been taking her prednisone for the past 5 days.  Lactic acid 1.6 on 12/4.  She was persistently hypotensive on 12/5 and PCCM consulted for evaluation. Blood cultures preliminarily positive for GPC's in clusters.   PAST MEDICAL HISTORY :   has a past medical history of Hypertension; Diabetes mellitus without complication; Arthritis; and Coronary artery disease.  has past surgical history that includes TEE without cardioversion (N/A, 02/02/2014).   HOME MEDICATIONS:  Prior to Admission medications   Medication Sig Start Date End Date Taking? Authorizing Provider  calcium carbonate (OS-CAL) 600 MG TABS tablet Take 600 mg by mouth 2 (two) times daily with a meal.   Yes Historical Provider, MD  Cholecalciferol (VITAMIN D3) 5000 UNITS TABS Take 1 tablet by mouth daily.   Yes Historical Provider, MD  furosemide (LASIX) 40 MG tablet Take 1 tablet (40 mg total) by mouth daily. 02/04/14  Yes 02/06/14, MD  oxyCODONE (OXY IR/ROXICODONE) 5 MG immediate release tablet Take 1 tablet (5 mg total) by mouth every 4 (four) hours as needed for moderate pain. 02/04/14  Yes 02/06/14, MD  predniSONE (DELTASONE) 10 MG tablet Take 10 mg by mouth daily.   Yes Historical Provider, MD  polyethylene glycol (MIRALAX / GLYCOLAX) packet Take 17 g by mouth daily as needed for moderate constipation. Patient not taking: Reported on 03/19/2014 02/04/14   Zannie Cove, MD   Allergies  Allergen Reactions  . Penicillins Rash    FAMILY HISTORY:  indicated that her mother is deceased. She indicated that her father is deceased. She indicated that both of her sisters are alive. She indicated that her brother is alive. She indicated that her  maternal grandmother is deceased. She indicated that her maternal grandfather is deceased. She indicated that her paternal grandmother is deceased. She indicated that her paternal grandfather is deceased.    SOCIAL HISTORY:  reports that she has never smoked. She does not have any smokeless tobacco history on file. She reports that she does not drink alcohol or use illicit drugs.  REVIEW OF SYSTEMS:  Unable , confused at times  SUBJECTIVE:  Shock, neo  VITAL SIGNS: Temp:  [99.2 F (37.3 C)-101.6 F (38.7 C)] 99.2 F (37.3 C) (12/04 1200) Pulse Rate:  [81-109] 81 (12/04 1500) Resp:  [16-38] 16 (12/04 1500) BP: (78-112)/(41-57) 98/43 mmHg (12/04 1200) SpO2:  [94 %-100 %] 97 % (12/04 1500) Weight:  [215 lb (97.523 kg)] 215 lb (97.523 kg) (12/03 1550)   HEMODYNAMICS:     VENTILATOR SETTINGS:     INTAKE / OUTPUT:  Intake/Output Summary (Last 24 hours) at 03/20/14 1540 Last data filed at 03/20/14 1500  Gross per 24 hour  Intake 9507.5 ml  Output   1070 ml  Net 8437.5 ml    PHYSICAL EXAMINATION: General:  No distress, depressed affect Neuro:  nonfocal uppers, perrl HEENT:  Obese, no jvd Cardiovascular:  s1 s2 RRR, distant Lungs:  Coarse, cough clear Abdomen:  Echymosis, no r/g, soft, nontener Musculoskeletal:  Back no erythema cerv to lumbar Skin:  brusing inner thighs, knees bilateral, deformed rt leg laterally  LABS:  CBC  Recent Labs Lab 03/19/14 1944 03/20/14 0108 03/20/14 0820  WBC 4.3 3.3* 4.0  HGB 7.1* 7.0* 8.8*  HCT 22.0* 21.9* 27.1*  PLT 52* 48* 63*   Coag's  Recent Labs Lab 03/19/14 1508  INR 1.29   BMET  Recent Labs Lab 03/19/14 1508 03/19/14 1944 03/20/14 0108  NA 121*  --  129*  K 3.8  --  3.2*  CL 82*  --  98  CO2 28  --  22  BUN 24*  --  17  CREATININE 0.37* 0.32* 0.29*  GLUCOSE 221*  --  150*   Electrolytes  Recent Labs Lab 03/19/14 1508 03/20/14 0108 03/20/14 0820  CALCIUM 7.8* 6.5*  --   MG  --   --  1.6   Sepsis  Markers  Recent Labs Lab 03/19/14 1542 03/19/14 1719 03/20/14 0820  LATICACIDVEN 2.52* 1.85 1.6   ABG  Recent Labs Lab 03/19/14 1523  PHART 7.484*  PCO2ART 40.8  PO2ART 78.7*   Liver Enzymes  Recent Labs Lab 03/19/14 1508 03/20/14 0108  AST 46* 60*  ALT 12 11  ALKPHOS 219* 157*  BILITOT 1.6* 1.0  ALBUMIN 1.2* 0.9*   Cardiac Enzymes  Recent Labs Lab 03/19/14 1944 03/20/14 0108 03/20/14 0820  TROPONINI 0.82* 7.66* >20.00*   Glucose  Recent Labs Lab 03/20/14 0809 03/20/14 1158  GLUCAP 102* 154*    Imaging Dg Chest Port 1 View  (if Code Sepsis Called)  03/19/2014   CLINICAL DATA:  Failure to thrive  EXAM: PORTABLE CHEST - 1 VIEW  COMPARISON:  01/27/2014  FINDINGS: Patient is  rotated considerably to the left and the study was not repeated. Study is significantly limited. Lungs are grossly clear.  IMPRESSION: Recommend repeat study. The patient is rotated considerably. Lungs are grossly clear.   Electronically Signed   By: Marlan Palau M.D.   On: 03/19/2014 14:51     ASSESSMENT / PLAN:  PULMONARY OETT n/a A: Mild Pulmonary Edema - noted on CXR, small effusion P:   Oxygen to support sats > 92% Pulmonary hygiene:  IS, Mobilize Over 7 liters up, refusing cvp line, kvo soon pcxr in am for volume status  CARDIOVASCULAR CVL n/a A:  Severe Sepsis / Septic Shock - in setting of probable bacteremia, GPC's in clusters on prelim BC's +/- component of adrenal insufficiency  Acute Coronary Syndrome - troponin > 20, EKG without change (minimal STE) HLD P:  Cardiology following Stress dose steroids adjust to 50 q6h Add florinef oral Trend EKG Unable to receive heparin / ASA due to thrombocytopenia / anemia Assess ECHO for vegetation / myocarditis   Neosynephrine for MAP > 60 Lipitor   I gained consent from husband for line, but she refuses and refuses to stay still putting er at risk complication,i do not want to sedate for this line: risk outwt benefit  now Consider picc See ID  RENAL A:   Hyponatremia  Hypochloremia  hypoK AI P:   NS @ 125 ml /hr, reduce to 50 as pcxr with edema and now 8 liters positive Echo Chem in am  Unable to assess cvp  GASTROINTESTINAL A:   Protein Calorie Malnutrition  Nausea  P:   Nutrition consult  Ensure TID  Diet as tolerated  PRN zofran  HEMATOLOGIC A:   Severe Thrombocytopenia - sepsis / bacteremia Anemia P:  Trend CBC Tx for Hgb <8% (ACS) or active bleeding   INFECTIOUS A:   Septic Shock - GPC's in clusters on prelim BC's  - source unclear, r/o bacteremia from endocarditis, decub (unlikely), joint involvement seeding? Decubitus Ulcers - sacral P:   BCx2 12/03 >> GPC's in clusters >> UC 12/03 >>  Abx: Vanco, start date 12/3, day 2/x WOC consult  ID consult Will xray knee bilateral, ankle done, may need mri spine, although clinically unimpressive Echo important for veg now, may need TEE if neg TTE May need CT abdo/pelvis for seeding abscess ( clinically not supported)  ENDOCRINE A:   Hyperglycemia  Adrenal Insufficiency  P:   Stress steroids  SSI Add florinef  NEUROLOGIC A:   Chronic Pain Rheumatoid Arthritis  P:   Minimize narcotics as able Baseline 5mg  prednisone (started 02/16/14) - to stress roids Xray joints  FAMILY  - Updates: patient  - Inter-disciplinary family meet or Palliative Care meeting due by: 12/11   Canary Brim, NP-C [facility] Pgr: [phone] or [phone]   03/20/2014, 3:40 PM   STAFF NOTE: Cindi Carbon, MD FACP have personally reviewed patient's available data, including medical history, events of note, physical examination and test results as part of my evaluation. I have discussed with resident/NP and other care providers such as pharmacist, RN and RRT. In addition, I personally evaluated patient and elicited key findings of: bacteremia, r/o seeding, depressed affect, she has refused line placement, ID on  case for vanc, xray knees, add florinef, neo to map goal 60, echo needed and important, no picc with acute bacteremia at this stage, stress roids, getting overloaded, reduce fluids The patient is critically ill with multiple organ systems failure and requires  high complexity decision making for assessment and support, frequent evaluation and titration of therapies, application of advanced monitoring technologies and extensive interpretation of multiple databases.   Critical Care Time devoted to patient care services described in this note is 35 Minutes. This time reflects time of care of this signee: Rory Percy, MD FACP. This critical care time does not reflect procedure time, or teaching time or supervisory time of PA/NP/Med student/Med Resident etc but could involve care discussion time. Rest per NP/medical resident whose note is outlined above and that I agree with   Mcarthur Rossetti. Tyson Alias, MD, FACP Pgr: [phone] [facility] 03/20/2014 4:49 PM

## 2014-03-20 NOTE — Progress Notes (Addendum)
RN paged critical troponin of 7.66. Troponin was elevated on admission attributed to "demand ischemia" by cardiology. Cardiology consult stated pt is not a candidate for invasive procedures and suggest medical mngt. Pt's BP would not hold with addition of BB. Cardio suggested aspirin therapy but pt's platelets are 54, 000 and will therefore, avoid aspirin therapy at this time. Spoke with RN and pt has not c/o chest pain since shift began. BP has been soft with MAP between 48-62 after several boluses. Elink doc is also watching pt and has ordered an Aline placed to verify accuracy of BP. This NP saw this pt earlier in shift and she is very frail and acutely ill with multiple chronic medical issues. Bedbound and incontinent at home. If pressors become necessary will call husband first. Will follow. Jimmye Norman, NP [facility] Update: Pt's Hgb 7. Called by Dr. Darrick Penna in [place] [facility]. Suggested tranfusion of 1 unit PRBCs given pt's cardiac hx and low BP. Order placed. CBC after transfusion. Goal of Hgb 8 given hx.  KJKG, NP

## 2014-03-20 NOTE — Progress Notes (Addendum)
PROGRESS NOTE    Robin Schroeder KDT:[id number] DOB: [birth year] DOA: 03/19/2014 PCP: No PCP Per Patient  HPI/Brief narrative 58 year old female patient with history of hypertension, obesity, DM, chronic diastolic CHF, possible rheumatoid arthritis, recent hospitalization for MSSA bacteremia of unknown source, went to SNF and then discharge from their to home, presented with complaints of not eating or drinking 4 days and husband was concerned that patient was trying to hurt herself and she was IVC and brought to ED where she was assessed as sepsis of unclear source and admitted to stepdown for further management. She has elevated troponins and cardiology is following. Infectious disease was consulted.   Assessment/Plan:  1. Severe sepsis: Unclear source.? UTI. Continue IV vancomycin and aztreonam per pharmacy. Continue IV fluids. Added sepsis dose IV hydrocortisone due to history of being on prednisone. Follow-up outstanding culture results. Infectious disease consulted and input pending. Sacral decubitus ulcer does not look grossly infected. 2. ? UTI: Continue IV aztreonam. 3. Dehydration with hyponatremia: Improved. Continue IV normal saline hydration. 4. Elevated troponin/demand ischemia/?STEMI: Patient has increasing troponin in the absence of chest pain. Cardiology consultation and follow-up appreciated. Poor candidate for aggressive intervention secondary to ongoing sepsis, anemia and thrombocytopenia. Cardiology recommends medical treatment. Cardiology also recommended starting heparin but patient is thrombocytopenic and has some bleeding as evidenced by right abdominal wall patchy ecchymosis and from IV sites as per nursing. Hence will not start aspirin at this time. Agree with statins. Beta blockers when blood pressure allows. 5. IVC/?? Intention to self harm: Patient however denies. Continue Recruitment consultant for now. Will need to reassess. 6. Anasarca: Secondary to ongoing illnesses and  hypo-albuminemia. 7. Right sacral decubitus: Does not appear grossly infected. Wound care consultation requested. 8. Hypertension: Patient was hypotensive this morning. Hold antihypertensives. 9. Type II DM: SSI. Started clear liquid diet and advance diet as tolerated. 10. Possible rheumatoid arthritis: Patient was on prednisone at home. We'll start IV hydrocortisone 100 mg 3 times a day.  11. Chronic diastolic CHF: Seems compensated. 12. Hypokalemia: Replace and follow 13. Anemia: Acute on chronic Unclear etiology. Baseline hemoglobin probably in the 9 g per DL range. Status post 1 unit PRBC with appropriate response. Follow CBC in a.m. 14. Thrombocytopenia: Possibly from acute illness/sepsis. Slightly better compared to last night. Follow CBCs. Avoid heparin/aspirin products.   Code Status: Full Family Communication: discussed with spouse. Disposition Plan: ? SNF when medically stable   Consultants:  Cardiology  ID  Procedures:  None  Antibiotics:  IV Vanc 12/3>  IV Aztreonam 12/3>   Subjective: Says feels better and denies complaints. Denies pain (but tender all over to touch), cough, dyspnea. Asking for something to eat and drink.   Objective: Filed Vitals:   03/20/14 0515 03/20/14 0530 03/20/14 0600 03/20/14 0700  BP:      Pulse: 107 102 102 102  Temp:      TempSrc:      Resp: 28 32 38 31  Height:      Weight:      SpO2: 95% 96% 95% 96%   MAXIMUM TEMPERATURE 103.2, blood pressure was in the systolic low 100s.  Intake/Output Summary (Last 24 hours) at 03/20/14 0912 Last data filed at 03/20/14 0600  Gross per 24 hour  Intake 7967.5 ml  Output    600 ml  Net 7367.5 ml   Filed Weights   03/19/14 1550  Weight: 97.523 kg (215 lb)     Exam:  General exam: Pleasant middle-aged female, moderately  built and obese lying comfortably supine in bed. Respiratory system: Slightly diminished breath sounds in the bases but otherwise clear to auscultation. No  increased work of breathing. Cardiovascular system: S1 & S2 heard, RRR. No JVD, murmurs, gallops, clicks. Anasarca. Gastrointestinal system: Abdomen is nondistended, soft and nontender. Normal bowel sounds heard. Patient has patchy ecchymosis over right upper quadrant. Central nervous system: Alert and oriented 2. No focal neurological deficits. Extremities: Symmetric 5 x 5 power. Skin: Dry appearing right sacral decubitus without signs of overt infection. Patient has skin tear over the right posterior upper arm.   Data Reviewed: Basic Metabolic Panel:  Recent Labs Lab 03/19/14 1508 03/19/14 1944 03/20/14 0108 03/20/14 0820  NA 121*  --  129*  --   K 3.8  --  3.2*  --   CL 82*  --  98  --   CO2 28  --  22  --   GLUCOSE 221*  --  150*  --   BUN 24*  --  17  --   CREATININE 0.37* 0.32* 0.29*  --   CALCIUM 7.8*  --  6.5*  --   MG  --   --   --  1.6   Liver Function Tests:  Recent Labs Lab 03/19/14 1508 03/20/14 0108  AST 46* 60*  ALT 12 11  ALKPHOS 219* 157*  BILITOT 1.6* 1.0  PROT 6.6 4.9*  ALBUMIN 1.2* 0.9*   No results for input(s): LIPASE, AMYLASE in the last 168 hours. No results for input(s): AMMONIA in the last 168 hours. CBC:  Recent Labs Lab 03/19/14 1508 03/19/14 1944 03/20/14 0108 03/20/14 0820  WBC 4.2 4.3 3.3* 4.0  NEUTROABS 3.8  --   --   --   HGB 7.9* 7.1* 7.0* 8.8*  HCT 24.7* 22.0* 21.9* 27.1*  MCV 84.3 85.6 85.9 83.9  PLT 77* 52* 48* 63*   Cardiac Enzymes:  Recent Labs Lab 03/19/14 1944 03/20/14 0108  TROPONINI 0.82* 7.66*   BNP (last 3 results) No results for input(s): PROBNP in the last 8760 hours. CBG:  Recent Labs Lab 03/20/14 0809  GLUCAP 102*    No results found for this or any previous visit (from the past 240 hour(s)).       Studies: Dg Chest Port 1 View  (if Code Sepsis Called)  03/19/2014   CLINICAL DATA:  Failure to thrive  EXAM: PORTABLE CHEST - 1 VIEW  COMPARISON:  01/27/2014  FINDINGS: Patient is rotated  considerably to the left and the study was not repeated. Study is significantly limited. Lungs are grossly clear.  IMPRESSION: Recommend repeat study. The patient is rotated considerably. Lungs are grossly clear.   Electronically Signed   By: Marlan Palau M.D.   On: 03/19/2014 14:51        Scheduled Meds: . antiseptic oral rinse  7 mL Mouth Rinse q12n4p  . atorvastatin  20 mg Oral q1800  . aztreonam  2 g Intravenous Q8H  . carvedilol  3.125 mg Oral BID WC  . chlorhexidine  15 mL Mouth Rinse BID  . hydrocortisone sod succinate (SOLU-CORTEF) inj  100 mg Intravenous Q8H  . insulin aspart  0-9 Units Subcutaneous TID WC  . potassium chloride  10 mEq Intravenous Q1 Hr x 4  . potassium chloride  40 mEq Oral Once  . sodium chloride  3 mL Intravenous Q12H  . vancomycin  1,250 mg Intravenous Q12H   Continuous Infusions: . sodium chloride 125 mL/hr at 03/20/14 0600  Active Problems:   Sepsis   DM2 (diabetes mellitus, type 2)   Physical deconditioning   Hypoalbuminemia   Hyponatremia   Elevated troponin    Time spent: 60 minutes.    Marcellus Scott, MD, FACP, FHM. [facility] Pager [phone]  If 7PM-7AM, please contact night-coverage [email or web address] Password [email or web address] 03/20/2014, 9:12 AM    LOS: 1 day

## 2014-03-20 NOTE — Consult Note (Signed)
[facility] for Infectious Disease    Date of Admission:  03/19/2014  Date of Consult:  03/20/2014  Reason for Consult: Septic shock from recurrent staphylococcus aureus bacteremia Referring Physician: Dr. Algis Liming   HPI: Robin Schroeder is an 58 y.o. female with DM, failure to thrive admitted in October with SAB, sp TEE without vegetations, given IV vancomycin due to PCN allergy for 2 weeks in SNF because no other source was ID'd. She was seen in followup in ID clinic.   She has had several Rheum antibodies positive including RF, ANA, CCPA.    Apparently she has largely been bed bound with spouse leaving her in diapers when he left for the day to work. Patient became progressively confused in past several days refusing to eat or drink. Husband filled out IVC and had pt brought to ED via EMS where she was found to be in septic shock.  Since admission her blood cultures are now positive for [facility].     Past Medical History  Diagnosis Date  . Hypertension   . Diabetes mellitus without complication   . Arthritis   . Coronary artery disease     pt unaware    Past Surgical History  Procedure Laterality Date  . Tee without cardioversion N/A 02/02/2014    Procedure: TRANSESOPHAGEAL ECHOCARDIOGRAM (TEE);  Surgeon: Candee Furbish, MD;  Location: [facility];  Service: Cardiovascular;  Laterality: N/A;  ergies:   Allergies  Allergen Reactions  . Penicillins Rash     Medications: I have reviewed patients current medications as documented in Epic Anti-infectives    Start     Dose/Rate Route Frequency Ordered Stop   03/20/14 1600  levofloxacin (LEVAQUIN) IVPB 750 mg  Status:  Discontinued     750 mg100 mL/hr over 90 Minutes Intravenous Every 24 hours 03/19/14 1629 03/19/14 1709   03/20/14 0000  vancomycin (VANCOCIN) 1,250 mg in sodium chloride 0.9 % 250 mL IVPB     1,250 mg166.7 mL/hr over 90 Minutes Intravenous Every 12 hours 03/19/14 1627     03/20/14 0000  aztreonam  (AZACTAM) 2 g in dextrose 5 % 50 mL IVPB  Status:  Discontinued     2 g100 mL/hr over 30 Minutes Intravenous Every 8 hours 03/19/14 1628 03/20/14 1147   03/19/14 1445  levofloxacin (LEVAQUIN) IVPB 750 mg     750 mg100 mL/hr over 90 Minutes Intravenous  Once 03/19/14 1435 03/19/14 1708   03/19/14 1445  aztreonam (AZACTAM) 2 g in dextrose 5 % 50 mL IVPB     2 g100 mL/hr over 30 Minutes Intravenous  Once 03/19/14 1435 03/19/14 1708   03/19/14 1445  vancomycin (VANCOCIN) IVPB 1000 mg/200 mL premix     1,000 mg200 mL/hr over 60 Minutes Intravenous  Once 03/19/14 1435 03/19/14 1707      Social History:  reports that she has never smoked. She does not have any smokeless tobacco history on file. She reports that she does not drink alcohol or use illicit drugs.  Family History  Problem Relation Age of Onset  . Hypertension Brother     As in HPI and primary teams notes otherwise 12 point review of systems is negative  Blood pressure 98/43, pulse 81, temperature 99.2 F (37.3 C), temperature source Oral, resp. rate 16, height 5' 4"  (1.626 m), weight 215 lb (97.523 kg), SpO2 97 %. General: very sleepy, pale, oriented to self HEENT:  EOMI, oropharynx clear and without exudate CVS regular rate, normal r,  no  murmur rubs or gallops Chest: clear to auscultation bilaterally, no wheezing, rales or rhonchi Abdomen: soft nontender, nondistended, normal bowel sounds, Extremities: sklin: multiple ecchymoses, petechiae. Worst at site of her decubitus ulcer which is large eschar appearing area. She also has blister on foot that burst and reveals ulceration  See pictures  Sacral decubitus ulcers with eschars, petechaie 03/20/14    Further more confluent petecheiae    Ulcer leg 03/20/14     Opposite leg ulcer 03/20/14   Ulcer on right foot 03/20/14     Ulcer: left foot heel where she had blister 03/20/14    Diffuse edema Tender all-over but esp in left calf and ankle  Neuro: nonfocal,  diffusely weak sensation intact   Results for orders placed or performed during the hospital encounter of 03/19/14 (from the past 48 hour(s))  I-Stat CG4 Lactic Acid, ED     Status: Abnormal   Collection Time: 03/19/14  2:24 PM  Result Value Ref Range   Lactic Acid, Venous 2.53 (H) 0.5 - 2.2 mmol/L  Urinalysis, Routine w reflex microscopic     Status: Abnormal   Collection Time: 03/19/14  3:04 PM  Result Value Ref Range   Color, Urine AMBER (A) YELLOW    Comment: BIOCHEMICALS MAY BE AFFECTED BY COLOR   APPearance CLOUDY (A) CLEAR   Specific Gravity, Urine 1.021 1.005 - 1.030   pH 5.0 5.0 - 8.0   Glucose, UA NEGATIVE NEGATIVE mg/dL   Hgb urine dipstick LARGE (A) NEGATIVE   Bilirubin Urine SMALL (A) NEGATIVE   Ketones, ur NEGATIVE NEGATIVE mg/dL   Protein, ur NEGATIVE NEGATIVE mg/dL   Urobilinogen, UA 2.0 (H) 0.0 - 1.0 mg/dL   Nitrite NEGATIVE NEGATIVE   Leukocytes, UA SMALL (A) NEGATIVE  Urine microscopic-add on     Status: Abnormal   Collection Time: 03/19/14  3:04 PM  Result Value Ref Range   Squamous Epithelial / LPF MANY (A) RARE   WBC, UA 3-6 <3 WBC/hpf   RBC / HPF 7-10 <3 RBC/hpf   Bacteria, UA MANY (A) RARE   Casts GRANULAR CAST (A) NEGATIVE   Urine-Other MUCOUS PRESENT     Comment: AMORPHOUS URATES/PHOSPHATES  CBC WITH DIFFERENTIAL     Status: Abnormal   Collection Time: 03/19/14  3:08 PM  Result Value Ref Range   WBC 4.2 4.0 - 10.5 K/uL   RBC 2.93 (L) 3.87 - 5.11 MIL/uL   Hemoglobin 7.9 (L) 12.0 - 15.0 g/dL   HCT 24.7 (L) 36.0 - 46.0 %   MCV 84.3 78.0 - 100.0 fL   MCH 27.0 26.0 - 34.0 pg   MCHC 32.0 30.0 - 36.0 g/dL   RDW 17.0 (H) 11.5 - 15.5 %   Platelets 77 (L) 150 - 400 K/uL    Comment: REPEATED TO VERIFY SPECIMEN CHECKED FOR CLOTS PLATELET COUNT CONFIRMED BY SMEAR    Neutrophils Relative % 89 (H) 43 - 77 %   Lymphocytes Relative 8 (L) 12 - 46 %   Monocytes Relative 3 3 - 12 %   Eosinophils Relative 0 0 - 5 %   Basophils Relative 0 0 - 1 %   Neutro  Abs 3.8 1.7 - 7.7 K/uL   Lymphs Abs 0.3 (L) 0.7 - 4.0 K/uL   Monocytes Absolute 0.1 0.1 - 1.0 K/uL   Eosinophils Absolute 0.0 0.0 - 0.7 K/uL   Basophils Absolute 0.0 0.0 - 0.1 K/uL   Smear Review MORPHOLOGY UNREMARKABLE   Comprehensive metabolic panel  Status: Abnormal   Collection Time: 03/19/14  3:08 PM  Result Value Ref Range   Sodium 121 (LL) 137 - 147 mEq/L    Comment: CRITICAL RESULT CALLED TO, READ BACK BY AND VERIFIED WITH: L JEFFRIE 1553 03/19/14 A NAVARRO    Potassium 3.8 3.7 - 5.3 mEq/L   Chloride 82 (L) 96 - 112 mEq/L   CO2 28 19 - 32 mEq/L   Glucose, Bld 221 (H) 70 - 99 mg/dL   BUN 24 (H) 6 - 23 mg/dL   Creatinine, Ser 0.37 (L) 0.50 - 1.10 mg/dL   Calcium 7.8 (L) 8.4 - 10.5 mg/dL   Total Protein 6.6 6.0 - 8.3 g/dL   Albumin 1.2 (L) 3.5 - 5.2 g/dL   AST 46 (H) 0 - 37 U/L   ALT 12 0 - 35 U/L   Alkaline Phosphatase 219 (H) 39 - 117 U/L   Total Bilirubin 1.6 (H) 0.3 - 1.2 mg/dL   GFR calc non Af Amer >90 >90 mL/min   GFR calc Af Amer >90 >90 mL/min    Comment: (NOTE) The eGFR has been calculated using the CKD EPI equation. This calculation has not been validated in all clinical situations. eGFR's persistently <90 mL/min signify possible Chronic Kidney Disease.    Anion gap 11 5 - 15  Protime-INR     Status: Abnormal   Collection Time: 03/19/14  3:08 PM  Result Value Ref Range   Prothrombin Time 16.2 (H) 11.6 - 15.2 seconds   INR 1.29 0.00 - 1.49  ABO/Rh     Status: None   Collection Time: 03/19/14  3:13 PM  Result Value Ref Range   ABO/RH(D) A POS   Blood Culture (routine x 2)     Status: None (Preliminary result)   Collection Time: 03/19/14  3:23 PM  Result Value Ref Range   Specimen Description BLOOD RIGHT WRIST    Special Requests BOTTLES DRAWN AEROBIC AND ANAEROBIC 5 ML    Culture  Setup Time      03/20/2014 07:48 Performed at [place] Note: Gram Stain Report Called to,Read Back By and  Verified With: FAYE S BY INGRAM A 03/20/14 11AM Performed at [facility]    Report Status PENDING   Blood Culture (routine x 2)     Status: None (Preliminary result)   Collection Time: 03/19/14  3:23 PM  Result Value Ref Range   Specimen Description BLOOD BLOOD LEFT FOREARM    Special Requests BOTTLES DRAWN AEROBIC AND ANAEROBIC 5 ML    Culture  Setup Time      03/20/2014 07:51 Performed at [place] Note: Gram Stain Report Called to,Read Back By and Verified With: FAYE S BY INGRAM A 03/20/14 11AM Performed at [facility]    Report Status PENDING   Blood gas, arterial ([facility] & [facility] ONLY)     Status: Abnormal   Collection Time: 03/19/14  3:23 PM  Result Value Ref Range   FIO2 0.21 %   pH, Arterial 7.484 (H) 7.350 - 7.450   pCO2 arterial 40.8 35.0 - 45.0 mmHg   pO2, Arterial 78.7 (L) 80.0 - 100.0 mmHg   Bicarbonate 29.4 (H) 20.0 - 24.0 mEq/L   TCO2 27.1 0 - 100 mmol/L   Acid-Base Excess 6.5 (H) 0.0 - 2.0 mmol/L   O2  Saturation 94.0 %   Patient temperature 103.5    Collection site LEFT RADIAL    Drawn by [id number]    Sample type ARTERIAL DRAW   Type and screen     Status: None (Preliminary result)   Collection Time: 03/19/14  3:23 PM  Result Value Ref Range   ABO/RH(D) A POS    Antibody Screen NEG    Sample Expiration 03/22/2014    Unit Number [id number]    Blood Component Type RED CELLS,LR    Unit division 00    Status of Unit ISSUED    Transfusion Status OK TO TRANSFUSE    Crossmatch Result Compatible   I-stat troponin, ED (not at [facility])     Status: Abnormal   Collection Time: 03/19/14  3:38 PM  Result Value Ref Range   Troponin i, poc 0.16 (HH) 0.00 - 0.08 ng/mL   Comment NOTIFIED PHYSICIAN    Comment 3            Comment: Due to the release kinetics of cTnI, a negative result within the first hours of the onset of symptoms does not rule out myocardial infarction with certainty. If myocardial  infarction is still suspected, repeat the test at appropriate intervals.   I-Stat CG4 Lactic Acid, ED     Status: Abnormal   Collection Time: 03/19/14  3:42 PM  Result Value Ref Range   Lactic Acid, Venous 2.52 (H) 0.5 - 2.2 mmol/L  I-Stat CG4 Lactic Acid, ED     Status: None   Collection Time: 03/19/14  5:19 PM  Result Value Ref Range   Lactic Acid, Venous 1.85 0.5 - 2.2 mmol/L  CBC     Status: Abnormal   Collection Time: 03/19/14  7:44 PM  Result Value Ref Range   WBC 4.3 4.0 - 10.5 K/uL   RBC 2.57 (L) 3.87 - 5.11 MIL/uL   Hemoglobin 7.1 (L) 12.0 - 15.0 g/dL   HCT 22.0 (L) 36.0 - 46.0 %   MCV 85.6 78.0 - 100.0 fL   MCH 27.6 26.0 - 34.0 pg   MCHC 32.3 30.0 - 36.0 g/dL   RDW 16.8 (H) 11.5 - 15.5 %   Platelets 52 (L) 150 - 400 K/uL    Comment: SPECIMEN CHECKED FOR CLOTS REPEATED TO VERIFY DELTA CHECK NOTED PLATELET COUNT CONFIRMED BY SMEAR   Creatinine, serum     Status: Abnormal   Collection Time: 03/19/14  7:44 PM  Result Value Ref Range   Creatinine, Ser 0.32 (L) 0.50 - 1.10 mg/dL   GFR calc non Af Amer >90 >90 mL/min   GFR calc Af Amer >90 >90 mL/min    Comment: (NOTE) The eGFR has been calculated using the CKD EPI equation. This calculation has not been validated in all clinical situations. eGFR's persistently <90 mL/min signify possible Chronic Kidney Disease.   Hemoglobin A1c     Status: Abnormal   Collection Time: 03/19/14  7:44 PM  Result Value Ref Range   Hgb A1c MFr Bld 7.0 (H) <5.7 %    Comment: (NOTE)                                                                       According to the ADA Clinical  Practice Recommendations for 2011, when HbA1c is used as a screening test:  >=6.5%   Diagnostic of Diabetes Mellitus           (if abnormal result is confirmed) 5.7-6.4%   Increased risk of developing Diabetes Mellitus References:Diagnosis and Classification of Diabetes Mellitus,Diabetes QPRF,1638,46(KZLDJ 1):S62-S69 and Standards of Medical Care in           Diabetes - 2011,Diabetes TTSV,7793,90 (Suppl 1):S11-S61.    Mean Plasma Glucose 154 (H) <117 mg/dL    Comment: Performed at [facility]  Troponin I (q 6hr x 3)     Status: Abnormal   Collection Time: 03/19/14  7:44 PM  Result Value Ref Range   Troponin I 0.82 (HH) <0.30 ng/mL    Comment:        Due to the release kinetics of cTnI, a negative result within the first hours of the onset of symptoms does not rule out myocardial infarction with certainty. If myocardial infarction is still suspected, repeat the test at appropriate intervals. CRITICAL RESULT CALLED TO, READ BACK BY AND VERIFIED WITHAdalberto Ill RN 2120 03/19/14 A NAVARRO   Comprehensive metabolic panel     Status: Abnormal   Collection Time: 03/20/14  1:08 AM  Result Value Ref Range   Sodium 129 (L) 137 - 147 mEq/L    Comment: DELTA CHECK NOTED REPEATED TO VERIFY    Potassium 3.2 (L) 3.7 - 5.3 mEq/L   Chloride 98 96 - 112 mEq/L    Comment: DELTA CHECK NOTED REPEATED TO VERIFY    CO2 22 19 - 32 mEq/L   Glucose, Bld 150 (H) 70 - 99 mg/dL   BUN 17 6 - 23 mg/dL   Creatinine, Ser 0.29 (L) 0.50 - 1.10 mg/dL   Calcium 6.5 (L) 8.4 - 10.5 mg/dL   Total Protein 4.9 (L) 6.0 - 8.3 g/dL   Albumin 0.9 (L) 3.5 - 5.2 g/dL   AST 60 (H) 0 - 37 U/L   ALT 11 0 - 35 U/L   Alkaline Phosphatase 157 (H) 39 - 117 U/L   Total Bilirubin 1.0 0.3 - 1.2 mg/dL   GFR calc non Af Amer >90 >90 mL/min   GFR calc Af Amer >90 >90 mL/min    Comment: (NOTE) The eGFR has been calculated using the CKD EPI equation. This calculation has not been validated in all clinical situations. eGFR's persistently <90 mL/min signify possible Chronic Kidney Disease.    Anion gap 9 5 - 15  CBC     Status: Abnormal   Collection Time: 03/20/14  1:08 AM  Result Value Ref Range   WBC 3.3 (L) 4.0 - 10.5 K/uL   RBC 2.55 (L) 3.87 - 5.11 MIL/uL   Hemoglobin 7.0 (L) 12.0 - 15.0 g/dL   HCT 21.9 (L) 36.0 - 46.0 %   MCV 85.9 78.0 - 100.0 fL   MCH 27.5  26.0 - 34.0 pg   MCHC 32.0 30.0 - 36.0 g/dL   RDW 16.8 (H) 11.5 - 15.5 %   Platelets 48 (L) 150 - 400 K/uL    Comment: CONSISTENT WITH PREVIOUS RESULT  Troponin I (q 6hr x 3)     Status: Abnormal   Collection Time: 03/20/14  1:08 AM  Result Value Ref Range   Troponin I 7.66 (HH) <0.30 ng/mL    Comment:        Due to the release kinetics of cTnI, a negative result within the first hours of the onset of symptoms  does not rule out myocardial infarction with certainty. If myocardial infarction is still suspected, repeat the test at appropriate intervals. CRITICAL RESULT CALLED TO, READ BACK BY AND VERIFIED WITH: [facility] RN AT 0156 12.04.15 BY TIBBITTS,K   Prepare RBC     Status: None   Collection Time: 03/20/14  3:00 AM  Result Value Ref Range   Order Confirmation ORDER PROCESSED BY BLOOD BANK   Glucose, capillary     Status: Abnormal   Collection Time: 03/20/14  8:09 AM  Result Value Ref Range   Glucose-Capillary 102 (H) 70 - 99 mg/dL  Troponin I (q 6hr x 3)     Status: Abnormal   Collection Time: 03/20/14  8:20 AM  Result Value Ref Range   Troponin I >20.00 (HH) <0.30 ng/mL    Comment:        Due to the release kinetics of cTnI, a negative result within the first hours of the onset of symptoms does not rule out myocardial infarction with certainty. If myocardial infarction is still suspected, repeat the test at appropriate intervals. CRITICAL RESULT CALLED TO, READ BACK BY AND VERIFIED WITH: SCHAFFER,S. RN AT 1540 03/20/14 MULLINS,T   CBC     Status: Abnormal   Collection Time: 03/20/14  8:20 AM  Result Value Ref Range   WBC 4.0 4.0 - 10.5 K/uL   RBC 3.23 (L) 3.87 - 5.11 MIL/uL   Hemoglobin 8.8 (L) 12.0 - 15.0 g/dL    Comment: REPEATED TO VERIFY DELTA CHECK NOTED POST TRANSFUSION SPECIMEN    HCT 27.1 (L) 36.0 - 46.0 %   MCV 83.9 78.0 - 100.0 fL   MCH 27.2 26.0 - 34.0 pg   MCHC 32.5 30.0 - 36.0 g/dL   RDW 16.4 (H) 11.5 - 15.5 %   Platelets 63 (L) 150 - 400 K/uL     Comment: REPEATED TO VERIFY DELTA CHECK NOTED RESULT CHECKED   Cortisol     Status: None   Collection Time: 03/20/14  8:20 AM  Result Value Ref Range   Cortisol, Plasma 31.5 ug/dL    Comment: (NOTE) AM:  4.3 - 22.4 ug/dL PM:  3.1 - 16.7 ug/dL Performed at [facility]   Lactic acid, plasma     Status: None   Collection Time: 03/20/14  8:20 AM  Result Value Ref Range   Lactic Acid, Venous 1.6 0.5 - 2.2 mmol/L  Magnesium     Status: None   Collection Time: 03/20/14  8:20 AM  Result Value Ref Range   Magnesium 1.6 1.5 - 2.5 mg/dL  Glucose, capillary     Status: Abnormal   Collection Time: 03/20/14 11:58 AM  Result Value Ref Range   Glucose-Capillary 154 (H) 70 - 99 mg/dL   Comment 1 Documented in Chart    Comment 2 Notify RN    @BRIEFLABTABLE (sdes,specrequest,cult,reptstatus)   ) Recent Results (from the past 720 hour(s))  Blood Culture (routine x 2)     Status: None (Preliminary result)   Collection Time: 03/19/14  3:23 PM  Result Value Ref Range Status   Specimen Description BLOOD RIGHT WRIST  Final   Special Requests BOTTLES DRAWN AEROBIC AND ANAEROBIC 5 ML  Final   Culture  Setup Time   Final    03/20/2014 07:48 Performed at [facility]    Culture   Final    GRAM POSITIVE COCCI IN [place] Note: Gram Stain Report Called to,Read Back By and Verified With: FAYE S BY INGRAM A 03/20/14 11AM  Performed at [facility]    Report Status PENDING  Incomplete  Blood Culture (routine x 2)     Status: None (Preliminary result)   Collection Time: 03/19/14  3:23 PM  Result Value Ref Range Status   Specimen Description BLOOD BLOOD LEFT FOREARM  Final   Special Requests BOTTLES DRAWN AEROBIC AND ANAEROBIC 5 ML  Final   Culture  Setup Time   Final    03/20/2014 07:51 Performed at [facility]    Culture   Final    GRAM POSITIVE COCCI IN [place] Note: Gram Stain Report Called to,Read Back By and Verified With: FAYE S BY INGRAM A 03/20/14  11AM Performed at [facility]    Report Status PENDING  Incomplete     Impression/Recommendation  Active Problems:   Sepsis   DM2 (diabetes mellitus, type 2)   Physical deconditioning   Hypoalbuminemia   Hyponatremia   Elevated troponin   Severe sepsis   Acute UTI   Dehydration with hyponatremia   Protein-calorie malnutrition, severe   Robin Schroeder is a 58 y.o. female with recurrent SAB failure to thrive , septic shock  #1 Recurrent Staphylococcus Aureus Bacteremia (organism not yet ID but very likely the case)  ---continue vancomycin and investigate if we could give her cephalosporin --repeat blood cultures --SHE WILL NEED ANOTHER TEE WHEN STABLE --would get complete plain films of both feet given ulceration and diabetes and pain in foot ankle --consider MRI left foot and ankle when stable to investigate for pyomyositis of calf, infection of ankle joint foot --consider MRI pelvis as well given large eschar on back --she is going to need a MUCH more protracted course of IV abx this time around  #2 DFU: more likely due to her exfoliating blisters and shock but will deploy Diabetic Foot focused orders for studies , consults that are feasible in her current condition  I spent greater than 40 minutes with the patient including greater than 50% of time in face to face counsel of the patient and in coordination of their care.    03/20/2014, 4:23 PM   Thank you so much for this interesting consult  [facility] [place] [phone] (pager) [phone] (office) 03/20/2014, 4:23 PM  Rhina [place] 03/20/2014, 4:23 PM

## 2014-03-20 NOTE — Plan of Care (Signed)
Problem: Consults Goal: General Medical Patient Education See Patient Education Module for specific education. Outcome: Completed/Met Date Met:  03/20/14  Problem: Phase I Progression Outcomes Goal: Pain controlled with appropriate interventions Outcome: Completed/Met Date Met:  03/20/14

## 2014-03-20 NOTE — Progress Notes (Signed)
Obtained verbal consent for [facility] placement from pt's spouse. Confirmed with 2nd RN. Notified RT for placement.

## 2014-03-20 NOTE — Progress Notes (Signed)
Echocardiogram 2D Echocardiogram has been performed.  Robin Schroeder 03/20/2014, 10:53 AM

## 2014-03-20 NOTE — Procedures (Signed)
Arterial Catheter Insertion Procedure Note Robin Schroeder [id number] [birth year]  Procedure: Insertion of Arterial Catheter  Indications: Blood pressure monitoring  Procedure Details Consent: per RN with family member Time Out: Verified patient identification, verified procedure, site/side was marked, verified correct patient position, special equipment/implants available, medications/allergies/relevent history reviewed, required imaging and test results available.  Performed  Maximum sterile technique was used including antiseptics, cap, gloves, gown, hand hygiene, mask and sheet. Skin prep: Chlorhexidine; local anesthetic administered 20 gauge catheter was inserted into left radial artery using the Seldinger technique.  Evaluation Blood flow good; BP tracing good. Complications: No apparent complications.   Carney Harder 03/20/2014

## 2014-03-20 NOTE — Progress Notes (Addendum)
CSW received referral that pt was signed out of a rehab facility AMA and her husband has been her 24 hour caregiver for the last month. Pt condition is severely deteriorated and spouse is unable to provide appropriate care by himself.  CSW reviewed chart and noted that psych consulted for intention to self harm however, pt denies at this time and capacity. CSW to await recommendations from psych consult.  Pt will need PT evaluation when pt stable to participate.   CSW to follow up on recommendations and complete full psychosocial assessment as appropriate.  Loletta Specter, MSW, LCSW Clinical Social Work [phone]

## 2014-03-21 ENCOUNTER — Inpatient Hospital Stay (HOSPITAL_COMMUNITY): Payer: Medicaid Other

## 2014-03-21 DIAGNOSIS — B9561 Methicillin susceptible Staphylococcus aureus infection as the cause of diseases classified elsewhere: Secondary | ICD-10-CM

## 2014-03-21 DIAGNOSIS — D696 Thrombocytopenia, unspecified: Secondary | ICD-10-CM

## 2014-03-21 DIAGNOSIS — L97409 Non-pressure chronic ulcer of unspecified heel and midfoot with unspecified severity: Secondary | ICD-10-CM | POA: Diagnosis present

## 2014-03-21 DIAGNOSIS — I2119 ST elevation (STEMI) myocardial infarction involving other coronary artery of inferior wall: Secondary | ICD-10-CM

## 2014-03-21 DIAGNOSIS — E08621 Diabetes mellitus due to underlying condition with foot ulcer: Secondary | ICD-10-CM | POA: Diagnosis present

## 2014-03-21 DIAGNOSIS — M86159 Other acute osteomyelitis, unspecified femur: Secondary | ICD-10-CM | POA: Insufficient documentation

## 2014-03-21 DIAGNOSIS — R6521 Severe sepsis with septic shock: Secondary | ICD-10-CM

## 2014-03-21 DIAGNOSIS — L97529 Non-pressure chronic ulcer of other part of left foot with unspecified severity: Secondary | ICD-10-CM

## 2014-03-21 DIAGNOSIS — M009 Pyogenic arthritis, unspecified: Secondary | ICD-10-CM | POA: Insufficient documentation

## 2014-03-21 DIAGNOSIS — R7881 Bacteremia: Secondary | ICD-10-CM

## 2014-03-21 DIAGNOSIS — A419 Sepsis, unspecified organism: Secondary | ICD-10-CM | POA: Insufficient documentation

## 2014-03-21 DIAGNOSIS — M869 Osteomyelitis, unspecified: Secondary | ICD-10-CM | POA: Insufficient documentation

## 2014-03-21 DIAGNOSIS — L97509 Non-pressure chronic ulcer of other part of unspecified foot with unspecified severity: Secondary | ICD-10-CM

## 2014-03-21 DIAGNOSIS — L89159 Pressure ulcer of sacral region, unspecified stage: Secondary | ICD-10-CM

## 2014-03-21 DIAGNOSIS — E11621 Type 2 diabetes mellitus with foot ulcer: Secondary | ICD-10-CM

## 2014-03-21 LAB — GLUCOSE, CAPILLARY
GLUCOSE-CAPILLARY: 180 mg/dL — AB (ref 70–99)
GLUCOSE-CAPILLARY: 201 mg/dL — AB (ref 70–99)
GLUCOSE-CAPILLARY: 181 mg/dL — AB (ref 70–99)
GLUCOSE-CAPILLARY: 210 mg/dL — AB (ref 70–99)
Glucose-Capillary: 235 mg/dL — ABNORMAL HIGH (ref 70–99)

## 2014-03-21 LAB — CBC WITH DIFFERENTIAL/PLATELET
BASOS ABS: 0 10*3/uL (ref 0.0–0.1)
BASOS PCT: 0 % (ref 0–1)
Eosinophils Absolute: 0 10*3/uL (ref 0.0–0.7)
Eosinophils Relative: 0 % (ref 0–5)
HEMATOCRIT: 26.9 % — AB (ref 36.0–46.0)
HEMOGLOBIN: 8.7 g/dL — AB (ref 12.0–15.0)
Lymphocytes Relative: 13 % (ref 12–46)
Lymphs Abs: 0.7 10*3/uL (ref 0.7–4.0)
MCH: 27.3 pg (ref 26.0–34.0)
MCHC: 32.3 g/dL (ref 30.0–36.0)
MCV: 84.3 fL (ref 78.0–100.0)
MONO ABS: 0.3 10*3/uL (ref 0.1–1.0)
Monocytes Relative: 5 % (ref 3–12)
NEUTROS ABS: 4.4 10*3/uL (ref 1.7–7.7)
Neutrophils Relative %: 82 % — ABNORMAL HIGH (ref 43–77)
Platelets: 81 10*3/uL — ABNORMAL LOW (ref 150–400)
RBC: 3.19 MIL/uL — ABNORMAL LOW (ref 3.87–5.11)
RDW: 16.9 % — AB (ref 11.5–15.5)
WBC: 5.4 10*3/uL (ref 4.0–10.5)

## 2014-03-21 LAB — HEPATITIS PANEL, ACUTE
HCV Ab: NEGATIVE
HEP A IGM: NONREACTIVE
Hep B C IgM: NONREACTIVE
Hepatitis B Surface Ag: NEGATIVE

## 2014-03-21 LAB — COMPREHENSIVE METABOLIC PANEL
ALT: 15 U/L (ref 0–35)
ANION GAP: 9 (ref 5–15)
AST: 72 U/L — ABNORMAL HIGH (ref 0–37)
Albumin: 1 g/dL — ABNORMAL LOW (ref 3.5–5.2)
Alkaline Phosphatase: 203 U/L — ABNORMAL HIGH (ref 39–117)
BUN: 15 mg/dL (ref 6–23)
CALCIUM: 7.4 mg/dL — AB (ref 8.4–10.5)
CO2: 21 mEq/L (ref 19–32)
Chloride: 105 mEq/L (ref 96–112)
Creatinine, Ser: 0.25 mg/dL — ABNORMAL LOW (ref 0.50–1.10)
GLUCOSE: 218 mg/dL — AB (ref 70–99)
Potassium: 3.7 mEq/L (ref 3.7–5.3)
Sodium: 135 mEq/L — ABNORMAL LOW (ref 137–147)
TOTAL PROTEIN: 5.4 g/dL — AB (ref 6.0–8.3)
Total Bilirubin: 1 mg/dL (ref 0.3–1.2)

## 2014-03-21 LAB — HEPARIN LEVEL (UNFRACTIONATED): Heparin Unfractionated: <0.1 IU/mL — ABNORMAL LOW (ref 0.30–0.70)

## 2014-03-21 LAB — TYPE AND SCREEN
ABO/RH(D): A POS
Antibody Screen: NEGATIVE
Unit division: 0

## 2014-03-21 LAB — HEMOGLOBIN A1C
HEMOGLOBIN A1C: 6.8 % — AB (ref <5.7)
Mean Plasma Glucose: 148 mg/dL — ABNORMAL HIGH (ref <117)

## 2014-03-21 LAB — SEDIMENTATION RATE: Sed Rate: 90 mm/hr — ABNORMAL HIGH (ref 0–22)

## 2014-03-21 LAB — PROTIME-INR
INR: 1.2 (ref 0.00–1.49)
Prothrombin Time: 15.3 seconds — ABNORMAL HIGH (ref 11.6–15.2)

## 2014-03-21 LAB — HIV ANTIBODY (ROUTINE TESTING W REFLEX): HIV 1&2 Ab, 4th Generation: NONREACTIVE

## 2014-03-21 LAB — PREALBUMIN: Prealbumin: <3 mg/dL — ABNORMAL LOW (ref 17.0–34.0)

## 2014-03-21 LAB — C-REACTIVE PROTEIN: CRP: 16.1 mg/dL — AB (ref <0.60)

## 2014-03-21 MED ORDER — HEPARIN (PORCINE) IN NACL 100-0.45 UNIT/ML-% IJ SOLN
1200.0000 [IU]/h | INTRAMUSCULAR | Status: DC
  Administered 2014-03-21: 1200 [IU]/h via INTRAVENOUS
  Filled 2014-03-21 (×2): qty 250

## 2014-03-21 MED ORDER — POTASSIUM CHLORIDE CRYS ER 20 MEQ PO TBCR
20.0000 meq | EXTENDED_RELEASE_TABLET | ORAL | Status: AC
  Administered 2014-03-21: 20 meq via ORAL

## 2014-03-21 MED ORDER — POTASSIUM CHLORIDE CRYS ER 20 MEQ PO TBCR
EXTENDED_RELEASE_TABLET | ORAL | Status: AC
  Filled 2014-03-21: qty 1

## 2014-03-21 MED ORDER — ASPIRIN 81 MG PO CHEW
CHEWABLE_TABLET | ORAL | Status: AC
  Filled 2014-03-21: qty 1

## 2014-03-21 MED ORDER — FUROSEMIDE 10 MG/ML IJ SOLN
20.0000 mg | Freq: Once | INTRAMUSCULAR | Status: AC
  Administered 2014-03-21: 20 mg via INTRAVENOUS
  Filled 2014-03-21: qty 2

## 2014-03-21 MED ORDER — POTASSIUM CHLORIDE CRYS ER 20 MEQ PO TBCR: 20.0000 meq | EXTENDED_RELEASE_TABLET | ORAL | Status: DC

## 2014-03-21 MED ORDER — HYDROCORTISONE NA SUCCINATE PF 100 MG IJ SOLR
INTRAMUSCULAR | Status: AC
  Filled 2014-03-21: qty 2

## 2014-03-21 MED ORDER — ASPIRIN 81 MG PO CHEW
81.0000 mg | CHEWABLE_TABLET | Freq: Every day | ORAL | Status: DC
  Administered 2014-03-21 – 2014-03-27 (×6): 81 mg via ORAL
  Filled 2014-03-21 (×5): qty 1

## 2014-03-21 MED ORDER — MIRTAZAPINE 30 MG PO TBDP
30.0000 mg | ORAL_TABLET | Freq: Every day | ORAL | Status: DC
  Administered 2014-03-21 – 2014-03-22 (×2): 30 mg via ORAL
  Filled 2014-03-21 (×3): qty 1

## 2014-03-21 MED ORDER — ALUM & MAG HYDROXIDE-SIMETH 200-200-20 MG/5ML PO SUSP
30.0000 mL | Freq: Once | ORAL | Status: AC
  Administered 2014-03-21: 30 mL via ORAL
  Filled 2014-03-21: qty 30

## 2014-03-21 MED ORDER — CEFAZOLIN SODIUM-DEXTROSE 2-3 GM-% IV SOLR
2.0000 g | Freq: Three times a day (TID) | INTRAVENOUS | Status: DC
  Administered 2014-03-21 – 2014-04-02 (×37): 2 g via INTRAVENOUS
  Filled 2014-03-21 (×41): qty 50

## 2014-03-21 MED ORDER — HEPARIN (PORCINE) IN NACL 100-0.45 UNIT/ML-% IJ SOLN
950.0000 [IU]/h | INTRAMUSCULAR | Status: DC
  Administered 2014-03-21: 950 [IU]/h via INTRAVENOUS
  Filled 2014-03-21: qty 250

## 2014-03-21 MED ORDER — VANCOMYCIN HCL IN DEXTROSE 1-5 GM/200ML-% IV SOLN
INTRAVENOUS | Status: AC
  Filled 2014-03-21: qty 200

## 2014-03-21 MED ORDER — CHLORHEXIDINE GLUCONATE 0.12 % MT SOLN
OROMUCOSAL | Status: AC
  Filled 2014-03-21: qty 15

## 2014-03-21 NOTE — Progress Notes (Signed)
[facility] ADULT ICU REPLACEMENT PROTOCOL FOR AM LAB REPLACEMENT ONLY  The patient does apply for the [facility] Adult ICU Electrolyte Replacment Protocol based on the criteria listed below:   1. Is GFR >/= 40 ml/min? Yes.    Patient's GFR today is >90 2. Is urine output >/= 0.5 ml/kg/hr for the last 6 hours? Yes.   Patient's UOP is 1.28 ml/kg/hr 3. Is BUN < 60 mg/dL? Yes.    Patient's BUN today is 15 4. Abnormal electrolyte k 3.7 5. Ordered repletion with: per protocol 6. If a panic level lab has been reported, has the CCM MD in charge been notified? Yes.  .   Physician:  Sharol Roussel 03/21/2014 5:19 AM

## 2014-03-21 NOTE — Progress Notes (Signed)
ANTICOAGULATION CONSULT NOTE   Pharmacy Consult for heparin Indication: chest pain/ACS  Allergies  Allergen Reactions  . Penicillins Rash    Patient Measurements: Height: 5\' 4"  (162.6 cm) Weight: 215 lb (97.523 kg) IBW/kg (Calculated) : 54.7 Heparin Dosing Weight: 77kg  Vital Signs: Temp: 98 F (36.7 C) (12/05 1634) Temp Source: Oral (12/05 1634) BP: 103/46 mmHg (12/05 1600) Pulse Rate: 70 (12/05 1600)  Labs:  Recent Labs  03/19/14 1508 03/19/14 1944 03/20/14 0108 03/20/14 0820 03/21/14 0357 03/21/14 0954 03/21/14 1406  HGB 7.9* 7.1* 7.0* 8.8* 8.7*  --   --   HCT 24.7* 22.0* 21.9* 27.1* 26.9*  --   --   PLT 77* 52* 48* 63* 81*  --   --   LABPROT 16.2*  --   --   --   --  15.3*  --   INR 1.29  --   --   --   --  1.20  --   HEPARINUNFRC  --   --   --   --   --   --  <0.10*  CREATININE 0.37* 0.32* 0.29*  --  0.25*  --   --   TROPONINI  --  0.82* 7.66* >20.00*  --   --   --     Estimated Creatinine Clearance: 84.8 mL/min (by C-G formula based on Cr of 0.25).  Infusions:  . heparin 950 Units/hr (03/21/14 0921)  . phenylephrine (NEO-SYNEPHRINE) Adult infusion 50 mcg/min (03/21/14 1352)    Assessment: 58 yoF admitted 12/3 from home refusing care, wanting to hurt herself. Pt with recent admission in October for MSSA bacteremia and sepsis. Pt appeared septic on admission and was started on broad spectrum antibiotics per Pharmacy.   Mildly elevated troponin noted on admission, MD thought secondary to demand ischemia due to infectious process. Pt with critical troponin overnight 12/4, also with anemia, thrombocytopenia. MD notes ECG is unchanged from 10/14, minimal STE were present then. Cardiology recommended starting heparin 12/4, patient is poor cath candidate considering ongoing sepsis, anemia and thrombocytopenia. Heparin not started as patient has been asymptomatic.   Troponins continue to increase, and Cardiology MD notes more changes on EKG. Pharmacy is now  consulted to dose heparin without bolus for 48 hours.   Initial heparin level < 0.1 on 950 units/hr  No issues with infusion or bleeding per RN  Hgb goal > 8.0 per MD, currently Hgb=8.7  plts low at 81k, MD aware  No bleeding issues noted  Noted patient placed on aspirin 81mg  daily per cardiology as long as plt > 50k  Goal of Therapy:  Heparin level 0.3-0.7 units/ml Monitor platelets by anticoagulation protocol: Yes   Plan:  - Initial heparin level undetectable, no bolus per MD request, increase rate 1200 units/hr - heparin level in 6 hours - daily heparin level and CBC - monitor for bleeding   14/4, PharmD, BCPS.   Pager: 03/21/2014 5:30 PM

## 2014-03-21 NOTE — Progress Notes (Signed)
VASCULAR LAB PRELIMINARY  ARTERIAL  ABI completed: Study was technically difficult due to patient pain level.   RIGHT    LEFT    PRESSURE WAVEFORM  PRESSURE WAVEFORM  BRACHIAL 91 Triphasic BRACHIAL Unable to assess due to IV location   DP 122 Triphasic DP 114 Monophasic  AT   AT    PT 124 Triphasic PT 131 Triphasic  PER   PER    GREAT TOE  NA GREAT TOE  NA    RIGHT LEFT  ABI 1.36 1.44   Bilateral ABIs are elevated, suggestive of calcified vessels.  Preliminary results discussed with Dr. Waymon Amato.  03/21/2014 8:59 AM Gertie Fey, RVT, RDCS, RDMS

## 2014-03-21 NOTE — Progress Notes (Addendum)
Patient Name: Robin Schroeder Date of Encounter: 03/21/2014  Active Problems:   Sepsis   DM2 (diabetes mellitus, type 2)   Physical deconditioning   Hypoalbuminemia   Hyponatremia   Elevated troponin   Severe sepsis   Acute UTI   Dehydration with hyponatremia   Protein-calorie malnutrition, severe   Diabetic foot ulcer   Sacral decubitus ulcer   Acute pulmonary edema   Effusion into joint   Length of Stay: 2  SUBJECTIVE  The patient denies any chest pain or SOB> She is laying flat in bed. Troponin peaked at >20. Repeat echo from 12/4 still pending. H/H has improved slightly to ~9/27, looks like 1U of PRBC's were issued. Platelet count is rebounding, now up to 81K.  CURRENT MEDS . antiseptic oral rinse  7 mL Mouth Rinse q12n4p  . atorvastatin  20 mg Oral q1800  . chlorhexidine  15 mL Mouth Rinse BID  . collagenase   Topical Daily  . feeding supplement (GLUCERNA SHAKE)  237 mL Oral TID BM  . fludrocortisone  0.1 mg Oral Daily  . hydrocortisone sod succinate (SOLU-CORTEF) inj  50 mg Intravenous Q6H  . insulin aspart  0-9 Units Subcutaneous TID WC  . potassium chloride  20 mEq Oral Q4H  . sodium chloride  3 mL Intravenous Q12H  . vancomycin  1,250 mg Intravenous Q12H   . sodium chloride 50 mL/hr at 03/21/14 0600  . phenylephrine (NEO-SYNEPHRINE) Adult infusion 20 mcg/min (03/21/14 0600)     OBJECTIVE  Filed Vitals:   03/21/14 0515 03/21/14 0530 03/21/14 0600 03/21/14 0630  BP: 121/58 100/47 90/52 90/48   Pulse: 61 59 58 62  Temp:      TempSrc:      Resp: 16 13 19 17   Height:      Weight:      SpO2: 97% 96% 96% 96%    Intake/Output Summary (Last 24 hours) at 03/21/14 0732 Last data filed at 03/21/14 0600  Gross per 24 hour  Intake 3563.38 ml  Output   1285 ml  Net 2278.38 ml   Filed Weights   03/19/14 1550  Weight: 215 lb (97.523 kg)    PHYSICAL EXAM  General: Pleasant, NAD. Neuro: Alert and oriented X 3. Moves all extremities  spontaneously. Psych: Normal affect. HEENT:  Normal  Neck: Supple without bruits or JVD. Lungs:  Resp regular and unlabored, CTA. Heart: RRR no s3, s4, or murmurs. Abdomen: Soft, non-tender, non-distended, BS + x 4.  Extremities: No clubbing, cyanosis or edema. DP/PT/Radials 2+ and equal bilaterally.  Accessory Clinical Findings  CBC  Recent Labs  03/19/14 1508  03/20/14 0820 03/21/14 0357  WBC 4.2  < > 4.0 5.4  NEUTROABS 3.8  --   --  4.4  HGB 7.9*  < > 8.8* 8.7*  HCT 24.7*  < > 27.1* 26.9*  MCV 84.3  < > 83.9 84.3  PLT 77*  < > 63* 81*  < > = values in this interval not displayed. Basic Metabolic Panel  Recent Labs  03/20/14 0108 03/20/14 0820 03/21/14 0357  NA 129*  --  135*  K 3.2*  --  3.7  CL 98  --  105  CO2 22  --  21  GLUCOSE 150*  --  218*  BUN 17  --  15  CREATININE 0.29*  --  0.25*  CALCIUM 6.5*  --  7.4*  MG  --  1.6  --    Liver Function Tests  Recent  Labs  03/20/14 0108 03/21/14 0357  AST 60* 72*  ALT 11 15  ALKPHOS 157* 203*  BILITOT 1.0 1.0  PROT 4.9* 5.4*  ALBUMIN 0.9* 1.0*   No results for input(s): LIPASE, AMYLASE in the last 72 hours. Cardiac Enzymes  Recent Labs  03/19/14 1944 03/20/14 0108 03/20/14 0820  TROPONINI 0.82* 7.66* >20.00*    Recent Labs  03/19/14 1944  HGBA1C 7.0*   Radiology/Studies  Dg Chest Port 1 View  03/20/2014   CLINICAL DATA:  Fever and sepsis  EXAM: PORTABLE CHEST - 1 VIEW  COMPARISON:  March 19, 2014 study obtained earlier in the day ; January 27, 2014  FINDINGS: There is mild cardiomegaly with mild generalized interstitial edema. There is atelectatic change in the left base with small left effusion. Pulmonary vascularity is within normal limits. Bones are osteoporotic.  IMPRESSION: Cardiomegaly with interstitial edema. These findings are felt to represent a degree of congestive heart failure. Atelectasis left base. Small left effusion.   Electronically Signed   By: Bretta Bang M.D.   On:  03/20/2014 09:34   Dg Chest Port 1 View  (if Code Sepsis Called)  03/19/2014   CLINICAL DATA:  Failure to thrive  EXAM: PORTABLE CHEST - 1 VIEW  COMPARISON:  01/27/2014  FINDINGS: Patient is rotated considerably to the left and the study was not repeated. Study is significantly limited. Lungs are grossly clear.  IMPRESSION: Recommend repeat study. The patient is rotated considerably. Lungs are grossly clear.   Electronically Signed   By: Marlan Palau M.D.   On: 03/19/2014 14:51   Dg Knee Left Port  03/20/2014   CLINICAL DATA:  Bilateral foot can knee pain, initial evaluation, multiple open wounds due to bacterial infection, bilateral knee effusions  EXAM: PORTABLE LEFT KNEE - 1-2 VIEW  COMPARISON:  None.  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 The medial cortex of the medial tibial plateau demonstrates loss of definition. This is concerning for the possibility of osteomyelitis. The cortex along the medial margin of the distal medial femoral condyles is also indistinct. There is medial soft tissue swelling at the joint line.  IMPRESSION: The findings are concerning for cellulitis with underlying osteomyelitis medially.   Electronically Signed   By: Esperanza Heir M.D.   On: 03/20/2014 17:51   Dg Knee Right Port  03/20/2014   CLINICAL DATA:  Bilateral knee pain  EXAM: PORTABLE RIGHT KNEE - 1-2 VIEW  COMPARISON:  None.  FINDINGS: Severe tricompartmental degenerative change is noted. Small suprapatellar effusion. No fracture or dislocation. No radiopaque foreign body.  IMPRESSION: Severe tricompartmental degenerative change.   Electronically Signed   By: Christiana Pellant M.D.   On: 03/20/2014 17:51   Dg Foot Complete Left  03/20/2014   CLINICAL DATA:  Left heel wound  EXAM: LEFT FOOT - COMPLETE 3+ VIEW  COMPARISON:  None.  FINDINGS: There is extensive artifact from material overlying the foot which significantly decreases sensitivity and specificity for detection of  osseous or soft tissue abnormalities. No grossly displaced fracture is identified. Bones are subjectively osteopenic. Dorsal soft tissue swelling is evident with plantar calcaneal spurring and vascular calcifications noted.  IMPRESSION: Suboptimal visualization due to support apparatus, but with nonspecific dorsal soft tissue swelling identified.   Electronically Signed   By: Christiana Pellant M.D.   On: 03/20/2014 17:07   Dg Foot Complete Right  03/20/2014   CLINICAL DATA:  Bilateral foot pain, open skin wound  EXAM: RIGHT FOOT  COMPLETE - 3+ VIEW  COMPARISON:  None.  FINDINGS: Bones are subjectively osteopenic. Positioning is suboptimal due to patient immobility and portable technique. Degenerative changes are noted at the midfoot. No fracture or dislocation allowing for positioning. Plantar calcaneal spurring noted. There is soft tissue swelling involving the dorsum of the foot and adjacent to the lateral malleolus. No radiopaque foreign body.  IMPRESSION: Subjective osteopenia without acute osseous abnormality.  Dorsal soft tissue swelling and swelling over the lateral malleolus. Correlate for cellulitis or skin abnormalities in this area.   Electronically Signed   By: Christiana Pellant M.D.   On: 03/20/2014 17:51    TELE: SR to sinus tachycardia, ST elevation inferiorly  ECG (12/5):   ASSESSMENT AND PLAN  Active Problems:  Sepsis  58 year old female with multiple medical problems including HTN, obesity, DM and chronic diastolic CHF who was discharged from the hospital in October after she was treated for MSSA bacteremia with sepsis who was forced to come to the hospital today by her husband for evaluation of infected decubitus ulcers. Cardiology was consulted for a mildly elevated POC troponin and some STE on her ECGs.   Inferolateral STEMI-troponin >20 yesterday morning, minimal STE was present, but it is now more apparent and 1 mm in V2-v6 and II, III, AVF. This is more consistent with acute MI,  although the patient is asymptomatic and not having chest pain at this time - this may be due to diabetic neuropathy. The EKG has clearly changed compared to the 12/3 EKG, however, we are now more than 24 hours since occlusion of the infarct artery - therefore, emergent cardiac catheterization would be less beneficial. She continues to be a poor cath candidate considering ongoing sepsis, anemia and thrombocytopenia. H/H appear stable after transfusion - would start heparin without bolus for 48 hours. At this point, would add low dose 81 mg aspirin daily - platelet count is >50K.  Will review echo today, but expect new inferolateral WMA.  Chrystie Nose, MD, [facility] Attending Cardiologist [facility]  HILTY,Kenneth C 03/21/2014

## 2014-03-21 NOTE — Progress Notes (Addendum)
  Chaplain paged at Robin Schroeder's request. Robin Schroeder is frightened of death, and frightened about (in her words) "having a heart attack and almost dying." She reports that in her life she has done things displeasing to God and that she wishes to (in her words) "be saved from her sins."  She further reports that she is not a member of a community of faith, has no history with any faith tradition, denomination, sect or group. In our discussion it is clear that her spiritual grounding, from whatever spiritual connections she may have had in her life, is in the [place], evangelical, tradition. She speaks of wishing to be saved, repeating of her sins, no returning to her sinful ways, and God being displeased with her life and life style.  Robin Schroeder reports that she had stopped eating and wished to die rather than continue living as she had. She speaks of being bedridden, and left alone for long periods of time. Chart indicates that Robin Schroeder has been seen by LCSW staff concerning her living conditions. Social Work please page the chaplain should a LCSW/[place] consultation be required.  Without any faith community to refer her to, and her insistence on being baptized, the chaplain performed the rite of baptism by sprinkling, upon her confession of Christian faith. Nurses provided water and towels.  After the rite of baptism, Robin Schroeder requested and received two [place], one for her and one for her husband. The room sitter showed her the bibles and told her she would placed them in the room. It was determined that Robin Schroeder presently is too weak to hold or read a book of any weight.  Robin Schroeder speaks of being much relieved spiritually and emotionally. When chaplain spoke of Robin Schroeder's new spiritual life, she indicated she felt less like wanting to die.   Chaplains should be paged should Robin Schroeder feel the desire or need to speak with a spiritual care provider.  Benjie Karvonen. Elleigh Cassetta,  DMin Chaplain

## 2014-03-21 NOTE — Progress Notes (Signed)
[facility]       Day # 3 vancomycin Day # 1 cefazolin  Also had 2 days aztreonam, 1 day levaquin  Subjective:  Still unhappy at pain with turning   Antibiotics:  Anti-infectives    Start     Dose/Rate Route Frequency Ordered Stop   03/21/14 1200  ceFAZolin (ANCEF) IVPB 2 g/50 mL premix     2 g100 mL/hr over 30 Minutes Intravenous Every 8 hours 03/21/14 1051     03/21/14 1041  vancomycin (VANCOCIN) 1 GM/200ML IVPB    Comments:  [name]   : cabinet override      03/21/14 1041 03/21/14 2244   03/20/14 1600  levofloxacin (LEVAQUIN) IVPB 750 mg  Status:  Discontinued     750 mg100 mL/hr over 90 Minutes Intravenous Every 24 hours 03/19/14 1629 03/19/14 1709   03/20/14 0000  vancomycin (VANCOCIN) 1,250 mg in sodium chloride 0.9 % 250 mL IVPB     1,250 mg166.7 mL/hr over 90 Minutes Intravenous Every 12 hours 03/19/14 1627     03/20/14 0000  aztreonam (AZACTAM) 2 g in dextrose 5 % 50 mL IVPB  Status:  Discontinued     2 g100 mL/hr over 30 Minutes Intravenous Every 8 hours 03/19/14 1628 03/20/14 1147   03/19/14 1445  levofloxacin (LEVAQUIN) IVPB 750 mg     750 mg100 mL/hr over 90 Minutes Intravenous  Once 03/19/14 1435 03/19/14 1708   03/19/14 1445  aztreonam (AZACTAM) 2 g in dextrose 5 % 50 mL IVPB     2 g100 mL/hr over 30 Minutes Intravenous  Once 03/19/14 1435 03/19/14 1708   03/19/14 1445  vancomycin (VANCOCIN) IVPB 1000 mg/200 mL premix     1,000 mg200 mL/hr over 60 Minutes Intravenous  Once 03/19/14 1435 03/19/14 1707      Medications: Scheduled Meds: . alum & mag hydroxide-simeth  30 mL Oral Once  . antiseptic oral rinse  7 mL Mouth Rinse q12n4p  . aspirin  81 mg Oral Daily  . atorvastatin  20 mg Oral q1800  .  ceFAZolin (ANCEF) IV  2 g Intravenous Q8H  . chlorhexidine  15 mL Mouth Rinse BID  . collagenase   Topical Daily  . feeding supplement (GLUCERNA SHAKE)  237 mL Oral TID BM  . fludrocortisone  0.1 mg Oral Daily  .  hydrocortisone sod succinate (SOLU-CORTEF) inj  50 mg Intravenous Q6H  . insulin aspart  0-9 Units Subcutaneous TID WC  . mirtazapine  30 mg Oral QHS  . potassium chloride SA      . sodium chloride  3 mL Intravenous Q12H  . vancomycin      . vancomycin  1,250 mg Intravenous Q12H   Continuous Infusions: . heparin 950 Units/hr (03/21/14 0921)  . phenylephrine (NEO-SYNEPHRINE) Adult infusion 50 mcg/min (03/21/14 1352)   PRN Meds:.sodium chloride, acetaminophen **OR** acetaminophen, ondansetron **OR** ondansetron (ZOFRAN) IV, sodium chloride    Objective: Weight change:   Intake/Output Summary (Last 24 hours) at 03/21/14 1543 Last data filed at 03/21/14 1500  Gross per 24 hour  Intake 3291.81 ml  Output    925 ml  Net 2366.81 ml   Blood pressure 109/66, pulse 76, temperature 97.9 F (36.6 C), temperature source Oral, resp. rate 18, height 5\' 4"  (1.626 m), weight 215 lb (97.523 kg), SpO2 93 %. Temp:  [97.4 F (36.3 C)-98.5 F (36.9 C)] 97.9 F (36.6 C) (12/05 1224) Pulse Rate:  [51-87] 76 (12/05 1400) Resp:  [  13-26] 18 (12/05 1400) BP: (81-121)/(27-74) 109/66 mmHg (12/05 1400) SpO2:  [90 %-99 %] 93 % (12/05 1400)  Physical Exam: HEENT: EOMI, oropharynx clear and without exudate CVS regular rate, normal r, no murmur rubs or gallops Chest: clear to auscultation bilaterally, no wheezing, rales or rhonchi Abdomen: soft nontender, nondistended, normal bowel sounds, Extremities: sklin: multiple ecchymoses, petechiae See pix from yesterday, does have pain about knee and calf Neuro: nonfocal  CBC:  Recent Labs Lab 03/19/14 1508 03/19/14 1944 03/20/14 0108 03/20/14 0820 03/21/14 0357 03/21/14 0954  HGB 7.9* 7.1* 7.0* 8.8* 8.7*  --   HCT 24.7* 22.0* 21.9* 27.1* 26.9*  --   PLT 77* 52* 48* 63* 81*  --   INR 1.29  --   --   --   --  1.20     BMET  Recent Labs  03/20/14 0108 03/21/14 0357  NA 129* 135*  K 3.2* 3.7  CL 98 105  CO2 22 21  GLUCOSE 150* 218*  BUN  17 15  CREATININE 0.29* 0.25*  CALCIUM 6.5* 7.4*     Liver Panel   Recent Labs  03/20/14 0108 03/21/14 0357  PROT 4.9* 5.4*  ALBUMIN 0.9* 1.0*  AST 60* 72*  ALT 11 15  ALKPHOS 157* 203*  BILITOT 1.0 1.0       Sedimentation Rate  Recent Labs  03/21/14 0357  ESRSEDRATE 90*   C-Reactive Protein No results for input(s): CRP in the last 72 hours.  Micro Results: Recent Results (from the past 720 hour(s))  Urine culture     Status: None (Preliminary result)   Collection Time: 03/19/14  3:04 PM  Result Value Ref Range Status   Specimen Description URINE, CATHETERIZED  Final   Special Requests NONE  Final   Culture  Setup Time   Final    03/20/2014 00:29 Performed at [facility] Count PENDING  Incomplete   Culture   Final    Culture reincubated for better growth Performed at [facility]    Report Status PENDING  Incomplete  Blood Culture (routine x 2)     Status: None (Preliminary result)   Collection Time: 03/19/14  3:23 PM  Result Value Ref Range Status   Specimen Description BLOOD RIGHT WRIST  Final   Special Requests BOTTLES DRAWN AEROBIC AND ANAEROBIC 5 ML  Final   Culture  Setup Time   Final    03/20/2014 07:48 Performed at [facility]    Culture   Final    STAPHYLOCOCCUS AUREUS Note: RIFAMPIN AND GENTAMICIN SHOULD NOT BE USED AS SINGLE DRUGS FOR TREATMENT OF STAPH INFECTIONS. Note: Gram Stain Report Called to,Read Back By and Verified With: FAYE S BY INGRAM A 03/20/14 11AM Performed at [facility]    Report Status PENDING  Incomplete  Blood Culture (routine x 2)     Status: None (Preliminary result)   Collection Time: 03/19/14  3:23 PM  Result Value Ref Range Status   Specimen Description BLOOD BLOOD LEFT FOREARM  Final   Special Requests BOTTLES DRAWN AEROBIC AND ANAEROBIC 5 ML  Final   Culture  Setup Time   Final    03/20/2014 07:51 Performed at [facility]    Culture   Final     STAPHYLOCOCCUS AUREUS Note: Gram Stain Report Called to,Read Back By and Verified With: FAYE S BY INGRAM A 03/20/14 11AM Performed at [facility]    Report Status PENDING  Incomplete  Studies/Results: Dg Chest Port 1 View  03/21/2014   CLINICAL DATA:  Acute pulmonary edema. History of hypertension, diabetes and coronary artery disease. Initial encounter.  EXAM: PORTABLE CHEST - 1 VIEW  COMPARISON:  Radiographs 01/27/2014 and 03/20/2014.  FINDINGS: 1929 hr. The heart size and mediastinal contours are stable. There is persistent elevation of the left hemidiaphragm with increased left basilar airspace disease. The right lung is clear. There is no pneumothorax or significant pleural effusion. No acute osseous findings are demonstrated. Degenerative changes are present throughout the thoracic spine and at both shoulders. Telemetry leads overlie the chest.  IMPRESSION: Progressive left lower lobe airspace disease. Although in part chronic, this could reflect superimposed aspiration. No overt pulmonary edema.   Electronically Signed   By: Roxy Horseman M.D.   On: 03/21/2014 08:10   Dg Chest Port 1 View  03/20/2014   CLINICAL DATA:  Fever and sepsis  EXAM: PORTABLE CHEST - 1 VIEW  COMPARISON:  March 19, 2014 study obtained earlier in the day ; January 27, 2014  FINDINGS: There is mild cardiomegaly with mild generalized interstitial edema. There is atelectatic change in the left base with small left effusion. Pulmonary vascularity is within normal limits. Bones are osteoporotic.  IMPRESSION: Cardiomegaly with interstitial edema. These findings are felt to represent a degree of congestive heart failure. Atelectasis left base. Small left effusion.   Electronically Signed   By: Bretta Bang M.D.   On: 03/20/2014 09:34   Dg Knee Left Port  03/20/2014   CLINICAL DATA:  Bilateral foot can knee pain, initial evaluation, multiple open wounds due to bacterial infection, bilateral knee effusions  EXAM:  PORTABLE LEFT KNEE - 1-2 VIEW  COMPARISON:  None.  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 The medial cortex of the medial tibial plateau demonstrates loss of definition. This is concerning for the possibility of osteomyelitis. The cortex along the medial margin of the distal medial femoral condyles is also indistinct. There is medial soft tissue swelling at the joint line.  IMPRESSION: The findings are concerning for cellulitis with underlying osteomyelitis medially.   Electronically Signed   By: Esperanza Heir M.D.   On: 03/20/2014 17:51   Dg Knee Right Port  03/20/2014   CLINICAL DATA:  Bilateral knee pain  EXAM: PORTABLE RIGHT KNEE - 1-2 VIEW  COMPARISON:  None.  FINDINGS: Severe tricompartmental degenerative change is noted. Small suprapatellar effusion. No fracture or dislocation. No radiopaque foreign body.  IMPRESSION: Severe tricompartmental degenerative change.   Electronically Signed   By: Christiana Pellant M.D.   On: 03/20/2014 17:51   Dg Foot Complete Left  03/20/2014   CLINICAL DATA:  Left heel wound  EXAM: LEFT FOOT - COMPLETE 3+ VIEW  COMPARISON:  None.  FINDINGS: There is extensive artifact from material overlying the foot which significantly decreases sensitivity and specificity for detection of osseous or soft tissue abnormalities. No grossly displaced fracture is identified. Bones are subjectively osteopenic. Dorsal soft tissue swelling is evident with plantar calcaneal spurring and vascular calcifications noted.  IMPRESSION: Suboptimal visualization due to support apparatus, but with nonspecific dorsal soft tissue swelling identified.   Electronically Signed   By: Christiana Pellant M.D.   On: 03/20/2014 17:07   Dg Foot Complete Right  03/20/2014   CLINICAL DATA:  Bilateral foot pain, open skin wound  EXAM: RIGHT FOOT COMPLETE - 3+ VIEW  COMPARISON:  None.  FINDINGS: Bones are subjectively osteopenic. Positioning is suboptimal due to patient immobility  and  portable technique. Degenerative changes are noted at the midfoot. No fracture or dislocation allowing for positioning. Plantar calcaneal spurring noted. There is soft tissue swelling involving the dorsum of the foot and adjacent to the lateral malleolus. No radiopaque foreign body.  IMPRESSION: Subjective osteopenia without acute osseous abnormality.  Dorsal soft tissue swelling and swelling over the lateral malleolus. Correlate for cellulitis or skin abnormalities in this area.   Electronically Signed   By: Christiana Pellant M.D.   On: 03/20/2014 17:51      Assessment/Plan:  Active Problems:   Sepsis   DM2 (diabetes mellitus, type 2)   Physical deconditioning   Hypoalbuminemia   Hyponatremia   Elevated troponin   Severe sepsis   Acute UTI   Dehydration with hyponatremia   Protein-calorie malnutrition, severe   Diabetic foot ulcer   Sacral decubitus ulcer   Acute pulmonary edema   Effusion into joint   ST elevation myocardial infarction (STEMI) of inferolateral wall, initial episode of care   Severe sepsis with septic shock    Robin Schroeder is a 58 y.o. 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 SAB with septic shock possible osteomyelitis of femur     [facility] Staphylococcus aureus bacteremia   Staphylococcus aureus bacteremia (SAB) is associated with a high rate of complications and mortality.  Specific aspects of clinical management are critical to optimizing the outcome of patients with SAB.  Therefore, the [facility]icrobial Management Team [facility]) has initiated an intervention aimed at improving the management of SAB at [facility].  To do so, Infectious Diseases physicians are providing an evidence-based consult for the management of all patients with SAB.     Yes No Comments  Perform follow-up blood cultures  (even if the patient is afebrile) to ensure clearance of bacteremia [x]  []  03/20/14 cultures collected  Remove vascular catheter and obtain follow-up blood cultures after the removal of the catheter [x]  []  PICC line is out from 03/19/14 DO NOT PLACE long term access IF at all possible until we have proven clearance of her bacteremia  Perform echocardiography to evaluate for endocarditis (transthoracic ECHO is 40-50% sensitive, TEE is > 90% sensitive) [x]  []  Please keep in mind, that neither test can definitively EXCLUDE endocarditis, and that should clinical suspicion remain high for endocarditis the patient should then still be treated with an "endocarditis" duration of therapy = 6 weeks TTE negative, she is too sick for TEE  Consult electrophysiologist to evaluate implanted cardiac device (pacemaker, ICD) []  []  NA  Ensure source control []  []  Have all abscesses been drained effectively? Have deep seeded infections (septic joints or osteomyelitis) had appropriate surgical debridement?  NO THERE IS CONCERN FOR SEPTIC KNEE AND OSTEO OF FEMUR  Investigate for "metastatic" sites of infection []  []  Does the patient have ANY symptom or physical exam finding that would suggest a deeper infection (back or neck pain that may be suggestive of vertebral osteomyelitis or epidural abscess, muscle pain that could be a symptom of pyomyositis)?  Keep in mind that for deep seeded infections MRI imaging with contrast is preferred rather than other often insensitive tests such as plain x-rays, especially early in a patient's presentation.  WHEN STABLE WILL TRY TO GET MRI OF KNEE INCLUDING DISTAL FEMUR, TIB, FIB AND ANKLE  Change antibiotic therapy to ANCEF AND VANCOMYCIN PENDING SENSIS (She apparently tolerated  a dose of ceftriaxone at [facility] before) [x]  []  Beta-lactam antibiotics are preferred for MSSA due to higher cure rates.   If on Vancomycin, goal trough should be 15 - 20 mcg/mL  Estimated duration of IV antibiotic  therapy:    8 weeks if she survives []  []  Consult case management for probably prolonged outpatient IV antibiotic therapy        LOS: 2 days   Acey Lav 03/21/2014, 3:43 PM

## 2014-03-21 NOTE — Progress Notes (Signed)
ANTIBIOTIC CONSULT NOTE - INITIAL  Pharmacy Consult for vancomycin and cefazolin Indication: Staph aureus bacteremia  Allergies  Allergen Reactions  . Penicillins Rash    Patient Measurements: Height: 5\' 4"  (162.6 cm) Weight: 215 lb (97.523 kg) IBW/kg (Calculated) : 54.7 Adjusted Body Weight: 72kg  Assessment: 58 yoF with RA and recent MSSA bacteremia 10/13, blood cx cleared on 10/16. Some notes claim pt placed on cefazolin 2gm Q8hr, however can only find that patient was on vancomycin per Epic records. Noted TEE on 10/19 , negative for vegetations. She was to finish 14 day course on 10/30 using 10/16 as day 1. She was discharged to SNF for IV abx and rehab. On 12/3 pt brought to ED for IVC as pt refusing care. Pharmacy originally consulted to dose vancomycin for sepsis, now adding cefazolin for Staph aureus bacteremia until sensitivities result and antibiotics can be de-escalated.  Noted patient with penicillin allergy (rash), has received 1 dose ceftriaxone per records, no issues noted. D/w ID MD, to start cefazolin.  I alerted RN to plan and to watch for allergic reaction  Antiinfectives 12/3 >> Vancomycin >> 12/3 >> Aztreonam >>12/4 12/3 >> Levaquin x 1 dose 12/5 >> cefazolin >>  Labs / vitals Tmax: 101.6 WBC: WNL Renal: SCr low, stable; est CrCl 84 CG and N Lactate: 2.53 > 1.6  Microbiology 12/3 blood x2: 2/2 Staph Aureus, sens pending 12/3 urine: collected 12/4 acute hepatitis panel: IP 12/4 HIV Ab: IP 12/4 blood x2: IP  Levels/Dose changes: Previous dosing in Oct: 1500mg  q12h produced trough 22.4 with similar SCr 12/5 VT @ 2300 = _____ on 1250mg  IV q12h  12/5: D3 Vancomycin 1250mg  q12h based on previous dosing / D1 cefazolin 2g IV q8h for ?recurrent MSSA bacteremia. Will go ahead and check VT tonight incase pt reacts to cefazolin or cultures show MRSA and has to remain on vancomycin  Goal of Therapy:  Vancomycin trough level 15-20 mcg/ml cefazolin per  indication and renal function  Plan:  - continue vancomycin 1250mg  IV q12h - check vancomycin trough tonight at 2300 prior to 5th 1250mg  dose - start cefazolin 2g IV q8h - follow-up clinical course, culture results, renal function - follow-up antibiotic de-escalation and length of therapy  Thank you for the consult.  14/5, PharmD, BCPS Pager: [phone] Pharmacy: [phone] 03/21/2014 11:12 AM

## 2014-03-21 NOTE — Progress Notes (Signed)
ANTICOAGULATION CONSULT NOTE - Initial Consult  Pharmacy Consult for heparin Indication: chest pain/ACS  Allergies  Allergen Reactions  . Penicillins Rash    Patient Measurements: Height: 5\' 4"  (162.6 cm) Weight: 215 lb (97.523 kg) IBW/kg (Calculated) : 54.7 Heparin Dosing Weight: 77kg  Vital Signs: Temp: 97.4 F (36.3 C) (12/04 2303) Temp Source: Axillary (12/04 2303) BP: 101/54 mmHg (12/05 0745) Pulse Rate: 61 (12/05 0745)  Labs:  Recent Labs  03/19/14 1508 03/19/14 1944 03/20/14 0108 03/20/14 0820 03/21/14 0357  HGB 7.9* 7.1* 7.0* 8.8* 8.7*  HCT 24.7* 22.0* 21.9* 27.1* 26.9*  PLT 77* 52* 48* 63* 81*  LABPROT 16.2*  --   --   --   --   INR 1.29  --   --   --   --   CREATININE 0.37* 0.32* 0.29*  --  0.25*  TROPONINI  --  0.82* 7.66* >20.00*  --     Estimated Creatinine Clearance: 84.8 mL/min (by C-G formula based on Cr of 0.25).   Medical History: Past Medical History  Diagnosis Date  . Hypertension   . Diabetes mellitus without complication   . Arthritis   . Coronary artery disease     pt unaware    Medications:  Scheduled:  . antiseptic oral rinse  7 mL Mouth Rinse q12n4p  . aspirin  81 mg Oral Daily  . atorvastatin  20 mg Oral q1800  . chlorhexidine  15 mL Mouth Rinse BID  . collagenase   Topical Daily  . feeding supplement (GLUCERNA SHAKE)  237 mL Oral TID BM  . fludrocortisone  0.1 mg Oral Daily  . hydrocortisone sod succinate (SOLU-CORTEF) inj  50 mg Intravenous Q6H  . insulin aspart  0-9 Units Subcutaneous TID WC  . potassium chloride  20 mEq Oral Q4H  . sodium chloride  3 mL Intravenous Q12H  . vancomycin  1,250 mg Intravenous Q12H   Infusions:  . sodium chloride 50 mL/hr at 03/21/14 0600  . phenylephrine (NEO-SYNEPHRINE) Adult infusion 15 mcg/min (03/21/14 14/05/15)    Assessment: 58 yoF admitted 12/3 from home refusing care, wanting to hurt herself. Pt with recent admission in October for MSSA bacteremia and sepsis. Pt appeared septic  on admission and was started on broad spectrum antibiotics per Pharmacy.   Mildly elevated troponin noted on admission, MD thought secondary to demand ischemia due to infectious process. Pt with critical troponin overnight 12/4, also with anemia, thrombocytopenia. MD notes ECG is unchanged from 10/14, minimal STE were present then. Cardiology recommended starting heparin 12/4, patient is poor cath candidate considering ongoing sepsis, anemia and thrombocytopenia. Heparin not started as patient has been asymptomatic.   Troponins continue to increase, and Cardiology MD notes more changes on EKG. Pharmacy is now consulted to dose heparin without bolus for 48 hours.   No heparin bolus per cardiology  Hgb goal > 8.0 per MD, currently Hgb=8.7  plts low at 81k, MD aware  No bleeding issues noted  Noted patient placed on aspirin 81mg  daily per cardiology as long as plt > 50k  Goal of Therapy:  Heparin level 0.3-0.7 units/ml Monitor platelets by anticoagulation protocol: Yes   Plan:  - heparin 950 units/hr (~12 units/kg heparin dosing weight/hr) - heparin level in 6 hours - daily heparin level and CBC - monitor for bleeding  Thank you for the consult.  14/4, PharmD, BCPS Pager: [phone] Pharmacy: [phone] 03/21/2014 8:45 AM

## 2014-03-21 NOTE — Progress Notes (Addendum)
PROGRESS NOTE    Robin Schroeder ZOX:[id number] DOB: [birth year] DOA: 03/19/2014 PCP: No PCP Per Patient  HPI/Brief narrative 58 year old female patient with history of hypertension, obesity, DM, chronic diastolic CHF, possible rheumatoid arthritis, recent hospitalization for MSSA bacteremia of unknown source/neg TEE- cleared after Vancomycin (PCN allergy), went to SNF and then discharged from their to home, presented with complaints of not eating or drinking 4 days and husband was concerned that patient was trying to hurt herself and she was IVC and brought to ED where she was assessed as sepsis of unclear source and admitted to stepdown for further management. Patient went into septic shock requiring pressors. She also developed STEMI but poor overall candidate for aggressive intervention i.e. Currently. CCM, ID and cardiology assisting with care.  Assessment/Plan:  1. Septic shock secondary to staph bacteremia: Final cultures pending but possibly MSSA again. Patient had been empirically started on IV vancomycin and aztreonam. Following preliminary cultures, aztreonam discontinued. Source not clear-ruling out skin and soft tissue infection- ? Osteomyelitis of medial left knee on x-ray. Infectious disease input appreciated. Patient was started on Neo-Synephrine for shock on 11/4 and CCM following. Attempts to wean pressors overnight unsuccessful. DC'd IV fluids secondary to concern for pulmonary edema. Continue stress dose IV hydrocortisone and Fludrocortisone. As per ID, will eventually need repeat TEE when medically stable.  2. ? Osteomyelitis of medial left knee on x-ray: Await ID follow-up and recommendations regarding need for broadening antibiotic coverage and other recommendations. 3. Left lower lobe airspace disease: Possibly chronic.? Pneumonia. ID and pulmonary to weigh in the need for broadening antibiotic coverage. In the absence of respiratory symptoms, felt less likely.  4. Dehydration  with hyponatremia: Resolved. IV fluid discontinued due to concern for pulmonary edema/volume overload. 5. Inferiolateral wall STEMI: Patient has not complained of chest pain since admission. Troponins steadily increased to >20 on 12/3. Not candidate for aggressive intervention i.e. cath yesterday or today secondary to unstable status from shock, anemia and thrombocytopenia. As per cardiology follow-up 12/4, definitely has STEMI and have started low-dose aspirin and heparin drip since hemoglobin is stable posttransfusion and platelets have improved compared to admission. Beta blockers discontinued secondary to shock. Will eventually need cardiac catheterization when stable. Follow-up 2-D echo. Cardiology input appreciated. Continue statins. 6. IVC/?? Intention to self harm: Patient however repeatedly denies. 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 Psychiatry consulted on 12/4 to evaluate capacity, possible depression and? SI-felt less likely. 7. Anasarca: Secondary to ongoing illnesses, malnutrition and hypo-albuminemia. Treat underlying cause and attempt to improve nutritional status. Diuretics when able. 8. Right sacral decubitus: Does not appear grossly infected. Wound care consultation requested. 9. Hypertension: Patient in septic shock. Management as above. 10. Type II DM: SSI. Reasonable inpatient control. 11. Possible rheumatoid arthritis: Patient was on prednisone at home. Currently on stress dose IV hydrocortisone.  12. Chronic diastolic CHF: Seems compensated. Chest x-ray without pulmonary edema 12/5. 13. Hypokalemia: Replaced. 14. Anemia: Acute on chronic Unclear etiology. Baseline hemoglobin probably in the 9 g per DL range. Status post 1 unit PRBC 12/4 with appropriate response. Hemoglobin stable posttransfusion. 15. Thrombocytopenia: Possibly from acute illness/sepsis. Gradually improving. Follow closely while on IV heparin. 16. Multiple  pressure wounds: Details as per WOC nurse note on 12/4. Patient has left heel DTI, right arm full-thickness abrasion and unstageable right buttock wound. 17. Failure to thrive: Multifactorial 18. Severe deconditioning: PT and OT evaluation unable. 19. Severe protein  calorie malnutrition: Dietitian consulted.    Code Status: Full Family Communication: discussed with spouse multiple times on 12/4 and updated him on her ongoing critical illness and care in detail. He advised aggressive care and confirmed full CODE STATUS. Disposition Plan: Remains in ICU. Not medically stable for discharge.   Consultants:  Cardiology  ID  Psychiatry-pending  PCCM  Procedures:  Arterial line-discontinued  Antibiotics:  IV Vanc 12/3>  IV Aztreonam 12/3> 12/4   Subjective: States that she continues to feel better. Denies pain or dyspnea. Denies chest pain. Asking for food. States that she thinks she did something wrong leading to her illness and upset that her spouse told her that God is punishing her for something she did wrong.   Objective: Filed Vitals:   03/21/14 0830 03/21/14 0845 03/21/14 0900 03/21/14 0915  BP: 89/52  Pulse: 58 59 65 62  Temp:      TempSrc:      Resp: Height:      Weight:      SpO2: 96% 97% 97% 97%   temperature maximum: 101.89F.  Intake/Output Summary (Last 24 hours) at 03/21/14 1047 Last data filed at 03/21/14 0930  Gross per 24 hour  Intake 3080.38 ml  Output   1185 ml  Net 1895.38 ml   Filed Weights   03/19/14 1550  Weight: 97.523 kg (215 lb)     Exam:  General exam: Pleasant middle-aged female, moderately built and obese lying comfortably supine in bed. Respiratory system: Slightly diminished breath sounds in the bases but otherwise clear to auscultation. No increased work of breathing. Cardiovascular system: S1 & S2 heard, RRR. No JVD, murmurs, gallops, clicks. Anasarca. Telemetry: Sinus rhythm in the  60s. Gastrointestinal system: Abdomen is nondistended, soft and nontender. Normal bowel sounds heard. Patient has patchy ecchymosis over right upper quadrant. Central nervous system: Alert and oriented 2. No focal neurological deficits. Extremities: Symmetric 5 x 5 power. Skin: Dry appearing right sacral decubitus without signs of overt infection. Patient has skin tear over the right posterior upper arm. Left heel DTI without signs of acute infection. Psychiatry: Tearful.   Data Reviewed: Basic Metabolic Panel:  Recent Labs Lab 03/19/14 1508 03/19/14 1944 03/20/14 0108 03/20/14 0820 03/21/14 0357  NA 121*  --  129*  --  135*  K 3.8  --  3.2*  --  3.7  CL 82*  --  98  --  105  CO2 28  --  22  --  21  GLUCOSE 221*  --  150*  --  218*  BUN 24*  --  17  --  15  CREATININE 0.37* 0.32* 0.29*  --  0.25*  CALCIUM 7.8*  --  6.5*  --  7.4*  MG  --   --   --  1.6  --    Liver Function Tests:  Recent Labs Lab 03/19/14 1508 03/20/14 0108 03/21/14 0357  AST 46* 60* 72*  ALT ALKPHOS 219* 157* 203*  BILITOT 1.6* 1.0 1.0  PROT 6.6 4.9* 5.4*  ALBUMIN 1.2* 0.9* 1.0*   No results for input(s): LIPASE, AMYLASE in the last 168 hours. No results for input(s): AMMONIA in the last 168 hours. CBC:  Recent Labs Lab 03/19/14 1508 03/19/14 1944 03/20/14 0108 03/20/14 0820 03/21/14 0357  WBC 4.2 4.3 3.3* 4.0 5.4  NEUTROABS 3.8  --   --   --  4.4  HGB 7.9* 7.1* 7.0* 8.8*  8.7*  HCT 24.7* 22.0* 21.9* 27.1* 26.9*  MCV 84.3 85.6 85.9 83.9 84.3  PLT 77* 52* 48* 63* 81*   Cardiac Enzymes:  Recent Labs Lab 03/19/14 1944 03/20/14 0108 03/20/14 0820  TROPONINI 0.82* 7.66* >20.00*   BNP (last 3 results) No results for input(s): PROBNP in the last 8760 hours. CBG:  Recent Labs Lab 03/20/14 0809 03/20/14 1158 03/20/14 1541 03/20/14 1914  GLUCAP 102* 154* 148* 170*    Recent Results (from the past 240 hour(s))  Urine culture     Status: None (Preliminary result)    Collection Time: 03/19/14  3:04 PM  Result Value Ref Range Status   Specimen Description URINE, CATHETERIZED  Final   Special Requests NONE  Final   Culture  Setup Time   Final    03/20/2014 00:29 Performed at [facility] Count PENDING  Incomplete   Culture   Final    Culture reincubated for better growth Performed at [facility]    Report Status PENDING  Incomplete  Blood Culture (routine x 2)     Status: None (Preliminary result)   Collection Time: 03/19/14  3:23 PM  Result Value Ref Range Status   Specimen Description BLOOD RIGHT WRIST  Final   Special Requests BOTTLES DRAWN AEROBIC AND ANAEROBIC 5 ML  Final   Culture  Setup Time   Final    03/20/2014 07:48 Performed at [facility]    Culture   Final    STAPHYLOCOCCUS AUREUS Note: RIFAMPIN AND GENTAMICIN SHOULD NOT BE USED AS SINGLE DRUGS FOR TREATMENT OF STAPH INFECTIONS. Note: Gram Stain Report Called to,Read Back By and Verified With: FAYE S BY INGRAM A 03/20/14 11AM Performed at [facility]    Report Status PENDING  Incomplete  Blood Culture (routine x 2)     Status: None (Preliminary result)   Collection Time: 03/19/14  3:23 PM  Result Value Ref Range Status   Specimen Description BLOOD BLOOD LEFT FOREARM  Final   Special Requests BOTTLES DRAWN AEROBIC AND ANAEROBIC 5 ML  Final   Culture  Setup Time   Final    03/20/2014 07:51 Performed at [facility]    Culture   Final    STAPHYLOCOCCUS AUREUS Note: Gram Stain Report Called to,Read Back By and Verified With: FAYE S BY INGRAM A 03/20/14 11AM Performed at [facility]    Report Status PENDING  Incomplete         Studies: Dg Chest Port 1 View  03/21/2014   CLINICAL DATA:  Acute pulmonary edema. History of hypertension, diabetes and coronary artery disease. Initial encounter.  EXAM: PORTABLE CHEST - 1 VIEW  COMPARISON:  Radiographs 01/27/2014 and 03/20/2014.  FINDINGS: 1929 hr. The heart size and  mediastinal contours are stable. There is persistent elevation of the left hemidiaphragm with increased left basilar airspace disease. The right lung is clear. There is no pneumothorax or significant pleural effusion. No acute osseous findings are demonstrated. Degenerative changes are present throughout the thoracic spine and at both shoulders. Telemetry leads overlie the chest.  IMPRESSION: Progressive left lower lobe airspace disease. Although in part chronic, this could reflect superimposed aspiration. No overt pulmonary edema.   Electronically Signed   By: Roxy Horseman M.D.   On: 03/21/2014 08:10   Dg Chest Port 1 View  03/20/2014   CLINICAL DATA:  Fever and sepsis  EXAM: PORTABLE CHEST - 1 VIEW  COMPARISON:  March 19, 2014  study obtained earlier in the day ; January 27, 2014  FINDINGS: There is mild cardiomegaly with mild generalized interstitial edema. There is atelectatic change in the left base with small left effusion. Pulmonary vascularity is within normal limits. Bones are osteoporotic.  IMPRESSION: Cardiomegaly with interstitial edema. These findings are felt to represent a degree of congestive heart failure. Atelectasis left base. Small left effusion.   Electronically Signed   By: Bretta Bang M.D.   On: 03/20/2014 09:34   Dg Chest Port 1 View  (if Code Sepsis Called)  03/19/2014   CLINICAL DATA:  Failure to thrive  EXAM: PORTABLE CHEST - 1 VIEW  COMPARISON:  01/27/2014  FINDINGS: Patient is rotated considerably to the left and the study was not repeated. Study is significantly limited. Lungs are grossly clear.  IMPRESSION: Recommend repeat study. The patient is rotated considerably. Lungs are grossly clear.   Electronically Signed   By: Marlan Palau M.D.   On: 03/19/2014 14:51   Dg Knee Left Port  03/20/2014   CLINICAL DATA:  Bilateral foot can knee pain, initial evaluation, multiple open wounds due to bacterial infection, bilateral knee effusions  EXAM: PORTABLE LEFT KNEE - 1-2 VIEW   COMPARISON:  None.  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 The medial cortex of the medial tibial plateau demonstrates loss of definition. This is concerning for the possibility of osteomyelitis. The cortex along the medial margin of the distal medial femoral condyles is also indistinct. There is medial soft tissue swelling at the joint line.  IMPRESSION: The findings are concerning for cellulitis with underlying osteomyelitis medially.   Electronically Signed   By: Esperanza Heir M.D.   On: 03/20/2014 17:51   Dg Knee Right Port  03/20/2014   CLINICAL DATA:  Bilateral knee pain  EXAM: PORTABLE RIGHT KNEE - 1-2 VIEW  COMPARISON:  None.  FINDINGS: Severe tricompartmental degenerative change is noted. Small suprapatellar effusion. No fracture or dislocation. No radiopaque foreign body.  IMPRESSION: Severe tricompartmental degenerative change.   Electronically Signed   By: Christiana Pellant M.D.   On: 03/20/2014 17:51   Dg Foot Complete Left  03/20/2014   CLINICAL DATA:  Left heel wound  EXAM: LEFT FOOT - COMPLETE 3+ VIEW  COMPARISON:  None.  FINDINGS: There is extensive artifact from material overlying the foot which significantly decreases sensitivity and specificity for detection of osseous or soft tissue abnormalities. No grossly displaced fracture is identified. Bones are subjectively osteopenic. Dorsal soft tissue swelling is evident with plantar calcaneal spurring and vascular calcifications noted.  IMPRESSION: Suboptimal visualization due to support apparatus, but with nonspecific dorsal soft tissue swelling identified.   Electronically Signed   By: Christiana Pellant M.D.   On: 03/20/2014 17:07   Dg Foot Complete Right  03/20/2014   CLINICAL DATA:  Bilateral foot pain, open skin wound  EXAM: RIGHT FOOT COMPLETE - 3+ VIEW  COMPARISON:  None.  FINDINGS: Bones are subjectively osteopenic. Positioning is suboptimal due to patient immobility and portable technique.  Degenerative changes are noted at the midfoot. No fracture or dislocation allowing for positioning. Plantar calcaneal spurring noted. There is soft tissue swelling involving the dorsum of the foot and adjacent to the lateral malleolus. No radiopaque foreign body.  IMPRESSION: Subjective osteopenia without acute osseous abnormality.  Dorsal soft tissue swelling and swelling over the lateral malleolus. Correlate for cellulitis or skin abnormalities in this area.   Electronically Signed   By: Lucio Edward.D.  On: 03/20/2014 17:51        Scheduled Meds: . antiseptic oral rinse  7 mL Mouth Rinse q12n4p  . aspirin  81 mg Oral Daily  . atorvastatin  20 mg Oral q1800  . chlorhexidine  15 mL Mouth Rinse BID  . collagenase   Topical Daily  . feeding supplement (GLUCERNA SHAKE)  237 mL Oral TID BM  . fludrocortisone  0.1 mg Oral Daily  . hydrocortisone sod succinate (SOLU-CORTEF) inj  50 mg Intravenous Q6H  . insulin aspart  0-9 Units Subcutaneous TID WC  . potassium chloride  20 mEq Oral Q4H  . sodium chloride  3 mL Intravenous Q12H  . vancomycin  1,250 mg Intravenous Q12H   Continuous Infusions: . heparin 950 Units/hr (03/21/14 0921)  . phenylephrine (NEO-SYNEPHRINE) Adult infusion 15 mcg/min (03/21/14 0816)    Active Problems:   Sepsis   DM2 (diabetes mellitus, type 2)   Physical deconditioning   Hypoalbuminemia   Hyponatremia   Elevated troponin   Severe sepsis   Acute UTI   Dehydration with hyponatremia   Protein-calorie malnutrition, severe   Diabetic foot ulcer   Sacral decubitus ulcer   Acute pulmonary edema   Effusion into joint   ST elevation myocardial infarction (STEMI) of inferolateral wall, initial episode of care    Time spent: 60 minutes.    Marcellus Scott, MD, FACP, FHM. [facility] Pager [phone]  If 7PM-7AM, please contact night-coverage [email or web address] Password [email or web address] 03/21/2014, 10:47 AM    LOS: 2 days

## 2014-03-21 NOTE — Consult Note (Signed)
PULMONARY / CRITICAL CARE MEDICINE   Name: Robin Schroeder MRN: [id number] DOB: November 28, 1955    ADMISSION DATE:  03/19/2014 CONSULTATION DATE:  03/20/14  REFERRING MD :  Dr. Waymon Amato   CHIEF COMPLAINT:  Sepsis   INITIAL PRESENTATION: 58 y/o F with multiple medical problems and recent d/c in 01/2014 for MSSA bacteremia & sepsis admitted on 12/3 for decreased intake, concerns of intentional self harm and decubitus ulcers.  Persistently hypotensive and PCCM consulted for evaluation.   STUDIES:  12/04  ECHO >>   SIGNIFICANT EVENTS: 10/13-10/21/15  Admit with MSSA bacteremia.  NEG TEE for vegetations.  Work up + for RA.   10/30  Completed IV Cefazolin, PICC line d/c'd  11/02  Followed up with Dr. Drue Second, rx'd with 5mg  QD pred for RA sx  12/03  Admit with hypotension, decreased intake, concerns of intentional self harm (IVC'd by husband on admit) and decubitus ulcers. 12/04  Persistent hypotension, Stress steroids initiated and PCCM consulted for evaluation  SUBJECTIVE:  Remains hypotensive on   VITAL SIGNS: Temp:  [97.4 F (36.3 C)-99.2 F (37.3 C)] 97.8 F (36.6 C) (12/05 0805) Pulse Rate:  [51-94] 63 (12/05 1000) Resp:  [13-32] 18 (12/05 1000) BP: (81-121)/(38-68) 84/38 mmHg (12/05 1000) SpO2:  [90 %-99 %] 97 % (12/05 1000)   HEMODYNAMICS:     VENTILATOR SETTINGS:     INTAKE / OUTPUT:  Intake/Output Summary (Last 24 hours) at 03/21/14 1101 Last data filed at 03/21/14 0930  Gross per 24 hour  Intake 2955.38 ml  Output   1060 ml  Net 1895.38 ml    PHYSICAL EXAMINATION: General:  No distress, depressed affect Neuro:  nonfocal uppers, perrl HEENT:  Obese, no jvd Cardiovascular:  s1 s2 RRR, distant Lungs:  Coarse, cough clear Abdomen:  Echymosis, no r/g, soft, nontener Musculoskeletal:  Back no erythema cerv to lumbar Skin:  brusing inner thighs, knees bilateral, deformed rt leg laterally  LABS:  CBC  Recent Labs Lab 03/20/14 0108 03/20/14 0820  03/21/14 0357  WBC 3.3* 4.0 5.4  HGB 7.0* 8.8* 8.7*  HCT 21.9* 27.1* 26.9*  PLT 48* 63* 81*   Coag's  Recent Labs Lab 03/19/14 1508  INR 1.29   BMET  Recent Labs Lab 03/19/14 1508 03/19/14 1944 03/20/14 0108 03/21/14 0357  NA 121*  --  129* 135*  K 3.8  --  3.2* 3.7  CL 82*  --  98 105  CO2 28  --  22 21  BUN 24*  --  17 15  CREATININE 0.37* 0.32* 0.29* 0.25*  GLUCOSE 221*  --  150* 218*   Electrolytes  Recent Labs Lab 03/19/14 1508 03/20/14 0108 03/20/14 0820 03/21/14 0357  CALCIUM 7.8* 6.5*  --  7.4*  MG  --   --  1.6  --    Sepsis Markers  Recent Labs Lab 03/19/14 1542 03/19/14 1719 03/20/14 0820  LATICACIDVEN 2.52* 1.85 1.6   ABG  Recent Labs Lab 03/19/14 1523  PHART 7.484*  PCO2ART 40.8  PO2ART 78.7*   Liver Enzymes  Recent Labs Lab 03/19/14 1508 03/20/14 0108 03/21/14 0357  AST 46* 60* 72*  ALT 12 11 15   ALKPHOS 219* 157* 203*  BILITOT 1.6* 1.0 1.0  ALBUMIN 1.2* 0.9* 1.0*   Cardiac Enzymes  Recent Labs Lab 03/19/14 1944 03/20/14 0108 03/20/14 0820  TROPONINI 0.82* 7.66* >20.00*   Glucose  Recent Labs Lab 03/20/14 0809 03/20/14 1158 03/20/14 1541 03/20/14 1914  GLUCAP 102* 154* 148* 170*  Imaging Dg Chest Port 1 View  03/20/2014   CLINICAL DATA:  Fever and sepsis  EXAM: PORTABLE CHEST - 1 VIEW  COMPARISON:  March 19, 2014 study obtained earlier in the day ; January 27, 2014  FINDINGS: There is mild cardiomegaly with mild generalized interstitial edema. There is atelectatic change in the left base with small left effusion. Pulmonary vascularity is within normal limits. Bones are osteoporotic.  IMPRESSION: Cardiomegaly with interstitial edema. These findings are felt to represent a degree of congestive heart failure. Atelectasis left base. Small left effusion.   Electronically Signed   By: Bretta Bang M.D.   On: 03/20/2014 09:34   Dg Knee Left Port  03/20/2014   CLINICAL DATA:  Bilateral foot can knee pain,  initial evaluation, multiple open wounds due to bacterial infection, bilateral knee effusions  EXAM: PORTABLE LEFT KNEE - 1-2 VIEW  COMPARISON:  None.  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 The medial cortex of the medial tibial plateau demonstrates loss of definition. This is concerning for the possibility of osteomyelitis. The cortex along the medial margin of the distal medial femoral condyles is also indistinct. There is medial soft tissue swelling at the joint line.  IMPRESSION: The findings are concerning for cellulitis with underlying osteomyelitis medially.   Electronically Signed   By: Esperanza Heir M.D.   On: 03/20/2014 17:51   Dg Knee Right Port  03/20/2014   CLINICAL DATA:  Bilateral knee pain  EXAM: PORTABLE RIGHT KNEE - 1-2 VIEW  COMPARISON:  None.  FINDINGS: Severe tricompartmental degenerative change is noted. Small suprapatellar effusion. No fracture or dislocation. No radiopaque foreign body.  IMPRESSION: Severe tricompartmental degenerative change.   Electronically Signed   By: Christiana Pellant M.D.   On: 03/20/2014 17:51   Dg Foot Complete Left  03/20/2014   CLINICAL DATA:  Left heel wound  EXAM: LEFT FOOT - COMPLETE 3+ VIEW  COMPARISON:  None.  FINDINGS: There is extensive artifact from material overlying the foot which significantly decreases sensitivity and specificity for detection of osseous or soft tissue abnormalities. No grossly displaced fracture is identified. Bones are subjectively osteopenic. Dorsal soft tissue swelling is evident with plantar calcaneal spurring and vascular calcifications noted.  IMPRESSION: Suboptimal visualization due to support apparatus, but with nonspecific dorsal soft tissue swelling identified.   Electronically Signed   By: Christiana Pellant M.D.   On: 03/20/2014 17:07   Dg Foot Complete Right  03/20/2014   CLINICAL DATA:  Bilateral foot pain, open skin wound  EXAM: RIGHT FOOT COMPLETE - 3+ VIEW  COMPARISON:  None.   FINDINGS: Bones are subjectively osteopenic. Positioning is suboptimal due to patient immobility and portable technique. Degenerative changes are noted at the midfoot. No fracture or dislocation allowing for positioning. Plantar calcaneal spurring noted. There is soft tissue swelling involving the dorsum of the foot and adjacent to the lateral malleolus. No radiopaque foreign body.  IMPRESSION: Subjective osteopenia without acute osseous abnormality.  Dorsal soft tissue swelling and swelling over the lateral malleolus. Correlate for cellulitis or skin abnormalities in this area.   Electronically Signed   By: Christiana Pellant M.D.   On: 03/20/2014 17:51   ASSESSMENT / PLAN:  PULMONARY OETT n/a A: Mild Pulmonary Edema - noted on CXR, small effusion P:   Oxygen to support sats > 92% Pulmonary hygiene:  IS, Mobilize Over 7 liters up, refusing cvp line, kvo soon pCXR in am for volume status  CARDIOVASCULAR CVL n/a A:  Severe Sepsis / Septic Shock - in setting of probable bacteremia, GPC's in clusters on prelim BC's +/- component of adrenal insufficiency  Acute Coronary Syndrome - troponin > 20, EKG without change (minimal STE) HLD P:  Cardiology following Stress dose steroids adjust to 50 q6h Continue florinef oral Trend EKG Unable to receive heparin / ASA due to thrombocytopenia / anemia Assess ECHO for vegetation / myocarditis   Neosynephrine for MAP > 60 through peripheral line and patient is aware of risk. Lipitor   RENAL A:   Hyponatremia  Hypochloremia  hypoK AI P:   KVO IVF Echo Chem in am  Unable to assess cvp since patient is refusing central line placement  GASTROINTESTINAL A:   Protein Calorie Malnutrition  Nausea  P:   Nutrition consult appreciated Liberalize diet given code status change.  Ensure TID  Diet as tolerated  PRN zofran  HEMATOLOGIC A:   Severe Thrombocytopenia - sepsis / bacteremia Anemia P:  Trend CBC Tx for Hgb <8% (ACS) or active  bleeding   INFECTIOUS A:   Septic Shock - GPC's in clusters on prelim BC's  - source unclear, r/o bacteremia from endocarditis, decub (unlikely), joint involvement seeding? Decubitus Ulcers - sacral P:   BCx2 12/03 >> GPC's in clusters >> UC 12/03 >>  Abx: Vanco, start date 12/3>>> WOC consult  ID consult Echo important for veg now, may need TEE if neg TTE May need CT abdo/pelvis for seeding abscess ( clinically not supported) but would like hemodynamics to be more stable.  ENDOCRINE A:   Hyperglycemia  Adrenal Insufficiency  P:   Stress steroids  SSI Continue florinef  NEUROLOGIC A:   Chronic Pain Rheumatoid Arthritis  P:   Minimize narcotics as able Baseline 5mg  prednisone (started 02/16/14) - to stress roids Xray joints  FAMILY  - Updates: patient and husband bedside, discussed the case and discussed code status.  Patient clearly does not wish for aggressive interventions.  Not even a central line.  After discussion, will make patient LCB with pressors only and only neosynephrine up to 100 mcg given peripheral infusion.  - Inter-disciplinary family meet or Palliative Care meeting due by: 12/11  The patient is critically ill with multiple organ systems failure and requires high complexity decision making for assessment and support, frequent evaluation and titration of therapies, application of advanced monitoring technologies and extensive interpretation of multiple databases.   Critical Care Time devoted to patient care services described in this note is  35  Minutes. This time reflects time of care of this signee Dr 14/11. This critical care time does not reflect procedure time, or teaching time or supervisory time of PA/NP/Med student/Med Resident etc but could involve care discussion time.  Koren Bound, M.D. [facility] Pulmonary/Critical Care Medicine. Pager: [phone]. After hours pager: [phone].  03/21/2014, 11:01 AM

## 2014-03-21 NOTE — Consult Note (Signed)
[place] [facility] Consult   Reason for Consult:  Failure to thrive, possible suicidal ideation Referring Physician:  Dr. Riley Lam is an 58 y.o. female. Total Time spent with patient: 45 minutes  Assessment: AXIS I:  Depressive Disorder secondary to general medical condition AXIS II:  Deferred AXIS III:   Past Medical History  Diagnosis Date  . Hypertension   . Diabetes mellitus without complication   . Arthritis   . Coronary artery disease     pt unaware   AXIS IV:  other psychosocial or environmental problems AXIS V:  41-50 serious symptoms  Plan:  Patient does not meet criteria for psychiatric inpatient admission.  Subjective:   Robin Schroeder is a 58 y.o. female patient admitted with  sepsis, septic shock, ST EMI history of hypertension and congestive heart failure. The patient has had numerous medical illnesses over the past 1 year. 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 She's not a good historian but does admit that she's been feeling unable to function" like a burden to my family" she states that she has had absolute no appetite and has not wanted to eat. She was discharged from the hospital recently went to a skilled nursing facility and then home. While home she was not eating and refusing to try to even get out of bed.  The patient does not have any history of depression or any other psychiatric illness. She denies being suicidal but claims she just has no interest in eating. She claims she does not want to die and him imminently and will be compliant with the physician recommendations for treatment. She denies auditory or visual hallucinations or other psychotic symptoms. Given her overall depletion and low energy she probably would benefit from antidepressant treatment   HPI:  see above HPI Elements:   Location:  Global. Quality:  Severe. Severity:  Severe. Timing:  Several weeks. Duration:  One  year. Context:  Multisystem failure.  Past Psychiatric History: Past Medical History  Diagnosis Date  . Hypertension   . Diabetes mellitus without complication   . Arthritis   . Coronary artery disease     pt unaware    reports that she has never smoked. She does not have any smokeless tobacco history on file. She reports that she does not drink alcohol or use illicit drugs. Family History  Problem Relation Age of Onset  . Hypertension Brother      Living Arrangements: Spouse/significant other   Abuse/Neglect [facility]) Physical Abuse: Denies Verbal Abuse: Denies Sexual Abuse: Denies Allergies:   Allergies  Allergen Reactions  . Penicillins Rash     Objective: Blood pressure 109/66, pulse 76, temperature 97.9 F (36.6 C), temperature source Oral, resp. rate 18, height 5' 4"  (1.626 m), weight 215 lb (97.523 kg), SpO2 93 %.Body mass index is 36.89 kg/(m^2). Results for orders placed or performed during the hospital encounter of 03/19/14 (from the past 72 hour(s))  I-Stat CG4 Lactic Acid, ED     Status: Abnormal   Collection Time: 03/19/14  2:24 PM  Result Value Ref Range   Lactic Acid, Venous 2.53 (H) 0.5 - 2.2 mmol/L  Urinalysis, Routine w reflex microscopic     Status: Abnormal   Collection Time: 03/19/14  3:04 PM  Result Value Ref Range   Color, Urine AMBER (A) YELLOW    Comment: BIOCHEMICALS MAY BE AFFECTED BY COLOR   APPearance CLOUDY (A) CLEAR   Specific  Gravity, Urine 1.021 1.005 - 1.030   pH 5.0 5.0 - 8.0   Glucose, UA NEGATIVE NEGATIVE mg/dL   Hgb urine dipstick LARGE (A) NEGATIVE   Bilirubin Urine SMALL (A) NEGATIVE   Ketones, ur NEGATIVE NEGATIVE mg/dL   Protein, ur NEGATIVE NEGATIVE mg/dL   Urobilinogen, UA 2.0 (H) 0.0 - 1.0 mg/dL   Nitrite NEGATIVE NEGATIVE   Leukocytes, UA SMALL (A) NEGATIVE  Urine culture     Status: None (Preliminary result)   Collection Time: 03/19/14  3:04 PM  Result Value Ref Range   Specimen Description URINE, CATHETERIZED     Special Requests NONE    Culture  Setup Time      03/20/2014 00:29 Performed at [place] PENDING    Culture      Culture reincubated for better growth Performed at [facility]    Report Status PENDING   Urine microscopic-add on     Status: Abnormal   Collection Time: 03/19/14  3:04 PM  Result Value Ref Range   Squamous Epithelial / LPF MANY (A) RARE   WBC, UA 3-6 <3 WBC/hpf   RBC / HPF 7-10 <3 RBC/hpf   Bacteria, UA MANY (A) RARE   Casts GRANULAR CAST (A) NEGATIVE   Urine-Other MUCOUS PRESENT     Comment: AMORPHOUS URATES/PHOSPHATES  CBC WITH DIFFERENTIAL     Status: Abnormal   Collection Time: 03/19/14  3:08 PM  Result Value Ref Range   WBC 4.2 4.0 - 10.5 K/uL   RBC 2.93 (L) 3.87 - 5.11 MIL/uL   Hemoglobin 7.9 (L) 12.0 - 15.0 g/dL   HCT 24.7 (L) 36.0 - 46.0 %   MCV 84.3 78.0 - 100.0 fL   MCH 27.0 26.0 - 34.0 pg   MCHC 32.0 30.0 - 36.0 g/dL   RDW 17.0 (H) 11.5 - 15.5 %   Platelets 77 (L) 150 - 400 K/uL    Comment: REPEATED TO VERIFY SPECIMEN CHECKED FOR CLOTS PLATELET COUNT CONFIRMED BY SMEAR    Neutrophils Relative % 89 (H) 43 - 77 %   Lymphocytes Relative 8 (L) 12 - 46 %   Monocytes Relative 3 3 - 12 %   Eosinophils Relative 0 0 - 5 %   Basophils Relative 0 0 - 1 %   Neutro Abs 3.8 1.7 - 7.7 K/uL   Lymphs Abs 0.3 (L) 0.7 - 4.0 K/uL   Monocytes Absolute 0.1 0.1 - 1.0 K/uL   Eosinophils Absolute 0.0 0.0 - 0.7 K/uL   Basophils Absolute 0.0 0.0 - 0.1 K/uL   Smear Review MORPHOLOGY UNREMARKABLE   Comprehensive metabolic panel     Status: Abnormal   Collection Time: 03/19/14  3:08 PM  Result Value Ref Range   Sodium 121 (LL) 137 - 147 mEq/L    Comment: CRITICAL RESULT CALLED TO, READ BACK BY AND VERIFIED WITH: L JEFFRIE 1553 03/19/14 A NAVARRO    Potassium 3.8 3.7 - 5.3 mEq/L   Chloride 82 (L) 96 - 112 mEq/L   CO2 28 19 - 32 mEq/L   Glucose, Bld 221 (H) 70 - 99 mg/dL   BUN 24 (H) 6 - 23 mg/dL   Creatinine, Ser 0.37 (L) 0.50  - 1.10 mg/dL   Calcium 7.8 (L) 8.4 - 10.5 mg/dL   Total Protein 6.6 6.0 - 8.3 g/dL   Albumin 1.2 (L) 3.5 - 5.2 g/dL   AST 46 (H) 0 - 37 U/L   ALT 12 0 -  35 U/L   Alkaline Phosphatase 219 (H) 39 - 117 U/L   Total Bilirubin 1.6 (H) 0.3 - 1.2 mg/dL   GFR calc non Af Amer >90 >90 mL/min   GFR calc Af Amer >90 >90 mL/min    Comment: (NOTE) The eGFR has been calculated using the CKD EPI equation. This calculation has not been validated in all clinical situations. eGFR's persistently <90 mL/min signify possible Chronic Kidney Disease.    Anion gap 11 5 - 15  Protime-INR     Status: Abnormal   Collection Time: 03/19/14  3:08 PM  Result Value Ref Range   Prothrombin Time 16.2 (H) 11.6 - 15.2 seconds   INR 1.29 0.00 - 1.49  ABO/Rh     Status: None   Collection Time: 03/19/14  3:13 PM  Result Value Ref Range   ABO/RH(D) A POS   Blood Culture (routine x 2)     Status: None (Preliminary result)   Collection Time: 03/19/14  3:23 PM  Result Value Ref Range   Specimen Description BLOOD RIGHT WRIST    Special Requests BOTTLES DRAWN AEROBIC AND ANAEROBIC 5 ML    Culture  Setup Time      03/20/2014 07:48 Performed at [place] Note: RIFAMPIN AND GENTAMICIN SHOULD NOT BE USED AS SINGLE DRUGS FOR TREATMENT OF STAPH INFECTIONS. Note: Gram Stain Report Called to,Read Back By and Verified With: FAYE S BY INGRAM A 03/20/14 11AM Performed at [facility]    Report Status PENDING   Blood Culture (routine x 2)     Status: None (Preliminary result)   Collection Time: 03/19/14  3:23 PM  Result Value Ref Range   Specimen Description BLOOD BLOOD LEFT FOREARM    Special Requests BOTTLES DRAWN AEROBIC AND ANAEROBIC 5 ML    Culture  Setup Time      03/20/2014 07:51 Performed at [facility]    Culture      STAPHYLOCOCCUS AUREUS Note: Gram Stain Report Called to,Read Back By and Verified With: FAYE S BY INGRAM A 03/20/14 11AM Performed  at [facility]    Report Status PENDING   Blood gas, arterial ([facility] & [facility] ONLY)     Status: Abnormal   Collection Time: 03/19/14  3:23 PM  Result Value Ref Range   FIO2 0.21 %   pH, Arterial 7.484 (H) 7.350 - 7.450   pCO2 arterial 40.8 35.0 - 45.0 mmHg   pO2, Arterial 78.7 (L) 80.0 - 100.0 mmHg   Bicarbonate 29.4 (H) 20.0 - 24.0 mEq/L   TCO2 27.1 0 - 100 mmol/L   Acid-Base Excess 6.5 (H) 0.0 - 2.0 mmol/L   O2 Saturation 94.0 %   Patient temperature 103.5    Collection site LEFT RADIAL    Drawn by [id number]    Sample type ARTERIAL DRAW   Type and screen     Status: None   Collection Time: 03/19/14  3:23 PM  Result Value Ref Range   ABO/RH(D) A POS    Antibody Screen NEG    Sample Expiration 03/22/2014    Unit Number [id number]    Blood Component Type RED CELLS,LR    Unit division 00    Status of Unit ISSUED,FINAL    Transfusion Status OK TO TRANSFUSE    Crossmatch Result Compatible   I-stat troponin, ED (not at [facility])     Status: Abnormal   Collection Time: 03/19/14  3:38  PM  Result Value Ref Range   Troponin i, poc 0.16 (HH) 0.00 - 0.08 ng/mL   Comment NOTIFIED PHYSICIAN    Comment 3            Comment: Due to the release kinetics of cTnI, a negative result within the first hours of the onset of symptoms does not rule out myocardial infarction with certainty. If myocardial infarction is still suspected, repeat the test at appropriate intervals.   I-Stat CG4 Lactic Acid, ED     Status: Abnormal   Collection Time: 03/19/14  3:42 PM  Result Value Ref Range   Lactic Acid, Venous 2.52 (H) 0.5 - 2.2 mmol/L  I-Stat CG4 Lactic Acid, ED     Status: None   Collection Time: 03/19/14  5:19 PM  Result Value Ref Range   Lactic Acid, Venous 1.85 0.5 - 2.2 mmol/L  CBC     Status: Abnormal   Collection Time: 03/19/14  7:44 PM  Result Value Ref Range   WBC 4.3 4.0 - 10.5 K/uL   RBC 2.57 (L) 3.87 - 5.11 MIL/uL   Hemoglobin 7.1 (L) 12.0 - 15.0 g/dL   HCT 22.0 (L) 36.0 - 46.0  %   MCV 85.6 78.0 - 100.0 fL   MCH 27.6 26.0 - 34.0 pg   MCHC 32.3 30.0 - 36.0 g/dL   RDW 16.8 (H) 11.5 - 15.5 %   Platelets 52 (L) 150 - 400 K/uL    Comment: SPECIMEN CHECKED FOR CLOTS REPEATED TO VERIFY DELTA CHECK NOTED PLATELET COUNT CONFIRMED BY SMEAR   Creatinine, serum     Status: Abnormal   Collection Time: 03/19/14  7:44 PM  Result Value Ref Range   Creatinine, Ser 0.32 (L) 0.50 - 1.10 mg/dL   GFR calc non Af Amer >90 >90 mL/min   GFR calc Af Amer >90 >90 mL/min    Comment: (NOTE) The eGFR has been calculated using the CKD EPI equation. This calculation has not been validated in all clinical situations. eGFR's persistently <90 mL/min signify possible Chronic Kidney Disease.   Hemoglobin A1c     Status: Abnormal   Collection Time: 03/19/14  7:44 PM  Result Value Ref Range   Hgb A1c MFr Bld 7.0 (H) <5.7 %    Comment: (NOTE)                                                                       According to the ADA Clinical Practice Recommendations for 2011, when HbA1c is used as a screening test:  >=6.5%   Diagnostic of Diabetes Mellitus           (if abnormal result is confirmed) 5.7-6.4%   Increased risk of developing Diabetes Mellitus References:Diagnosis and Classification of Diabetes Mellitus,Diabetes PTWS,5681,27(NTZGY 1):S62-S69 and Standards of Medical Care in         Diabetes - 2011,Diabetes Care,2011,34 (Suppl 1):S11-S61.    Mean Plasma Glucose 154 (H) <117 mg/dL    Comment: Performed at [facility]  Troponin I (q 6hr x 3)     Status: Abnormal   Collection Time: 03/19/14  7:44 PM  Result Value Ref Range   Troponin I 0.82 (HH) <0.30 ng/mL    Comment:  Due to the release kinetics of cTnI, a negative result within the first hours of the onset of symptoms does not rule out myocardial infarction with certainty. If myocardial infarction is still suspected, repeat the test at appropriate intervals. CRITICAL RESULT CALLED TO, READ BACK BY AND  VERIFIED WITHAdalberto Ill RN 2120 03/19/14 A NAVARRO   Comprehensive metabolic panel     Status: Abnormal   Collection Time: 03/20/14  1:08 AM  Result Value Ref Range   Sodium 129 (L) 137 - 147 mEq/L    Comment: DELTA CHECK NOTED REPEATED TO VERIFY    Potassium 3.2 (L) 3.7 - 5.3 mEq/L   Chloride 98 96 - 112 mEq/L    Comment: DELTA CHECK NOTED REPEATED TO VERIFY    CO2 22 19 - 32 mEq/L   Glucose, Bld 150 (H) 70 - 99 mg/dL   BUN 17 6 - 23 mg/dL   Creatinine, Ser 0.29 (L) 0.50 - 1.10 mg/dL   Calcium 6.5 (L) 8.4 - 10.5 mg/dL   Total Protein 4.9 (L) 6.0 - 8.3 g/dL   Albumin 0.9 (L) 3.5 - 5.2 g/dL   AST 60 (H) 0 - 37 U/L   ALT 11 0 - 35 U/L   Alkaline Phosphatase 157 (H) 39 - 117 U/L   Total Bilirubin 1.0 0.3 - 1.2 mg/dL   GFR calc non Af Amer >90 >90 mL/min   GFR calc Af Amer >90 >90 mL/min    Comment: (NOTE) The eGFR has been calculated using the CKD EPI equation. This calculation has not been validated in all clinical situations. eGFR's persistently <90 mL/min signify possible Chronic Kidney Disease.    Anion gap 9 5 - 15  CBC     Status: Abnormal   Collection Time: 03/20/14  1:08 AM  Result Value Ref Range   WBC 3.3 (L) 4.0 - 10.5 K/uL   RBC 2.55 (L) 3.87 - 5.11 MIL/uL   Hemoglobin 7.0 (L) 12.0 - 15.0 g/dL   HCT 21.9 (L) 36.0 - 46.0 %   MCV 85.9 78.0 - 100.0 fL   MCH 27.5 26.0 - 34.0 pg   MCHC 32.0 30.0 - 36.0 g/dL   RDW 16.8 (H) 11.5 - 15.5 %   Platelets 48 (L) 150 - 400 K/uL    Comment: CONSISTENT WITH PREVIOUS RESULT  Troponin I (q 6hr x 3)     Status: Abnormal   Collection Time: 03/20/14  1:08 AM  Result Value Ref Range   Troponin I 7.66 (HH) <0.30 ng/mL    Comment:        Due to the release kinetics of cTnI, a negative result within the first hours of the onset of symptoms does not rule out myocardial infarction with certainty. If myocardial infarction is still suspected, repeat the test at appropriate intervals. CRITICAL RESULT CALLED TO, READ BACK BY  AND VERIFIED WITH: [facility] RN AT 0156 12.04.15 BY TIBBITTS,K   Prepare RBC     Status: None   Collection Time: 03/20/14  3:00 AM  Result Value Ref Range   Order Confirmation ORDER PROCESSED BY BLOOD BANK   Glucose, capillary     Status: Abnormal   Collection Time: 03/20/14  8:09 AM  Result Value Ref Range   Glucose-Capillary 102 (H) 70 - 99 mg/dL  Troponin I (q 6hr x 3)     Status: Abnormal   Collection Time: 03/20/14  8:20 AM  Result Value Ref Range   Troponin I >20.00 (HH) <0.30 ng/mL  Comment:        Due to the release kinetics of cTnI, a negative result within the first hours of the onset of symptoms does not rule out myocardial infarction with certainty. If myocardial infarction is still suspected, repeat the test at appropriate intervals. CRITICAL RESULT CALLED TO, READ BACK BY AND VERIFIED WITH: SCHAFFER,S. RN AT 1856 03/20/14 MULLINS,T   CBC     Status: Abnormal   Collection Time: 03/20/14  8:20 AM  Result Value Ref Range   WBC 4.0 4.0 - 10.5 K/uL   RBC 3.23 (L) 3.87 - 5.11 MIL/uL   Hemoglobin 8.8 (L) 12.0 - 15.0 g/dL    Comment: REPEATED TO VERIFY DELTA CHECK NOTED POST TRANSFUSION SPECIMEN    HCT 27.1 (L) 36.0 - 46.0 %   MCV 83.9 78.0 - 100.0 fL   MCH 27.2 26.0 - 34.0 pg   MCHC 32.5 30.0 - 36.0 g/dL   RDW 16.4 (H) 11.5 - 15.5 %   Platelets 63 (L) 150 - 400 K/uL    Comment: REPEATED TO VERIFY DELTA CHECK NOTED RESULT CHECKED   Cortisol     Status: None   Collection Time: 03/20/14  8:20 AM  Result Value Ref Range   Cortisol, Plasma 31.5 ug/dL    Comment: (NOTE) AM:  4.3 - 22.4 ug/dL PM:  3.1 - 16.7 ug/dL Performed at [facility]   Lactic acid, plasma     Status: None   Collection Time: 03/20/14  8:20 AM  Result Value Ref Range   Lactic Acid, Venous 1.6 0.5 - 2.2 mmol/L  Magnesium     Status: None   Collection Time: 03/20/14  8:20 AM  Result Value Ref Range   Magnesium 1.6 1.5 - 2.5 mg/dL  Glucose, capillary     Status: Abnormal    Collection Time: 03/20/14 11:58 AM  Result Value Ref Range   Glucose-Capillary 154 (H) 70 - 99 mg/dL   Comment 1 Documented in Chart    Comment 2 Notify RN   Glucose, capillary     Status: Abnormal   Collection Time: 03/20/14  3:41 PM  Result Value Ref Range   Glucose-Capillary 148 (H) 70 - 99 mg/dL   Comment 1 Documented in Chart    Comment 2 Notify RN   Glucose, capillary     Status: Abnormal   Collection Time: 03/20/14  7:14 PM  Result Value Ref Range   Glucose-Capillary 170 (H) 70 - 99 mg/dL   Comment 1 Documented in Chart    Comment 2 Notify RN   Comprehensive metabolic panel     Status: Abnormal   Collection Time: 03/21/14  3:57 AM  Result Value Ref Range   Sodium 135 (L) 137 - 147 mEq/L   Potassium 3.7 3.7 - 5.3 mEq/L   Chloride 105 96 - 112 mEq/L   CO2 21 19 - 32 mEq/L   Glucose, Bld 218 (H) 70 - 99 mg/dL   BUN 15 6 - 23 mg/dL   Creatinine, Ser 0.25 (L) 0.50 - 1.10 mg/dL   Calcium 7.4 (L) 8.4 - 10.5 mg/dL   Total Protein 5.4 (L) 6.0 - 8.3 g/dL   Albumin 1.0 (L) 3.5 - 5.2 g/dL   AST 72 (H) 0 - 37 U/L   ALT 15 0 - 35 U/L   Alkaline Phosphatase 203 (H) 39 - 117 U/L   Total Bilirubin 1.0 0.3 - 1.2 mg/dL   GFR calc non Af Amer >90 >90 mL/min   GFR  calc Af Amer >90 >90 mL/min    Comment: (NOTE) The eGFR has been calculated using the CKD EPI equation. This calculation has not been validated in all clinical situations. eGFR's persistently <90 mL/min signify possible Chronic Kidney Disease.    Anion gap 9 5 - 15  Hemoglobin A1c     Status: Abnormal   Collection Time: 03/21/14  3:57 AM  Result Value Ref Range   Hgb A1c MFr Bld 6.8 (H) <5.7 %    Comment: (NOTE)                                                                       According to the ADA Clinical Practice Recommendations for 2011, when HbA1c is used as a screening test:  >=6.5%   Diagnostic of Diabetes Mellitus           (if abnormal result is confirmed) 5.7-6.4%   Increased risk of developing Diabetes  [place] References:Diagnosis and Classification of Diabetes Mellitus,Diabetes OMAY,0459,97(FSFSE 1):S62-S69 and Standards of Medical Care in         Diabetes - 2011,Diabetes Care,2011,34 (Suppl 1):S11-S61.    Mean Plasma Glucose 148 (H) <117 mg/dL    Comment: Performed at [facility]  Sedimentation rate     Status: Abnormal   Collection Time: 03/21/14  3:57 AM  Result Value Ref Range   Sed Rate 90 (H) 0 - 22 mm/hr  CBC with Differential     Status: Abnormal   Collection Time: 03/21/14  3:57 AM  Result Value Ref Range   WBC 5.4 4.0 - 10.5 K/uL   RBC 3.19 (L) 3.87 - 5.11 MIL/uL   Hemoglobin 8.7 (L) 12.0 - 15.0 g/dL   HCT 26.9 (L) 36.0 - 46.0 %   MCV 84.3 78.0 - 100.0 fL   MCH 27.3 26.0 - 34.0 pg   MCHC 32.3 30.0 - 36.0 g/dL   RDW 16.9 (H) 11.5 - 15.5 %   Platelets 81 (L) 150 - 400 K/uL    Comment: REPEATED TO VERIFY SPECIMEN CHECKED FOR CLOTS CONSISTENT WITH PREVIOUS RESULT    Neutrophils Relative % 82 (H) 43 - 77 %   Neutro Abs 4.4 1.7 - 7.7 K/uL   Lymphocytes Relative 13 12 - 46 %   Lymphs Abs 0.7 0.7 - 4.0 K/uL   Monocytes Relative 5 3 - 12 %   Monocytes Absolute 0.3 0.1 - 1.0 K/uL   Eosinophils Relative 0 0 - 5 %   Eosinophils Absolute 0.0 0.0 - 0.7 K/uL   Basophils Relative 0 0 - 1 %   Basophils Absolute 0.0 0.0 - 0.1 K/uL  Protime-INR     Status: Abnormal   Collection Time: 03/21/14  9:54 AM  Result Value Ref Range   Prothrombin Time 15.3 (H) 11.6 - 15.2 seconds   INR 1.20 0.00 - 1.49   Labs are reviewed and are pertinent for staph infection  Current Facility-Administered Medications  Medication Dose Route Frequency Provider Last Rate Last Dose  . 0.9 %  sodium chloride infusion  250 mL Intravenous PRN Oswald Hillock, MD      . acetaminophen (TYLENOL) tablet 650 mg  650 mg Oral Q6H PRN Oswald Hillock, MD  Or  . acetaminophen (TYLENOL) suppository 650 mg  650 mg Rectal Q6H PRN Oswald Hillock, MD      . antiseptic oral rinse (CPC / CETYLPYRIDINIUM  CHLORIDE 0.05%) solution 7 mL  7 mL Mouth Rinse q12n4p Modena Jansky, MD   7 mL at 03/21/14 1212  . aspirin chewable tablet 81 mg  81 mg Oral Daily Pixie Casino, MD   81 mg at 03/21/14 0840  . atorvastatin (LIPITOR) tablet 20 mg  20 mg Oral q1800 Dorothy Spark, MD   20 mg at 03/20/14 1041  . ceFAZolin (ANCEF) IVPB 2 g/50 mL premix  2 g Intravenous Q8H Mosetta Pigeon, RPH   2 g at 03/21/14 1254  . chlorhexidine (PERIDEX) 0.12 % solution 15 mL  15 mL Mouth Rinse BID Modena Jansky, MD   15 mL at 03/21/14 0842  . collagenase (SANTYL) ointment   Topical Daily Modena Jansky, MD      . feeding supplement (GLUCERNA SHAKE) (GLUCERNA SHAKE) liquid 237 mL  237 mL Oral TID BM Hazle Coca, RD   237 mL at 03/21/14 1059  . fludrocortisone (FLORINEF) tablet 0.1 mg  0.1 mg Oral Daily Raylene Miyamoto, MD   0.1 mg at 03/21/14 1054  . heparin ADULT infusion 100 units/mL (25000 units/250 mL)  950 Units/hr Intravenous Continuous Mosetta Pigeon, RPH 9.5 mL/hr at 03/21/14 0921 950 Units/hr at 03/21/14 0921  . hydrocortisone sodium succinate (SOLU-CORTEF) 100 MG injection 50 mg  50 mg Intravenous Q6H Raylene Miyamoto, MD   50 mg at 03/21/14 1049  . insulin aspart (novoLOG) injection 0-9 Units  0-9 Units Subcutaneous TID WC Oswald Hillock, MD   3 Units at 03/21/14 1253  . ondansetron (ZOFRAN) tablet 4 mg  4 mg Oral Q6H PRN Oswald Hillock, MD   4 mg at 03/21/14 1352   Or  . ondansetron (ZOFRAN) injection 4 mg  4 mg Intravenous Q6H PRN Oswald Hillock, MD      . phenylephrine (NEO-SYNEPHRINE) 10 mg in dextrose 5 % 250 mL (0.04 mg/mL) infusion  30-200 mcg/min Intravenous Titrated Raylene Miyamoto, MD 75 mL/hr at 03/21/14 1352 50 mcg/min at 03/21/14 1352  . potassium chloride SA (K-DUR,KLOR-CON) 20 MEQ CR tablet           . sodium chloride 0.9 % injection 3 mL  3 mL Intravenous Q12H Oswald Hillock, MD   3 mL at 03/21/14 1100  . sodium chloride 0.9 % injection 3 mL  3 mL Intravenous PRN Oswald Hillock, MD      .  vancomycin (VANCOCIN) 1 GM/200ML IVPB           . vancomycin (VANCOCIN) 1,250 mg in sodium chloride 0.9 % 250 mL IVPB  1,250 mg Intravenous Q12H Emiliano Dyer, RPH   1,250 mg at 03/21/14 1103    Psychiatric Specialty Exam:     Blood pressure 109/66, pulse 76, temperature 97.9 F (36.6 C), temperature source Oral, resp. rate 18, height 5' 4"  (1.626 m), weight 215 lb (97.523 kg), SpO2 93 %.Body mass index is 36.89 kg/(m^2).  General Appearance: Casual  Eye Contact::  Fair  Speech:  Clear and Coherent  Volume:  Increased  Mood:  Angry and Depressed  Affect:  Constricted and Depressed  Thought Process:  Loose  Orientation:  Full (Time, Place, and Person)  Thought Content:  Rumination  Suicidal Thoughts:  No  Homicidal Thoughts:  No  Memory:  Immediate;  Poor Recent;   Poor Remote;   Poor  Judgement:  Impaired  Insight:  Fair  Psychomotor Activity:  Decreased  Concentration:  Poor  Recall:  Poor  Fund of Knowledge:Fair  Language: Fair  Akathisia:  No  Handed:  Right  AIMS (if indicated):     Assets:  Social Support  Sleep:      Musculoskeletal: Strength & Muscle Tone: decreased Gait & Station: unable to stand Patient leans: N/A  Treatment Plan Summary: Daily contact with patient to assess and evaluate symptoms and progress in treatment Medication management will add Remeron, antidepressant to help with appetite and depressive mood. At this time she seems willing to undergo recommended medical treatments. We'll continue to follow  ROSS, [facility] 03/21/2014 3:30 PM

## 2014-03-22 DIAGNOSIS — L97429 Non-pressure chronic ulcer of left heel and midfoot with unspecified severity: Secondary | ICD-10-CM | POA: Insufficient documentation

## 2014-03-22 DIAGNOSIS — A4101 Sepsis due to Methicillin susceptible Staphylococcus aureus: Principal | ICD-10-CM

## 2014-03-22 DIAGNOSIS — M86159 Other acute osteomyelitis, unspecified femur: Secondary | ICD-10-CM

## 2014-03-22 DIAGNOSIS — I213 ST elevation (STEMI) myocardial infarction of unspecified site: Secondary | ICD-10-CM | POA: Diagnosis present

## 2014-03-22 DIAGNOSIS — M86152 Other acute osteomyelitis, left femur: Secondary | ICD-10-CM

## 2014-03-22 LAB — BASIC METABOLIC PANEL
Anion gap: 12 (ref 5–15)
BUN: 24 mg/dL — ABNORMAL HIGH (ref 6–23)
CALCIUM: 7.4 mg/dL — AB (ref 8.4–10.5)
CO2: 18 mEq/L — ABNORMAL LOW (ref 19–32)
Chloride: 103 mEq/L (ref 96–112)
Creatinine, Ser: 0.48 mg/dL — ABNORMAL LOW (ref 0.50–1.10)
GFR calc Af Amer: >90 mL/min (ref >90)
GFR calc non Af Amer: >90 mL/min (ref >90)
GLUCOSE: 276 mg/dL — AB (ref 70–99)
POTASSIUM: 4 meq/L (ref 3.7–5.3)
Sodium: 133 mEq/L — ABNORMAL LOW (ref 137–147)

## 2014-03-22 LAB — CBC
HEMATOCRIT: 30.2 % — AB (ref 36.0–46.0)
Hemoglobin: 9.7 g/dL — ABNORMAL LOW (ref 12.0–15.0)
MCH: 27.1 pg (ref 26.0–34.0)
MCHC: 32.1 g/dL (ref 30.0–36.0)
MCV: 84.4 fL (ref 78.0–100.0)
Platelets: 113 10*3/uL — ABNORMAL LOW (ref 150–400)
RBC: 3.58 MIL/uL — ABNORMAL LOW (ref 3.87–5.11)
RDW: 16.7 % — ABNORMAL HIGH (ref 11.5–15.5)
WBC: 6.7 10*3/uL (ref 4.0–10.5)

## 2014-03-22 LAB — CULTURE, BLOOD (ROUTINE X 2)

## 2014-03-22 LAB — GLUCOSE, CAPILLARY
Glucose-Capillary: 258 mg/dL — ABNORMAL HIGH (ref 70–99)
Glucose-Capillary: 250 mg/dL — ABNORMAL HIGH (ref 70–99)

## 2014-03-22 LAB — HEPARIN LEVEL (UNFRACTIONATED)
HEPARIN UNFRACTIONATED: 0.51 [IU]/mL (ref 0.30–0.70)
Heparin Unfractionated: 0.47 IU/mL (ref 0.30–0.70)

## 2014-03-22 LAB — VANCOMYCIN, TROUGH: Vancomycin Tr: 32.5 ug/mL (ref 10.0–20.0)

## 2014-03-22 LAB — MAGNESIUM: Magnesium: 1.8 mg/dL (ref 1.5–2.5)

## 2014-03-22 LAB — PHOSPHORUS: PHOSPHORUS: 1.9 mg/dL — AB (ref 2.3–4.6)

## 2014-03-22 MED ORDER — FUROSEMIDE 10 MG/ML IJ SOLN
40.0000 mg | Freq: Once | INTRAMUSCULAR | Status: AC
  Administered 2014-03-22: 40 mg via INTRAVENOUS
  Filled 2014-03-22: qty 4

## 2014-03-22 MED ORDER — INSULIN ASPART 100 UNIT/ML ~~LOC~~ SOLN
0.0000 [IU] | Freq: Every day | SUBCUTANEOUS | Status: DC
  Administered 2014-03-22: 3 [IU] via SUBCUTANEOUS

## 2014-03-22 MED ORDER — INSULIN GLARGINE 100 UNIT/ML ~~LOC~~ SOLN
10.0000 [IU] | Freq: Every day | SUBCUTANEOUS | Status: DC
  Administered 2014-03-22: 10 [IU] via SUBCUTANEOUS
  Filled 2014-03-22 (×2): qty 0.1

## 2014-03-22 MED ORDER — SODIUM CHLORIDE 0.9 % IV BOLUS (SEPSIS)
1000.0000 mL | Freq: Once | INTRAVENOUS | Status: AC
  Administered 2014-03-22: 1000 mL via INTRAVENOUS

## 2014-03-22 MED ORDER — HEPARIN (PORCINE) IN NACL 100-0.45 UNIT/ML-% IJ SOLN
1450.0000 [IU]/h | INTRAMUSCULAR | Status: DC
  Administered 2014-03-22: 1450 [IU]/h via INTRAVENOUS
  Filled 2014-03-22 (×3): qty 250

## 2014-03-22 MED ORDER — INSULIN ASPART 100 UNIT/ML ~~LOC~~ SOLN
0.0000 [IU] | Freq: Three times a day (TID) | SUBCUTANEOUS | Status: DC
  Administered 2014-03-22 (×2): 5 [IU] via SUBCUTANEOUS
  Administered 2014-03-23: 2 [IU] via SUBCUTANEOUS
  Administered 2014-03-23 (×2): 3 [IU] via SUBCUTANEOUS
  Administered 2014-03-24 (×2): 2 [IU] via SUBCUTANEOUS
  Administered 2014-03-25: 1 [IU] via SUBCUTANEOUS

## 2014-03-22 MED ORDER — HYDROCOD POLST-CHLORPHEN POLST 10-8 MG/5ML PO LQCR
5.0000 mL | Freq: Two times a day (BID) | ORAL | Status: DC | PRN
  Administered 2014-03-22: 5 mL via ORAL
  Filled 2014-03-22: qty 5

## 2014-03-22 NOTE — Progress Notes (Signed)
Inpatient Diabetes Program Recommendations  AACE/ADA: New Consensus Statement on Inpatient Glycemic Control (2013)  Target Ranges:  Prepandial:   less than 140 mg/dL      Peak postprandial:   less than 180 mg/dL (1-2 hours)      Critically ill patients:  140 - 180 mg/dL   Reason for Visit: Diabetes Consult  Diabetes history: DM2 Outpatient Diabetes medications: None Current orders for Inpatient glycemic control: Lantus 10 units QHS, Novolog sensitive tidwc and hs  58 year old female with multiple medical problems who was discharged from the hospital in October after she was treated for MSSA bacteremia with sepsis. As per patient's husband, patient did not want to eat or drink and she is not eating and drinking for past 5 days and so he got concerned that she wants to hurt herself. So he filled out IVC and patient was brought by the EMS. On Prednisone 10 mg QD at home.  Inpatient Diabetes Program Recommendations Insulin - Basal: To start Lantus 10 QHS tonight - Titrate until FBS <180 mg/dL Correction (SSI): Increase Novolog to resistant tidwc and hs HgbA1C: 6.8% - ? accuracy with low H/H  Note: Will continue to follow. Thank you. Ailene Ards, RD, LDN, CDE Inpatient Diabetes Coordinator [phone]

## 2014-03-22 NOTE — Progress Notes (Signed)
Chaplain visited at rounding and found Ms Robin Schroeder asleep. I am told by staff she has been agitated during the night and some of her anxiety has returned.   Page chaplain as requested or needed  Benjie Karvonen. Horace Wishon

## 2014-03-22 NOTE — Consult Note (Signed)
[facility] [place] Consult   Reason for Consult:  Failure to thrive, possible suicidal ideation Referring Physician:  Dr. Riley Lam is an 58 y.o. female. Total Time spent with patient:20 min  Assessment: AXIS I:  Depressive Disorder secondary to general medical condition AXIS II:  Deferred AXIS III:   Past Medical History  Diagnosis Date  . Hypertension   . Diabetes mellitus without complication   . Arthritis   . Coronary artery disease     pt unaware   AXIS IV:  other psychosocial or environmental problems AXIS V:  41-50 serious symptoms  Plan:  Patient does not meet criteria for psychiatric inpatient admission.  Subjective:   Robin Schroeder is a 58 y.o. female patient admitted with  sepsis, septic shock, ST EMI history of hypertension and congestive heart failure. The patient has had numerous medical illnesses over the past 1 year. 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 She's not a good historian but does admit that she's been feeling unable to function" like a burden to my family" she states that she has had absolute no appetite and has not wanted to eat. She was discharged from the hospital recently went to a skilled nursing facility and then home. While home she was not eating and refusing to try to even get out of bed.  The patient does not have any history of depression or any other psychiatric illness. She denies being suicidal but claims she just has no interest in eating. She claims she does not want to die and him imminently and will be compliant with the physician recommendations for treatment. She denies auditory or visual hallucinations or other psychotic symptoms. Given her overall depletion and low energy she probably would benefit from antidepressant treatment   Patient seen again today 03/22/2014. Her sister is at her bedside. She claims she ate better today and is feeling better. She shifts her eyes however  when I come in and doesn't seem to willing to speak. Her sister states that she was talking "nonstop" before I came in. She denies any suicidal ideation and her thought process is organized  HPI:  see above HPI Elements:   Location:  Global. Quality:  Severe. Severity:  Severe. Timing:  Several weeks. Duration:  One year. Context:  Multisystem failure.  Past Psychiatric History: Past Medical History  Diagnosis Date  . Hypertension   . Diabetes mellitus without complication   . Arthritis   . Coronary artery disease     pt unaware    reports that she has never smoked. She does not have any smokeless tobacco history on file. She reports that she does not drink alcohol or use illicit drugs. Family History  Problem Relation Age of Onset  . Hypertension Brother      Living Arrangements: Spouse/significant other   Abuse/Neglect [facility]) Physical Abuse: Denies Verbal Abuse: Denies Sexual Abuse: Denies Allergies:   Allergies  Allergen Reactions  . Penicillins Rash     Objective: Blood pressure 135/68, pulse 65, temperature 97.7 F (36.5 C), temperature source Oral, resp. rate 24, height 5' 4"  (1.626 m), weight 194 lb 3.6 oz (88.1 kg), SpO2 99 %.Body mass index is 33.32 kg/(m^2). Results for orders placed or performed during the hospital encounter of 03/19/14 (from the past 72 hour(s))  I-Stat CG4 Lactic Acid, ED     Status: Abnormal   Collection Time: 03/19/14  2:24 PM  Result Value Ref Range  Lactic Acid, Venous 2.53 (H) 0.5 - 2.2 mmol/L  Urinalysis, Routine w reflex microscopic     Status: Abnormal   Collection Time: 03/19/14  3:04 PM  Result Value Ref Range   Color, Urine AMBER (A) YELLOW    Comment: BIOCHEMICALS MAY BE AFFECTED BY COLOR   APPearance CLOUDY (A) CLEAR   Specific Gravity, Urine 1.021 1.005 - 1.030   pH 5.0 5.0 - 8.0   Glucose, UA NEGATIVE NEGATIVE mg/dL   Hgb urine dipstick LARGE (A) NEGATIVE   Bilirubin Urine SMALL (A) NEGATIVE   Ketones, ur NEGATIVE  NEGATIVE mg/dL   Protein, ur NEGATIVE NEGATIVE mg/dL   Urobilinogen, UA 2.0 (H) 0.0 - 1.0 mg/dL   Nitrite NEGATIVE NEGATIVE   Leukocytes, UA SMALL (A) NEGATIVE  Urine culture     Status: None (Preliminary result)   Collection Time: 03/19/14  3:04 PM  Result Value Ref Range   Specimen Description URINE, CATHETERIZED    Special Requests NONE    Culture  Setup Time      03/20/2014 00:29 Performed at [place]      >=100,000 COLONIES/ML Performed at [place] Performed at [facility]    Report Status PENDING   Urine microscopic-add on     Status: Abnormal   Collection Time: 03/19/14  3:04 PM  Result Value Ref Range   Squamous Epithelial / LPF MANY (A) RARE   WBC, UA 3-6 <3 WBC/hpf   RBC / HPF 7-10 <3 RBC/hpf   Bacteria, UA MANY (A) RARE   Casts GRANULAR CAST (A) NEGATIVE   Urine-Other MUCOUS PRESENT     Comment: AMORPHOUS URATES/PHOSPHATES  CBC WITH DIFFERENTIAL     Status: Abnormal   Collection Time: 03/19/14  3:08 PM  Result Value Ref Range   WBC 4.2 4.0 - 10.5 K/uL   RBC 2.93 (L) 3.87 - 5.11 MIL/uL   Hemoglobin 7.9 (L) 12.0 - 15.0 g/dL   HCT 24.7 (L) 36.0 - 46.0 %   MCV 84.3 78.0 - 100.0 fL   MCH 27.0 26.0 - 34.0 pg   MCHC 32.0 30.0 - 36.0 g/dL   RDW 17.0 (H) 11.5 - 15.5 %   Platelets 77 (L) 150 - 400 K/uL    Comment: REPEATED TO VERIFY SPECIMEN CHECKED FOR CLOTS PLATELET COUNT CONFIRMED BY SMEAR    Neutrophils Relative % 89 (H) 43 - 77 %   Lymphocytes Relative 8 (L) 12 - 46 %   Monocytes Relative 3 3 - 12 %   Eosinophils Relative 0 0 - 5 %   Basophils Relative 0 0 - 1 %   Neutro Abs 3.8 1.7 - 7.7 K/uL   Lymphs Abs 0.3 (L) 0.7 - 4.0 K/uL   Monocytes Absolute 0.1 0.1 - 1.0 K/uL   Eosinophils Absolute 0.0 0.0 - 0.7 K/uL   Basophils Absolute 0.0 0.0 - 0.1 K/uL   Smear Review MORPHOLOGY UNREMARKABLE   Comprehensive metabolic panel     Status: Abnormal   Collection Time: 03/19/14   3:08 PM  Result Value Ref Range   Sodium 121 (LL) 137 - 147 mEq/L    Comment: CRITICAL RESULT CALLED TO, READ BACK BY AND VERIFIED WITH: L JEFFRIE 1553 03/19/14 A NAVARRO    Potassium 3.8 3.7 - 5.3 mEq/L   Chloride 82 (L) 96 - 112 mEq/L   CO2 28 19 - 32 mEq/L  Glucose, Bld 221 (H) 70 - 99 mg/dL   BUN 24 (H) 6 - 23 mg/dL   Creatinine, Ser 0.37 (L) 0.50 - 1.10 mg/dL   Calcium 7.8 (L) 8.4 - 10.5 mg/dL   Total Protein 6.6 6.0 - 8.3 g/dL   Albumin 1.2 (L) 3.5 - 5.2 g/dL   AST 46 (H) 0 - 37 U/L   ALT 12 0 - 35 U/L   Alkaline Phosphatase 219 (H) 39 - 117 U/L   Total Bilirubin 1.6 (H) 0.3 - 1.2 mg/dL   GFR calc non Af Amer >90 >90 mL/min   GFR calc Af Amer >90 >90 mL/min    Comment: (NOTE) The eGFR has been calculated using the CKD EPI equation. This calculation has not been validated in all clinical situations. eGFR's persistently <90 mL/min signify possible Chronic Kidney Disease.    Anion gap 11 5 - 15  Protime-INR     Status: Abnormal   Collection Time: 03/19/14  3:08 PM  Result Value Ref Range   Prothrombin Time 16.2 (H) 11.6 - 15.2 seconds   INR 1.29 0.00 - 1.49  ABO/Rh     Status: None   Collection Time: 03/19/14  3:13 PM  Result Value Ref Range   ABO/RH(D) A POS   Blood Culture (routine x 2)     Status: None   Collection Time: 03/19/14  3:23 PM  Result Value Ref Range   Specimen Description BLOOD RIGHT WRIST    Special Requests BOTTLES DRAWN AEROBIC AND ANAEROBIC 5 ML    Culture  Setup Time      03/20/2014 07:48 Performed at [facility] Note: RIFAMPIN AND GENTAMICIN SHOULD NOT BE USED AS SINGLE DRUGS FOR TREATMENT OF STAPH INFECTIONS. Note: Gram Stain Report Called to,Read Back By and Verified With: FAYE S BY INGRAM A 03/20/14 11AM Performed at [facility]    Report Status 03/22/2014 FINAL    Organism ID, Bacteria STAPHYLOCOCCUS AUREUS       Susceptibility   Staphylococcus aureus - MIC*    CLINDAMYCIN  <=0.25 SENSITIVE Sensitive     ERYTHROMYCIN <=0.25 SENSITIVE Sensitive     GENTAMICIN <=0.5 SENSITIVE Sensitive     LEVOFLOXACIN <=0.12 SENSITIVE Sensitive     OXACILLIN 0.5 SENSITIVE Sensitive     PENICILLIN RESISTANT      RIFAMPIN <=0.5 SENSITIVE Sensitive     TRIMETH/SULFA <=10 SENSITIVE Sensitive     VANCOMYCIN <=0.5 SENSITIVE Sensitive     TETRACYCLINE <=1 SENSITIVE Sensitive     MOXIFLOXACIN <=0.25 SENSITIVE Sensitive     * STAPHYLOCOCCUS AUREUS  Blood Culture (routine x 2)     Status: None   Collection Time: 03/19/14  3:23 PM  Result Value Ref Range   Specimen Description BLOOD BLOOD LEFT FOREARM    Special Requests BOTTLES DRAWN AEROBIC AND ANAEROBIC 5 ML    Culture  Setup Time      03/20/2014 07:51 Performed at [facility]    Culture      STAPHYLOCOCCUS AUREUS Note: SUSCEPTIBILITIES PERFORMED ON PREVIOUS CULTURE WITHIN THE LAST 5 DAYS. Note: Gram Stain Report Called to,Read Back By and Verified With: FAYE S BY INGRAM A 03/20/14 11AM Performed at [facility]    Report Status 03/22/2014 FINAL   Blood gas, arterial ([facility] & [facility] ONLY)     Status: Abnormal   Collection Time: 03/19/14  3:23 PM  Result Value Ref Range   FIO2  0.21 %   pH, Arterial 7.484 (H) 7.350 - 7.450   pCO2 arterial 40.8 35.0 - 45.0 mmHg   pO2, Arterial 78.7 (L) 80.0 - 100.0 mmHg   Bicarbonate 29.4 (H) 20.0 - 24.0 mEq/L   TCO2 27.1 0 - 100 mmol/L   Acid-Base Excess 6.5 (H) 0.0 - 2.0 mmol/L   O2 Saturation 94.0 %   Patient temperature 103.5    Collection site LEFT RADIAL    Drawn by [id number]    Sample type ARTERIAL DRAW   Type and screen     Status: None   Collection Time: 03/19/14  3:23 PM  Result Value Ref Range   ABO/RH(D) A POS    Antibody Screen NEG    Sample Expiration 03/22/2014    Unit Number [id number]    Blood Component Type RED CELLS,LR    Unit division 00    Status of Unit ISSUED,FINAL    Transfusion Status OK TO TRANSFUSE    Crossmatch Result Compatible   I-stat  troponin, ED (not at [facility])     Status: Abnormal   Collection Time: 03/19/14  3:38 PM  Result Value Ref Range   Troponin i, poc 0.16 (HH) 0.00 - 0.08 ng/mL   Comment NOTIFIED PHYSICIAN    Comment 3            Comment: Due to the release kinetics of cTnI, a negative result within the first hours of the onset of symptoms does not rule out myocardial infarction with certainty. If myocardial infarction is still suspected, repeat the test at appropriate intervals.   I-Stat CG4 Lactic Acid, ED     Status: Abnormal   Collection Time: 03/19/14  3:42 PM  Result Value Ref Range   Lactic Acid, Venous 2.52 (H) 0.5 - 2.2 mmol/L  I-Stat CG4 Lactic Acid, ED     Status: None   Collection Time: 03/19/14  5:19 PM  Result Value Ref Range   Lactic Acid, Venous 1.85 0.5 - 2.2 mmol/L  CBC     Status: Abnormal   Collection Time: 03/19/14  7:44 PM  Result Value Ref Range   WBC 4.3 4.0 - 10.5 K/uL   RBC 2.57 (L) 3.87 - 5.11 MIL/uL   Hemoglobin 7.1 (L) 12.0 - 15.0 g/dL   HCT 22.0 (L) 36.0 - 46.0 %   MCV 85.6 78.0 - 100.0 fL   MCH 27.6 26.0 - 34.0 pg   MCHC 32.3 30.0 - 36.0 g/dL   RDW 16.8 (H) 11.5 - 15.5 %   Platelets 52 (L) 150 - 400 K/uL    Comment: SPECIMEN CHECKED FOR CLOTS REPEATED TO VERIFY DELTA CHECK NOTED PLATELET COUNT CONFIRMED BY SMEAR   Creatinine, serum     Status: Abnormal   Collection Time: 03/19/14  7:44 PM  Result Value Ref Range   Creatinine, Ser 0.32 (L) 0.50 - 1.10 mg/dL   GFR calc non Af Amer >90 >90 mL/min   GFR calc Af Amer >90 >90 mL/min    Comment: (NOTE) The eGFR has been calculated using the CKD EPI equation. This calculation has not been validated in all clinical situations. eGFR's persistently <90 mL/min signify possible Chronic Kidney Disease.   Hemoglobin A1c     Status: Abnormal   Collection Time: 03/19/14  7:44 PM  Result Value Ref Range   Hgb A1c MFr Bld 7.0 (H) <5.7 %    Comment: (NOTE)  According to the ADA Clinical Practice Recommendations for 2011, when HbA1c is used as a screening test:  >=6.5%   Diagnostic of Diabetes Mellitus           (if abnormal result is confirmed) 5.7-6.4%   Increased risk of developing Diabetes Mellitus References:Diagnosis and Classification of Diabetes Mellitus,Diabetes DGLO,7564,33(IRJJO 1):S62-S69 and Standards of Medical Care in         Diabetes - 2011,Diabetes ACZY,6063,01 (Suppl 1):S11-S61.    Mean Plasma Glucose 154 (H) <117 mg/dL    Comment: Performed at [facility]  Troponin I (q 6hr x 3)     Status: Abnormal   Collection Time: 03/19/14  7:44 PM  Result Value Ref Range   Troponin I 0.82 (HH) <0.30 ng/mL    Comment:        Due to the release kinetics of cTnI, a negative result within the first hours of the onset of symptoms does not rule out myocardial infarction with certainty. If myocardial infarction is still suspected, repeat the test at appropriate intervals. CRITICAL RESULT CALLED TO, READ BACK BY AND VERIFIED WITHAdalberto Ill RN 2120 03/19/14 A NAVARRO   Comprehensive metabolic panel     Status: Abnormal   Collection Time: 03/20/14  1:08 AM  Result Value Ref Range   Sodium 129 (L) 137 - 147 mEq/L    Comment: DELTA CHECK NOTED REPEATED TO VERIFY    Potassium 3.2 (L) 3.7 - 5.3 mEq/L   Chloride 98 96 - 112 mEq/L    Comment: DELTA CHECK NOTED REPEATED TO VERIFY    CO2 22 19 - 32 mEq/L   Glucose, Bld 150 (H) 70 - 99 mg/dL   BUN 17 6 - 23 mg/dL   Creatinine, Ser 0.29 (L) 0.50 - 1.10 mg/dL   Calcium 6.5 (L) 8.4 - 10.5 mg/dL   Total Protein 4.9 (L) 6.0 - 8.3 g/dL   Albumin 0.9 (L) 3.5 - 5.2 g/dL   AST 60 (H) 0 - 37 U/L   ALT 11 0 - 35 U/L   Alkaline Phosphatase 157 (H) 39 - 117 U/L   Total Bilirubin 1.0 0.3 - 1.2 mg/dL   GFR calc non Af Amer >90 >90 mL/min   GFR calc Af Amer >90 >90 mL/min    Comment: (NOTE) The eGFR has been calculated using the CKD EPI equation. This calculation has not been  validated in all clinical situations. eGFR's persistently <90 mL/min signify possible Chronic Kidney Disease.    Anion gap 9 5 - 15  CBC     Status: Abnormal   Collection Time: 03/20/14  1:08 AM  Result Value Ref Range   WBC 3.3 (L) 4.0 - 10.5 K/uL   RBC 2.55 (L) 3.87 - 5.11 MIL/uL   Hemoglobin 7.0 (L) 12.0 - 15.0 g/dL   HCT 21.9 (L) 36.0 - 46.0 %   MCV 85.9 78.0 - 100.0 fL   MCH 27.5 26.0 - 34.0 pg   MCHC 32.0 30.0 - 36.0 g/dL   RDW 16.8 (H) 11.5 - 15.5 %   Platelets 48 (L) 150 - 400 K/uL    Comment: CONSISTENT WITH PREVIOUS RESULT  Troponin I (q 6hr x 3)     Status: Abnormal   Collection Time: 03/20/14  1:08 AM  Result Value Ref Range   Troponin I 7.66 (HH) <0.30 ng/mL    Comment:        Due to the release kinetics of cTnI, a negative result within the first hours of the  onset of symptoms does not rule out myocardial infarction with certainty. If myocardial infarction is still suspected, repeat the test at appropriate intervals. CRITICAL RESULT CALLED TO, READ BACK BY AND VERIFIED WITH: [facility] RN AT 0156 12.04.15 BY TIBBITTS,K   Prepare RBC     Status: None   Collection Time: 03/20/14  3:00 AM  Result Value Ref Range   Order Confirmation ORDER PROCESSED BY BLOOD BANK   Glucose, capillary     Status: Abnormal   Collection Time: 03/20/14  8:09 AM  Result Value Ref Range   Glucose-Capillary 102 (H) 70 - 99 mg/dL  Troponin I (q 6hr x 3)     Status: Abnormal   Collection Time: 03/20/14  8:20 AM  Result Value Ref Range   Troponin I >20.00 (HH) <0.30 ng/mL    Comment:        Due to the release kinetics of cTnI, a negative result within the first hours of the onset of symptoms does not rule out myocardial infarction with certainty. If myocardial infarction is still suspected, repeat the test at appropriate intervals. CRITICAL RESULT CALLED TO, READ BACK BY AND VERIFIED WITH: SCHAFFER,S. RN AT 8250 03/20/14 MULLINS,T   CBC     Status: Abnormal   Collection Time:  03/20/14  8:20 AM  Result Value Ref Range   WBC 4.0 4.0 - 10.5 K/uL   RBC 3.23 (L) 3.87 - 5.11 MIL/uL   Hemoglobin 8.8 (L) 12.0 - 15.0 g/dL    Comment: REPEATED TO VERIFY DELTA CHECK NOTED POST TRANSFUSION SPECIMEN    HCT 27.1 (L) 36.0 - 46.0 %   MCV 83.9 78.0 - 100.0 fL   MCH 27.2 26.0 - 34.0 pg   MCHC 32.5 30.0 - 36.0 g/dL   RDW 16.4 (H) 11.5 - 15.5 %   Platelets 63 (L) 150 - 400 K/uL    Comment: REPEATED TO VERIFY DELTA CHECK NOTED RESULT CHECKED   Cortisol     Status: None   Collection Time: 03/20/14  8:20 AM  Result Value Ref Range   Cortisol, Plasma 31.5 ug/dL    Comment: (NOTE) AM:  4.3 - 22.4 ug/dL PM:  3.1 - 16.7 ug/dL Performed at [facility]   Lactic acid, plasma     Status: None   Collection Time: 03/20/14  8:20 AM  Result Value Ref Range   Lactic Acid, Venous 1.6 0.5 - 2.2 mmol/L  Magnesium     Status: None   Collection Time: 03/20/14  8:20 AM  Result Value Ref Range   Magnesium 1.6 1.5 - 2.5 mg/dL  Glucose, capillary     Status: Abnormal   Collection Time: 03/20/14 11:58 AM  Result Value Ref Range   Glucose-Capillary 154 (H) 70 - 99 mg/dL   Comment 1 Documented in Chart    Comment 2 Notify RN   Glucose, capillary     Status: Abnormal   Collection Time: 03/20/14  3:41 PM  Result Value Ref Range   Glucose-Capillary 148 (H) 70 - 99 mg/dL   Comment 1 Documented in Chart    Comment 2 Notify RN   Glucose, capillary     Status: Abnormal   Collection Time: 03/20/14  7:14 PM  Result Value Ref Range   Glucose-Capillary 170 (H) 70 - 99 mg/dL   Comment 1 Documented in Chart    Comment 2 Notify RN   Glucose, capillary     Status: Abnormal   Collection Time: 03/20/14 11:03 PM  Result Value Ref  Range   Glucose-Capillary 180 (H) 70 - 99 mg/dL   Comment 1 Notify RN    Comment 2 Documented in Chart   Glucose, capillary     Status: Abnormal   Collection Time: 03/21/14  3:49 AM  Result Value Ref Range   Glucose-Capillary 201 (H) 70 - 99 mg/dL    Comment 1 Documented in Chart    Comment 2 Notify RN   Comprehensive metabolic panel     Status: Abnormal   Collection Time: 03/21/14  3:57 AM  Result Value Ref Range   Sodium 135 (L) 137 - 147 mEq/L   Potassium 3.7 3.7 - 5.3 mEq/L   Chloride 105 96 - 112 mEq/L   CO2 21 19 - 32 mEq/L   Glucose, Bld 218 (H) 70 - 99 mg/dL   BUN 15 6 - 23 mg/dL   Creatinine, Ser 0.25 (L) 0.50 - 1.10 mg/dL   Calcium 7.4 (L) 8.4 - 10.5 mg/dL   Total Protein 5.4 (L) 6.0 - 8.3 g/dL   Albumin 1.0 (L) 3.5 - 5.2 g/dL   AST 72 (H) 0 - 37 U/L   ALT 15 0 - 35 U/L   Alkaline Phosphatase 203 (H) 39 - 117 U/L   Total Bilirubin 1.0 0.3 - 1.2 mg/dL   GFR calc non Af Amer >90 >90 mL/min   GFR calc Af Amer >90 >90 mL/min    Comment: (NOTE) The eGFR has been calculated using the CKD EPI equation. This calculation has not been validated in all clinical situations. eGFR's persistently <90 mL/min signify possible Chronic Kidney Disease.    Anion gap 9 5 - 15  HIV antibody     Status: None   Collection Time: 03/21/14  3:57 AM  Result Value Ref Range   HIV 1&2 Ab, 4th Generation NONREACTIVE NONREACTIVE    Comment: (NOTE) A NONREACTIVE HIV Ag/Ab result does not exclude HIV infection since the time frame for seroconversion is variable. If acute HIV infection is suspected, a HIV-1 RNA Qualitative TMA test is recommended. HIV-1/2 Antibody Diff         Not indicated. HIV-1 RNA, Qual TMA           Not indicated. PLEASE NOTE: This information has been disclosed to you from records whose confidentiality may be protected by state law. If your state requires such protection, then the state law prohibits you from making any further disclosure of the information without the specific written consent of the person to whom it pertains, or as otherwise permitted by law. A general authorization for the release of medical or other information is NOT sufficient for this purpose. The performance of this assay has not been clinically  validated in patients less than 41 years old. Performed at [facility]   C-reactive protein     Status: Abnormal   Collection Time: 03/21/14  3:57 AM  Result Value Ref Range   CRP 16.1 (H) <0.60 mg/dL    Comment: Performed at [facility]  Hemoglobin A1c     Status: Abnormal   Collection Time: 03/21/14  3:57 AM  Result Value Ref Range   Hgb A1c MFr Bld 6.8 (H) <5.7 %    Comment: (NOTE)  According to the ADA Clinical Practice Recommendations for 2011, when HbA1c is used as a screening test:  >=6.5%   Diagnostic of Diabetes Mellitus           (if abnormal result is confirmed) 5.7-6.4%   Increased risk of developing Diabetes Mellitus References:Diagnosis and Classification of Diabetes Mellitus,Diabetes AVWP,7948,01(KPVVZ 1):S62-S69 and Standards of Medical Care in         Diabetes - 2011,Diabetes SMOL,0786,75 (Suppl 1):S11-S61.    Mean Plasma Glucose 148 (H) <117 mg/dL    Comment: Performed at [facility]  Sedimentation rate     Status: Abnormal   Collection Time: 03/21/14  3:57 AM  Result Value Ref Range   Sed Rate 90 (H) 0 - 22 mm/hr  Prealbumin     Status: Abnormal   Collection Time: 03/21/14  3:57 AM  Result Value Ref Range   Prealbumin <3.0 (L) 17.0 - 34.0 mg/dL    Comment: Result repeated and verified. Performed at [facility]   Hepatitis panel, acute     Status: None   Collection Time: 03/21/14  3:57 AM  Result Value Ref Range   Hepatitis B Surface Ag NEGATIVE NEGATIVE   HCV Ab NEGATIVE NEGATIVE   Hep A IgM NON REACTIVE NON REACTIVE    Comment: (NOTE) Effective March 02, 2014, Hepatitis Acute Panel (test code [phone]) will be revised to automatically reflex to the Hepatitis C Viral RNA, Quantitative, Real-Time PCR assay if the Hepatitis C antibody screening result is Reactive. This action is being taken to ensure that the CDC/USPSTF recommended HCV diagnostic  algorithm with the appropriate test reflex needed for accurate interpretation is followed.    Hep B C IgM NON REACTIVE NON REACTIVE    Comment: (NOTE) High levels of Hepatitis B Core IgM antibody are detectable during the acute stage of Hepatitis B. This antibody is used to differentiate current from past HBV infection. Performed at [facility]   CBC with Differential     Status: Abnormal   Collection Time: 03/21/14  3:57 AM  Result Value Ref Range   WBC 5.4 4.0 - 10.5 K/uL   RBC 3.19 (L) 3.87 - 5.11 MIL/uL   Hemoglobin 8.7 (L) 12.0 - 15.0 g/dL   HCT 26.9 (L) 36.0 - 46.0 %   MCV 84.3 78.0 - 100.0 fL   MCH 27.3 26.0 - 34.0 pg   MCHC 32.3 30.0 - 36.0 g/dL   RDW 16.9 (H) 11.5 - 15.5 %   Platelets 81 (L) 150 - 400 K/uL    Comment: REPEATED TO VERIFY SPECIMEN CHECKED FOR CLOTS CONSISTENT WITH PREVIOUS RESULT    Neutrophils Relative % 82 (H) 43 - 77 %   Neutro Abs 4.4 1.7 - 7.7 K/uL   Lymphocytes Relative 13 12 - 46 %   Lymphs Abs 0.7 0.7 - 4.0 K/uL   Monocytes Relative 5 3 - 12 %   Monocytes Absolute 0.3 0.1 - 1.0 K/uL   Eosinophils Relative 0 0 - 5 %   Eosinophils Absolute 0.0 0.0 - 0.7 K/uL   Basophils Relative 0 0 - 1 %   Basophils Absolute 0.0 0.0 - 0.1 K/uL  Glucose, capillary     Status: Abnormal   Collection Time: 03/21/14  8:08 AM  Result Value Ref Range   Glucose-Capillary 181 (H) 70 - 99 mg/dL   Comment 1 Documented in Chart    Comment 2 Notify RN   Protime-INR     Status: Abnormal   Collection Time:  03/21/14  9:54 AM  Result Value Ref Range   Prothrombin Time 15.3 (H) 11.6 - 15.2 seconds   INR 1.20 0.00 - 1.49  Glucose, capillary     Status: Abnormal   Collection Time: 03/21/14 12:20 PM  Result Value Ref Range   Glucose-Capillary 210 (H) 70 - 99 mg/dL   Comment 1 Documented in Chart    Comment 2 Notify RN   Heparin level (unfractionated)     Status: Abnormal   Collection Time: 03/21/14  2:06 PM  Result Value Ref Range   Heparin Unfractionated  <0.10 (L) 0.30 - 0.70 IU/mL    Comment:        IF HEPARIN RESULTS ARE BELOW EXPECTED VALUES, AND PATIENT DOSAGE HAS BEEN CONFIRMED, SUGGEST FOLLOW UP TESTING OF ANTITHROMBIN III LEVELS. Performed at [facility]   Glucose, capillary     Status: Abnormal   Collection Time: 03/21/14  4:27 PM  Result Value Ref Range   Glucose-Capillary 235 (H) 70 - 99 mg/dL  Glucose, capillary     Status: Abnormal   Collection Time: 03/21/14 10:23 PM  Result Value Ref Range   Glucose-Capillary 258 (H) 70 - 99 mg/dL   Comment 1 Documented in Chart    Comment 2 Notify RN   BMET in AM     Status: Abnormal   Collection Time: 03/22/14  3:14 AM  Result Value Ref Range   Sodium 133 (L) 137 - 147 mEq/L   Potassium 4.0 3.7 - 5.3 mEq/L   Chloride 103 96 - 112 mEq/L   CO2 18 (L) 19 - 32 mEq/L   Glucose, Bld 276 (H) 70 - 99 mg/dL   BUN 24 (H) 6 - 23 mg/dL   Creatinine, Ser 0.48 (L) 0.50 - 1.10 mg/dL    Comment: DELTA CHECK NOTED REPEATED TO VERIFY    Calcium 7.4 (L) 8.4 - 10.5 mg/dL   GFR calc non Af Amer >90 >90 mL/min   GFR calc Af Amer >90 >90 mL/min    Comment: (NOTE) The eGFR has been calculated using the CKD EPI equation. This calculation has not been validated in all clinical situations. eGFR's persistently <90 mL/min signify possible Chronic Kidney Disease.    Anion gap 12 5 - 15  CBC     Status: Abnormal   Collection Time: 03/22/14  3:14 AM  Result Value Ref Range   WBC 6.7 4.0 - 10.5 K/uL   RBC 3.58 (L) 3.87 - 5.11 MIL/uL   Hemoglobin 9.7 (L) 12.0 - 15.0 g/dL   HCT 30.2 (L) 36.0 - 46.0 %   MCV 84.4 78.0 - 100.0 fL   MCH 27.1 26.0 - 34.0 pg   MCHC 32.1 30.0 - 36.0 g/dL   RDW 16.7 (H) 11.5 - 15.5 %   Platelets 113 (L) 150 - 400 K/uL    Comment: DELTA CHECK NOTED REPEATED TO VERIFY   Magnesium     Status: None   Collection Time: 03/22/14  3:14 AM  Result Value Ref Range   Magnesium 1.8 1.5 - 2.5 mg/dL  Phosphorus     Status: Abnormal   Collection Time: 03/22/14  3:14 AM   Result Value Ref Range   Phosphorus 1.9 (L) 2.3 - 4.6 mg/dL  Heparin level (unfractionated)     Status: Abnormal   Collection Time: 03/22/14  3:14 AM  Result Value Ref Range   Heparin Unfractionated <0.10 (L) 0.30 - 0.70 IU/mL    Comment:  IF HEPARIN RESULTS ARE BELOW EXPECTED VALUES, AND PATIENT DOSAGE HAS BEEN CONFIRMED, SUGGEST FOLLOW UP TESTING OF ANTITHROMBIN III LEVELS. Performed at [facility]   Glucose, capillary     Status: Abnormal   Collection Time: 03/22/14  7:43 AM  Result Value Ref Range   Glucose-Capillary 250 (H) 70 - 99 mg/dL   Comment 1 Documented in Chart    Comment 2 Notify RN    Labs are reviewed and are pertinent for staph infection  Current Facility-Administered Medications  Medication Dose Route Frequency Provider Last Rate Last Dose  . 0.9 %  sodium chloride infusion  250 mL Intravenous PRN Oswald Hillock, MD 10 mL/hr at 03/21/14 1900 250 mL at 03/21/14 1900  . acetaminophen (TYLENOL) tablet 650 mg  650 mg Oral Q6H PRN Oswald Hillock, MD       Or  . acetaminophen (TYLENOL) suppository 650 mg  650 mg Rectal Q6H PRN Oswald Hillock, MD      . antiseptic oral rinse (CPC / CETYLPYRIDINIUM CHLORIDE 0.05%) solution 7 mL  7 mL Mouth Rinse q12n4p Modena Jansky, MD   7 mL at 03/21/14 1548  . aspirin chewable tablet 81 mg  81 mg Oral Daily Pixie Casino, MD   81 mg at 03/22/14 0943  . atorvastatin (LIPITOR) tablet 20 mg  20 mg Oral q1800 Dorothy Spark, MD   20 mg at 03/21/14 1737  . ceFAZolin (ANCEF) IVPB 2 g/50 mL premix  2 g Intravenous Q8H Mosetta Pigeon, RPH   2 g at 03/22/14 1238  . chlorhexidine (PERIDEX) 0.12 % solution 15 mL  15 mL Mouth Rinse BID Modena Jansky, MD   15 mL at 03/22/14 0800  . chlorpheniramine-HYDROcodone (TUSSIONEX) 10-8 MG/5ML suspension 5 mL  5 mL Oral Q12H PRN Shanda Howells, MD   5 mL at 03/22/14 0133  . collagenase (SANTYL) ointment   Topical Daily Modena Jansky, MD      . feeding supplement (GLUCERNA SHAKE)  (GLUCERNA SHAKE) liquid 237 mL  237 mL Oral TID BM Hazle Coca, RD   237 mL at 03/22/14 0942  . fludrocortisone (FLORINEF) tablet 0.1 mg  0.1 mg Oral Daily Raylene Miyamoto, MD   0.1 mg at 03/22/14 0942  . heparin ADULT infusion 100 units/mL (25000 units/250 mL)  1,450 Units/hr Intravenous Continuous Leann Trefz Poindexter, RPH 14.5 mL/hr at 03/22/14 0800 1,450 Units/hr at 03/22/14 0800  . hydrocortisone sodium succinate (SOLU-CORTEF) 100 MG injection 50 mg  50 mg Intravenous Q6H Raylene Miyamoto, MD   50 mg at 03/22/14 [phone]  . insulin aspart (novoLOG) injection 0-5 Units  0-5 Units Subcutaneous QHS Modena Jansky, MD      . insulin aspart (novoLOG) injection 0-9 Units  0-9 Units Subcutaneous TID WC Modena Jansky, MD   5 Units at 03/22/14 1200  . insulin glargine (LANTUS) injection 10 Units  10 Units Subcutaneous Daily Modena Jansky, MD   10 Units at 03/22/14 1237  . mirtazapine (REMERON SOL-TAB) disintegrating tablet 30 mg  30 mg Oral QHS Levonne Spiller, MD   30 mg at 03/21/14 2139  . ondansetron (ZOFRAN) tablet 4 mg  4 mg Oral Q6H PRN Oswald Hillock, MD   4 mg at 03/21/14 1352   Or  . ondansetron (ZOFRAN) injection 4 mg  4 mg Intravenous Q6H PRN Oswald Hillock, MD      . phenylephrine (NEO-SYNEPHRINE) 10 mg in dextrose 5 % 250 mL (  0.04 mg/mL) infusion  30-200 mcg/min Intravenous Titrated Raylene Miyamoto, MD 75 mL/hr at 03/22/14 1042 50 mcg/min at 03/22/14 1042  . sodium chloride 0.9 % injection 3 mL  3 mL Intravenous Q12H Oswald Hillock, MD   3 mL at 03/22/14 0940  . sodium chloride 0.9 % injection 3 mL  3 mL Intravenous PRN Oswald Hillock, MD        Psychiatric Specialty Exam:     Blood pressure 135/68, pulse 65, temperature 97.7 F (36.5 C), temperature source Oral, resp. rate 24, height 5' 4"  (1.626 m), weight 194 lb 3.6 oz (88.1 kg), SpO2 99 %.Body mass index is 33.32 kg/(m^2).  General Appearance: Casual  Eye Contact::  Fair  Speech:  Clear and Coherent  Volume: decreased   Mood:depressed but improved  Affect:  Constricted and Depressed  Thought Process:  More organized  Orientation:  Full (Time, Place, and Person)  Thought Content:  Rumination  Suicidal Thoughts:  No  Homicidal Thoughts:  No  Memory:  Immediate;   Poor Recent;   Poor Remote;   Poor  Judgement:  Impaired  Insight:  Fair  Psychomotor Activity:  Decreased  Concentration:  Poor  Recall:  Poor  Fund of Knowledge:Fair  Language: Fair  Akathisia:  No  Handed:  Right  AIMS (if indicated):     Assets:  Social Support  Sleep:      Musculoskeletal: Strength & Muscle Tone: decreased Gait & Station: unable to stand Patient leans: N/A  Treatment Plan Summary: Daily contact with patient to assess and evaluate symptoms and progress in treatment Medication management will continue Remeron  ROSS, [facility] 03/22/2014 1:25 PM

## 2014-03-22 NOTE — Progress Notes (Signed)
UR Completed.  [phone]  

## 2014-03-22 NOTE — Progress Notes (Signed)
Patient Name: Robin Schroeder Date of Encounter: 03/22/2014  Active Problems:   Sepsis   DM2 (diabetes mellitus, type 2)   Physical deconditioning   Hypoalbuminemia   Hyponatremia   Elevated troponin   Severe sepsis   Acute UTI   Dehydration with hyponatremia   Protein-calorie malnutrition, severe   Diabetic foot ulcer   Sacral decubitus ulcer   Acute pulmonary edema   Effusion into joint   ST elevation myocardial infarction (STEMI) of inferolateral wall, initial episode of care   Severe sepsis with septic shock   Diabetic ulcer of left foot associated with diabetes mellitus due to underlying condition   Osteomyelitis   Ulcer of heel   Acute osteomyelitis of femur   Septic arthritis of knee, left   Length of Stay: 3  SUBJECTIVE  The patient denies any chest pain or SOB> She is laying flat in bed. Complains of pain in her legs. Troponin peaked at >20. Repeat echo from 12/4 still pending. H/H has improved overnight. No overt bleeding on heparin. Platelet count is rebounding, now up to 113K.  CURRENT MEDS . antiseptic oral rinse  7 mL Mouth Rinse q12n4p  . aspirin  81 mg Oral Daily  . atorvastatin  20 mg Oral q1800  .  ceFAZolin (ANCEF) IV  2 g Intravenous Q8H  . chlorhexidine  15 mL Mouth Rinse BID  . collagenase   Topical Daily  . feeding supplement (GLUCERNA SHAKE)  237 mL Oral TID BM  . fludrocortisone  0.1 mg Oral Daily  . hydrocortisone sod succinate (SOLU-CORTEF) inj  50 mg Intravenous Q6H  . insulin aspart  0-9 Units Subcutaneous TID WC  . mirtazapine  30 mg Oral QHS  . sodium chloride  3 mL Intravenous Q12H  . vancomycin  1,250 mg Intravenous Q12H   . heparin 1,450 Units/hr (03/22/14 4034)  . phenylephrine (NEO-SYNEPHRINE) Adult infusion 50 mcg/min (03/22/14 0655)     OBJECTIVE  Filed Vitals:   03/22/14 0500 03/22/14 0530 03/22/14 0600 03/22/14 0630  BP: 111/58 80/40 87/40  89/43  Pulse: 64 70 63 57  Temp:      TempSrc:      Resp: 26 23 21 19     Height:      Weight:      SpO2: 99% 97% 98% 99%    Intake/Output Summary (Last 24 hours) at 03/22/14 0736 Last data filed at 03/22/14 14/06/15  Gross per 24 hour  Intake 3023.67 ml  Output    247 ml  Net 2776.67 ml   Filed Weights   03/19/14 1550 03/22/14 0437  Weight: 215 lb (97.523 kg) 194 lb 3.6 oz (88.1 kg)    PHYSICAL EXAM  General: Pleasant, NAD. Neuro: Alert and oriented X 3. Moves all extremities spontaneously. Psych: Normal affect. HEENT:  Normal  Neck: Supple without bruits or JVD. Lungs:  Resp regular and unlabored, CTA. Heart: RRR no s3, s4, or murmurs. Abdomen: Soft, non-tender, non-distended, BS + x 4.  Extremities: No clubbing, cyanosis or edema. DP/PT/Radials 2+ and equal bilaterally.  Accessory Clinical Findings  CBC  Recent Labs  03/19/14 1508  03/21/14 0357 03/22/14 0314  WBC 4.2  < > 5.4 6.7  NEUTROABS 3.8  --  4.4  --   HGB 7.9*  < > 8.7* 9.7*  HCT 24.7*  < > 26.9* 30.2*  MCV 84.3  < > 84.3 84.4  PLT 77*  < > 81* 113*  < > = values in this interval not displayed.  Basic Metabolic Panel  Recent Labs  03/20/14 0820 03/21/14 0357 03/22/14 0314  NA  --  135* 133*  K  --  3.7 4.0  CL  --  105 103  CO2  --  21 18*  GLUCOSE  --  218* 276*  BUN  --  15 24*  CREATININE  --  0.25* 0.48*  CALCIUM  --  7.4* 7.4*  MG 1.6  --  1.8  PHOS  --   --  1.9*   Liver Function Tests  Recent Labs  03/20/14 0108 03/21/14 0357  AST 60* 72*  ALT 11 15  ALKPHOS 157* 203*  BILITOT 1.0 1.0  PROT 4.9* 5.4*  ALBUMIN 0.9* 1.0*   No results for input(s): LIPASE, AMYLASE in the last 72 hours. Cardiac Enzymes  Recent Labs  03/19/14 1944 03/20/14 0108 03/20/14 0820  TROPONINI 0.82* 7.66* >20.00*    Recent Labs  03/21/14 0357  HGBA1C 6.8*   Radiology/Studies  Dg Chest Port 1 View  03/21/2014   CLINICAL DATA:  Acute pulmonary edema. History of hypertension, diabetes and coronary artery disease. Initial encounter.  EXAM: PORTABLE CHEST - 1 VIEW   COMPARISON:  Radiographs 01/27/2014 and 03/20/2014.  FINDINGS: 1929 hr. The heart size and mediastinal contours are stable. There is persistent elevation of the left hemidiaphragm with increased left basilar airspace disease. The right lung is clear. There is no pneumothorax or significant pleural effusion. No acute osseous findings are demonstrated. Degenerative changes are present throughout the thoracic spine and at both shoulders. Telemetry leads overlie the chest.  IMPRESSION: Progressive left lower lobe airspace disease. Although in part chronic, this could reflect superimposed aspiration. No overt pulmonary edema.   Electronically Signed   By: Roxy Horseman M.D.   On: 03/21/2014 08:10   Dg Chest Port 1 View  03/20/2014   CLINICAL DATA:  Fever and sepsis  EXAM: PORTABLE CHEST - 1 VIEW  COMPARISON:  March 19, 2014 study obtained earlier in the day ; January 27, 2014  FINDINGS: There is mild cardiomegaly with mild generalized interstitial edema. There is atelectatic change in the left base with small left effusion. Pulmonary vascularity is within normal limits. Bones are osteoporotic.  IMPRESSION: Cardiomegaly with interstitial edema. These findings are felt to represent a degree of congestive heart failure. Atelectasis left base. Small left effusion.   Electronically Signed   By: Bretta Bang M.D.   On: 03/20/2014 09:34   Dg Chest Port 1 View  (if Code Sepsis Called)  03/19/2014   CLINICAL DATA:  Failure to thrive  EXAM: PORTABLE CHEST - 1 VIEW  COMPARISON:  01/27/2014  FINDINGS: Patient is rotated considerably to the left and the study was not repeated. Study is significantly limited. Lungs are grossly clear.  IMPRESSION: Recommend repeat study. The patient is rotated considerably. Lungs are grossly clear.   Electronically Signed   By: Marlan Palau M.D.   On: 03/19/2014 14:51   Dg Knee Left Port  03/20/2014   CLINICAL DATA:  Bilateral foot can knee pain, initial evaluation, multiple open  wounds due to bacterial infection, bilateral knee effusions  EXAM: PORTABLE LEFT KNEE - 1-2 VIEW  COMPARISON:  None.  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 The medial cortex of the medial tibial plateau demonstrates loss of definition. This is concerning for the possibility of osteomyelitis. The cortex along the medial margin of the distal medial femoral condyles is also indistinct. There is medial soft tissue swelling at  the joint line.  IMPRESSION: The findings are concerning for cellulitis with underlying osteomyelitis medially.   Electronically Signed   By: Esperanza Heir M.D.   On: 03/20/2014 17:51   Dg Knee Right Port  03/20/2014   CLINICAL DATA:  Bilateral knee pain  EXAM: PORTABLE RIGHT KNEE - 1-2 VIEW  COMPARISON:  None.  FINDINGS: Severe tricompartmental degenerative change is noted. Small suprapatellar effusion. No fracture or dislocation. No radiopaque foreign body.  IMPRESSION: Severe tricompartmental degenerative change.   Electronically Signed   By: Christiana Pellant M.D.   On: 03/20/2014 17:51   Dg Foot Complete Left  03/20/2014   CLINICAL DATA:  Left heel wound  EXAM: LEFT FOOT - COMPLETE 3+ VIEW  COMPARISON:  None.  FINDINGS: There is extensive artifact from material overlying the foot which significantly decreases sensitivity and specificity for detection of osseous or soft tissue abnormalities. No grossly displaced fracture is identified. Bones are subjectively osteopenic. Dorsal soft tissue swelling is evident with plantar calcaneal spurring and vascular calcifications noted.  IMPRESSION: Suboptimal visualization due to support apparatus, but with nonspecific dorsal soft tissue swelling identified.   Electronically Signed   By: Christiana Pellant M.D.   On: 03/20/2014 17:07   Dg Foot Complete Right  03/20/2014   CLINICAL DATA:  Bilateral foot pain, open skin wound  EXAM: RIGHT FOOT COMPLETE - 3+ VIEW  COMPARISON:  None.  FINDINGS: Bones are subjectively  osteopenic. Positioning is suboptimal due to patient immobility and portable technique. Degenerative changes are noted at the midfoot. No fracture or dislocation allowing for positioning. Plantar calcaneal spurring noted. There is soft tissue swelling involving the dorsum of the foot and adjacent to the lateral malleolus. No radiopaque foreign body.  IMPRESSION: Subjective osteopenia without acute osseous abnormality.  Dorsal soft tissue swelling and swelling over the lateral malleolus. Correlate for cellulitis or skin abnormalities in this area.   Electronically Signed   By: Christiana Pellant M.D.   On: 03/20/2014 17:51    ECG (12/6): Evolving inferolateral STEMI - Q waves are now present inferiorly  ASSESSMENT AND PLAN  Active Problems:  Sepsis  58 year old female with multiple medical problems including HTN, obesity, DM and chronic diastolic CHF who was discharged from the hospital in October after she was treated for MSSA bacteremia with sepsis who was forced to come to the hospital today by her husband for evaluation of infected decubitus ulcers. Cardiology was consulted for a mildly elevated POC troponin and some STE on her ECGs.   Inferolateral STEMI-troponin >20 yesterday morning, minimal STE was present, but it is now more apparent and 1 mm in V2-v6 and II, III, AVF. This is more consistent with acute MI, although the patient is asymptomatic and not having chest pain at this time - this may be due to diabetic neuropathy. The EKG has clearly changed compared to the 12/3 EKG, however, we are now more than 24 hours since occlusion of the infarct artery - therefore, emergent cardiac catheterization would be less beneficial. She continues to be a poor cath candidate considering ongoing sepsis, anemia and thrombocytopenia.  H/H improved overnight, despite the addition of aspirin. Platelet count is also climbing and she is now on aspirin, this likely represents improving sepsis and her coming out of  DIC picture.  Would continue heparin for at least 24 more hours. Check repeat troponin tomorrow to see if it is coming down. Unfortunately, the echo system has been down, so we are still waiting her echo results.  Chrystie Nose, MD, [facility] Attending Cardiologist [facility]  Lavine Hargrove C 03/22/2014

## 2014-03-22 NOTE — Progress Notes (Signed)
ANTICOAGULATION CONSULT NOTE   Pharmacy Consult for heparin Indication: chest pain/ACS  Allergies  Allergen Reactions  . Penicillins Rash    Patient Measurements: Height: 5\' 4"  (162.6 cm) Weight: 194 lb 3.6 oz (88.1 kg) IBW/kg (Calculated) : 54.7 Heparin Dosing Weight: 77kg  Vital Signs: Temp: 97.7 F (36.5 C) (12/06 1205) Temp Source: Oral (12/06 1205) BP: 135/68 mmHg (12/06 1200) Pulse Rate: 65 (12/06 1200)  Labs:  Recent Labs  03/19/14 1508 03/19/14 1944 03/20/14 0108 03/20/14 0820 03/21/14 0357 03/21/14 0954 03/21/14 1406 03/22/14 0314 03/22/14 1217  HGB 7.9* 7.1* 7.0* 8.8* 8.7*  --   --  9.7*  --   HCT 24.7* 22.0* 21.9* 27.1* 26.9*  --   --  30.2*  --   PLT 77* 52* 48* 63* 81*  --   --  113*  --   LABPROT 16.2*  --   --   --   --  15.3*  --   --   --   INR 1.29  --   --   --   --  1.20  --   --   --   HEPARINUNFRC  --   --   --   --   --   --  <0.10* <0.10* 0.47  CREATININE 0.37* 0.32* 0.29*  --  0.25*  --   --  0.48*  --   TROPONINI  --  0.82* 7.66* >20.00*  --   --   --   --   --     Estimated Creatinine Clearance: 80.4 mL/min (by C-G formula based on Cr of 0.48).  Infusions:  . heparin 1,450 Units/hr (03/22/14 0800)  . phenylephrine (NEO-SYNEPHRINE) Adult infusion 50 mcg/min (03/22/14 1042)    Assessment: 58 yoF admitted 12/3 from home refusing care, wanting to hurt herself. Pt with recent admission in October for MSSA bacteremia and sepsis. Pt appeared septic on admission and was started on broad spectrum antibiotics per Pharmacy.   Mildly elevated troponin noted on admission, MD thought secondary to demand ischemia due to infectious process. Pt with critical troponin overnight 12/4, also with anemia, thrombocytopenia. MD notes ECG is unchanged from 10/14, minimal STE were present then. Cardiology recommended starting heparin 12/4, patient is poor cath candidate considering ongoing sepsis, anemia and thrombocytopenia. Heparin not started as patient has  been asymptomatic.   Troponins continue to increase, and Cardiology MD notes more changes on EKG. Pharmacy is now consulted to dose heparin without bolus for 48 hours.   Heparin level now within therapeutic range at 0.47 on 1450 units/hr   No issues with infusion or bleeding per RN  Hgb goal > 8.0 per MD, currently Hgb=9.7  plts low at 113k  Noted patient placed on aspirin 81mg  daily per cardiology as long as plt > 50k  Goal of Therapy:  Heparin level 0.3-0.7 units/ml Monitor platelets by anticoagulation protocol: Yes   Plan:  - continue heparin gtt at 1450 units/hr - heparin level in 6 hours to confirm - daily heparin level and CBC - monitor for bleeding  Thank you for the consult.  14/4, PharmD, BCPS Pager: [phone] Pharmacy: [phone] 03/22/2014 2:31 PM

## 2014-03-22 NOTE — Clinical Social Work Note (Signed)
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 and has not been able to walk well since that fall  Pt stated that she was at a SNF ([facility]) in the past and that she was unwilling to go back to that facility   Pt stated that if her doctors think she needs to build her strength when she is ready to leave the hospital she would be open to other facilities  Pt stated that [facility] can send her information out to all other facilities in [place] but not to [facility]  CSW will wait for PT evaluation to see what pt needs regarding placement  CSW will continue to monitor and assess pt for services  Dede Query, [place] Worker - Weekend Coverage cell #: [phone]

## 2014-03-22 NOTE — Progress Notes (Signed)
CRITICAL VALUE ALERT  Critical value received:  Vancomycin level 32.5  Date of notification:  03/22/2014  Time of notification:  1:55pm  Critical value read back:Yes.    Nurse who received alert:  Esmond Harps  MD notified (1st page):  [place]  Time of first page:  2:07pm  MD notified (2nd page):  Time of second page:  Responding MD:    Time MD responded:

## 2014-03-22 NOTE — Progress Notes (Signed)
NUTRITION FOLLOW-UP  INTERVENTION: -Continue Glucerna Shake po TID, each supplement provides 220 kcal and 10 grams of protein -Encourage PO intake -RD to continue to monitor  NUTRITION DIAGNOSIS: Inadequate oral intake related to decreased appetite/refusal of meals as evidenced by PO intake < 75% > one month, progressing.  Goal: Pt to meet >/= 90% of their estimated nutrition needs, unmet    Monitor:  Total protein/energy intake, labs, weights, supplement acceptance, skin integrity   Admitting Dx: Septic shock secondary to staph bacteremia  ASSESSMENT: 58 year old female with multiple medical problems who was discharged from the hospital in October after she was treated for MSSA bacteremia with sepsis.  As per patient's husband, patient did not want to eat or drink and she is not eating and drinking for past 5 days and so he got concerned that she wants to hurt herself. So he filled out IVC and patient was brought by the EMS.  Pt reports eating better today, NT agreed. Pt is taking small sips and bites of meals, PO improved. Pt states that she likes the Glucerna shakes and was drinking one during visit.  RD encouraged pt to eat as much as tolerated to promote wound healing.   Labs reviewed: Low Na, Creatinine & Phos Elevated BUN Glucose 276  Height: Ht Readings from Last 1 Encounters:  03/19/14 5\' 4"  (1.626 m)    Weight: Wt Readings from Last 1 Encounters:  03/22/14 194 lb 3.6 oz (88.1 kg)   BMI:  Body mass index is 33.32 kg/(m^2).  Estimated Nutritional Needs: Kcal: 1800-2000 Protein: 100-110 gram Fluid: per MD  Skin: stg 2 pressure ulcer on heel and arm, deep tissue injury on heel  Diet Order: Diet regular  EDUCATION NEEDS: -No education needs identified at this time   Intake/Output Summary (Last 24 hours) at 03/22/14 0930 Last data filed at 03/22/14 0800  Gross per 24 hour  Intake 2841.67 ml  Output    282 ml  Net 2559.67 ml    Last BM:  12/5  Labs:   Recent Labs Lab 03/20/14 0108 03/20/14 0820 03/21/14 0357 03/22/14 0314  NA 129*  --  135* 133*  K 3.2*  --  3.7 4.0  CL 98  --  105 103  CO2 22  --  21 18*  BUN 17  --  15 24*  CREATININE 0.29*  --  0.25* 0.48*  CALCIUM 6.5*  --  7.4* 7.4*  MG  --  1.6  --  1.8  PHOS  --   --   --  1.9*  GLUCOSE 150*  --  218* 276*    CBG (last 3)   Recent Labs  03/21/14 1220 03/21/14 1627 03/21/14 2223  GLUCAP 210* 235* 258*    Scheduled Meds: . antiseptic oral rinse  7 mL Mouth Rinse q12n4p  . aspirin  81 mg Oral Daily  . atorvastatin  20 mg Oral q1800  .  ceFAZolin (ANCEF) IV  2 g Intravenous Q8H  . chlorhexidine  15 mL Mouth Rinse BID  . collagenase   Topical Daily  . feeding supplement (GLUCERNA SHAKE)  237 mL Oral TID BM  . fludrocortisone  0.1 mg Oral Daily  . hydrocortisone sod succinate (SOLU-CORTEF) inj  50 mg Intravenous Q6H  . insulin aspart  0-9 Units Subcutaneous TID WC  . mirtazapine  30 mg Oral QHS  . sodium chloride  3 mL Intravenous Q12H  . vancomycin  1,250 mg Intravenous Q12H    Continuous Infusions: .  heparin 1,450 Units/hr (03/22/14 2458)  . phenylephrine (NEO-SYNEPHRINE) Adult infusion 50 mcg/min (03/22/14 0655)    Tilda Franco, MS, RD, LDN Pager: [phone] After Hours Pager: [phone]

## 2014-03-22 NOTE — Progress Notes (Signed)
PROGRESS NOTE    Robin Schroeder PTW:[id number] DOB: Jul 07, 1955 DOA: 03/19/2014 PCP: No PCP Per Patient  HPI/Brief narrative 58 year old female patient with history of hypertension, obesity, DM, chronic diastolic CHF, possible rheumatoid arthritis, recent hospitalization for MSSA bacteremia of unknown source/neg TEE- cleared after Vancomycin (PCN allergy), went to SNF and then discharged from their to home, presented with complaints of not eating or drinking 4 days and husband was concerned that patient was trying to hurt herself and she was IVC and brought to ED where she was assessed as sepsis of unclear source and admitted to stepdown for further management. Patient went into septic shock requiring pressors. She also developed STEMI but poor overall candidate for aggressive intervention i.e. Currently. CCM, ID and cardiology assisting with care.  Assessment/Plan:  1. Septic shock secondary to MSSA bacteremia & ?UTI: Patient had been empirically started on IV vancomycin and aztreonam. Following preliminary cultures, aztreonam discontinued. Ancef added. Source not clear-ruling out skin and soft tissue infection- ? Osteomyelitis of medial left knee on x-ray. Infectious disease follow-up appreciated. Patient was started on Neo-Synephrine for shock on 11/4 and CCM following. Attempts to wean unsuccessful. KVO IV fluids secondary to concern for pulmonary edema and decreased urine output in the context of volume overload. Continue stress dose IV hydrocortisone and Fludrocortisone. As per ID, will eventually need repeat TEE when medically stable.  2. ? Osteomyelitis of medial left knee on x-ray: ID follow-up appreciated. MRI of left knee when stable. 3. Left lower lobe airspace disease: Possibly chronic.? Pneumonia. ID and pulmonary to weigh in the need for broadening antibiotic coverage. In the absence of respiratory symptoms, felt less likely.  4. Dehydration with hyponatremia: Resolved. IV fluid  discontinued due to concern for pulmonary edema/volume overload. 5. Inferiolateral wall STEMI: Patient has not complained of chest pain since admission. Troponins steadily increased to >20 on 12/3. Not candidate for aggressive intervention i.e. cath yesterday or today secondary to unstable status from shock, anemia and thrombocytopenia. As per cardiology follow-up 12/4, definitely has STEMI and have started low-dose aspirin and heparin drip since hemoglobin is stable posttransfusion and platelets have improved compared to admission. Beta blockers discontinued secondary to shock. Will eventually need cardiac catheterization when stable. Follow-up 2-D echo-cardiologists unable to read secondary to systems being down for the last few days. Cardiology input appreciated. Continue statins. Tolerating IV heparin and aspirin-continue heparin for additional 24 hours. 6. IVC/?? Intention to self harm: Patient however repeatedly denies. 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 Psychiatry consultation appreciated. Discussed with Dr. Tenny Craw 12/5. 7. Anasarca: Secondary to ongoing illnesses, malnutrition and hypo-albuminemia. Treat underlying cause and attempt to improve nutritional status. Diuretics when able. 8. Right sacral decubitus: Does not appear grossly infected. Wound care consultation requested. 9. Hypertension: Patient in septic shock. Management as above. 10. Type II DM: Uncontrolled. Likely worsened by steroids. Will add low-dose Lantus to SSI. 11. Possible rheumatoid arthritis: Patient was on prednisone at home. Currently on stress dose IV hydrocortisone.  12. Chronic diastolic CHF: Seems compensated. Chest x-ray without pulmonary edema 12/5. 13. Hypokalemia: Replaced. 14. Anemia: Acute on chronic Unclear etiology. Baseline hemoglobin probably in the 9 g per DL range. Status post 1 unit PRBC 12/4 with appropriate response. Hemoglobin stable  posttransfusion. 15. Thrombocytopenia: Possibly from acute illness/sepsis. Continues to improve. Follow closely while on IV heparin. 16. Multiple pressure wounds: Details as per WOC nurse note on 12/4. Patient has left heel  DTI, right arm full-thickness abrasion and unstageable right buttock wound. 17. Failure to thrive: Multifactorial 18. Severe deconditioning: PT and OT evaluation unable. 19. Severe protein calorie malnutrition: Dietitian consulted. 20. Depressive disorder secondary to general medical condition: Management per psychiatry. Remeron added. 21. Oliguria: Likely secondary to shock. CCM providing bolus of IV fluids. Monitor closely. If that doesn't work may have to try a higher dose of IV Lasix. 22. ? Gram Negative Rod UTI: Continue Ancef pending final cultures.    Code Status: Limited code Family Communication: None at bedside. Disposition Plan: Remains in ICU. Not medically stable for discharge.   Consultants:  Cardiology  ID  Psychiatry  PCCM  Procedures:  Arterial line-discontinued  Foley catheter  Antibiotics:  IV Vanc 12/3>  IV Aztreonam 12/3> 12/4   IV Ancef.  Subjective: Symmetrical bilateral knee pains, states that she has had these for 3-4 weeks. No dyspnea or chest pain. As per nursing, progressively reduced urine output that did not respond to Lasix last night. 65 ML UOP last 12 hours.   Objective: Filed Vitals:   03/22/14 0753 03/22/14 0800 03/22/14 0900 03/22/14 1000  BP:  111/56 106/49 119/64  Pulse:  59 53 67  Temp: 97.8 F (36.6 C)     TempSrc: Oral     Resp:  23 18 24   Height:      Weight:      SpO2:  97% 100% 99%   Intake/Output Summary (Last 24 hours) at 03/22/14 1021 Last data filed at 03/22/14 1000  Gross per 24 hour  Intake 3257.23 ml  Output    282 ml  Net 2975.23 ml   Filed Weights   03/19/14 1550 03/22/14 0437  Weight: 97.523 kg (215 lb) 88.1 kg (194 lb 3.6 oz)     Exam:  General exam: Pleasant middle-aged  female, moderately built and obese lying comfortably supine in bed. Respiratory system: Slightly diminished breath sounds in the bases but otherwise clear to auscultation. No increased work of breathing. Cardiovascular system: S1 & S2 heard, RRR. No JVD, murmurs, gallops, clicks. Anasarca. Telemetry: SB in the 50s-SR. Gastrointestinal system: Abdomen is nondistended, soft and nontender. Normal bowel sounds heard. Patient has patchy ecchymosis over right upper quadrant. Central nervous system: Alert and oriented 3. No focal neurological deficits. Extremities: Symmetric 5 x 5 power. Left knee with mild increased warmth and tender to touch. Skin: Dry appearing right sacral decubitus without signs of overt infection. Patient has skin tear over the right posterior upper arm. Left heel DTI without signs of acute infection. Psychiatry: Pleasant and comfortable today.  Data Reviewed: Basic Metabolic Panel:  Recent Labs Lab 03/19/14 1508 03/19/14 1944 03/20/14 0108 03/20/14 0820 03/21/14 0357 03/22/14 0314  NA 121*  --  129*  --  135* 133*  K 3.8  --  3.2*  --  3.7 4.0  CL 82*  --  98  --  105 103  CO2 28  --  22  --  21 18*  GLUCOSE 221*  --  150*  --  218* 276*  BUN 24*  --  17  --  15 24*  CREATININE 0.37* 0.32* 0.29*  --  0.25* 0.48*  CALCIUM 7.8*  --  6.5*  --  7.4* 7.4*  MG  --   --   --  1.6  --  1.8  PHOS  --   --   --   --   --  1.9*   Liver Function Tests:  Recent Labs  Lab 03/19/14 1508 03/20/14 0108 03/21/14 0357  AST 46* 60* 72*  ALT 12 11 15   ALKPHOS 219* 157* 203*  BILITOT 1.6* 1.0 1.0  PROT 6.6 4.9* 5.4*  ALBUMIN 1.2* 0.9* 1.0*   No results for input(s): LIPASE, AMYLASE in the last 168 hours. No results for input(s): AMMONIA in the last 168 hours. CBC:  Recent Labs Lab 03/19/14 1508 03/19/14 1944 03/20/14 0108 03/20/14 0820 03/21/14 0357 03/22/14 0314  WBC 4.2 4.3 3.3* 4.0 5.4 6.7  NEUTROABS 3.8  --   --   --  4.4  --   HGB 7.9* 7.1* 7.0* 8.8* 8.7*  9.7*  HCT 24.7* 22.0* 21.9* 27.1* 26.9* 30.2*  MCV 84.3 85.6 85.9 83.9 84.3 84.4  PLT 77* 52* 48* 63* 81* 113*   Cardiac Enzymes:  Recent Labs Lab 03/19/14 1944 03/20/14 0108 03/20/14 0820  TROPONINI 0.82* 7.66* >20.00*   BNP (last 3 results) No results for input(s): PROBNP in the last 8760 hours. CBG:  Recent Labs Lab 03/21/14 0349 03/21/14 0808 03/21/14 1220 03/21/14 1627 03/21/14 2223  GLUCAP 201* 181* 210* 235* 258*    Recent Results (from the past 240 hour(s))  Urine culture     Status: None (Preliminary result)   Collection Time: 03/19/14  3:04 PM  Result Value Ref Range Status   Specimen Description URINE, CATHETERIZED  Final   Special Requests NONE  Final   Culture  Setup Time   Final    03/20/2014 00:29 Performed at 14[facility] Count   Final    >=100,000 COLONIES/ML Performed at [facility]    Culture   Final    GRAM NEGATIVE RODS Performed at [facility]    Report Status PENDING  Incomplete  Blood Culture (routine x 2)     Status: None   Collection Time: 03/19/14  3:23 PM  Result Value Ref Range Status   Specimen Description BLOOD RIGHT WRIST  Final   Special Requests BOTTLES DRAWN AEROBIC AND ANAEROBIC 5 ML  Final   Culture  Setup Time   Final    03/20/2014 07:48 Performed at 14[facility]    Culture   Final    STAPHYLOCOCCUS AUREUS Note: RIFAMPIN AND GENTAMICIN SHOULD NOT BE USED AS SINGLE DRUGS FOR TREATMENT OF STAPH INFECTIONS. Note: Gram Stain Report Called to,Read Back By and Verified With: FAYE S BY INGRAM A 03/20/14 11AM Performed at 14[facility]15    Report Status 03/22/2014 FINAL  Final   Organism ID, Bacteria STAPHYLOCOCCUS AUREUS  Final      Susceptibility   Staphylococcus aureus - MIC*    CLINDAMYCIN <=0.25 SENSITIVE Sensitive     ERYTHROMYCIN <=0.25 SENSITIVE Sensitive     GENTAMICIN <=0.5 SENSITIVE Sensitive     LEVOFLOXACIN <=0.12 SENSITIVE Sensitive     OXACILLIN 0.5  SENSITIVE Sensitive     PENICILLIN RESISTANT      RIFAMPIN <=0.5 SENSITIVE Sensitive     TRIMETH/SULFA <=10 SENSITIVE Sensitive     VANCOMYCIN <=0.5 SENSITIVE Sensitive     TETRACYCLINE <=1 SENSITIVE Sensitive     MOXIFLOXACIN <=0.25 SENSITIVE Sensitive     * STAPHYLOCOCCUS AUREUS  Blood Culture (routine x 2)     Status: None   Collection Time: 03/19/14  3:23 PM  Result Value Ref Range Status   Specimen Description BLOOD BLOOD LEFT FOREARM  Final   Special Requests BOTTLES DRAWN AEROBIC AND ANAEROBIC 5 ML  Final   Culture  Setup  Time   Final    03/20/2014 07:51 Performed at [facility]    Culture   Final    STAPHYLOCOCCUS AUREUS Note: SUSCEPTIBILITIES PERFORMED ON PREVIOUS CULTURE WITHIN THE LAST 5 DAYS. Note: Gram Stain Report Called to,Read Back By and Verified With: FAYE S BY INGRAM A 03/20/14 11AM Performed at [facility]    Report Status 03/22/2014 FINAL  Final         Studies: Dg Chest Port 1 View  03/21/2014   CLINICAL DATA:  Acute pulmonary edema. History of hypertension, diabetes and coronary artery disease. Initial encounter.  EXAM: PORTABLE CHEST - 1 VIEW  COMPARISON:  Radiographs 01/27/2014 and 03/20/2014.  FINDINGS: 1929 hr. The heart size and mediastinal contours are stable. There is persistent elevation of the left hemidiaphragm with increased left basilar airspace disease. The right lung is clear. There is no pneumothorax or significant pleural effusion. No acute osseous findings are demonstrated. Degenerative changes are present throughout the thoracic spine and at both shoulders. Telemetry leads overlie the chest.  IMPRESSION: Progressive left lower lobe airspace disease. Although in part chronic, this could reflect superimposed aspiration. No overt pulmonary edema.   Electronically Signed   By: Roxy Horseman M.D.   On: 03/21/2014 08:10   Dg Knee Left Port  03/20/2014   CLINICAL DATA:  Bilateral foot can knee pain, initial evaluation, multiple open  wounds due to bacterial infection, bilateral knee effusions  EXAM: PORTABLE LEFT KNEE - 1-2 VIEW  COMPARISON:  None.  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 The medial cortex of the medial tibial plateau demonstrates loss of definition. This is concerning for the possibility of osteomyelitis. The cortex along the medial margin of the distal medial femoral condyles is also indistinct. There is medial soft tissue swelling at the joint line.  IMPRESSION: The findings are concerning for cellulitis with underlying osteomyelitis medially.   Electronically Signed   By: Esperanza Heir M.D.   On: 03/20/2014 17:51   Dg Knee Right Port  03/20/2014   CLINICAL DATA:  Bilateral knee pain  EXAM: PORTABLE RIGHT KNEE - 1-2 VIEW  COMPARISON:  None.  FINDINGS: Severe tricompartmental degenerative change is noted. Small suprapatellar effusion. No fracture or dislocation. No radiopaque foreign body.  IMPRESSION: Severe tricompartmental degenerative change.   Electronically Signed   By: Christiana Pellant M.D.   On: 03/20/2014 17:51   Dg Foot Complete Left  03/20/2014   CLINICAL DATA:  Left heel wound  EXAM: LEFT FOOT - COMPLETE 3+ VIEW  COMPARISON:  None.  FINDINGS: There is extensive artifact from material overlying the foot which significantly decreases sensitivity and specificity for detection of osseous or soft tissue abnormalities. No grossly displaced fracture is identified. Bones are subjectively osteopenic. Dorsal soft tissue swelling is evident with plantar calcaneal spurring and vascular calcifications noted.  IMPRESSION: Suboptimal visualization due to support apparatus, but with nonspecific dorsal soft tissue swelling identified.   Electronically Signed   By: Christiana Pellant M.D.   On: 03/20/2014 17:07   Dg Foot Complete Right  03/20/2014   CLINICAL DATA:  Bilateral foot pain, open skin wound  EXAM: RIGHT FOOT COMPLETE - 3+ VIEW  COMPARISON:  None.  FINDINGS: Bones are subjectively  osteopenic. Positioning is suboptimal due to patient immobility and portable technique. Degenerative changes are noted at the midfoot. No fracture or dislocation allowing for positioning. Plantar calcaneal spurring noted. There is soft tissue swelling involving the dorsum of the foot and adjacent  to the lateral malleolus. No radiopaque foreign body.  IMPRESSION: Subjective osteopenia without acute osseous abnormality.  Dorsal soft tissue swelling and swelling over the lateral malleolus. Correlate for cellulitis or skin abnormalities in this area.   Electronically Signed   By: Christiana Pellant M.D.   On: 03/20/2014 17:51        Scheduled Meds: . antiseptic oral rinse  7 mL Mouth Rinse q12n4p  . aspirin  81 mg Oral Daily  . atorvastatin  20 mg Oral q1800  .  ceFAZolin (ANCEF) IV  2 g Intravenous Q8H  . chlorhexidine  15 mL Mouth Rinse BID  . collagenase   Topical Daily  . feeding supplement (GLUCERNA SHAKE)  237 mL Oral TID BM  . fludrocortisone  0.1 mg Oral Daily  . hydrocortisone sod succinate (SOLU-CORTEF) inj  50 mg Intravenous Q6H  . insulin aspart  0-9 Units Subcutaneous TID WC  . mirtazapine  30 mg Oral QHS  . sodium chloride  1,000 mL Intravenous Once  . sodium chloride  3 mL Intravenous Q12H  . vancomycin  1,250 mg Intravenous Q12H   Continuous Infusions: . heparin 1,450 Units/hr (03/22/14 0800)  . phenylephrine (NEO-SYNEPHRINE) Adult infusion 50 mcg/min (03/22/14 0800)    Active Problems:   Sepsis   DM2 (diabetes mellitus, type 2)   Physical deconditioning   Hypoalbuminemia   Hyponatremia   Elevated troponin   Severe sepsis   Acute UTI   Dehydration with hyponatremia   Protein-calorie malnutrition, severe   Diabetic foot ulcer   Sacral decubitus ulcer   Acute pulmonary edema   Effusion into joint   ST elevation myocardial infarction (STEMI) of inferolateral wall, initial episode of care   Severe sepsis with septic shock   Diabetic ulcer of left foot associated with  diabetes mellitus due to underlying condition   Osteomyelitis   Ulcer of heel   Acute osteomyelitis of femur   Septic arthritis of knee, left    Time spent: 40 minutes.    Marcellus Scott, MD, FACP, FHM. [facility] Pager [phone]  If 7PM-7AM, please contact night-coverage [email or web address] Password [email or web address] 03/22/2014, 10:21 AM    LOS: 3 days

## 2014-03-22 NOTE — Progress Notes (Signed)
ANTICOAGULATION CONSULT NOTE   Pharmacy Consult for heparin Indication: chest pain/ACS  Allergies  Allergen Reactions  . Penicillins Rash    Patient Measurements: Height: 5\' 4"  (162.6 cm) Weight: 194 lb 3.6 oz (88.1 kg) IBW/kg (Calculated) : 54.7 Heparin Dosing Weight: 77kg  Vital Signs: Temp: 98.2 F (36.8 C) (12/06 0437) Temp Source: Axillary (12/06 0437) BP: 97/49 mmHg (12/06 0437) Pulse Rate: 68 (12/06 0437)  Labs:  Recent Labs  03/19/14 1508 03/19/14 1944 03/20/14 0108 03/20/14 0820 03/21/14 0357 03/21/14 0954 03/21/14 1406 03/22/14 0314  HGB 7.9* 7.1* 7.0* 8.8* 8.7*  --   --  9.7*  HCT 24.7* 22.0* 21.9* 27.1* 26.9*  --   --  30.2*  PLT 77* 52* 48* 63* 81*  --   --  113*  LABPROT 16.2*  --   --   --   --  15.3*  --   --   INR 1.29  --   --   --   --  1.20  --   --   HEPARINUNFRC  --   --   --   --   --   --  <0.10* <0.10*  CREATININE 0.37* 0.32* 0.29*  --  0.25*  --   --  0.48*  TROPONINI  --  0.82* 7.66* >20.00*  --   --   --   --     Estimated Creatinine Clearance: 80.4 mL/min (by C-G formula based on Cr of 0.48).  Infusions:  . heparin 1,200 Units/hr (03/22/14 0400)  . phenylephrine (NEO-SYNEPHRINE) Adult infusion 30 mcg/min (03/22/14 0400)    Assessment: 58 yoF admitted 12/3 from home refusing care, wanting to hurt herself. Pt with recent admission in October for MSSA bacteremia and sepsis. Pt appeared septic on admission and was started on broad spectrum antibiotics per Pharmacy.   Mildly elevated troponin noted on admission, MD thought secondary to demand ischemia due to infectious process. Pt with critical troponin overnight 12/4, also with anemia, thrombocytopenia. MD notes ECG is unchanged from 10/14, minimal STE were present then. Cardiology recommended starting heparin 12/4, patient is poor cath candidate considering ongoing sepsis, anemia and thrombocytopenia. Heparin not started as patient has been asymptomatic.   Troponins continue to  increase, and Cardiology MD notes more changes on EKG. Pharmacy is now consulted to dose heparin without bolus for 48 hours.   Heparin level remains < 0.1 despite rate increase to  1200 units/hr   No issues with infusion or bleeding per RN  Hgb goal > 8.0 per MD, currently Hgb=9.7  plts low at 113k  Noted patient placed on aspirin 81mg  daily per cardiology as long as plt > 50k  Goal of Therapy:  Heparin level 0.3-0.7 units/ml Monitor platelets by anticoagulation protocol: Yes   Plan:  - Initial heparin level undetectable, no bolus per MD request, increase rate 1450 units/hr - heparin level in 6 hours - daily heparin level and CBC - monitor for bleeding   14/4, PharmD  03/22/2014 5:00 AM

## 2014-03-22 NOTE — Progress Notes (Signed)
ANTICOAGULATION CONSULT NOTE   Pharmacy Consult for heparin Indication: chest pain/ACS  Allergies  Allergen Reactions  . Penicillins Rash    Patient Measurements: Height: 5\' 4"  (162.6 cm) Weight: 194 lb 3.6 oz (88.1 kg) IBW/kg (Calculated) : 54.7 Heparin Dosing Weight: 77kg  Vital Signs: Temp: 97.6 F (36.4 C) (12/06 2101) Temp Source: Oral (12/06 2101) BP: 107/59 mmHg (12/06 2101) Pulse Rate: 65 (12/06 2101)  Labs:  Recent Labs  03/20/14 0108 03/20/14 0820 03/21/14 0357 03/21/14 0954  03/22/14 0314 03/22/14 1217 03/22/14 1943  HGB 7.0* 8.8* 8.7*  --   --  9.7*  --   --   HCT 21.9* 27.1* 26.9*  --   --  30.2*  --   --   PLT 48* 63* 81*  --   --  113*  --   --   LABPROT  --   --   --  15.3*  --   --   --   --   INR  --   --   --  1.20  --   --   --   --   HEPARINUNFRC  --   --   --   --   < > <0.10* 0.47 0.51  CREATININE 0.29*  --  0.25*  --   --  0.48*  --   --   TROPONINI 7.66* >20.00*  --   --   --   --   --   --   < > = values in this interval not displayed.  Estimated Creatinine Clearance: 80.4 mL/min (by C-G formula based on Cr of 0.48).  Infusions:  . heparin 1,450 Units/hr (03/22/14 0800)  . phenylephrine (NEO-SYNEPHRINE) Adult infusion 38 mcg/min (03/22/14 1833)    Assessment: 58 yoF admitted 12/3 from home refusing care, wanting to hurt herself. Pt with recent admission in October for MSSA bacteremia and sepsis. Pt appeared septic on admission and was started on broad spectrum antibiotics per Pharmacy.   Mildly elevated troponin noted on admission, MD thought secondary to demand ischemia due to infectious process. Pt with critical troponin overnight 12/4, also with anemia, thrombocytopenia. MD notes ECG is unchanged from 10/14, minimal STE were present then. Cardiology recommended starting heparin 12/4, patient is poor cath candidate considering ongoing sepsis, anemia and thrombocytopenia. Heparin not started as patient has been asymptomatic.    Troponins continue to increase, and Cardiology MD notes more changes on EKG. Pharmacy is now consulted to dose heparin without bolus for 48 hours.   Heparin level remains within therapeutic range x 2 levels with most recent level = 0.51 on 1450 units/hr   No complications of therapy noted  Hgb goal > 8.0 per MD, currently Hgb=9.7  plts low at 113k  Noted patient placed on aspirin 81mg  daily per cardiology as long as plt > 50k  Goal of Therapy:  Heparin level 0.3-0.7 units/ml Monitor platelets by anticoagulation protocol: Yes   Plan:  - continue heparin gtt at 1450 units/hr - daily heparin level and CBC - monitor for bleeding  Thank you for the consult.  14/4, PharmD 03/22/2014 9:35 PM

## 2014-03-22 NOTE — Progress Notes (Addendum)
PULMONARY / CRITICAL CARE MEDICINE   Name: Robin Schroeder MRN: [id number] DOB: 02/02/1956    ADMISSION DATE:  03/19/2014 CONSULTATION DATE:  03/20/14  REFERRING MD :  Dr. Waymon Amato   CHIEF COMPLAINT:  Sepsis   INITIAL PRESENTATION: 58 y/o F with multiple medical problems and recent d/c in 01/2014 for MSSA bacteremia & sepsis admitted on 12/3 for decreased intake, concerns of intentional self harm and decubitus ulcers.  Persistently hypotensive and PCCM consulted for evaluation.   STUDIES:  12/04  ECHO >>   SIGNIFICANT EVENTS: 10/13-10/21/15  Admit with MSSA bacteremia.  NEG TEE for vegetations.  Work up + for RA.   10/30  Completed IV Cefazolin, PICC line d/c'd  11/02  Followed up with Dr. Drue Second, rx'd with 5mg  QD pred for RA sx  12/03  Admit with hypotension, decreased intake, concerns of intentional self harm (IVC'd by husband on admit) and decubitus ulcers. 12/04  Persistent hypotension, Stress steroids initiated and PCCM consulted for evaluation  SUBJECTIVE:  Remains hypotensive on   VITAL SIGNS: Temp:  [97.8 F (36.6 C)-98.8 F (37.1 C)] 97.8 F (36.6 C) (12/06 0753) Pulse Rate:  [57-87] 57 (12/06 0630) Resp:  [16-35] 19 (12/06 0630) BP: (78-133)/(27-92) 89/43 mmHg (12/06 0630) SpO2:  [93 %-100 %] 99 % (12/06 0630) Weight:  [88.1 kg (194 lb 3.6 oz)] 88.1 kg (194 lb 3.6 oz) (12/06 0437)   HEMODYNAMICS:     VENTILATOR SETTINGS:     INTAKE / OUTPUT:  Intake/Output Summary (Last 24 hours) at 03/22/14 1004 Last data filed at 03/22/14 0800  Gross per 24 hour  Intake 2841.67 ml  Output    282 ml  Net 2559.67 ml    PHYSICAL EXAMINATION: General:  No distress, depressed affect Neuro:  nonfocal uppers, perrl HEENT:  Obese, no jvd Cardiovascular:  s1 s2 RRR, distant Lungs:  Coarse, cough clear Abdomen:  Echymosis, no r/g, soft, nontener Musculoskeletal:  Back no erythema cerv to lumbar Skin:  brusing inner thighs, knees bilateral, deformed rt leg  laterally  LABS:  CBC  Recent Labs Lab 03/20/14 0820 03/21/14 0357 03/22/14 0314  WBC 4.0 5.4 6.7  HGB 8.8* 8.7* 9.7*  HCT 27.1* 26.9* 30.2*  PLT 63* 81* 113*   Coag's  Recent Labs Lab 03/19/14 1508 03/21/14 0954  INR 1.29 1.20   BMET  Recent Labs Lab 03/20/14 0108 03/21/14 0357 03/22/14 0314  NA 129* 135* 133*  K 3.2* 3.7 4.0  CL 98 105 103  CO2 22 21 18*  BUN 17 15 24*  CREATININE 0.29* 0.25* 0.48*  GLUCOSE 150* 218* 276*   Electrolytes  Recent Labs Lab 03/20/14 0108 03/20/14 0820 03/21/14 0357 03/22/14 0314  CALCIUM 6.5*  --  7.4* 7.4*  MG  --  1.6  --  1.8  PHOS  --   --   --  1.9*   Sepsis Markers  Recent Labs Lab 03/19/14 1542 03/19/14 1719 03/20/14 0820  LATICACIDVEN 2.52* 1.85 1.6   ABG  Recent Labs Lab 03/19/14 1523  PHART 7.484*  PCO2ART 40.8  PO2ART 78.7*   Liver Enzymes  Recent Labs Lab 03/19/14 1508 03/20/14 0108 03/21/14 0357  AST 46* 60* 72*  ALT 12 11 15   ALKPHOS 219* 157* 203*  BILITOT 1.6* 1.0 1.0  ALBUMIN 1.2* 0.9* 1.0*   Cardiac Enzymes  Recent Labs Lab 03/19/14 1944 03/20/14 0108 03/20/14 0820  TROPONINI 0.82* 7.66* >20.00*   Glucose  Recent Labs Lab 03/20/14 2303 03/21/14 0349 03/21/14 0808 03/21/14  1220 03/21/14 1627 03/21/14 2223  GLUCAP 180* 201* 181* 210* 235* 258*   Imaging Dg Chest Port 1 View  03/21/2014   CLINICAL DATA:  Acute pulmonary edema. History of hypertension, diabetes and coronary artery disease. Initial encounter.  EXAM: PORTABLE CHEST - 1 VIEW  COMPARISON:  Radiographs 01/27/2014 and 03/20/2014.  FINDINGS: 1929 hr. The heart size and mediastinal contours are stable. There is persistent elevation of the left hemidiaphragm with increased left basilar airspace disease. The right lung is clear. There is no pneumothorax or significant pleural effusion. No acute osseous findings are demonstrated. Degenerative changes are present throughout the thoracic spine and at both  shoulders. Telemetry leads overlie the chest.  IMPRESSION: Progressive left lower lobe airspace disease. Although in part chronic, this could reflect superimposed aspiration. No overt pulmonary edema.   Electronically Signed   By: Roxy Horseman M.D.   On: 03/21/2014 08:10   ASSESSMENT / PLAN:  PULMONARY OETT n/a A: Mild Pulmonary Edema - noted on CXR, small effusion P:   Oxygen to support sats > 92% Pulmonary hygiene:  IS, Mobilize Over 7 liters up, refusing cvp line, kvo, remains positive pCXR in am for volume status  CARDIOVASCULAR CVL n/a A:  Severe Sepsis / Septic Shock - in setting of probable bacteremia, GPC's in clusters on prelim BC's +/- component of adrenal insufficiency  Acute Coronary Syndrome - troponin > 20, EKG without change (minimal STE) HLD P:  Cardiology following Stress dose steroids adjust to 50 q6h Continue florinef oral Trend EKG Unable to receive heparin / ASA due to thrombocytopenia / anemia Assess ECHO for vegetation / myocarditis   Neosynephrine for MAP > 60 through peripheral line and patient is aware of risk, will place cap at 100 mcg. Lipitor.  RENAL A:   Hyponatremia  Hypochloremia  hypoK AI UOP dropping overnight, likely due to septic shock P:   KVO IVF Echo Chem in am  Unable to assess cvp since patient is refusing central line placement Will give another liter bolus.  GASTROINTESTINAL A:   Protein Calorie Malnutrition  Nausea  P:   Nutrition consult appreciated Liberalize diet given code status change.  Ensure TID  Diet as tolerated  PRN zofran  HEMATOLOGIC A:   Severe Thrombocytopenia - sepsis / bacteremia Anemia P:  Trend CBC Tx for Hgb <8% (ACS) or active bleeding   INFECTIOUS A:   Septic Shock - GPC's in clusters on prelim BC's  - source unclear, r/o bacteremia from endocarditis, decub (unlikely), joint involvement seeding? Decubitus Ulcers - sacral ?L knee osteo P:   BCx2 12/03 >> GPC's in clusters >> UC 12/03  >>  Abx: Vanco, start date 12/3>>> WOC consult  Recommendations per ID Echo important for veg now, may need TEE if neg TTE, TTE done and pending. May need CT abdo/pelvis for seeding abscess ( clinically not supported) but would like hemodynamics to be more stable.  ENDOCRINE A:   Hyperglycemia  Adrenal Insufficiency  P:   Stress steroids  SSI Continue florinef  NEUROLOGIC A:   Chronic Pain Rheumatoid Arthritis  P:   Minimize narcotics as able Baseline 5mg  prednisone (started 02/16/14) - to stress roids Xray joints  FAMILY  - Updates: patient and husband updated bedside.  Not even a central line.  After discussion, will make patient LCB with pressors only and only neosynephrine up to 100 mcg given peripheral infusion.  - Inter-disciplinary family meet or Palliative Care meeting due by: 12/11  The patient is critically ill  with multiple organ systems failure and requires high complexity decision making for assessment and support, frequent evaluation and titration of therapies, application of advanced monitoring technologies and extensive interpretation of multiple databases.   Critical Care Time devoted to patient care services described in this note is  35  Minutes. This time reflects time of care of this signee Dr Koren Bound. This critical care time does not reflect procedure time, or teaching time or supervisory time of PA/NP/Med student/Med Resident etc but could involve care discussion time.  Alyson Reedy, M.D. [facility] Pulmonary/Critical Care Medicine. Pager: [phone]. After hours pager: [phone].  03/22/2014, 10:04 AM

## 2014-03-22 NOTE — Progress Notes (Signed)
[facility]       Day # 4 vancomycin Day # 2 cefazolin  Also had 2 days aztreonam, 1 day levaquin  Subjective:  Feels better   Antibiotics:  Anti-infectives    Start     Dose/Rate Route Frequency Ordered Stop   03/21/14 1200  ceFAZolin (ANCEF) IVPB 2 g/50 mL premix     2 g100 mL/hr over 30 Minutes Intravenous Every 8 hours 03/21/14 1051     03/21/14 1041  vancomycin (VANCOCIN) 1 GM/200ML IVPB    Comments:  Robin Schroeder   : cabinet override      03/21/14 1041 03/21/14 2244   03/20/14 1600  levofloxacin (LEVAQUIN) IVPB 750 mg  Status:  Discontinued     750 mg100 mL/hr over 90 Minutes Intravenous Every 24 hours 03/19/14 1629 03/19/14 1709   03/20/14 0000  vancomycin (VANCOCIN) 1,250 mg in sodium chloride 0.9 % 250 mL IVPB  Status:  Discontinued     1,250 mg166.7 mL/hr over 90 Minutes Intravenous Every 12 hours 03/19/14 1627 03/22/14 1238   03/20/14 0000  aztreonam (AZACTAM) 2 g in dextrose 5 % 50 mL IVPB  Status:  Discontinued     2 g100 mL/hr over 30 Minutes Intravenous Every 8 hours 03/19/14 1628 03/20/14 1147   03/19/14 1445  levofloxacin (LEVAQUIN) IVPB 750 mg     750 mg100 mL/hr over 90 Minutes Intravenous  Once 03/19/14 1435 03/19/14 1708   03/19/14 1445  aztreonam (AZACTAM) 2 g in dextrose 5 % 50 mL IVPB     2 g100 mL/hr over 30 Minutes Intravenous  Once 03/19/14 1435 03/19/14 1708   03/19/14 1445  vancomycin (VANCOCIN) IVPB 1000 mg/200 mL premix     1,000 mg200 mL/hr over 60 Minutes Intravenous  Once 03/19/14 1435 03/19/14 1707      Medications: Scheduled Meds: . antiseptic oral rinse  7 mL Mouth Rinse q12n4p  . aspirin  81 mg Oral Daily  . atorvastatin  20 mg Oral q1800  .  ceFAZolin (ANCEF) IV  2 g Intravenous Q8H  . chlorhexidine  15 mL Mouth Rinse BID  . collagenase   Topical Daily  . feeding supplement (GLUCERNA SHAKE)  237 mL Oral TID BM  . fludrocortisone  0.1 mg Oral Daily  . hydrocortisone sod succinate (SOLU-CORTEF) inj   50 mg Intravenous Q6H  . insulin aspart  0-5 Units Subcutaneous QHS  . insulin aspart  0-9 Units Subcutaneous TID WC  . insulin glargine  10 Units Subcutaneous Daily  . mirtazapine  30 mg Oral QHS  . sodium chloride  3 mL Intravenous Q12H   Continuous Infusions: . heparin 1,450 Units/hr (03/22/14 0800)  . phenylephrine (NEO-SYNEPHRINE) Adult infusion 38 mcg/min (03/22/14 1833)   PRN Meds:.sodium chloride, acetaminophen **OR** acetaminophen, chlorpheniramine-HYDROcodone, ondansetron **OR** ondansetron (ZOFRAN) IV, sodium chloride    Objective: Weight change:   Intake/Output Summary (Last 24 hours) at 03/22/14 1853 Last data filed at 03/22/14 1833  Gross per 24 hour  Intake 2701.65 ml  Output    170 ml  Net 2531.65 ml   Blood pressure 130/70, pulse 59, temperature 97.7 F (36.5 C), temperature source Oral, resp. rate 22, height 5\' 4"  (1.626 m), weight 194 lb 3.6 oz (88.1 kg), SpO2 100 %. Temp:  [97.7 F (36.5 C)-98.8 F (37.1 C)] 97.7 F (36.5 C) (12/06 1604) Pulse Rate:  [53-77] 59 (12/06 1800) Resp:  [18-35] 22 (12/06 1800) BP: (78-135)/(40-92) 130/70 mmHg (12/06 1800) SpO2:  [95 %-100 %]  100 % (12/06 1800) Weight:  [194 lb 3.6 oz (88.1 kg)] 194 lb 3.6 oz (88.1 kg) (12/06 0437)  Physical Exam: HEENT: EOMI, oropharynx clear and without exudate CVS regular rate, normal r, no murmur rubs or gallops Chest: clear to auscultation bilaterally, no wheezing, rales or rhonchi Abdomen: soft nontender, nondistended, normal bowel sounds, Extremities: sklin: multiple ecchymoses, petechiae See pix from yesterday, does have pain about knee and calf Neuro: nonfocal  CBC:  Recent Labs Lab 03/19/14 1508 03/19/14 1944 03/20/14 0108 03/20/14 0820 03/21/14 0357 03/21/14 0954 03/22/14 0314  HGB 7.9* 7.1* 7.0* 8.8* 8.7*  --  9.7*  HCT 24.7* 22.0* 21.9* 27.1* 26.9*  --  30.2*  PLT 77* 52* 48* 63* 81*  --  113*  INR 1.29  --   --   --   --  1.20  --      BMET  Recent Labs   03/21/14 0357 03/22/14 0314  NA 135* 133*  K 3.7 4.0  CL 105 103  CO2 21 18*  GLUCOSE 218* 276*  BUN 15 24*  CREATININE 0.25* 0.48*  CALCIUM 7.4* 7.4*     Liver Panel   Recent Labs  03/20/14 0108 03/21/14 0357  PROT 4.9* 5.4*  ALBUMIN 0.9* 1.0*  AST 60* 72*  ALT 11 15  ALKPHOS 157* 203*  BILITOT 1.0 1.0       Sedimentation Rate  Recent Labs  03/21/14 0357  ESRSEDRATE 90*   C-Reactive Protein  Recent Labs  03/21/14 0357  CRP 16.1*    Micro Results: Recent Results (from the past 720 hour(s))  Urine culture     Status: None (Preliminary result)   Collection Time: 03/19/14  3:04 PM  Result Value Ref Range Status   Specimen Description URINE, CATHETERIZED  Final   Special Requests NONE  Final   Culture  Setup Time   Final    03/20/2014 00:29 Performed at [facility] Count   Final    >=100,000 COLONIES/ML Performed at [facility]    Culture   Final    GRAM NEGATIVE RODS Performed at [facility]    Report Status PENDING  Incomplete  Blood Culture (routine x 2)     Status: None   Collection Time: 03/19/14  3:23 PM  Result Value Ref Range Status   Specimen Description BLOOD RIGHT WRIST  Final   Special Requests BOTTLES DRAWN AEROBIC AND ANAEROBIC 5 ML  Final   Culture  Setup Time   Final    03/20/2014 07:48 Performed at [facility]    Culture   Final    STAPHYLOCOCCUS AUREUS Note: RIFAMPIN AND GENTAMICIN SHOULD NOT BE USED AS SINGLE DRUGS FOR TREATMENT OF STAPH INFECTIONS. Note: Gram Stain Report Called to,Read Back By and Verified With: FAYE S BY INGRAM A 03/20/14 11AM Performed at [facility]    Report Status 03/22/2014 FINAL  Final   Organism ID, Bacteria STAPHYLOCOCCUS AUREUS  Final      Susceptibility   Staphylococcus aureus - MIC*    CLINDAMYCIN <=0.25 SENSITIVE Sensitive     ERYTHROMYCIN <=0.25 SENSITIVE Sensitive     GENTAMICIN <=0.5 SENSITIVE Sensitive     LEVOFLOXACIN <=0.12  SENSITIVE Sensitive     OXACILLIN 0.5 SENSITIVE Sensitive     PENICILLIN RESISTANT      RIFAMPIN <=0.5 SENSITIVE Sensitive     TRIMETH/SULFA <=10 SENSITIVE Sensitive     VANCOMYCIN <=0.5 SENSITIVE Sensitive  TETRACYCLINE <=1 SENSITIVE Sensitive     MOXIFLOXACIN <=0.25 SENSITIVE Sensitive     * STAPHYLOCOCCUS AUREUS  Blood Culture (routine x 2)     Status: None   Collection Time: 03/19/14  3:23 PM  Result Value Ref Range Status   Specimen Description BLOOD BLOOD LEFT FOREARM  Final   Special Requests BOTTLES DRAWN AEROBIC AND ANAEROBIC 5 ML  Final   Culture  Setup Time   Final    03/20/2014 07:51 Performed at [facility]    Culture   Final    STAPHYLOCOCCUS AUREUS Note: SUSCEPTIBILITIES PERFORMED ON PREVIOUS CULTURE WITHIN THE LAST 5 DAYS. Note: Gram Stain Report Called to,Read Back By and Verified With: FAYE S BY INGRAM A 03/20/14 11AM Performed at [facility]    Report Status 03/22/2014 FINAL  Final  Culture, blood (routine x 2)     Status: None (Preliminary result)   Collection Time: 03/20/14  5:30 PM  Result Value Ref Range Status   Specimen Description BLOOD RIGHT HAND  Final   Special Requests BOTTLES DRAWN AEROBIC ONLY 8CC  Final   Culture  Setup Time   Final    03/21/2014 01:05 Performed at [facility]    Culture   Final           BLOOD CULTURE RECEIVED NO GROWTH TO DATE CULTURE WILL BE HELD FOR 5 DAYS BEFORE ISSUING A FINAL NEGATIVE REPORT Performed at [facility]    Report Status PENDING  Incomplete  Culture, blood (routine x 2)     Status: None (Preliminary result)   Collection Time: 03/20/14  5:37 PM  Result Value Ref Range Status   Specimen Description BLOOD LEFT ARM  Final   Special Requests   Final    BOTTLES DRAWN AEROBIC ONLY BLUE BOTTLE 9 CC, RED BOTTLE 1 CC   Culture  Setup Time   Final    03/21/2014 01:06 Performed at [facility]    Culture   Final           BLOOD CULTURE RECEIVED NO GROWTH TO DATE  CULTURE WILL BE HELD FOR 5 DAYS BEFORE ISSUING A FINAL NEGATIVE REPORT Performed at [facility]    Report Status PENDING  Incomplete    Studies/Results: Dg Chest Port 1 View  03/21/2014   CLINICAL DATA:  Acute pulmonary edema. History of hypertension, diabetes and coronary artery disease. Initial encounter.  EXAM: PORTABLE CHEST - 1 VIEW  COMPARISON:  Radiographs 01/27/2014 and 03/20/2014.  FINDINGS: 1929 hr. The heart size and mediastinal contours are stable. There is persistent elevation of the left hemidiaphragm with increased left basilar airspace disease. The right lung is clear. There is no pneumothorax or significant pleural effusion. No acute osseous findings are demonstrated. Degenerative changes are present throughout the thoracic spine and at both shoulders. Telemetry leads overlie the chest.  IMPRESSION: Progressive left lower lobe airspace disease. Although in part chronic, this could reflect superimposed aspiration. No overt pulmonary edema.   Electronically Signed   By: Roxy Horseman M.D.   On: 03/21/2014 08:10      Assessment/Plan:  Active Problems:   DM2 (diabetes mellitus, type 2)   Physical deconditioning   Hypoalbuminemia   Hyponatremia   Severe sepsis   Acute UTI   Protein-calorie malnutrition, severe   Sacral decubitus ulcer   Acute pulmonary edema   Effusion into joint   ST elevation myocardial infarction (STEMI) of inferolateral wall, initial episode of care  Severe sepsis with septic shock   Diabetic ulcer of left foot associated with diabetes mellitus due to underlying condition   Osteomyelitis   Ulcer of heel   Acute osteomyelitis of femur   Septic arthritis of knee, left   STEMI (ST elevation myocardial infarction)    Robin Schroeder is a 58 y.o. 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 SAB with septic shock  possible osteomyelitis of femur     [facility] Antimicrobial Management Team Staphylococcus aureus bacteremia   Staphylococcus aureus bacteremia (SAB) is associated with a high rate of complications and mortality.  Specific aspects of clinical management are critical to optimizing the outcome of patients with SAB.  Therefore, the [facility] Antimicrobial Management Team [facility]) has initiated an intervention aimed at improving the management of SAB at [facility].  To do so, Infectious Diseases physicians are providing an evidence-based consult for the management of all patients with SAB.     Yes No Comments  Perform follow-up blood cultures (even if the patient is afebrile) to ensure clearance of bacteremia [x]  []  03/20/14 cultures collected  Remove vascular catheter and obtain follow-up blood cultures after the removal of the catheter [x]  []  PICC line is out from 03/19/14 DO NOT PLACE long term access IF at all possible until we have proven clearance of her bacteremia  Perform echocardiography to evaluate for endocarditis (transthoracic ECHO is 40-50% sensitive, TEE is > 90% sensitive) [x]  []  Please keep in mind, that neither test can definitively EXCLUDE endocarditis, and that should clinical suspicion remain high for endocarditis the patient should then still be treated with an "endocarditis" duration of therapy = 6 weeks TTE negative, she is too sick for TEE  Consult electrophysiologist to evaluate implanted cardiac device (pacemaker, ICD) []  []  NA  Ensure source control []  []  Have all abscesses been drained effectively? Have deep seeded infections (septic joints or osteomyelitis) had appropriate surgical debridement?  NO THERE IS CONCERN FOR SEPTIC KNEE AND OSTEO OF FEMUR  Investigate for "metastatic" sites of infection []  []  Does the patient have ANY symptom or physical exam finding that would suggest a deeper infection (back or neck pain that may be suggestive of vertebral osteomyelitis or  epidural abscess, muscle pain that could be a symptom of pyomyositis)?  Keep in mind that for deep seeded infections MRI imaging with contrast is preferred rather than other often insensitive tests such as plain x-rays, especially early in a patient's presentation.  WHEN STABLE WILL TRY TO GET MRI OF KNEE INCLUDING DISTAL FEMUR, TIB, FIB AND ANKLE  Change antibiotic therapy to ANCEF ALONE [x]  []  Beta-lactam antibiotics are preferred for MSSA due to higher cure rates.   If on Vancomycin, goal trough should be 15 - 20 mcg/mL  Estimated duration of IV antibiotic therapy:    8 weeks if she survives and we can get source control []  []  Consult case management for probably prolonged outpatient IV antibiotic therapy    #2 Likely septic knee and osteo of femur: when STABLE patient will need MRI of the knee and femur and will need Orthopedics consult  #3 DFU left: patient also will need an MRI foot and ankle  Dr. 14/4/15 is back tomorrow.    LOS: 3 days   03/22/2014, 6:53 PM

## 2014-03-22 NOTE — Clinical Social Work Psychosocial (Signed)
Clinical Social Work Department BRIEF PSYCHOSOCIAL ASSESSMENT 03/22/2014  Patient:  Robin Schroeder, Robin Schroeder     Account Number:  [id number]     Admit date:  03/19/2014  Clinical Social Worker:  Dede Query, CLINICAL SOCIAL WORKER  Date/Time:  03/22/2014 02:26 PM  Referred by:  Physician  Date Referred:  03/20/2014 Referred for  Other - See comment   Other Referral:   medication and or possible snf   Interview type:  Patient Other interview type:    PSYCHOSOCIAL DATA Living Status:  HUSBAND Admitted from facility:   Level of care:   Primary support name:  Rosario Primary support relationship to patient:  SPOUSE Degree of support available:   low    CURRENT CONCERNS  Other Concerns:    SOCIAL WORK ASSESSMENT / PLAN CSW met with pt to assess for services  CSW reviewed psychiatric consultation which reflected that pt does not meet criteria for inpatient psychiatric facility but would benefit from anti depressant medication treatment.  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 Pt stated that since her fall she has not been able to walk very well and this has made her mood "not as good as it used to be".  Pt stated that she had tried to do some rehab at [facility] but that she felt like she was not cared for there and would never go back there.  Pt stated that she had just finished her lunch and that it was good.  Pt stated that she would be open to all other facilities in [facility] except for [facility] if her doctors state that she needs to go someone to build up her strength.  Pt stated that she likes to read in her spare time.   Assessment/plan status:   Other assessment/ plan:   Information/referral to community resources:    PATIENT'S/FAMILY'S RESPONSE TO PLAN OF CARE: Per pt's RN PT evaluation can not take place for awhile due to Pt's current  medical issues.  Pt very open to talking with CSW and stated she was open to another SNF if necessary but would not go to [place] had just finished her lunch and was alert while conversing with CSW but quickly got tired and asked "can I rest now" so CSW ended the interview.  CSW will continue to monitor for PT evaluation to assess for needed services.    Dede Query, LCSW [place] Worker - Weekend Coverage cell #: [phone]

## 2014-03-22 NOTE — Plan of Care (Signed)
Problem: Phase I Progression Outcomes Goal: Hemodynamically stable Outcome: Progressing Pt is still on Neosynephrine drip, but has tolerated weaning. From 50 mcg to 35 mcg today.

## 2014-03-23 ENCOUNTER — Inpatient Hospital Stay (HOSPITAL_COMMUNITY): Payer: Medicaid Other

## 2014-03-23 DIAGNOSIS — M130 Polyarthritis, unspecified: Secondary | ICD-10-CM

## 2014-03-23 DIAGNOSIS — N179 Acute kidney failure, unspecified: Secondary | ICD-10-CM

## 2014-03-23 DIAGNOSIS — R601 Generalized edema: Secondary | ICD-10-CM

## 2014-03-23 DIAGNOSIS — I213 ST elevation (STEMI) myocardial infarction of unspecified site: Secondary | ICD-10-CM

## 2014-03-23 DIAGNOSIS — L89153 Pressure ulcer of sacral region, stage 3: Secondary | ICD-10-CM

## 2014-03-23 DIAGNOSIS — E872 Acidosis, unspecified: Secondary | ICD-10-CM | POA: Insufficient documentation

## 2014-03-23 DIAGNOSIS — L8990 Pressure ulcer of unspecified site, unspecified stage: Secondary | ICD-10-CM

## 2014-03-23 LAB — GLUCOSE, CAPILLARY
GLUCOSE-CAPILLARY: 188 mg/dL — AB (ref 70–99)
GLUCOSE-CAPILLARY: 120 mg/dL — AB (ref 70–99)
Glucose-Capillary: 271 mg/dL — ABNORMAL HIGH (ref 70–99)
Glucose-Capillary: 253 mg/dL — ABNORMAL HIGH (ref 70–99)
Glucose-Capillary: 254 mg/dL — ABNORMAL HIGH (ref 70–99)
Glucose-Capillary: 234 mg/dL — ABNORMAL HIGH (ref 70–99)
Glucose-Capillary: 232 mg/dL — ABNORMAL HIGH (ref 70–99)
Glucose-Capillary: 221 mg/dL — ABNORMAL HIGH (ref 70–99)

## 2014-03-23 LAB — CBC
HCT: 30.7 % — ABNORMAL LOW (ref 36.0–46.0)
HEMOGLOBIN: 9.9 g/dL — AB (ref 12.0–15.0)
MCH: 27.3 pg (ref 26.0–34.0)
MCHC: 32.2 g/dL (ref 30.0–36.0)
MCV: 84.8 fL (ref 78.0–100.0)
PLATELETS: 108 10*3/uL — AB (ref 150–400)
RBC: 3.62 MIL/uL — ABNORMAL LOW (ref 3.87–5.11)
RDW: 16.7 % — AB (ref 11.5–15.5)
WBC: 7.7 10*3/uL (ref 4.0–10.5)

## 2014-03-23 LAB — BASIC METABOLIC PANEL
ANION GAP: 15 (ref 5–15)
BUN: 30 mg/dL — ABNORMAL HIGH (ref 6–23)
CO2: 16 mEq/L — ABNORMAL LOW (ref 19–32)
CREATININE: 0.69 mg/dL (ref 0.50–1.10)
Calcium: 7.6 mg/dL — ABNORMAL LOW (ref 8.4–10.5)
Chloride: 101 mEq/L (ref 96–112)
GFR calc non Af Amer: >90 mL/min (ref >90)
Glucose, Bld: 239 mg/dL — ABNORMAL HIGH (ref 70–99)
Potassium: 3.9 mEq/L (ref 3.7–5.3)
SODIUM: 132 meq/L — AB (ref 137–147)

## 2014-03-23 LAB — PHOSPHORUS: Phosphorus: 2.7 mg/dL (ref 2.3–4.6)

## 2014-03-23 LAB — MAGNESIUM: MAGNESIUM: 1.9 mg/dL (ref 1.5–2.5)

## 2014-03-23 LAB — URINE CULTURE

## 2014-03-23 LAB — HEPARIN LEVEL (UNFRACTIONATED): HEPARIN UNFRACTIONATED: 0.75 [IU]/mL — AB (ref 0.30–0.70)

## 2014-03-23 MED ORDER — HEPARIN (PORCINE) IN NACL 100-0.45 UNIT/ML-% IJ SOLN
1200.0000 [IU]/h | INTRAMUSCULAR | Status: DC
  Administered 2014-03-23: 1200 [IU]/h via INTRAVENOUS
  Filled 2014-03-23: qty 250

## 2014-03-23 MED ORDER — MIRTAZAPINE 15 MG PO TBDP
15.0000 mg | ORAL_TABLET | Freq: Every day | ORAL | Status: DC
Start: 2014-03-23 — End: 2014-04-06
  Administered 2014-03-23 – 2014-04-04 (×11): 15 mg via ORAL
  Filled 2014-03-23 (×18): qty 1

## 2014-03-23 MED ORDER — HYDROCORTISONE NA SUCCINATE PF 100 MG IJ SOLR
50.0000 mg | Freq: Two times a day (BID) | INTRAMUSCULAR | Status: DC
  Administered 2014-03-23 – 2014-03-24 (×3): 50 mg via INTRAVENOUS
  Filled 2014-03-23 (×3): qty 2

## 2014-03-23 MED ORDER — INSULIN GLARGINE 100 UNIT/ML ~~LOC~~ SOLN
15.0000 [IU] | Freq: Every day | SUBCUTANEOUS | Status: DC
Start: 2014-03-23 — End: 2014-03-27
  Administered 2014-03-23 – 2014-03-27 (×5): 15 [IU] via SUBCUTANEOUS
  Filled 2014-03-23 (×5): qty 0.15

## 2014-03-23 MED ORDER — ENOXAPARIN SODIUM 40 MG/0.4ML ~~LOC~~ SOLN
40.0000 mg | SUBCUTANEOUS | Status: DC
  Administered 2014-03-23 – 2014-03-24 (×2): 40 mg via SUBCUTANEOUS
  Filled 2014-03-23 (×2): qty 0.4

## 2014-03-23 MED ORDER — SODIUM BICARBONATE 650 MG PO TABS
650.0000 mg | ORAL_TABLET | Freq: Three times a day (TID) | ORAL | Status: DC
  Administered 2014-03-23 – 2014-03-31 (×18): 650 mg via ORAL
  Filled 2014-03-23 (×30): qty 1

## 2014-03-23 MED ORDER — OLANZAPINE 5 MG PO TBDP
2.5000 mg | ORAL_TABLET | Freq: Every day | ORAL | Status: DC
  Filled 2014-03-23 (×3): qty 0.5

## 2014-03-23 NOTE — Consult Note (Signed)
[facility] Face-to-Face Psychiatry Consult   Reason for Consult:  Capacity evaluation Referring Physician:  Dr. [name] is an 58 y.o. female. Total Time spent with patient: 45 minutes  Assessment: AXIS I:  Mood Disorder NOS AXIS II:  Deferred AXIS III:   Past Medical History  Diagnosis Date  . Hypertension   . Diabetes mellitus without complication   . Arthritis   . Coronary artery disease     pt unaware   AXIS IV:  other psychosocial or environmental problems, problems related to social environment and problems with primary support group AXIS V:  51-60 moderate symptoms  Plan: Patient has capacity to make her own medical decisions and living arrangement Discontinue IVC and sitter as she has no safety concerns at this time Decrease Remeron 15 mg PO Qhs and add Zyprexa 2.5 mg PO Qhs for better appetite No evidence of imminent risk to self or others at present.   Patient does not meet criteria for psychiatric inpatient admission. Supportive therapy provided about ongoing stressors.  Appreciate psychiatric consultation and follow up as clinically required Please contact [phone] or [phone] if needs further assistance  Subjective:   Robin Schroeder is a 58 y.o. female patient admitted with refuses eating and drinking.  HPI:  This is a 58 year old female seen and chart reviewed. Patient has two sister's at bed side when walked into her room, patient asked them to step out for this evaluation. She lives with her husband who is not in hospital at this time. She wishes to see her brother while in hospital. Patient is seen with Sindy Messing, LCSW for psych consultation for capacity evaluation and possible suicidal ideations. Patient stated that she wants straighten up and go home. She has denied depression, anxiety, and psychosis. She has denied suicidal or homicidal ideation, intention or psychosis. She has fair insight into her medical and clinical condition and upset when  family concern about her not eating and saying why she should eat when she was no hungry and stated that she is eating every day as much as she can.   Medical history: patient with multiple medical problems who was discharged from the hospital in October after she was treated for MSSA bacteremia with sepsis. She was found to have MSSA bacteremia on 10/13, blood cultures cleared on 10/16 and she was placed on cefazolin. Patient underwent TEE on 1019 which was negative for vegetations. Patient was discharged to skilled nursing facility for IV antibiotics and rehabilitation. Patient is a very poor historian, and husband is not at bedside. Patient's sister is at bedside. So most of the history obtained from sister and ED physician. As per patient's husband, patient did not want to eat or drink and she is not eating and drinking for past 5 days and so he got concerned that she wants to hurt herself. So he filled out IVC and patient was brought by the EMS. Patient was discharged from the nursing home about 2 weeks ago and has been nonambulatory. There has been has been providing care with feeding and diaper change when needed. Patient does have decubitus ulcers. Patient denies any chest pain complains of back pain, no nausea vomiting or diarrhea. She has temp of 103.2, and was hypotensive. Patient started on empiric antibiotics including vancomycin, Azactam and Levaquin by the ED physician.  HPI Elements: Location:  depression. Quality:  wheel chair bound. Severity:  multiple medical problems'. Timing:  unknown stresses.  Past Psychiatric History: Past Medical  History  Diagnosis Date  . Hypertension   . Diabetes mellitus without complication   . Arthritis   . Coronary artery disease     pt unaware    reports that she has never smoked. She does not have any smokeless tobacco history on file. She reports that she does not drink alcohol or use illicit drugs. Family History  Problem Relation Age of Onset   . Hypertension Brother      Living Arrangements: Spouse/significant other   Abuse/Neglect [facility]) Physical Abuse: Denies Verbal Abuse: Denies Sexual Abuse: Denies Allergies:   Allergies  Allergen Reactions  . Penicillins Rash    ACT Assessment Complete:  NO Objective: Blood pressure 91/53, pulse 74, temperature 97.3 F (36.3 C), temperature source Oral, resp. rate 17, height 5' 4" (1.626 m), weight 87.6 kg (193 lb 2 oz), SpO2 96 %.Body mass index is 33.13 kg/(m^2). Results for orders placed or performed during the hospital encounter of 03/19/14 (from the past 72 hour(s))  Glucose, capillary     Status: Abnormal   Collection Time: 03/20/14 11:58 AM  Result Value Ref Range   Glucose-Capillary 154 (H) 70 - 99 mg/dL   Comment 1 Documented in Chart    Comment 2 Notify RN   Glucose, capillary     Status: Abnormal   Collection Time: 03/20/14  3:41 PM  Result Value Ref Range   Glucose-Capillary 148 (H) 70 - 99 mg/dL   Comment 1 Documented in Chart    Comment 2 Notify RN   Culture, blood (routine x 2)     Status: None (Preliminary result)   Collection Time: 03/20/14  5:30 PM  Result Value Ref Range   Specimen Description BLOOD RIGHT HAND    Special Requests BOTTLES DRAWN AEROBIC ONLY [place]    Culture  Setup Time      03/21/2014 01:05 Performed at [place] NO GROWTH TO DATE CULTURE WILL BE HELD FOR 5 DAYS BEFORE ISSUING A FINAL NEGATIVE REPORT Performed at [facility]    Report Status PENDING   Culture, blood (routine x 2)     Status: None (Preliminary result)   Collection Time: 03/20/14  5:37 PM  Result Value Ref Range   Specimen Description BLOOD LEFT ARM    Special Requests      BOTTLES DRAWN AEROBIC ONLY BLUE BOTTLE 9 CC, RED BOTTLE 1 CC   Culture  Setup Time      03/21/2014 01:06 Performed at [place] NO GROWTH TO DATE CULTURE WILL BE HELD FOR  5 DAYS BEFORE ISSUING A FINAL NEGATIVE REPORT Performed at [facility]    Report Status PENDING   Glucose, capillary     Status: Abnormal   Collection Time: 03/20/14  7:14 PM  Result Value Ref Range   Glucose-Capillary 170 (H) 70 - 99 mg/dL   Comment 1 Documented in Chart    Comment 2 Notify RN   Glucose, capillary     Status: Abnormal   Collection Time: 03/20/14 11:03 PM  Result Value Ref Range   Glucose-Capillary 180 (H) 70 - 99 mg/dL   Comment 1 Notify RN    Comment 2 Documented in Chart   Glucose, capillary     Status: Abnormal   Collection Time: 03/21/14  3:49 AM  Result Value Ref Range   Glucose-Capillary 201 (H) 70 - 99 mg/dL   Comment 1 Documented in Chart    Comment 2 Notify RN   Comprehensive metabolic panel     Status: Abnormal   Collection Time: 03/21/14  3:57 AM  Result Value Ref Range   Sodium 135 (L) 137 - 147 mEq/L   Potassium 3.7 3.7 - 5.3 mEq/L   Chloride 105 96 - 112 mEq/L   CO2 21 19 - 32 mEq/L   Glucose, Bld 218 (H) 70 - 99 mg/dL   BUN 15 6 - 23 mg/dL   Creatinine, Ser 0.25 (L) 0.50 - 1.10 mg/dL   Calcium 7.4 (L) 8.4 - 10.5 mg/dL   Total Protein 5.4 (L) 6.0 - 8.3 g/dL   Albumin 1.0 (L) 3.5 - 5.2 g/dL   AST 72 (H) 0 - 37 U/L   ALT 15 0 - 35 U/L   Alkaline Phosphatase 203 (H) 39 - 117 U/L   Total Bilirubin 1.0 0.3 - 1.2 mg/dL   GFR calc non Af Amer >90 >90 mL/min   GFR calc Af Amer >90 >90 mL/min    Comment: (NOTE) The eGFR has been calculated using the CKD EPI equation. This calculation has not been validated in all clinical situations. eGFR's persistently <90 mL/min signify possible Chronic Kidney Disease.    Anion gap 9 5 - 15  HIV antibody     Status: None   Collection Time: 03/21/14  3:57 AM  Result Value Ref Range   HIV 1&2 Ab, 4th Generation NONREACTIVE NONREACTIVE    Comment: (NOTE) A NONREACTIVE HIV Ag/Ab result does not exclude HIV infection since the time frame for seroconversion is variable. If acute HIV infection is  suspected, a HIV-1 RNA Qualitative TMA test is recommended. HIV-1/2 Antibody Diff         Not indicated. HIV-1 RNA, Qual TMA           Not indicated. PLEASE NOTE: This information has been disclosed to you from records whose confidentiality may be protected by state law. If your state requires such protection, then the state law prohibits you from making any further disclosure of the information without the specific written consent of the person to whom it pertains, or as otherwise permitted by law. A general authorization for the release of medical or other information is NOT sufficient for this purpose. The performance of this assay has not been clinically validated in patients less than 2 years old. Performed at [facility]   C-reactive protein     Status: Abnormal   Collection Time: 03/21/14  3:57 AM  Result Value Ref Range   CRP 16.1 (H) <0.60 mg/dL    Comment: Performed at [facility]  Hemoglobin A1c     Status: Abnormal   Collection Time: 03/21/14  3:57 AM  Result Value Ref Range   Hgb A1c MFr Bld 6.8 (H) <5.7 %    Comment: (NOTE)                                                                       According to the ADA Clinical Practice Recommendations for 2011, when HbA1c is used as a screening test:  >=6.5%     Diagnostic of Diabetes Mellitus           (if abnormal result is confirmed) 5.7-6.4%   Increased risk of developing Diabetes Mellitus References:Diagnosis and Classification of Diabetes Mellitus,Diabetes Care,2011,34(Suppl 1):S62-S69 and Standards of Medical Care in         Diabetes - 2011,Diabetes Care,2011,34 (Suppl 1):S11-S61.    Mean Plasma Glucose 148 (H) <117 mg/dL    Comment: Performed at [facility]  Sedimentation rate     Status: Abnormal   Collection Time: 03/21/14  3:57 AM  Result Value Ref Range   Sed Rate 90 (H) 0 - 22 mm/hr  Prealbumin     Status: Abnormal   Collection Time: 03/21/14  3:57 AM  Result Value Ref Range    Prealbumin <3.0 (L) 17.0 - 34.0 mg/dL    Comment: Result repeated and verified. Performed at [facility]   Hepatitis panel, acute     Status: None   Collection Time: 03/21/14  3:57 AM  Result Value Ref Range   Hepatitis B Surface Ag NEGATIVE NEGATIVE   HCV Ab NEGATIVE NEGATIVE   Hep A IgM NON REACTIVE NON REACTIVE    Comment: (NOTE) Effective March 02, 2014, Hepatitis Acute Panel (test code 22940) will be revised to automatically reflex to the Hepatitis C Viral RNA, Quantitative, Real-Time PCR assay if the Hepatitis C antibody screening result is Reactive. This action is being taken to ensure that the CDC/USPSTF recommended HCV diagnostic algorithm with the appropriate test reflex needed for accurate interpretation is followed.    Hep B C IgM NON REACTIVE NON REACTIVE    Comment: (NOTE) High levels of Hepatitis B Core IgM antibody are detectable during the acute stage of Hepatitis B. This antibody is used to differentiate current from past HBV infection. Performed at [facility]   CBC with Differential     Status: Abnormal   Collection Time: 03/21/14  3:57 AM  Result Value Ref Range   WBC 5.4 4.0 - 10.5 K/uL   RBC 3.19 (L) 3.87 - 5.11 MIL/uL   Hemoglobin 8.7 (L) 12.0 - 15.0 g/dL   HCT 26.9 (L) 36.0 - 46.0 %   MCV 84.3 78.0 - 100.0 fL   MCH 27.3 26.0 - 34.0 pg   MCHC 32.3 30.0 - 36.0 g/dL   RDW 16.9 (H) 11.5 - 15.5 %   Platelets 81 (L) 150 - 400 K/uL    Comment: REPEATED TO VERIFY SPECIMEN CHECKED FOR CLOTS CONSISTENT WITH PREVIOUS RESULT    Neutrophils Relative % 82 (H) 43 - 77 %   Neutro Abs 4.4 1.7 - 7.7 K/uL   Lymphocytes Relative 13 12 - 46 %   Lymphs Abs 0.7 0.7 - 4.0 K/uL   Monocytes Relative 5 3 - 12 %   Monocytes Absolute 0.3 0.1 - 1.0 K/uL   Eosinophils Relative 0 0 - 5 %   Eosinophils Absolute 0.0 0.0 - 0.7 K/uL   Basophils Relative 0 0 - 1 %   Basophils Absolute 0.0 0.0 - 0.1 K/uL  Glucose, capillary     Status: Abnormal    Collection Time: 03/21/14  8:08 AM  Result Value Ref Range   Glucose-Capillary 181 (H) 70 - 99 mg/dL   Comment 1 Documented in Chart    Comment 2 Notify RN   Protime-INR     Status: Abnormal   Collection Time: 03/21/14  9:54 AM  Result Value Ref Range   Prothrombin Time 15.3 (H) 11.6 - 15.2 seconds     INR 1.20 0.00 - 1.49  Glucose, capillary     Status: Abnormal   Collection Time: 03/21/14 12:20 PM  Result Value Ref Range   Glucose-Capillary 210 (H) 70 - 99 mg/dL   Comment 1 Documented in Chart    Comment 2 Notify RN   Heparin level (unfractionated)     Status: Abnormal   Collection Time: 03/21/14  2:06 PM  Result Value Ref Range   Heparin Unfractionated <0.10 (L) 0.30 - 0.70 IU/mL    Comment:        IF HEPARIN RESULTS ARE BELOW EXPECTED VALUES, AND PATIENT DOSAGE HAS BEEN CONFIRMED, SUGGEST FOLLOW UP TESTING OF ANTITHROMBIN III LEVELS. Performed at [facility]   Glucose, capillary     Status: Abnormal   Collection Time: 03/21/14  4:27 PM  Result Value Ref Range   Glucose-Capillary 235 (H) 70 - 99 mg/dL  Glucose, capillary     Status: Abnormal   Collection Time: 03/21/14 10:23 PM  Result Value Ref Range   Glucose-Capillary 258 (H) 70 - 99 mg/dL   Comment 1 Documented in Chart    Comment 2 Notify RN   BMET in AM     Status: Abnormal   Collection Time: 03/22/14  3:14 AM  Result Value Ref Range   Sodium 133 (L) 137 - 147 mEq/L   Potassium 4.0 3.7 - 5.3 mEq/L   Chloride 103 96 - 112 mEq/L   CO2 18 (L) 19 - 32 mEq/L   Glucose, Bld 276 (H) 70 - 99 mg/dL   BUN 24 (H) 6 - 23 mg/dL   Creatinine, Ser 0.48 (L) 0.50 - 1.10 mg/dL    Comment: DELTA CHECK NOTED REPEATED TO VERIFY    Calcium 7.4 (L) 8.4 - 10.5 mg/dL   GFR calc non Af Amer >90 >90 mL/min   GFR calc Af Amer >90 >90 mL/min    Comment: (NOTE) The eGFR has been calculated using the CKD EPI equation. This calculation has not been validated in all clinical situations. eGFR's persistently <90 mL/min signify  possible Chronic Kidney Disease.    Anion gap 12 5 - 15  CBC     Status: Abnormal   Collection Time: 03/22/14  3:14 AM  Result Value Ref Range   WBC 6.7 4.0 - 10.5 K/uL   RBC 3.58 (L) 3.87 - 5.11 MIL/uL   Hemoglobin 9.7 (L) 12.0 - 15.0 g/dL   HCT 30.2 (L) 36.0 - 46.0 %   MCV 84.4 78.0 - 100.0 fL   MCH 27.1 26.0 - 34.0 pg   MCHC 32.1 30.0 - 36.0 g/dL   RDW 16.7 (H) 11.5 - 15.5 %   Platelets 113 (L) 150 - 400 K/uL    Comment: DELTA CHECK NOTED REPEATED TO VERIFY   Magnesium     Status: None   Collection Time: 03/22/14  3:14 AM  Result Value Ref Range   Magnesium 1.8 1.5 - 2.5 mg/dL  Phosphorus     Status: Abnormal   Collection Time: 03/22/14  3:14 AM  Result Value Ref Range   Phosphorus 1.9 (L) 2.3 - 4.6 mg/dL  Heparin level (unfractionated)     Status: Abnormal   Collection Time: 03/22/14  3:14 AM  Result Value Ref Range   Heparin Unfractionated <0.10 (L) 0.30 - 0.70 IU/mL    Comment:        IF HEPARIN RESULTS ARE BELOW EXPECTED VALUES, AND PATIENT DOSAGE HAS BEEN CONFIRMED, SUGGEST FOLLOW UP TESTING OF ANTITHROMBIN III LEVELS. Performed   at [facility]   Glucose, capillary     Status: Abnormal   Collection Time: 03/22/14  7:43 AM  Result Value Ref Range   Glucose-Capillary 250 (H) 70 - 99 mg/dL   Comment 1 Documented in Chart    Comment 2 Notify RN   Vancomycin, trough     Status: Abnormal   Collection Time: 03/22/14 12:17 PM  Result Value Ref Range   Vancomycin Tr 32.5 (HH) 10.0 - 20.0 ug/mL    Comment: CRITICAL RESULT CALLED TO, READ BACK BY AND VERIFIED WITH: S DILLON AT 1357 ON 12.06.2015 BY NBROOKS   Heparin level (unfractionated)     Status: None   Collection Time: 03/22/14 12:17 PM  Result Value Ref Range   Heparin Unfractionated 0.47 0.30 - 0.70 IU/mL    Comment:        IF HEPARIN RESULTS ARE BELOW EXPECTED VALUES, AND PATIENT DOSAGE HAS BEEN CONFIRMED, SUGGEST FOLLOW UP TESTING OF ANTITHROMBIN III LEVELS. Performed at [facility]    Heparin level (unfractionated)     Status: None   Collection Time: 03/22/14  7:43 PM  Result Value Ref Range   Heparin Unfractionated 0.51 0.30 - 0.70 IU/mL    Comment:        IF HEPARIN RESULTS ARE BELOW EXPECTED VALUES, AND PATIENT DOSAGE HAS BEEN CONFIRMED, SUGGEST FOLLOW UP TESTING OF ANTITHROMBIN III LEVELS. Performed at [facility]   CBC     Status: Abnormal   Collection Time: 03/23/14  3:50 AM  Result Value Ref Range   WBC 7.7 4.0 - 10.5 K/uL   RBC 3.62 (L) 3.87 - 5.11 MIL/uL   Hemoglobin 9.9 (L) 12.0 - 15.0 g/dL   HCT 30.7 (L) 36.0 - 46.0 %   MCV 84.8 78.0 - 100.0 fL   MCH 27.3 26.0 - 34.0 pg   MCHC 32.2 30.0 - 36.0 g/dL   RDW 16.7 (H) 11.5 - 15.5 %   Platelets 108 (L) 150 - 400 K/uL    Comment: CONSISTENT WITH PREVIOUS RESULT  Heparin level (unfractionated)     Status: Abnormal   Collection Time: 03/23/14  3:50 AM  Result Value Ref Range   Heparin Unfractionated 0.75 (H) 0.30 - 0.70 IU/mL    Comment:        IF HEPARIN RESULTS ARE BELOW EXPECTED VALUES, AND PATIENT DOSAGE HAS BEEN CONFIRMED, SUGGEST FOLLOW UP TESTING OF ANTITHROMBIN III LEVELS. Performed at [facility]   Basic metabolic panel     Status: Abnormal   Collection Time: 03/23/14  3:50 AM  Result Value Ref Range   Sodium 132 (L) 137 - 147 mEq/L   Potassium 3.9 3.7 - 5.3 mEq/L   Chloride 101 96 - 112 mEq/L   CO2 16 (L) 19 - 32 mEq/L   Glucose, Bld 239 (H) 70 - 99 mg/dL   BUN 30 (H) 6 - 23 mg/dL   Creatinine, Ser 0.69 0.50 - 1.10 mg/dL   Calcium 7.6 (L) 8.4 - 10.5 mg/dL   GFR calc non Af Amer >90 >90 mL/min   GFR calc Af Amer >90 >90 mL/min    Comment: (NOTE) The eGFR has been calculated using the CKD EPI equation. This calculation has not been validated in all clinical situations. eGFR's persistently <90 mL/min signify possible Chronic Kidney Disease.    Anion gap 15 5 - 15  Magnesium     Status: None   Collection Time: 03/23/14  3:50 AM  Result Value Ref Range      Magnesium 1.9 1.5 - 2.5 mg/dL  Phosphorus     Status: None   Collection Time: 03/23/14  3:50 AM  Result Value Ref Range   Phosphorus 2.7 2.3 - 4.6 mg/dL  Glucose, capillary     Status: Abnormal   Collection Time: 03/23/14  7:46 AM  Result Value Ref Range   Glucose-Capillary 232 (H) 70 - 99 mg/dL   Comment 1 Documented in Chart    Comment 2 Notify RN    Labs are reviewed.  Current Facility-Administered Medications  Medication Dose Route Frequency Provider Last Rate Last Dose  . 0.9 %  sodium chloride infusion  250 mL Intravenous PRN Gagan S Lama, MD 10 mL/hr at 03/21/14 1900 250 mL at 03/21/14 1900  . acetaminophen (TYLENOL) tablet 650 mg  650 mg Oral Q6H PRN Gagan S Lama, MD   650 mg at 03/22/14 2207   Or  . acetaminophen (TYLENOL) suppository 650 mg  650 mg Rectal Q6H PRN Gagan S Lama, MD      . antiseptic oral rinse (CPC / CETYLPYRIDINIUM CHLORIDE 0.05%) solution 7 mL  7 mL Mouth Rinse q12n4p Anand D Hongalgi, MD   7 mL at 03/22/14 1600  . aspirin chewable tablet 81 mg  81 mg Oral Daily Kenneth C. Hilty, MD   81 mg at 03/23/14 0931  . atorvastatin (LIPITOR) tablet 20 mg  20 mg Oral q1800 Katarina H Nelson, MD   20 mg at 03/22/14 1827  . ceFAZolin (ANCEF) IVPB 2 g/50 mL premix  2 g Intravenous Q8H Jessica Mack, RPH   2 g at 03/23/14 0409  . chlorhexidine (PERIDEX) 0.12 % solution 15 mL  15 mL Mouth Rinse BID Anand D Hongalgi, MD   15 mL at 03/23/14 0900  . chlorpheniramine-HYDROcodone (TUSSIONEX) 10-8 MG/5ML suspension 5 mL  5 mL Oral Q12H PRN Steven Newton, MD   5 mL at 03/22/14 0133  . collagenase (SANTYL) ointment   Topical Daily Anand D Hongalgi, MD      . feeding supplement (GLUCERNA SHAKE) (GLUCERNA SHAKE) liquid 237 mL  237 mL Oral TID BM Sarah Frances Beaty, RD   237 mL at 03/23/14 1000  . fludrocortisone (FLORINEF) tablet 0.1 mg  0.1 mg Oral Daily Daniel J Feinstein, MD   0.1 mg at 03/23/14 0931  . heparin ADULT infusion 100 units/mL (25000 units/250 mL)  1,200 Units/hr  Intravenous Continuous Justin Marshall Legge, RPH 12 mL/hr at 03/23/14 0932 1,200 Units/hr at 03/23/14 0932  . hydrocortisone sodium succinate (SOLU-CORTEF) 100 MG injection 50 mg  50 mg Intravenous Q12H Rakesh Alva V, MD      . insulin aspart (novoLOG) injection 0-5 Units  0-5 Units Subcutaneous QHS Anand D Hongalgi, MD   3 Units at 03/22/14 2206  . insulin aspart (novoLOG) injection 0-9 Units  0-9 Units Subcutaneous TID WC Anand D Hongalgi, MD   3 Units at 03/23/14 0900  . insulin glargine (LANTUS) injection 15 Units  15 Units Subcutaneous Daily Rakesh Alva V, MD   15 Units at 03/23/14 0955  . mirtazapine (REMERON SOL-TAB) disintegrating tablet 30 mg  30 mg Oral QHS Deborah Ross, MD   30 mg at 03/22/14 2223  . ondansetron (ZOFRAN) tablet 4 mg  4 mg Oral Q6H PRN Gagan S Lama, MD   4 mg at 03/21/14 1352   Or  . ondansetron (ZOFRAN) injection 4 mg  4 mg Intravenous Q6H PRN Gagan S Lama, MD      . phenylephrine (NEO-SYNEPHRINE) 10 mg in   dextrose 5 % 250 mL (0.04 mg/mL) infusion  30-200 mcg/min Intravenous Titrated Daniel J Feinstein, MD   Stopped at 03/23/14 0800  . sodium chloride 0.9 % injection 3 mL  3 mL Intravenous Q12H Gagan S Lama, MD   3 mL at 03/22/14 0940  . sodium chloride 0.9 % injection 3 mL  3 mL Intravenous PRN Gagan S Lama, MD        Psychiatric Specialty Exam: Physical Exam as per history and physical  ROS decrease appetite   Blood pressure 91/53, pulse 74, temperature 97.3 F (36.3 C), temperature source Oral, resp. rate 17, height 5' 4" (1.626 m), weight 87.6 kg (193 lb 2 oz), SpO2 96 %.Body mass index is 33.13 kg/(m^2).  General Appearance: Guarded  Eye Contact::  Good  Speech:  Clear and Coherent and Slow  Volume:  Decreased  Mood:  Anxious and Depressed  Affect:  Constricted and Depressed  Thought Process:  Coherent and Goal Directed  Orientation:  Full (Time, Place, and Person)  Thought Content:  WDL  Suicidal Thoughts:  No  Homicidal Thoughts:  No  Memory:   Immediate;   Fair Recent;   Fair  Judgement:  Intact  Insight:  Fair  Psychomotor Activity:  Decreased  Concentration:  Fair  Recall:  Fair  Fund of Knowledge:Good  Language: Good  Akathisia:  NA  Handed:  Right  AIMS (if indicated):     Assets:  Communication Skills Desire for Improvement Financial Resources/Insurance Housing Intimacy Leisure Time Resilience Social Support  Sleep:      Musculoskeletal: Strength & Muscle Tone: decreased Gait & Station: unable to stand Patient leans: N/A  Treatment Plan Summary: Daily contact with patient to assess and evaluate symptoms and progress in treatment Medication management  Patient has capacity to make her own medical decisions and living arrangement Discontinue IVC and sitter as she has no safety concerns at this time Decrease Remeron 15 mg PO Qhs and add Zyprexa 2.5 mg PO Qhs for better appetite  ,JANARDHAHA R. 03/23/2014 10:07 AM 

## 2014-03-23 NOTE — Progress Notes (Signed)
Responded to spiritual care consult. Sister Darl Pikes was at bedside. Patient was awake. She asked her sister to leave [place] alone and then she asked for prayer. Prayed according to her wishes. Also offered support to sister, explained chaplain services available while the patient is in the hospital and gave her my contact information. Listened empathetically.  Donnelly Stager, PhD, [facility] Chaplain

## 2014-03-23 NOTE — Evaluation (Signed)
Physical Therapy Evaluation Patient Details Name: Robin Schroeder MRN: [id number] DOB: Sep 19, 1955 Today's Date: 03/23/2014   History of Present Illness  pt. admitted As per patient's husband, patient did not want to eat or drink and she is not eating and drinking for past 5 days and so he got concerned that she wants to hurt herself. So he filled out IVC and patient was brought by the EMS. Patient was discharged from the nursing home about 2 weeks ago and has been nonambulatory. There has been has been providing care with feeding and diaper change when needed. Patient does have decubitus ulcers  Clinical Impression  Patient did not  Want to participate in  Evaluation, per chart, patient had been in rehab and  Nonambulatory. Patient's sister  entered room and stated that if patient was in pain , then PT should be stopped. Left patient in partial chir position of bed, sitter  At bedside  And will return  Bed to bed position. Potential for  Functional recovery guarded due to patient's c recent rehab efforts and  Patient not desiring to participate, pain of extremities. Will  Provide trial of PT at this time.    Follow Up Recommendations SNF;Supervision/Assistance - 24 hour    Equipment Recommendations  None recommended by PT    Recommendations for Other Services       Precautions / Restrictions Precautions Precaution Comments: nonambulatory      Mobility  Bed Mobility Overal bed mobility: Needs Assistance             General bed mobility comments: patient would not  allow  attempts for rolling, sitting, although Chair position feature utilized for a partial  sitting upright, passisvely. patient yelling out"I wouldn't do that"  Transfers                    Ambulation/Gait                Stairs            Wheelchair Mobility    Modified Rankin (Stroke Patients Only)       Balance                                              Pertinent Vitals/Pain Pain Assessment: Faces Faces Pain Scale: Hurts worst Pain Location: with   barely touching  L leg, arms,    Home Living                   Additional Comments: bedbound,  at [facility]   in recent past, non ambulatory,     Prior Function                 Hand Dominance        Extremity/Trunk Assessment   Upper Extremity Assessment: RUE deficits/detail;LUE deficits/detail RUE Deficits / Details: pt would not allow testing     LUE Deficits / Details: same as R   Lower Extremity Assessment: RLE deficits/detail;LLE deficits/detail RLE Deficits / Details: would not participate for testing LLE Deficits / Details: pt would not allow leg to be moved. Leg in internal rotation, PRAFO in place     Communication      Cognition Arousal/Alertness: Awake/alert   Overall Cognitive Status: Difficult to assess (pt could not really provide history)  General Comments      Exercises        Assessment/Plan    PT Assessment Patient needs continued PT services  PT Diagnosis Acute pain   PT Problem List Decreased range of motion;Decreased activity tolerance;Decreased mobility  PT Treatment Interventions Functional mobility training   PT Goals (Current goals can be found in the Care Plan section) Acute Rehab PT Goals 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: patient  did not want  to participate PT Goal Formulation: Patient unable to participate in goal setting Time For Goal Achievement: 04/06/14 Potential to Achieve Goals: Poor    Frequency Min 2X/week (TRIAL, for patient to participate.)   Barriers to discharge   patient did not participate in  mobility , per chart , non ambulatory PTA,    Co-evaluation               End of Session   Activity Tolerance: Patient limited by pain Patient left: in bed;with call bell/phone within reach;with family/visitor present Nurse Communication: Mobility status         Time:  1139-1150 PT Time Calculation (min) (ACUTE ONLY): 11 min   Charges:   PT Evaluation $Initial PT Evaluation Tier I: 1 Procedure PT Treatments $Therapeutic Activity: 8-22 mins   PT G Codes:          Rada Hay 03/23/2014, 1:58 PM  Blanchard Kelch PT [phone]

## 2014-03-23 NOTE — Progress Notes (Signed)
Family upset about patient not receiving pain medication and only having tylenol ordered for pain. Family stated that patient has decubitus to bone and their mother was on a morphine drip and died from that. Nurse tried to explain that patient has not complained of pain today and states she only hurts when turned. Patient denying pain at this time. 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 Will continue to monitor.

## 2014-03-23 NOTE — Progress Notes (Signed)
Inpatient Diabetes Program Recommendations  AACE/ADA: New Consensus Statement on Inpatient Glycemic Control (2013)  Target Ranges:  Prepandial:   less than 140 mg/dL      Peak postprandial:   less than 180 mg/dL (1-2 hours)      Critically ill patients:  140 - 180 mg/dL     Results for Robin Schroeder, Robin Schroeder (MRN [id number]) as of 03/23/2014 08:04  Ref. Range 03/21/2014 08:08 03/21/2014 12:20 03/21/2014 16:27 03/21/2014 22:23  Glucose-Capillary Latest Range: 70-99 mg/dL 564 (H) 332 (H) 951 (H) 258 (H)    Results for Robin Schroeder, Robin Schroeder (MRN [id number]) as of 03/23/2014 08:04  Ref. Range 03/22/2014 07:43  Glucose-Capillary Latest Range: 70-99 mg/dL 016 (H)     Admitted with Fever/ Sepsis.  History of DM, HTN, CAD.   Home DM Meds: None   Current Insulin Orders: Lantus 10 units daily in the AM      Novolog Sensitive SSI tid ac + HS   **Patient currently receiving IV Solucortef 50 mg Q6 hours  **Having elevated fasting and postprandial glucose levels  **A1c 6.8%- Not sure about the accuracy of this A1c given patient's low Hgb on admit and also based on the fact that she received 1 unit PRBCs here in the hospital after 2nd A1c level drawn    MD- Please consider the following in-hospital insulin adjustments while patient is receiving IV steroids:  1. Increase Lantus to 15 units QAM (~0.2 units/kg dosing) 2. Add Novolog Meal Coverage- Novolog 4 units tid with meals    Will follow Ambrose Finland RN, MSN, CDE Diabetes Coordinator Inpatient Diabetes Program Team Pager: [phone] (8a-10p)

## 2014-03-23 NOTE — Progress Notes (Signed)
[facility]    Date of Admission:  03/19/2014   Total days of antibiotics 5        Day 3 cefazolin           ID: Robin Schroeder is a 58 y.o. female with recurrent MSSA bacteremia c/b STEMI, AKI  Active Problems:   DM2 (diabetes mellitus, type 2)   Physical deconditioning   Hypoalbuminemia   Hyponatremia   Severe sepsis   Acute UTI   Protein-calorie malnutrition, severe   Sacral decubitus ulcer   Acute pulmonary edema   Effusion into joint   ST elevation myocardial infarction (STEMI) of inferolateral wall, initial episode of care   Severe sepsis with septic shock   Diabetic ulcer of left foot associated with diabetes mellitus due to underlying condition   Osteomyelitis   Ulcer of heel   Acute osteomyelitis of femur   Septic arthritis of knee, left   STEMI (ST elevation myocardial infarction)   Ulcer of left heel   Metabolic acidosis    Subjective: Afebrile, WBC still elevated from her baseline. Still diffusely tender to all joints. Finished 48hr of heparin, now on asa. Her sisters from [place] and one from [place] are at her bedside. [place] not seen her since 2012.   Medications:  . antiseptic oral rinse  7 mL Mouth Rinse q12n4p  . aspirin  81 mg Oral Daily  . atorvastatin  20 mg Oral q1800  .  ceFAZolin (ANCEF) IV  2 g Intravenous Q8H  . chlorhexidine  15 mL Mouth Rinse BID  . collagenase   Topical Daily  . enoxaparin (LOVENOX) injection  40 mg Subcutaneous Q24H  . feeding supplement (GLUCERNA SHAKE)  237 mL Oral TID BM  . fludrocortisone  0.1 mg Oral Daily  . hydrocortisone sod succinate (SOLU-CORTEF) inj  50 mg Intravenous Q12H  . insulin aspart  0-5 Units Subcutaneous QHS  . insulin aspart  0-9 Units Subcutaneous TID WC  . insulin glargine  15 Units Subcutaneous Daily  . mirtazapine  30 mg Oral QHS  . sodium bicarbonate  650 mg Oral TID  . sodium chloride  3 mL Intravenous Q12H    Objective: Vital  signs in last 24 hours: Temp:  [97.3 F (36.3 C)-97.8 F (36.6 C)] 97.6 F (36.4 C) (12/07 1200) Pulse Rate:  [46-107] 96 (12/07 1430) Resp:  [16-24] 18 (12/07 1430) BP: (74-130)/(46-74) 97/46 mmHg (12/07 1430) SpO2:  [94 %-100 %] 94 % (12/07 1430) Weight:  [193 lb 2 oz (87.6 kg)] 193 lb 2 oz (87.6 kg) (12/07 0500) Physical Exam  Constitutional:  oriented to person, place, and time. appears anasarcic, disheveled. In mild distress from pain with movement HENT:  Mouth/Throat: Oropharynx is clear and moist. No oropharyngeal exudate. Poor dentition Cardiovascular: Normal rate, regular rhythm and normal heart sounds. Exam reveals no gallop and no friction rub.  No murmur heard.  Pulmonary/Chest: Effort normal and breath sounds normal. No respiratory distress.  has no wheezes.  Abdominal: Soft. Bowel sounds are normal.  exhibits no distension. There is no tenderness.  Lymphadenopathy: no cervical adenopathy.  Neurological: alert and oriented to person, place, and time.  Skin: heel ulcer to medial aspect of left leg. Ext: diffuse anasarca to L> R arm Psychiatric: flat affect  Lab Results  Recent Labs  03/22/14 0314 03/23/14 0350  WBC 6.7 7.7  HGB 9.7* 9.9*  HCT 30.2* 30.7*  NA 133* 132*  K 4.0  3.9  CL 103 101  CO2 18* 16*  BUN 24* 30*  CREATININE 0.48* 0.69   Liver Panel  Recent Labs  03/21/14 0357  PROT 5.4*  ALBUMIN 1.0*  AST 72*  ALT 15  ALKPHOS 203*  BILITOT 1.0   Sedimentation Rate  Recent Labs  03/21/14 0357  ESRSEDRATE 90*   C-Reactive Protein  Recent Labs  03/21/14 0357  CRP 16.1*    Microbiology: 12/3 blood cx x 2 MSSA 12/4 blood cx NGTD  Studies/Results: Dg Chest Port 1 View  03/23/2014   CLINICAL DATA:  Pulmonary edema. Hypertension. Diabetes. Coronary artery disease.  EXAM: PORTABLE CHEST - 1 VIEW  COMPARISON:  03/21/2014  FINDINGS: Mild worsening of infiltrate seen in the left retrocardiac lung base. Mild opacity also seen in the medial  right lung base is suspicious for developing infiltrate. Tiny left pleural effusion cannot be excluded Heart size remains within normal limits.  IMPRESSION: Increased infiltrate in the left retrocardiac lung base, and probably the medial right lung base as well.   Electronically Signed   By: Myles Rosenthal M.D.   On: 03/23/2014 07:09    Assessment/Plan: Recurrent MSSA bacteremia but also has numerous pressure ulcers, unable to mobilize due to deconditioning and polyarthritis from untreated rheumatoid arthritis,(newly diagnosed in Oct 2015)  MSSA bacteremia = still need to determine source of infection. Will need TEE when stable. Would get MRI of left foot and ankle and mri of plevis to see if septic arthritis or osteomyelitis that would explain MSSA bacteremia recurrence. For now keep on renally dosed cefazolin. Will likely place her on continued IV antibiotics for extended period of time. Knee effusion may also be source of infection  i think she will need conscious sedation for imaging to occur. She has TOO much pain with very limited touch. I can't imagine that she would tolerate imaging for any length of time without pain and anxiety addressed  Anasarca = from protein caloric malnutrition. May consider doing ultrasound to right arm to ensure no dvt  Pressure ulcer = continue with local wound care  Newly diagnosed Rheumatoid Arthritis = it does not appear that they followed thru with getting PCP/rheum referral. I suspect her immobility is in part due other polyarthritis from RA. Would recommend that she gets rheum input while she is hospitalized since she was not able to establish care on her own. I am concern that she nor her husband are able to navigate the health care system on their own.  STEMI = in setting of sepsis from MSSA. Will defer to cardiology for management  dispo = to difficult to tell, but suspect she will need SNF. She will need case management to help start medicaid application  since unclear if she has coverage. Case management also needed to help family and patient understand necessary steps. The patient and her husband have low health literacy and understanding how to acquire health insurance, since we had asked him to start process in early November.  Drue Second [facility] Cell: [phone] Pager: [phone]  03/23/2014, 3:00 PM

## 2014-03-23 NOTE — Progress Notes (Signed)
Clinical Social Work Department CLINICAL SOCIAL WORK PSYCHIATRY SERVICE LINE ASSESSMENT 03/23/2014  Patient:  Robin Schroeder  Account:  [id number]  Admit Date:  03/19/2014  Clinical Social Worker:  Unk Lightning, LCSW  Date/Time:  03/23/2014 02:45 PM Referred by:  Physician  Date referred:  03/23/2014 Reason for Referral  Psychosocial assessment   Presenting Symptoms/Problems (In the person's/family's own words):   Psych consulted due to [facility]   Abuse/Neglect/Trauma History (check all that apply)  Denies history   Abuse/Neglect/Trauma Comments:   Psychiatric History (check all that apply)  Outpatient treatment   Psychiatric medications:  Remeron 30 mg   Current Mental Health Hospitalizations/Previous Mental Health History:   Patient reports that she is taking an antidepressant because she has been feeling depressed. Patient reports that she is compliant with her medications.   Current provider:   PCP   Place and Date:   [place], [place]   Current Medications:   Scheduled Meds:      . antiseptic oral rinse  7 mL Mouth Rinse q12n4p  . aspirin  81 mg Oral Daily  . atorvastatin  20 mg Oral q1800  .  ceFAZolin (ANCEF) IV  2 g Intravenous Q8H  . chlorhexidine  15 mL Mouth Rinse BID  . collagenase   Topical Daily  . enoxaparin (LOVENOX) injection  40 mg Subcutaneous Q24H  . feeding supplement (GLUCERNA SHAKE)  237 mL Oral TID BM  . fludrocortisone  0.1 mg Oral Daily  . hydrocortisone sod succinate (SOLU-CORTEF) inj  50 mg Intravenous Q12H  . insulin aspart  0-5 Units Subcutaneous QHS  . insulin aspart  0-9 Units Subcutaneous TID WC  . insulin glargine  15 Units Subcutaneous Daily  . mirtazapine  30 mg Oral QHS  . sodium bicarbonate  650 mg Oral TID  . sodium chloride  3 mL Intravenous Q12H        Continuous Infusions:      . phenylephrine (NEO-SYNEPHRINE) Adult infusion Stopped (03/23/14 0800)          PRN Meds:.sodium chloride, acetaminophen **OR** acetaminophen,  chlorpheniramine-HYDROcodone, ondansetron **OR** ondansetron (ZOFRAN) IV, sodium chloride       Previous Impatient Admission/Date/Reason:   None reported   Emotional Health / Current Symptoms    Suicide/Self Harm  Suicidal ideation (ex: "I can't take any more,I wish I could disappear")   Suicide attempt in the past:   RN reports that patient has been making statements about not wanting to live. Patient denies any SI or HI. Patient reports that she would never harm herself and no attempts in the past.   Other harmful behavior:   None reported   Psychotic/Dissociative Symptoms  None reported   Other Psychotic/Dissociative Symptoms:    Attention/Behavioral Symptoms  Withdrawn   Other Attention / Behavioral Symptoms:   Patient has flat affect throughout assessment.    Cognitive Impairment  Within Normal Limits   Other Cognitive Impairment:   Patient alert and oriented.    Mood and Adjustment  DEPRESSION    Stress, Anxiety, Trauma, Any Recent Loss/Stressor  None reported   Anxiety (frequency):   N/A   Phobia (specify):   N/A   Compulsive behavior (specify):   N/A   Obsessive behavior (specify):   N/A   Other:   N/A   Substance Abuse/Use  None   SBIRT completed (please refer for detailed history):  N  Self-reported substance use:   Patient denies any substance use.   Urinary Drug Screen Completed:  N Alcohol  level:   N/A    Environmental/Housing/Living Arrangement  Stable housing   Who is in the home:   Husband   Emergency contact:  Rosario-husband   Financial  IPRS   Patient's Strengths and Goals (patient's own words):   Patient reports supportive family.   Clinical Social Worker's Interpretive Summary:   CSW received referral in order to complete psychosocial assessment. CSW reviewed chart, spoke with bedside RN, and rounded with patient with psych MD.    Patient laying in bed and has three sisters who were visiting. Patient reports  she lives at home with her husband but was recently at [facility]. Patient reports that she was not doing well at home and "just wants to get straightened out so she can go back home." Patient reports that she was not eating well at home because she does not have an appetite. Patient reports that she does not want to be a burden to her family and wants to get better so she can ambulate and take care of herself.    Patient reports a long history of depression and reports she is usually compliant with medication. Patient was IVCed by husband on admission but after speaking with psych MD and evaluating patient, psych MD reports IVC does not need to be renewed and patient is not a danger to herself or others. CSW shared this information with bedside RN.    CSW will continue to follow to assist as needed.   Disposition:  Recommend Psych CSW continuing to support while in hospital   [place], [place] [phone]

## 2014-03-23 NOTE — Plan of Care (Signed)
Problem: Phase I Progression Outcomes Goal: OOB as tolerated unless otherwise ordered Outcome: Progressing PT/OT consult Goal: Initial discharge plan identified Outcome: Progressing Goal: Voiding-avoid urinary catheter unless indicated Outcome: Completed/Met Date Met:  03/23/14 Goal: Hemodynamically stable Outcome: Progressing NEO currently off Goal: Other Phase I Outcomes/Goals Outcome: Completed/Met Date Met:  03/23/14

## 2014-03-23 NOTE — Progress Notes (Addendum)
PROGRESS NOTE    FRANCI OSHANA UUV:[id number] DOB: 1955/07/26 DOA: 03/19/2014 PCP: No PCP Per Patient  HPI/Brief narrative 58 year old female patient with history of hypertension, obesity, DM, chronic diastolic CHF, possible rheumatoid arthritis, recent hospitalization for MSSA bacteremia of unknown source/neg TEE- cleared after Vancomycin (PCN allergy), went to SNF and then discharged from their to home, presented with complaints of not eating or drinking 4 days and husband was concerned that patient was trying to hurt herself and she was IVC and brought to ED where she was assessed as sepsis of unclear source and admitted to stepdown for further management. Patient went into septic shock requiring pressors. She also developed STEMI but poor overall candidate for aggressive intervention i.e. Currently. CCM, ID and cardiology assisting with care. Shock resolved- pressors DC'ed 12/7 AM.   Assessment/Plan:  1. Septic shock secondary to MSSA bacteremia: Patient had been empirically started on IV vancomycin and aztreonam. Source not clear-? Osteomyelitis of medial left knee on x-ray. Infectious disease follow-up appreciated. Antibiotics narrowed to IV Ancef. Shock improved/resolved and DC'd pressors 12/7 AM. Continue stress dose IV hydrocortisone (tapering dose) and Fludrocortisone. As per ID, will eventually need repeat TEE when medically stable. Obtain MRI of left knee. Surveillance blood cultures 2 (12/4): Negative to date. 2. ? Osteomyelitis of medial left knee on x-ray: ID follow-up appreciated. MRI of left knee when stable. Orthopedic consultation pending MRI results. 3. Left lower lobe airspace disease: Possibly chronic.? Pneumonia. ID and pulmonary to weigh in the need for broadening antibiotic coverage. In the absence of respiratory symptoms, felt less likely.  4. Dehydration with hyponatremia: Resolved.  5. Inferiolateral wall STEMI: Patient has not complained of chest pain since  admission. Troponins steadily increased to >20 on 12/3. Not candidate for aggressive intervention i.e. cath secondary to unstable status from shock, anemia and thrombocytopenia. Patient treated with 48 hours of IV heparin-DC'd 12/7 and aspirin-tolerating. Will eventually need cardiac catheterization when stable. Follow-up 2-D echo-cardiologists unable to read secondary to systems being down for the last few days. Cardiology follow-up appreciated. Continue statins. Consider adding beta blockers if blood pressures consistently stable.  6. IVC/?? Intention to self harm: Patient however repeatedly denies. 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 Psychiatry consultation appreciated. Discussed with Dr. Tenny Craw 12/5. As per nursing report 12/7, while turning patient-she expressed that she wanted to die and nursing concerned regarding suicidal ideations although patient repeatedly denies. Have requested psychiatry to reassess regarding suicidal ideation and need for continued IVC. For now continue 1:1 sitter. 7. Anasarca: Secondary to ongoing illnesses, malnutrition and hypo-albuminemia. Treat underlying cause and attempt to improve nutritional status. Diuretics to be started if blood pressures remain consistently stable. 8. Right sacral decubitus: Does not appear grossly infected. Wound care consultation requested. 9. Hypertension: Patient in septic shock. Management as above. 10. Type II DM: Uncontrolled. Likely worsened by steroids. Added low-dose Lantus-we'll titrate up and continue SSI. 11. Possible rheumatoid arthritis: Patient was on prednisone at home. Currently on stress dose IV hydrocortisone-tapering down and eventually start back prednisone. RA related pain was probably the reason she had been bed bound since recent DC from SNF. Recommend an OP Rheumatology consultation appointment be set up for her prior to DC- this can be done when she is stable and  approaching DC. 12. Chronic diastolic CHF: Seems compensated. 13. Hypokalemia: Replaced. 14. Anemia: Acute on chronic Unclear etiology. Baseline hemoglobin probably in the 9 g per DL  range. Status post 1 unit PRBC 12/4 with appropriate response. Hemoglobin stable/improving posttransfusion. 15. Thrombocytopenia: Possibly from acute illness/sepsis. Continues to improve. Follow CBCs. 16. Multiple pressure wounds: Details as per WOC nurse note on 12/4. Patient has left heel DTI, right arm full-thickness abrasion and unstageable right buttock wound. 17. Failure to thrive: Multifactorial 18. Severe deconditioning: PT and OT evaluation unable. 19. Severe protein calorie malnutrition: Dietitian consulted. 20. Depressive disorder secondary to general medical condition: Management per psychiatry. Remeron added. Requested psychiatry to reassess.  21. Oliguria: Likely secondary to shock. Patient did diurese with a dose of IV Lasix on 11/6 night. Consider Lasix if blood pressure remains stable. Creatinine normal. 22. E Coli UTI: Continue Ancef- sensitive. 23. Mild non-anion gap metabolic acidosis: Unclear etiology. Start by mouth bicarbonate. Follow BMP in a.m.    Code Status: Limited code Family Communication: discussed with spouse in detail on 03/23/14. Disposition Plan: Remains in ICU. Not medically stable for discharge.   Consultants:  Cardiology  ID  Psychiatry  PCCM-Signed off  Procedures:  Arterial line-discontinued  Foley catheter  Antibiotics:  IV Vanc 12/3>DC'd   IV Aztreonam 12/3> 12/4   IV Ancef.  Subjective: Denies complaints. Denies suicidal ideations-however nurses concerned regarding patient's statements. Complains of pain in both legs but no specific location. Denies dyspnea or chest pain.    Objective: Filed Vitals:   03/23/14 0930 03/23/14 1000 03/23/14 1030 03/23/14 1100  BP: 91/53 77/59 85/57  74/59  Pulse: 74 73 79 90  Temp:      TempSrc:      Resp: 17  17 17 18   Height:      Weight:      SpO2: 96% 97% 97% 95%   blood pressures actually better than listed bowel-as discussed with nursing systolic in the 100s and map 73   Intake/Output Summary (Last 24 hours) at 03/23/14 1156 Last data filed at 03/23/14 1000  Gross per 24 hour  Intake 1812.33 ml  Output    545 ml  Net 1267.33 ml   Filed Weights   03/19/14 1550 03/22/14 0437 03/23/14 0500  Weight: 97.523 kg (215 lb) 88.1 kg (194 lb 3.6 oz) 87.6 kg (193 lb 2 oz)     Exam:  General exam: Pleasant middle-aged female, moderately built and obese lying comfortably supine in bed. Respiratory system: Slightly diminished breath sounds in the bases but otherwise clear to auscultation. No increased work of breathing. Cardiovascular system: S1 & S2 heard, RRR. No JVD, murmurs, gallops, clicks. Anasarca. Telemetry: SB in the 50s-SR. Gastrointestinal system: Abdomen is nondistended, soft and nontender. Normal bowel sounds heard. Patient has patchy ecchymosis over right upper quadrant. Central nervous system: Alert and oriented 3. No focal neurological deficits. Extremities: Symmetric 5 x 5 power. Left knewithout acute findings today. Skin: Dry appearing right sacral decubitus without signs of overt infection. Patient has skin tear over the right posterior upper arm. Left heel DTI without signs of acute infection. Psychiatry: Pleasant and comfortable today. Denies suicidal ideations.   Data Reviewed: Basic Metabolic Panel:  Recent Labs Lab 03/19/14 1508 03/19/14 1944 03/20/14 0108 03/20/14 0820 03/21/14 0357 03/22/14 0314 03/23/14 0350  NA 121*  --  129*  --  135* 133* 132*  K 3.8  --  3.2*  --  3.7 4.0 3.9  CL 82*  --  98  --  105 103 101  CO2 28  --  22  --  21 18* 16*  GLUCOSE 221*  --  150*  --  218* 276* 239*  BUN 24*  --  17  --  15 24* 30*  CREATININE 0.37* 0.32* 0.29*  --  0.25* 0.48* 0.69  CALCIUM 7.8*  --  6.5*  --  7.4* 7.4* 7.6*  MG  --   --   --  1.6  --  1.8 1.9  PHOS   --   --   --   --   --  1.9* 2.7   Liver Function Tests:  Recent Labs Lab 03/19/14 1508 03/20/14 0108 03/21/14 0357  AST 46* 60* 72*  ALT 12 11 15   ALKPHOS 219* 157* 203*  BILITOT 1.6* 1.0 1.0  PROT 6.6 4.9* 5.4*  ALBUMIN 1.2* 0.9* 1.0*   No results for input(s): LIPASE, AMYLASE in the last 168 hours. No results for input(s): AMMONIA in the last 168 hours. CBC:  Recent Labs Lab 03/19/14 1508  03/20/14 0108 03/20/14 0820 03/21/14 0357 03/22/14 0314 03/23/14 0350  WBC 4.2  < > 3.3* 4.0 5.4 6.7 7.7  NEUTROABS 3.8  --   --   --  4.4  --   --   HGB 7.9*  < > 7.0* 8.8* 8.7* 9.7* 9.9*  HCT 24.7*  < > 21.9* 27.1* 26.9* 30.2* 30.7*  MCV 84.3  < > 85.9 83.9 84.3 84.4 84.8  PLT 77*  < > 48* 63* 81* 113* 108*  < > = values in this interval not displayed. Cardiac Enzymes:  Recent Labs Lab 03/19/14 1944 03/20/14 0108 03/20/14 0820  TROPONINI 0.82* 7.66* >20.00*   BNP (last 3 results) No results for input(s): PROBNP in the last 8760 hours. CBG:  Recent Labs Lab 03/21/14 1220 03/21/14 1627 03/21/14 2223 03/22/14 0743 03/23/14 0746  GLUCAP 210* 235* 258* 250* 232*    Recent Results (from the past 240 hour(s))  Urine culture     Status: None   Collection Time: 03/19/14  3:04 PM  Result Value Ref Range Status   Specimen Description URINE, CATHETERIZED  Final   Special Requests NONE  Final   Culture  Setup Time   Final    03/20/2014 00:29 Performed at [facility] Count   Final    >=100,000 COLONIES/ML Performed at [facility]    Culture   Final    ESCHERICHIA COLI Note: Two isolates with different morphologies were identified as the same organism.The most resistant organism was reported. Performed at [facility]    Report Status 03/23/2014 FINAL  Final   Organism ID, Bacteria ESCHERICHIA COLI  Final      Susceptibility   Escherichia coli - MIC*    AMPICILLIN >=32 RESISTANT Resistant     CEFAZOLIN <=4 SENSITIVE  Sensitive     CEFTRIAXONE <=1 SENSITIVE Sensitive     CIPROFLOXACIN <=0.25 SENSITIVE Sensitive     GENTAMICIN <=1 SENSITIVE Sensitive     LEVOFLOXACIN <=0.12 SENSITIVE Sensitive     NITROFURANTOIN <=16 SENSITIVE Sensitive     TOBRAMYCIN <=1 SENSITIVE Sensitive     TRIMETH/SULFA >=320 RESISTANT Resistant     PIP/TAZO <=4 SENSITIVE Sensitive     * ESCHERICHIA COLI  Blood Culture (routine x 2)     Status: None   Collection Time: 03/19/14  3:23 PM  Result Value Ref Range Status   Specimen Description BLOOD RIGHT WRIST  Final   Special Requests BOTTLES DRAWN AEROBIC AND ANAEROBIC 5 ML  Final   Culture  Setup Time   Final    03/20/2014 07:48  Performed at [facility]   Final    STAPHYLOCOCCUS AUREUS Note: RIFAMPIN AND GENTAMICIN SHOULD NOT BE USED AS SINGLE DRUGS FOR TREATMENT OF STAPH INFECTIONS. Note: Gram Stain Report Called to,Read Back By and Verified With: FAYE S BY INGRAM A 03/20/14 11AM Performed at [facility]    Report Status 03/22/2014 FINAL  Final   Organism ID, Bacteria STAPHYLOCOCCUS AUREUS  Final      Susceptibility   Staphylococcus aureus - MIC*    CLINDAMYCIN <=0.25 SENSITIVE Sensitive     ERYTHROMYCIN <=0.25 SENSITIVE Sensitive     GENTAMICIN <=0.5 SENSITIVE Sensitive     LEVOFLOXACIN <=0.12 SENSITIVE Sensitive     OXACILLIN 0.5 SENSITIVE Sensitive     PENICILLIN RESISTANT      RIFAMPIN <=0.5 SENSITIVE Sensitive     TRIMETH/SULFA <=10 SENSITIVE Sensitive     VANCOMYCIN <=0.5 SENSITIVE Sensitive     TETRACYCLINE <=1 SENSITIVE Sensitive     MOXIFLOXACIN <=0.25 SENSITIVE Sensitive     * STAPHYLOCOCCUS AUREUS  Blood Culture (routine x 2)     Status: None   Collection Time: 03/19/14  3:23 PM  Result Value Ref Range Status   Specimen Description BLOOD BLOOD LEFT FOREARM  Final   Special Requests BOTTLES DRAWN AEROBIC AND ANAEROBIC 5 ML  Final   Culture  Setup Time   Final    03/20/2014 07:51 Performed at [facility]     Culture   Final    STAPHYLOCOCCUS AUREUS Note: SUSCEPTIBILITIES PERFORMED ON PREVIOUS CULTURE WITHIN THE LAST 5 DAYS. Note: Gram Stain Report Called to,Read Back By and Verified With: FAYE S BY INGRAM A 03/20/14 11AM Performed at [facility]    Report Status 03/22/2014 FINAL  Final  Culture, blood (routine x 2)     Status: None (Preliminary result)   Collection Time: 03/20/14  5:30 PM  Result Value Ref Range Status   Specimen Description BLOOD RIGHT HAND  Final   Special Requests BOTTLES DRAWN AEROBIC ONLY 8CC  Final   Culture  Setup Time   Final    03/21/2014 01:05 Performed at [facility]    Culture   Final           BLOOD CULTURE RECEIVED NO GROWTH TO DATE CULTURE WILL BE HELD FOR 5 DAYS BEFORE ISSUING A FINAL NEGATIVE REPORT Performed at [facility]    Report Status PENDING  Incomplete  Culture, blood (routine x 2)     Status: None (Preliminary result)   Collection Time: 03/20/14  5:37 PM  Result Value Ref Range Status   Specimen Description BLOOD LEFT ARM  Final   Special Requests   Final    BOTTLES DRAWN AEROBIC ONLY BLUE BOTTLE 9 CC, RED BOTTLE 1 CC   Culture  Setup Time   Final    03/21/2014 01:06 Performed at [facility]    Culture   Final           BLOOD CULTURE RECEIVED NO GROWTH TO DATE CULTURE WILL BE HELD FOR 5 DAYS BEFORE ISSUING A FINAL NEGATIVE REPORT Performed at [facility]    Report Status PENDING  Incomplete         Studies: Dg Chest Port 1 View  03/23/2014   CLINICAL DATA:  Pulmonary edema. Hypertension. Diabetes. Coronary artery disease.  EXAM: PORTABLE CHEST - 1 VIEW  COMPARISON:  03/21/2014  FINDINGS: Mild worsening of infiltrate seen in the left retrocardiac lung base.  Mild opacity also seen in the medial right lung base is suspicious for developing infiltrate. Tiny left pleural effusion cannot be excluded Heart size remains within normal limits.  IMPRESSION: Increased infiltrate in the left retrocardiac  lung base, and probably the medial right lung base as well.   Electronically Signed   By: Myles Rosenthal M.D.   On: 03/23/2014 07:09        Scheduled Meds: . antiseptic oral rinse  7 mL Mouth Rinse q12n4p  . aspirin  81 mg Oral Daily  . atorvastatin  20 mg Oral q1800  .  ceFAZolin (ANCEF) IV  2 g Intravenous Q8H  . chlorhexidine  15 mL Mouth Rinse BID  . collagenase   Topical Daily  . enoxaparin (LOVENOX) injection  40 mg Subcutaneous Q24H  . feeding supplement (GLUCERNA SHAKE)  237 mL Oral TID BM  . fludrocortisone  0.1 mg Oral Daily  . hydrocortisone sod succinate (SOLU-CORTEF) inj  50 mg Intravenous Q12H  . insulin aspart  0-5 Units Subcutaneous QHS  . insulin aspart  0-9 Units Subcutaneous TID WC  . insulin glargine  15 Units Subcutaneous Daily  . mirtazapine  30 mg Oral QHS  . sodium chloride  3 mL Intravenous Q12H   Continuous Infusions: . phenylephrine (NEO-SYNEPHRINE) Adult infusion Stopped (03/23/14 0800)    Active Problems:   DM2 (diabetes mellitus, type 2)   Physical deconditioning   Hypoalbuminemia   Hyponatremia   Severe sepsis   Acute UTI   Protein-calorie malnutrition, severe   Sacral decubitus ulcer   Acute pulmonary edema   Effusion into joint   ST elevation myocardial infarction (STEMI) of inferolateral wall, initial episode of care   Severe sepsis with septic shock   Diabetic ulcer of left foot associated with diabetes mellitus due to underlying condition   Osteomyelitis   Ulcer of heel   Acute osteomyelitis of femur   Septic arthritis of knee, left   STEMI (ST elevation myocardial infarction)   Ulcer of left heel    Time spent: 40 minutes.    Marcellus Scott, MD, FACP, FHM. [facility] Pager [phone]  If 7PM-7AM, please contact night-coverage [email or web address] Password [email or web address] 03/23/2014, 11:56 AM    LOS: 4 days

## 2014-03-23 NOTE — Progress Notes (Signed)
Patient ID: Robin Schroeder, female   DOB: [birth year], 58 y.o.   MRN: [id number]    SUBJECTIVE:  Patient is stable in bed this morning. The sitter is in the room. IV pressors are being weaned.   Filed Vitals:   03/23/14 0500 03/23/14 0600 03/23/14 0700 03/23/14 0800  BP: 111/68 127/59 103/62   Pulse: 61 49 55   Temp:    97.3 F (36.3 C)  TempSrc:    Oral  Resp: 19 16 17    Height:      Weight: 193 lb 2 oz (87.6 kg)     SpO2: 98% 100% 100%      Intake/Output Summary (Last 24 hours) at 03/23/14 0845 Last data filed at 03/23/14 0700  Gross per 24 hour  Intake   2051 ml  Output    545 ml  Net   1506 ml    LABS: Basic Metabolic Panel:  Recent Labs  2052 0314 03/23/14 0350  NA 133* 132*  K 4.0 3.9  CL 103 101  CO2 18* 16*  GLUCOSE 276* 239*  BUN 24* 30*  CREATININE 0.48* 0.69  CALCIUM 7.4* 7.6*  MG 1.8 1.9  PHOS 1.9* 2.7   Liver Function Tests:  Recent Labs  03/21/14 0357  AST 72*  ALT 15  ALKPHOS 203*  BILITOT 1.0  PROT 5.4*  ALBUMIN 1.0*   No results for input(s): LIPASE, AMYLASE in the last 72 hours. CBC:  Recent Labs  03/21/14 0357 03/22/14 0314 03/23/14 0350  WBC 5.4 6.7 7.7  NEUTROABS 4.4  --   --   HGB 8.7* 9.7* 9.9*  HCT 26.9* 30.2* 30.7*  MCV 84.3 84.4 84.8  PLT 81* 113* 108*   Cardiac Enzymes: No results for input(s): CKTOTAL, CKMB, CKMBINDEX, TROPONINI in the last 72 hours. BNP: Invalid input(s): POCBNP D-Dimer: No results for input(s): DDIMER in the last 72 hours. Hemoglobin A1C:  Recent Labs  03/21/14 0357  HGBA1C 6.8*   Fasting Lipid Panel: No results for input(s): CHOL, HDL, LDLCALC, TRIG, CHOLHDL, LDLDIRECT in the last 72 hours. Thyroid Function Tests: No results for input(s): TSH, T4TOTAL, T3FREE, THYROIDAB in the last 72 hours.  Invalid input(s): FREET3  RADIOLOGY: Dg Chest Port 1 View  03/23/2014   CLINICAL DATA:  Pulmonary edema. Hypertension. Diabetes. Coronary artery disease.  EXAM: PORTABLE CHEST - 1  VIEW  COMPARISON:  03/21/2014  FINDINGS: Mild worsening of infiltrate seen in the left retrocardiac lung base. Mild opacity also seen in the medial right lung base is suspicious for developing infiltrate. Tiny left pleural effusion cannot be excluded Heart size remains within normal limits.  IMPRESSION: Increased infiltrate in the left retrocardiac lung base, and probably the medial right lung base as well.   Electronically Signed   By: 14/08/2013 M.D.   On: 03/23/2014 07:09   Dg Chest Port 1 View  03/21/2014   CLINICAL DATA:  Acute pulmonary edema. History of hypertension, diabetes and coronary artery disease. Initial encounter.  EXAM: PORTABLE CHEST - 1 VIEW  COMPARISON:  Radiographs 01/27/2014 and 03/20/2014.  FINDINGS: 1929 hr. The heart size and mediastinal contours are stable. There is persistent elevation of the left hemidiaphragm with increased left basilar airspace disease. The right lung is clear. There is no pneumothorax or significant pleural effusion. No acute osseous findings are demonstrated. Degenerative changes are present throughout the thoracic spine and at both shoulders. Telemetry leads overlie the chest.  IMPRESSION: Progressive left lower lobe airspace disease. Although in part chronic,  this could reflect superimposed aspiration. No overt pulmonary edema.   Electronically Signed   By: Roxy Horseman M.D.   On: 03/21/2014 08:10   Dg Chest Port 1 View  03/20/2014   CLINICAL DATA:  Fever and sepsis  EXAM: PORTABLE CHEST - 1 VIEW  COMPARISON:  March 19, 2014 study obtained earlier in the day ; January 27, 2014  FINDINGS: There is mild cardiomegaly with mild generalized interstitial edema. There is atelectatic change in the left base with small left effusion. Pulmonary vascularity is within normal limits. Bones are osteoporotic.  IMPRESSION: Cardiomegaly with interstitial edema. These findings are felt to represent a degree of congestive heart failure. Atelectasis left base. Small left  effusion.   Electronically Signed   By: Bretta Bang M.D.   On: 03/20/2014 09:34   Dg Chest Port 1 View  (if Code Sepsis Called)  03/19/2014   CLINICAL DATA:  Failure to thrive  EXAM: PORTABLE CHEST - 1 VIEW  COMPARISON:  01/27/2014  FINDINGS: Patient is rotated considerably to the left and the study was not repeated. Study is significantly limited. Lungs are grossly clear.  IMPRESSION: Recommend repeat study. The patient is rotated considerably. Lungs are grossly clear.   Electronically Signed   By: Marlan Palau M.D.   On: 03/19/2014 14:51   Dg Knee Left Port  03/20/2014   CLINICAL DATA:  Bilateral foot can knee pain, initial evaluation, multiple open wounds due to bacterial infection, bilateral knee effusions  EXAM: PORTABLE LEFT KNEE - 1-2 VIEW  COMPARISON:  None.  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 The medial cortex of the medial tibial plateau demonstrates loss of definition. This is concerning for the possibility of osteomyelitis. The cortex along the medial margin of the distal medial femoral condyles is also indistinct. There is medial soft tissue swelling at the joint line.  IMPRESSION: The findings are concerning for cellulitis with underlying osteomyelitis medially.   Electronically Signed   By: Esperanza Heir M.D.   On: 03/20/2014 17:51   Dg Knee Right Port  03/20/2014   CLINICAL DATA:  Bilateral knee pain  EXAM: PORTABLE RIGHT KNEE - 1-2 VIEW  COMPARISON:  None.  FINDINGS: Severe tricompartmental degenerative change is noted. Small suprapatellar effusion. No fracture or dislocation. No radiopaque foreign body.  IMPRESSION: Severe tricompartmental degenerative change.   Electronically Signed   By: Christiana Pellant M.D.   On: 03/20/2014 17:51   Dg Foot Complete Left  03/20/2014   CLINICAL DATA:  Left heel wound  EXAM: LEFT FOOT - COMPLETE 3+ VIEW  COMPARISON:  None.  FINDINGS: There is extensive artifact from material overlying the foot which  significantly decreases sensitivity and specificity for detection of osseous or soft tissue abnormalities. No grossly displaced fracture is identified. Bones are subjectively osteopenic. Dorsal soft tissue swelling is evident with plantar calcaneal spurring and vascular calcifications noted.  IMPRESSION: Suboptimal visualization due to support apparatus, but with nonspecific dorsal soft tissue swelling identified.   Electronically Signed   By: Christiana Pellant M.D.   On: 03/20/2014 17:07   Dg Foot Complete Right  03/20/2014   CLINICAL DATA:  Bilateral foot pain, open skin wound  EXAM: RIGHT FOOT COMPLETE - 3+ VIEW  COMPARISON:  None.  FINDINGS: Bones are subjectively osteopenic. Positioning is suboptimal due to patient immobility and portable technique. Degenerative changes are noted at the midfoot. No fracture or dislocation allowing for positioning. Plantar calcaneal spurring noted. There is soft tissue swelling involving  the dorsum of the foot and adjacent to the lateral malleolus. No radiopaque foreign body.  IMPRESSION: Subjective osteopenia without acute osseous abnormality.  Dorsal soft tissue swelling and swelling over the lateral malleolus. Correlate for cellulitis or skin abnormalities in this area.   Electronically Signed   By: Christiana Pellant M.D.   On: 03/20/2014 17:51    PHYSICAL EXAM  Patient is oriented to person time and place. Affect is normal. Lungs reveal scattered rhonchi. Cardiac exam reveals an S1 and S2. She does have edema.  TELEMETRY: I have reviewed telemetry today March 23, 2014. There is sinus rhythm and sinus bradycardia.   ASSESSMENT AND PLAN:  ST elevation MI    It was noted that the patient had ST elevation yesterday. The troponin was up to 20. I do not see a follow-up troponin yet today. At this point the patient's other medical problems preclude an aggressive cardiac workup. She is receiving medical therapy. She has bradycardia. Echocardiogram cannot be read because  the system is down. Plan to continue to wean her pressors. Plan to continue to treat her other medical problems. Await assessment of LV function.       DM2 (diabetes mellitus, type 2)   Physical deconditioning   Hypoalbuminemia   Hyponatremia   Severe sepsis   Acute UTI   Protein-calorie malnutrition, severe   Sacral decubitus ulcer   Acute pulmonary edema   Effusion into joint   ST elevation myocardial infarction (STEMI) of inferolateral wall, initial episode of care   Severe sepsis with septic shock   Diabetic ulcer of left foot associated with diabetes mellitus due to underlying condition   Osteomyelitis   Ulcer of heel   Acute osteomyelitis of femur   Septic arthritis of knee, left   Ulcer of left heel   Willa Rough 03/23/2014 8:45 AM

## 2014-03-23 NOTE — Progress Notes (Signed)
Anticoagulation Note  A/P: Per cardiology notes, plan was to continue IV heparin for 48 hrs for acute STEMI then to stop at that point. Today will mark 48 hours since start of IV heparin. Discussed with Dr. Myrtis Ser, and will stop IV heparin now and start Lovenox for DVT prophylaxis. Start Lovenox 40mg  SQ q24 1 hour after IV heparin stopped. Continue to monitor CBC and renal function  , PharmD, BCPS Pager [phone] 03/23/2014 10:47 AM

## 2014-03-23 NOTE — Progress Notes (Signed)
ANTICOAGULATION CONSULT NOTE   Pharmacy Consult for heparin Indication: chest pain/ACS  Allergies  Allergen Reactions  . Penicillins Rash    Patient Measurements: Height: 5\' 4"  (162.6 cm) Weight: 193 lb 2 oz (87.6 kg) IBW/kg (Calculated) : 54.7 Heparin Dosing Weight: 77kg  Vital Signs: Temp: 97.4 F (36.3 C) (12/07 0300) Temp Source: Axillary (12/07 0300) BP: 103/62 mmHg (12/07 0700) Pulse Rate: 55 (12/07 0700)  Labs:  Recent Labs  03/21/14 0357 03/21/14 0954  03/22/14 0314 03/22/14 1217 03/22/14 1943 03/23/14 0350  HGB 8.7*  --   --  9.7*  --   --  9.9*  HCT 26.9*  --   --  30.2*  --   --  30.7*  PLT 81*  --   --  113*  --   --  108*  LABPROT  --  15.3*  --   --   --   --   --   INR  --  1.20  --   --   --   --   --   HEPARINUNFRC  --   --   < > <0.10* 0.47 0.51 0.75*  CREATININE 0.25*  --   --  0.48*  --   --  0.69  < > = values in this interval not displayed.  Estimated Creatinine Clearance: 80.2 mL/min (by C-G formula based on Cr of 0.69).  Infusions:  . heparin 1,450 Units/hr (03/23/14 0600)  . phenylephrine (NEO-SYNEPHRINE) Adult infusion 25 mcg/min (03/23/14 0600)    Assessment: 58 yoF admitted 12/3 from home refusing care, wanting to hurt herself. Pt with recent admission in October for MSSA bacteremia and sepsis. Pt appeared septic on admission and was started on broad spectrum antibiotics per Pharmacy.   Mildly elevated troponin noted on admission, MD thought secondary to demand ischemia due to infectious process. Pt with critical troponin overnight 12/4, also with anemia, thrombocytopenia. MD notes ECG is unchanged from 10/14, minimal STE were present then. Cardiology recommended starting heparin 12/4, patient is poor cath candidate considering ongoing sepsis, anemia and thrombocytopenia. Heparin not started as patient has been asymptomatic.   Troponins continued to increase with peak of >20 on 12/4, and Cardiology MD notes more changes on EKG.  Pharmacy is now consulted to dose heparin without bolus for 48 hours beginning on 12/5  Today, 12/7:   Heparin level now slightly supratherapeutic and has been slowly rising over the last 24hrs after rate increased to 1450 units/hr on 12/6 AM  No issues with infusion or bleeding per RN and confirmed that rate of heparin is 1450 units/hr as ordered  Hgb stable at goal > 8.0 per MD, currently Hgb=9.7  plts stable around 100k  Noted patient placed on aspirin 81mg  daily per cardiology as long as plt > 50k  Note per cards to continue IV heparin x 24 more hours as per 12/6 note  Goal of Therapy:  Heparin level 0.3-0.7 units/ml Monitor platelets by anticoagulation protocol: Yes   Plan:  1) Decrease IV heparin rate from 1450 units/hr to 1200 units/hr 2) Recheck heparin level in 6 hours after rate change if IV heparin not stopped per cards prior to that 3) What is plan for IV heparin - still d/c today which would be 48hrs after start of heparin as originally planned?   , PharmD, BCPS Pager [phone] 03/23/2014 8:33 AM

## 2014-03-23 NOTE — Progress Notes (Signed)
PULMONARY / CRITICAL CARE MEDICINE   Name: Robin Schroeder MRN: [id number] DOB: 1955-11-11    ADMISSION DATE:  03/19/2014 CONSULTATION DATE:  03/20/14  REFERRING MD :  Dr. Waymon Amato   CHIEF COMPLAINT:  Sepsis   INITIAL PRESENTATION: 58 y/o F with multiple medical problems and recent d/c in 01/2014 for MSSA bacteremia & sepsis admitted on 12/3 for decreased intake, concerns of intentional self harm and decubitus ulcers.  Persistently hypotensive and PCCM consulted for evaluation.   STUDIES:  12/04  ECHO >>   SIGNIFICANT EVENTS: 10/13-10/21/15  Admit with MSSA bacteremia.  NEG TEE for vegetations.  Work up + for RA.   10/30  Completed IV Cefazolin, PICC line d/c'd  11/02  Followed up with Dr. Drue Second, rx'd with 5mg  QD pred for RA sx  12/03  Admit with hypotension, decreased intake, concerns of intentional self harm (IVC'd by husband on admit) and decubitus ulcers. 12/04  Persistent hypotension, Stress steroids initiated and PCCM consulted for evaluation  SUBJECTIVE: Off neo gtt this am Denies pain Afebrile UO poor  VITAL SIGNS: Temp:  [97.3 F (36.3 C)-97.8 F (36.6 C)] 97.3 F (36.3 C) (12/07 0800) Pulse Rate:  [46-72] 55 (12/07 0700) Resp:  [16-24] 17 (12/07 0700) BP: (99-135)/(49-70) 103/62 mmHg (12/07 0700) SpO2:  [98 %-100 %] 100 % (12/07 0700) Weight:  [87.6 kg (193 lb 2 oz)] 87.6 kg (193 lb 2 oz) (12/07 0500)   HEMODYNAMICS:     VENTILATOR SETTINGS:     INTAKE / OUTPUT:  Intake/Output Summary (Last 24 hours) at 03/23/14 0852 Last data filed at 03/23/14 0700  Gross per 24 hour  Intake   2051 ml  Output    545 ml  Net   1506 ml    PHYSICAL EXAMINATION: General:  No distress, depressed affect Neuro:  nonfocal uppers, perrl HEENT:  Obese, no jvd Cardiovascular:  s1 s2 RRR, distant Lungs:  Coarse, cough clear Abdomen:  Echymosis, no r/g, soft, nontener Musculoskeletal:  Back no erythema cerv to lumbar Skin:  brusing inner thighs, knees bilateral, deformed  rt leg laterally  LABS:  CBC  Recent Labs Lab 03/21/14 0357 03/22/14 0314 03/23/14 0350  WBC 5.4 6.7 7.7  HGB 8.7* 9.7* 9.9*  HCT 26.9* 30.2* 30.7*  PLT 81* 113* 108*   Coag's  Recent Labs Lab 03/19/14 1508 03/21/14 0954  INR 1.29 1.20   BMET  Recent Labs Lab 03/21/14 0357 03/22/14 0314 03/23/14 0350  NA 135* 133* 132*  K 3.7 4.0 3.9  CL 105 103 101  CO2 21 18* 16*  BUN 15 24* 30*  CREATININE 0.25* 0.48* 0.69  GLUCOSE 218* 276* 239*   Electrolytes  Recent Labs Lab 03/20/14 0820 03/21/14 0357 03/22/14 0314 03/23/14 0350  CALCIUM  --  7.4* 7.4* 7.6*  MG 1.6  --  1.8 1.9  PHOS  --   --  1.9* 2.7   Sepsis Markers  Recent Labs Lab 03/19/14 1542 03/19/14 1719 03/20/14 0820  LATICACIDVEN 2.52* 1.85 1.6   ABG  Recent Labs Lab 03/19/14 1523  PHART 7.484*  PCO2ART 40.8  PO2ART 78.7*   Liver Enzymes  Recent Labs Lab 03/19/14 1508 03/20/14 0108 03/21/14 0357  AST 46* 60* 72*  ALT 12 11 15   ALKPHOS 219* 157* 203*  BILITOT 1.6* 1.0 1.0  ALBUMIN 1.2* 0.9* 1.0*   Cardiac Enzymes  Recent Labs Lab 03/19/14 1944 03/20/14 0108 03/20/14 0820  TROPONINI 0.82* 7.66* >20.00*   Glucose  Recent Labs Lab 03/21/14 14/04/15  03/21/14 1220 03/21/14 1627 03/21/14 2223 03/22/14 0743 03/23/14 0746  GLUCAP 181* 210* 235* 258* 250* 232*   Imaging No results found. ASSESSMENT / PLAN:  PULMONARY OETT n/a A: Mild Pulmonary Edema - noted on CXR, small effusion LL atx vs effusion P:   Oxygen to support sats > 92% Pulmonary hygiene:  IS, Mobilize if able   CARDIOVASCULAR CVL n/a A:  Septic Shock - staph NSTEMI - troponin > 20 HLD P:  Cardiology following Unable to receive heparin / ASA due to thrombocytopenia / anemia Assess ECHO for vegetation / myocarditis   Neosynephrine off  Lipitor.  RENAL A:   Hyponatremia  Hypochloremia  hypoK Oliguria, ana sarca P:   KVO IVF Would start lasix , if remains off pressors x  24h   GASTROINTESTINAL A:   Protein Calorie Malnutrition  Nausea  P:   Nutrition consult appreciated Diet as tolerated  PRN zofran  HEMATOLOGIC A:   Severe Thrombocytopenia - sepsis / bacteremia Anemia P:  Trend CBC Tx for Hgb <7% (ACS) or active bleeding   INFECTIOUS A:   Septic Shock - MSSA,recurrent, r/o endocarditis, decub (unlikely), joint involvement seeding? Decubitus Ulcers - sacral ?L knee osteo P:   BCx2 12/03 >> MSSA UC 12/03 >>  Abx: Vanco, 12/3>>>12/6 Ancef >> WOC consult  Recommendations per ID Echo important for veg now, may need TEE if neg TTE, TTE done and pending.   ENDOCRINE A:   Hyperglycemia  Adrenal Insufficiency  P:   Stress steroids -can be tapered to off now that off pressors, back to Baseline 5mg  prednisone (started 02/16/14) SSI -increase lantus 15 Continue florinef  NEUROLOGIC A:   Chronic Pain Rheumatoid Arthritis  Severe depression P:   Minimize narcotics as able Psych input  PCCM to sign off , unclear why she has been bed bound x 37months, would at least ask for PT evaluation  FAMILY  - Updates: patient and husband updated bedside.  Not even a central line.  After discussion, will make patient LCB with pressors only and only neosynephrine up to 100 mcg given peripheral infusion.  - Inter-disciplinary family meet or Palliative Care meeting due by: 12/11  The patient is critically ill with multiple organ systems failure and requires high complexity decision making for assessment and support, frequent evaluation and titration of therapies, application of advanced monitoring technologies and extensive interpretation of multiple databases.   Critical Care Time devoted to patient care services described in this note is  31  Minutes.   14/11 MD  03/23/2014, 8:52 AM

## 2014-03-23 NOTE — Progress Notes (Signed)
ECG completed

## 2014-03-24 DIAGNOSIS — F39 Unspecified mood [affective] disorder: Secondary | ICD-10-CM

## 2014-03-24 LAB — BASIC METABOLIC PANEL
Anion gap: 13 (ref 5–15)
BUN: 38 mg/dL — AB (ref 6–23)
CALCIUM: 7.7 mg/dL — AB (ref 8.4–10.5)
CO2: 18 mEq/L — ABNORMAL LOW (ref 19–32)
CREATININE: 0.97 mg/dL (ref 0.50–1.10)
Chloride: 101 mEq/L (ref 96–112)
GFR calc Af Amer: 72 mL/min — ABNORMAL LOW (ref >90)
GFR, EST NON AFRICAN AMERICAN: 62 mL/min — AB (ref >90)
GLUCOSE: 123 mg/dL — AB (ref 70–99)
Potassium: 3.8 mEq/L (ref 3.7–5.3)
SODIUM: 132 meq/L — AB (ref 137–147)

## 2014-03-24 LAB — CBC
HCT: 27.3 % — ABNORMAL LOW (ref 36.0–46.0)
Hemoglobin: 8.9 g/dL — ABNORMAL LOW (ref 12.0–15.0)
MCH: 26.8 pg (ref 26.0–34.0)
MCHC: 32.6 g/dL (ref 30.0–36.0)
MCV: 82.2 fL (ref 78.0–100.0)
Platelets: 91 10*3/uL — ABNORMAL LOW (ref 150–400)
RBC: 3.32 MIL/uL — ABNORMAL LOW (ref 3.87–5.11)
RDW: 16.7 % — AB (ref 11.5–15.5)
WBC: 3.3 10*3/uL — ABNORMAL LOW (ref 4.0–10.5)

## 2014-03-24 LAB — GLUCOSE, CAPILLARY
GLUCOSE-CAPILLARY: 159 mg/dL — AB (ref 70–99)
Glucose-Capillary: 95 mg/dL (ref 70–99)
Glucose-Capillary: 116 mg/dL — ABNORMAL HIGH (ref 70–99)
Glucose-Capillary: 162 mg/dL — ABNORMAL HIGH (ref 70–99)
Glucose-Capillary: 124 mg/dL — ABNORMAL HIGH (ref 70–99)

## 2014-03-24 LAB — TROPONIN I: Troponin I: 2.28 ng/mL (ref <0.30)

## 2014-03-24 MED ORDER — SODIUM CHLORIDE 0.9 % IV SOLN
INTRAVENOUS | Status: DC
  Administered 2014-03-25: 05:00:00 via INTRAVENOUS

## 2014-03-24 MED ORDER — ENOXAPARIN SODIUM 40 MG/0.4ML ~~LOC~~ SOLN
40.0000 mg | SUBCUTANEOUS | Status: DC
  Administered 2014-03-26 (×2): 40 mg via SUBCUTANEOUS
  Filled 2014-03-24 (×3): qty 0.4

## 2014-03-24 MED ORDER — METOPROLOL TARTRATE 1 MG/ML IV SOLN
INTRAVENOUS | Status: AC
Start: 2014-03-24 — End: 2014-03-24
  Filled 2014-03-24: qty 5

## 2014-03-24 MED ORDER — PREDNISONE 20 MG PO TABS
20.0000 mg | ORAL_TABLET | Freq: Every day | ORAL | Status: DC
  Administered 2014-03-25 – 2014-03-29 (×5): 20 mg via ORAL
  Filled 2014-03-24 (×6): qty 1

## 2014-03-24 MED ORDER — SODIUM CHLORIDE 0.9 % IJ SOLN
10.0000 mL | Freq: Two times a day (BID) | INTRAMUSCULAR | Status: DC
  Administered 2014-03-24 – 2014-03-28 (×7): 10 mL
  Administered 2014-03-29: 20 mL
  Administered 2014-03-29 – 2014-03-31 (×3): 10 mL

## 2014-03-24 MED ORDER — ENSURE COMPLETE PO LIQD
237.0000 mL | Freq: Three times a day (TID) | ORAL | Status: DC
  Administered 2014-03-24 – 2014-04-06 (×7): 237 mL via ORAL

## 2014-03-24 MED ORDER — SODIUM CHLORIDE 0.9 % IV SOLN
INTRAVENOUS | Status: DC
  Administered 2014-03-24: 1000 mL via INTRAVENOUS

## 2014-03-24 MED ORDER — OLANZAPINE 5 MG PO TBDP
5.0000 mg | ORAL_TABLET | Freq: Every day | ORAL | Status: DC
  Administered 2014-03-24 – 2014-04-02 (×8): 5 mg via ORAL
  Filled 2014-03-24 (×14): qty 1

## 2014-03-24 MED ORDER — HYDROMORPHONE HCL 1 MG/ML IJ SOLN
0.5000 mg | INTRAMUSCULAR | Status: DC | PRN
  Administered 2014-03-24 – 2014-03-25 (×5): 1 mg via INTRAVENOUS
  Administered 2014-03-26: 0.5 mg via INTRAVENOUS
  Administered 2014-03-26: 1 mg via INTRAVENOUS
  Administered 2014-04-01 – 2014-04-02 (×2): 0.5 mg via INTRAVENOUS
  Administered 2014-04-05 (×3): 1 mg via INTRAVENOUS
  Filled 2014-03-24 (×12): qty 1

## 2014-03-24 MED ORDER — METOPROLOL TARTRATE 1 MG/ML IV SOLN
2.5000 mg | Freq: Once | INTRAVENOUS | Status: AC
  Administered 2014-03-24: 2.5 mg via INTRAVENOUS

## 2014-03-24 MED ORDER — SODIUM CHLORIDE 0.9 % IJ SOLN
10.0000 mL | INTRAMUSCULAR | Status: DC | PRN
  Administered 2014-03-27 – 2014-04-05 (×5): 10 mL
  Administered 2014-04-06: 20 mL
  Administered 2014-04-06: 10 mL
  Filled 2014-03-24 (×7): qty 40

## 2014-03-24 MED ORDER — FUROSEMIDE 10 MG/ML IJ SOLN
40.0000 mg | Freq: Two times a day (BID) | INTRAMUSCULAR | Status: DC
  Administered 2014-03-24 (×2): 40 mg via INTRAVENOUS
  Filled 2014-03-24 (×2): qty 4

## 2014-03-24 NOTE — Progress Notes (Signed)
Clinical Social Work  CSW met with patient and husband at bedside. Patient introduced myself to husband and explained role. Patient laying in bed with eyes closed and did not engage throughout assessment except for answering that she was feeling a little better and denied any SI or HI. Patient's husband reports that patient often closes her eyes to avoid uncomfortable situations but knows that she is listening.  Patient and husband have been married for almost 18 years and have no children together. Husband reports that patient "is my everything since we don't have children." Husband reports he was worried about patient's medical wellbeing and that is why he IVCed her to the hospital. Patient was at [facility] and went to [facility] for rehab. Husband reports that patient did not like staying at rehab because she is a quiet person and does not like others to invade her privacy. Patient stayed at [facility] for 4 weeks prior to returning home. Husband reports they had equipment delivered to the house but patient was unable to walk and he struggled with caring for her. Husband reports he called EMS a couple of times but patient refused to come to the hospital. Husband reports EMS encouraged him to go to [place] and have patient committed to the hospital. CSW explained that psych MD reports that patient does have capacity so she is able to make her own decisions. Husband reports understanding and agrees with MD recommendations. Husband reports patient remains weak and deconditioned and feels like SNF will be needed again at DC.   Husband reports he feels that patient's depression is improving but knows that she is still upset and lonely at times. Husband continues to work but patient has not worked since December 2014. Husband reports that patient has an infection and feels she is not eating as much because she feels ill. Husband thanked CSW for visiting and reports that depending on patient's progress, patient  could benefit from outpatient psych follow up.  CSW will continue to follow to provide support during hospitalization.  [place], [place] [phone]

## 2014-03-24 NOTE — Progress Notes (Signed)
Troponins trending back down in this lady with known STEMI during this hospitalization. Conservative mngt. Craige Cotta, NP

## 2014-03-24 NOTE — Progress Notes (Addendum)
[facility]    Date of Admission:  03/19/2014   Total days of antibiotics 6        Day 4 cefazolin           ID: Robin Schroeder is a 58 y.o. female with recurrent MSSA bacteremia c/b STEMI, AKI  Active Problems:   DM2 (diabetes mellitus, type 2)   Physical deconditioning   Hypoalbuminemia   Hyponatremia   Severe sepsis   Acute UTI   Protein-calorie malnutrition, severe   Sacral decubitus ulcer   Acute pulmonary edema   Effusion into joint   ST elevation myocardial infarction (STEMI) of inferolateral wall, initial episode of care   Severe sepsis with septic shock   Diabetic ulcer of left foot associated with diabetes mellitus due to underlying condition   Osteomyelitis   Ulcer of heel   Acute osteomyelitis of femur   Septic arthritis of knee, left   STEMI (ST elevation myocardial infarction)   Ulcer of left heel   Metabolic acidosis    Subjective: Afebrile, WBC is back to baseline. Still diffusely tender to all joints. Has had poor po intake. Only opens eyes briefly but does not really answer any questions. She is guarding when moving her extremities. picc line placed today  Echo report shows LVEF 50 to 55% with distal inferior and apical hypokinesis that is new.    Medications:  . antiseptic oral rinse  7 mL Mouth Rinse q12n4p  . aspirin  81 mg Oral Daily  . atorvastatin  20 mg Oral q1800  .  ceFAZolin (ANCEF) IV  2 g Intravenous Q8H  . chlorhexidine  15 mL Mouth Rinse BID  . collagenase   Topical Daily  . enoxaparin (LOVENOX) injection  40 mg Subcutaneous Q24H  . feeding supplement (GLUCERNA SHAKE)  237 mL Oral TID BM  . fludrocortisone  0.1 mg Oral Daily  . furosemide  40 mg Intravenous BID  . hydrocortisone sod succinate (SOLU-CORTEF) inj  50 mg Intravenous Q12H  . insulin aspart  0-5 Units Subcutaneous QHS  . insulin aspart  0-9 Units Subcutaneous TID WC  . insulin glargine  15 Units Subcutaneous Daily  . mirtazapine  15 mg  Oral QHS  . OLANZapine zydis  2.5 mg Oral QHS  . sodium bicarbonate  650 mg Oral TID  . sodium chloride  10-40 mL Intracatheter Q12H    Objective: Vital signs in last 24 hours: Temp:  [97.3 F (36.3 C)-98.5 F (36.9 C)] 98.5 F (36.9 C) (12/08 1200) Pulse Rate:  [71-128] 94 (12/08 1527) Resp:  [15-21] 18 (12/08 1527) BP: (88-131)/(40-71) 92/54 mmHg (12/08 1527) SpO2:  [90 %-97 %] 97 % (12/08 1527) Weight:  [198 lb 6.6 oz (90 kg)] 198 lb 6.6 oz (90 kg) (12/08 0400) Physical Exam  Constitutional: . appears anasarcic, disheveled. In mild distress from pain with movement HENT:  Mouth/Throat: Oropharynx is clear and moist. No oropharyngeal exudate. Poor dentition Cardiovascular: Normal rate, regular rhythm and normal heart sounds. Exam reveals no gallop and no friction rub.  No murmur heard.  Pulmonary/Chest: Effort normal and breath sounds normal. No respiratory distress.  has no wheezes.  Abdominal: Soft. Bowel sounds are normal.  exhibits no distension. There is no tenderness.  Lymphadenopathy: no cervical adenopathy.  Neurological: alert and oriented to person, place, and time.  Skin: heel ulcer to medial aspect of left leg. echymosis to right foot sole, decub to right calf Ext: diffuse anasarca to L> R  arm, but also anasarca to all extremities Psychiatric: flat affect, not answering qeustions  Lab Results  Recent Labs  03/23/14 0350 03/24/14 0335  WBC 7.7 3.3*  HGB 9.9* 8.9*  HCT 30.7* 27.3*  NA 132* 132*  K 3.9 3.8  CL 101 101  CO2 16* 18*  BUN 30* 38*  CREATININE 0.69 0.97   Lab Results  Component Value Date   ESRSEDRATE 90* 03/21/2014   Lab Results  Component Value Date   CRP 16.1* 03/21/2014    Microbiology: 12/3 blood cx x 2 MSSA 12/4 blood cx NGTD  Studies/Results: Dg Chest Port 1 View  03/23/2014   CLINICAL DATA:  Pulmonary edema. Hypertension. Diabetes. Coronary artery disease.  EXAM: PORTABLE CHEST - 1 VIEW  COMPARISON:  03/21/2014  FINDINGS:  Mild worsening of infiltrate seen in the left retrocardiac lung base. Mild opacity also seen in the medial right lung base is suspicious for developing infiltrate. Tiny left pleural effusion cannot be excluded Heart size remains within normal limits.  IMPRESSION: Increased infiltrate in the left retrocardiac lung base, and probably the medial right lung base as well.   Electronically Signed   By: Myles Rosenthal M.D.   On: 03/23/2014 07:09    Assessment/Plan: Recurrent MSSA bacteremia but also has numerous pressure ulcers, unable to mobilize due to deconditioning and polyarthritis from untreated rheumatoid arthritis,(newly diagnosed in Oct 2015)  MSSA bacteremia = still need to determine source of infection. Due to ACS, she is unable to undergo extensive sedation.  Will need TEE and MRI when stable, but do not see that in the near future. Would get MRI of left foot and ankle and mri of plevis to see if septic arthritis or osteomyelitis that would explain MSSA bacteremia recurrence.   For now keep on renally dosed cefazolin, she continues to have AKI, cr up to 0.9 with her baseline at 0.3. Will likely place her on continued IV antibiotics for 6 wks.   Knee effusion may also be source of infection, recommend to have IR do bilateral arthrocentesis. To send for culture, cell count and crystals  aki = appears to have prerenal azotemia but also possibly due to vanco toxicity since vanco trough was 32 on 12/6. Would increase fluids. Would observe closely since her baseline cr is 0.3 and has already increased to 0.9. Will need renally dosed regimen of her medications  Anasarca  2/2 protein caloric malnutrition= i would side with her getting PEG rather than TPN. Her malnutrition probably occurred over a period of a few months. I am concern that her lack of appetite is likely due to depression or other mental illness  Pressure ulcer = continue with local wound care  Newly diagnosed Rheumatoid Arthritis = would  recommend to do higher doses of prednisone 15 mg daily to see if she has temporary relief of joint pain, possibly increased appetite. Would attempt to have her be seen by Rheumatology as an inpatient.  it does not appear that they followed thru with getting PCP/rheum referral. I suspect her immobility is in part due other polyarthritis from RA.   - recommend to see if can get rheumatology input while she is hospitalized since she was not able to establish care on her own. I am concern that she nor her husband are able to navigate the health care system on their own.  ACS= in setting of sepsis from MSSA. Will defer to cardiology for management. Echo report shows LVEF 50 to 55% with distal inferior and  apical hypokinesis that is new.  Note plans for MRI WIll need to be followed closely with close monitoring of BP/O2 with sedation.   Depression = please have Dr. Shela Commons make further recommendations on management. I will have TSH and FT4 checked in the morning labs  [facility], [facility] Infectious Diseases Cell: [phone] Pager: [phone]  03/24/2014, 3:52 PM

## 2014-03-24 NOTE — Progress Notes (Signed)
Nutrition Consult  A/P: Had discussion with Md after speaking with dietitian. Family is considering tube feeds after discussion with dietitian and will likely decide later on today after dietitian checks back with patient/family. If they decide on this option, this would be preferred since patient's gut is working at this point rather than start TPN. Will follow up and discuss with Md again after family makes decision.  Hessie Knows, PharmD, BCPS Pager [phone] 03/24/2014 10:54 AM

## 2014-03-24 NOTE — Progress Notes (Signed)
Peripherally Inserted Central Catheter/Midline Placement  The IV Nurse has discussed with the patient and/or persons authorized to consent for the patient, the purpose of this procedure and the potential benefits and risks involved with this procedure.  The benefits include less needle sticks, lab draws from the catheter and patient may be discharged home with the catheter.  Risks include, but not limited to, infection, bleeding, blood clot (thrombus formation), and puncture of an artery; nerve damage and irregular heat beat.  Alternatives to this procedure were also discussed.  PICC/Midline Placement Documentation     Verbal consent obtained   Robin Schroeder 03/24/2014, 1:31 PM

## 2014-03-24 NOTE — Consult Note (Signed)
Psychiatry Consult Follow Up Note  Reason for Consult:  Depression and capacity evaluation Referring Physician:  Dr. [name] is an 58 y.o. female. Total Time spent with patient: 20 minutes  Assessment: AXIS I:  Mood Disorder NOS AXIS II:  Deferred AXIS III:   Past Medical History  Diagnosis Date  . Hypertension   . Diabetes mellitus without complication   . Arthritis   . Coronary artery disease     pt unaware   AXIS IV:  other psychosocial or environmental problems, problems related to social environment and problems with primary support group AXIS V:  51-60 moderate symptoms  Plan: Patient has capacity to make her own medical decisions and living arrangement Continue Remeron 15 mg PO Qhs and increase Zyprexa 5 mg PO Qhs for better appetite No evidence of imminent risk to self or others at present.   Patient does not meet criteria for psychiatric inpatient admission. Supportive therapy provided about ongoing stressors.  Appreciate psychiatric consultation and follow up as clinically required Please contact [phone] or [phone] if needs further assistance  Subjective:   Robin Schroeder is a 58 y.o. female patient admitted with refuses eating and drinking.  HPI:  This is a 58 year old female seen and chart reviewed. Patient has two sister's at bed side when walked into her room, patient asked them to step out for this evaluation. She lives with her husband who is not in hospital at this time. She wishes to see her brother while in hospital. Patient is seen with Sindy Messing, LCSW for psych consultation for capacity evaluation and possible suicidal ideations. Patient stated that she wants straighten up and go home. She has denied depression, anxiety, and psychosis. She has denied suicidal or homicidal ideation, intention or psychosis. She has fair insight into her medical and clinical condition and upset when family concern about her not eating and saying why she  should eat when she was no hungry and stated that she is eating every day as much as she can. \  Interval History: Patient appeared lying down on her back, awake, responds verbally but she is a poor historian. She endorses not feeling good, not sleeping and eating well. She has multiple medical problems and going to medical investigation at this time. Staff RN reported that she was taken only few sips of Ensure when offered. She is mostly closing her eyes and responds few words when talk to her. She has some what relaxed than yesterday. She has no family members at bed side today but spoke with Sindy Messing, LCSW who spoke with patient husband earlier today.   Medical history: patient with multiple medical problems who was discharged from the hospital in October after she was treated for MSSA bacteremia with sepsis. She was found to have MSSA bacteremia on 10/13, blood cultures cleared on 10/16 and she was placed on cefazolin. Patient underwent TEE on 1019 which was negative for vegetations. Patient was discharged to skilled nursing facility for IV antibiotics and rehabilitation. Patient is a very poor historian, and husband is not at bedside. Patient's sister is at bedside. So most of the history obtained from sister and ED physician. As per patient's husband, patient did not want to eat or drink and she is not eating and drinking for past 5 days and so he got concerned that she wants to hurt herself. So he filled out IVC and patient was brought by the EMS. Patient was discharged from the nursing home  about 2 weeks ago and has been nonambulatory. There has been has been providing care with feeding and diaper change when needed. Patient does have decubitus ulcers. Patient denies any chest pain complains of back pain, no nausea vomiting or diarrhea. She has temp of 103.2, and was hypotensive. Patient started on empiric antibiotics including vancomycin, Azactam and Levaquin by the ED physician.  HPI  Elements: Location:  depression. Quality:  wheel chair bound. Severity:  multiple medical problems'. Timing:  unknown stresses.  Past Psychiatric History: Past Medical History  Diagnosis Date  . Hypertension   . Diabetes mellitus without complication   . Arthritis   . Coronary artery disease     pt unaware    reports that she has never smoked. She does not have any smokeless tobacco history on file. She reports that she does not drink alcohol or use illicit drugs. Family History  Problem Relation Age of Onset  . Hypertension Brother      Living Arrangements: Spouse/significant other   Abuse/Neglect [facility]) Physical Abuse: Denies Verbal Abuse: Denies Sexual Abuse: Denies Allergies:   Allergies  Allergen Reactions  . Penicillins Rash    ACT Assessment Complete:  NO Objective: Blood pressure 92/54, pulse 94, temperature 98.5 F (36.9 C), temperature source Oral, resp. rate 18, height 5' 4"  (1.626 m), weight 90 kg (198 lb 6.6 oz), SpO2 97 %.Body mass index is 34.04 kg/(m^2). Results for orders placed or performed during the hospital encounter of 03/19/14 (from the past 72 hour(s))  Glucose, capillary     Status: Abnormal   Collection Time: 03/21/14  4:27 PM  Result Value Ref Range   Glucose-Capillary 235 (H) 70 - 99 mg/dL  Glucose, capillary     Status: Abnormal   Collection Time: 03/21/14 10:23 PM  Result Value Ref Range   Glucose-Capillary 258 (H) 70 - 99 mg/dL   Comment 1 Documented in Chart    Comment 2 Notify RN   BMET in AM     Status: Abnormal   Collection Time: 03/22/14  3:14 AM  Result Value Ref Range   Sodium 133 (L) 137 - 147 mEq/L   Potassium 4.0 3.7 - 5.3 mEq/L   Chloride 103 96 - 112 mEq/L   CO2 18 (L) 19 - 32 mEq/L   Glucose, Bld 276 (H) 70 - 99 mg/dL   BUN 24 (H) 6 - 23 mg/dL   Creatinine, Ser 0.48 (L) 0.50 - 1.10 mg/dL    Comment: DELTA CHECK NOTED REPEATED TO VERIFY    Calcium 7.4 (L) 8.4 - 10.5 mg/dL   GFR calc non Af Amer >90 >90 mL/min    GFR calc Af Amer >90 >90 mL/min    Comment: (NOTE) The eGFR has been calculated using the CKD EPI equation. This calculation has not been validated in all clinical situations. eGFR's persistently <90 mL/min signify possible Chronic Kidney Disease.    Anion gap 12 5 - 15  CBC     Status: Abnormal   Collection Time: 03/22/14  3:14 AM  Result Value Ref Range   WBC 6.7 4.0 - 10.5 K/uL   RBC 3.58 (L) 3.87 - 5.11 MIL/uL   Hemoglobin 9.7 (L) 12.0 - 15.0 g/dL   HCT 30.2 (L) 36.0 - 46.0 %   MCV 84.4 78.0 - 100.0 fL   MCH 27.1 26.0 - 34.0 pg   MCHC 32.1 30.0 - 36.0 g/dL   RDW 16.7 (H) 11.5 - 15.5 %   Platelets 113 (L) 150 - 400  K/uL    Comment: DELTA CHECK NOTED REPEATED TO VERIFY   Magnesium     Status: None   Collection Time: 03/22/14  3:14 AM  Result Value Ref Range   Magnesium 1.8 1.5 - 2.5 mg/dL  Phosphorus     Status: Abnormal   Collection Time: 03/22/14  3:14 AM  Result Value Ref Range   Phosphorus 1.9 (L) 2.3 - 4.6 mg/dL  Heparin level (unfractionated)     Status: Abnormal   Collection Time: 03/22/14  3:14 AM  Result Value Ref Range   Heparin Unfractionated <0.10 (L) 0.30 - 0.70 IU/mL    Comment:        IF HEPARIN RESULTS ARE BELOW EXPECTED VALUES, AND PATIENT DOSAGE HAS BEEN CONFIRMED, SUGGEST FOLLOW UP TESTING OF ANTITHROMBIN III LEVELS. Performed at [facility]   Glucose, capillary     Status: Abnormal   Collection Time: 03/22/14  7:43 AM  Result Value Ref Range   Glucose-Capillary 250 (H) 70 - 99 mg/dL   Comment 1 Documented in Chart    Comment 2 Notify RN   Vancomycin, trough     Status: Abnormal   Collection Time: 03/22/14 12:17 PM  Result Value Ref Range   Vancomycin Tr 32.5 (HH) 10.0 - 20.0 ug/mL    Comment: CRITICAL RESULT CALLED TO, READ BACK BY AND VERIFIED WITH: S DILLON AT 1357 ON 12.06.2015 BY NBROOKS   Heparin level (unfractionated)     Status: None   Collection Time: 03/22/14 12:17 PM  Result Value Ref Range   Heparin Unfractionated  0.47 0.30 - 0.70 IU/mL    Comment:        IF HEPARIN RESULTS ARE BELOW EXPECTED VALUES, AND PATIENT DOSAGE HAS BEEN CONFIRMED, SUGGEST FOLLOW UP TESTING OF ANTITHROMBIN III LEVELS. Performed at [facility]   Glucose, capillary     Status: Abnormal   Collection Time: 03/22/14 12:26 PM  Result Value Ref Range   Glucose-Capillary 271 (H) 70 - 99 mg/dL   Comment 1 Documented in Chart    Comment 2 Notify RN   Glucose, capillary     Status: Abnormal   Collection Time: 03/22/14  5:32 PM  Result Value Ref Range   Glucose-Capillary 253 (H) 70 - 99 mg/dL   Comment 1 Documented in Chart    Comment 2 Notify RN   Heparin level (unfractionated)     Status: None   Collection Time: 03/22/14  7:43 PM  Result Value Ref Range   Heparin Unfractionated 0.51 0.30 - 0.70 IU/mL    Comment:        IF HEPARIN RESULTS ARE BELOW EXPECTED VALUES, AND PATIENT DOSAGE HAS BEEN CONFIRMED, SUGGEST FOLLOW UP TESTING OF ANTITHROMBIN III LEVELS. Performed at [facility]   Glucose, capillary     Status: Abnormal   Collection Time: 03/22/14  8:06 PM  Result Value Ref Range   Glucose-Capillary 254 (H) 70 - 99 mg/dL  CBC     Status: Abnormal   Collection Time: 03/23/14  3:50 AM  Result Value Ref Range   WBC 7.7 4.0 - 10.5 K/uL   RBC 3.62 (L) 3.87 - 5.11 MIL/uL   Hemoglobin 9.9 (L) 12.0 - 15.0 g/dL   HCT 30.7 (L) 36.0 - 46.0 %   MCV 84.8 78.0 - 100.0 fL   MCH 27.3 26.0 - 34.0 pg   MCHC 32.2 30.0 - 36.0 g/dL   RDW 16.7 (H) 11.5 - 15.5 %   Platelets 108 (L) 150 -  400 K/uL    Comment: CONSISTENT WITH PREVIOUS RESULT  Heparin level (unfractionated)     Status: Abnormal   Collection Time: 03/23/14  3:50 AM  Result Value Ref Range   Heparin Unfractionated 0.75 (H) 0.30 - 0.70 IU/mL    Comment:        IF HEPARIN RESULTS ARE BELOW EXPECTED VALUES, AND PATIENT DOSAGE HAS BEEN CONFIRMED, SUGGEST FOLLOW UP TESTING OF ANTITHROMBIN III LEVELS. Performed at [place] metabolic  panel     Status: Abnormal   Collection Time: 03/23/14  3:50 AM  Result Value Ref Range   Sodium 132 (L) 137 - 147 mEq/L   Potassium 3.9 3.7 - 5.3 mEq/L   Chloride 101 96 - 112 mEq/L   CO2 16 (L) 19 - 32 mEq/L   Glucose, Bld 239 (H) 70 - 99 mg/dL   BUN 30 (H) 6 - 23 mg/dL   Creatinine, Ser 0.69 0.50 - 1.10 mg/dL   Calcium 7.6 (L) 8.4 - 10.5 mg/dL   GFR calc non Af Amer >90 >90 mL/min   GFR calc Af Amer >90 >90 mL/min    Comment: (NOTE) The eGFR has been calculated using the CKD EPI equation. This calculation has not been validated in all clinical situations. eGFR's persistently <90 mL/min signify possible Chronic Kidney Disease.    Anion gap 15 5 - 15  Magnesium     Status: None   Collection Time: 03/23/14  3:50 AM  Result Value Ref Range   Magnesium 1.9 1.5 - 2.5 mg/dL  Phosphorus     Status: None   Collection Time: 03/23/14  3:50 AM  Result Value Ref Range   Phosphorus 2.7 2.3 - 4.6 mg/dL  Glucose, capillary     Status: Abnormal   Collection Time: 03/23/14  4:19 AM  Result Value Ref Range   Glucose-Capillary 234 (H) 70 - 99 mg/dL  Glucose, capillary     Status: Abnormal   Collection Time: 03/23/14  7:46 AM  Result Value Ref Range   Glucose-Capillary 232 (H) 70 - 99 mg/dL   Comment 1 Documented in Chart    Comment 2 Notify RN   Glucose, capillary     Status: Abnormal   Collection Time: 03/23/14 12:54 PM  Result Value Ref Range   Glucose-Capillary 221 (H) 70 - 99 mg/dL   Comment 1 Documented in Chart    Comment 2 Notify RN   Glucose, capillary     Status: Abnormal   Collection Time: 03/23/14  4:28 PM  Result Value Ref Range   Glucose-Capillary 188 (H) 70 - 99 mg/dL   Comment 1 Documented in Chart    Comment 2 Notify RN   Glucose, capillary     Status: None   Collection Time: 03/23/14  6:15 PM  Result Value Ref Range   Glucose-Capillary 95 70 - 99 mg/dL   Comment 1 Repeat Test   Glucose, capillary     Status: Abnormal   Collection Time: 03/23/14  9:45 PM   Result Value Ref Range   Glucose-Capillary 120 (H) 70 - 99 mg/dL  CBC     Status: Abnormal   Collection Time: 03/24/14  3:35 AM  Result Value Ref Range   WBC 3.3 (L) 4.0 - 10.5 K/uL   RBC 3.32 (L) 3.87 - 5.11 MIL/uL   Hemoglobin 8.9 (L) 12.0 - 15.0 g/dL   HCT 27.3 (L) 36.0 - 46.0 %   MCV 82.2 78.0 - 100.0 fL  MCH 26.8 26.0 - 34.0 pg   MCHC 32.6 30.0 - 36.0 g/dL   RDW 16.7 (H) 11.5 - 15.5 %   Platelets 91 (L) 150 - 400 K/uL    Comment: CONSISTENT WITH PREVIOUS RESULT REPEATED TO VERIFY   Troponin I     Status: Abnormal   Collection Time: 03/24/14  3:35 AM  Result Value Ref Range   Troponin I 2.28 (HH) <0.30 ng/mL    Comment:        Due to the release kinetics of cTnI, a negative result within the first hours of the onset of symptoms does not rule out myocardial infarction with certainty. If myocardial infarction is still suspected, repeat the test at appropriate intervals. CRITICAL RESULT CALLED TO, READ BACK BY AND VERIFIED WITH: M.REEVES,RN AT 0415 ON 03/24/14 BY SHEA.W   Basic metabolic panel     Status: Abnormal   Collection Time: 03/24/14  3:35 AM  Result Value Ref Range   Sodium 132 (L) 137 - 147 mEq/L   Potassium 3.8 3.7 - 5.3 mEq/L   Chloride 101 96 - 112 mEq/L   CO2 18 (L) 19 - 32 mEq/L   Glucose, Bld 123 (H) 70 - 99 mg/dL   BUN 38 (H) 6 - 23 mg/dL   Creatinine, Ser 0.97 0.50 - 1.10 mg/dL   Calcium 7.7 (L) 8.4 - 10.5 mg/dL   GFR calc non Af Amer 62 (L) >90 mL/min   GFR calc Af Amer 72 (L) >90 mL/min    Comment: (NOTE) The eGFR has been calculated using the CKD EPI equation. This calculation has not been validated in all clinical situations. eGFR's persistently <90 mL/min signify possible Chronic Kidney Disease.    Anion gap 13 5 - 15  Glucose, capillary     Status: Abnormal   Collection Time: 03/24/14  7:56 AM  Result Value Ref Range   Glucose-Capillary 116 (H) 70 - 99 mg/dL   Comment 1 Documented in Chart    Comment 2 Notify RN   Glucose, capillary      Status: Abnormal   Collection Time: 03/24/14 12:03 PM  Result Value Ref Range   Glucose-Capillary 159 (H) 70 - 99 mg/dL   Comment 1 Documented in Chart    Comment 2 Notify RN    Labs are reviewed.  Current Facility-Administered Medications  Medication Dose Route Frequency Provider Last Rate Last Dose  . 0.9 %  sodium chloride infusion   Intravenous Continuous Reyne Dumas, MD 75 mL/hr at 03/24/14 1509    . acetaminophen (TYLENOL) tablet 650 mg  650 mg Oral Q6H PRN Oswald Hillock, MD   650 mg at 03/22/14 2207   Or  . acetaminophen (TYLENOL) suppository 650 mg  650 mg Rectal Q6H PRN Oswald Hillock, MD      . antiseptic oral rinse (CPC / CETYLPYRIDINIUM CHLORIDE 0.05%) solution 7 mL  7 mL Mouth Rinse q12n4p Modena Jansky, MD   7 mL at 03/24/14 1510  . aspirin chewable tablet 81 mg  81 mg Oral Daily Pixie Casino, MD   81 mg at 03/24/14 1026  . atorvastatin (LIPITOR) tablet 20 mg  20 mg Oral q1800 Dorothy Spark, MD   20 mg at 03/23/14 1700  . ceFAZolin (ANCEF) IVPB 2 g/50 mL premix  2 g Intravenous Q8H Mosetta Pigeon, RPH   2 g at 03/24/14 1213  . chlorhexidine (PERIDEX) 0.12 % solution 15 mL  15 mL Mouth Rinse BID Modena Jansky, MD  15 mL at 03/24/14 0749  . chlorpheniramine-HYDROcodone (TUSSIONEX) 10-8 MG/5ML suspension 5 mL  5 mL Oral Q12H PRN Shanda Howells, MD   5 mL at 03/22/14 0133  . collagenase (SANTYL) ointment   Topical Daily Modena Jansky, MD      . enoxaparin (LOVENOX) injection 40 mg  40 mg Subcutaneous Q24H Carlena Bjornstad, MD   40 mg at 03/24/14 1218  . feeding supplement (GLUCERNA SHAKE) (GLUCERNA SHAKE) liquid 237 mL  237 mL Oral TID BM Hazle Coca, RD   237 mL at 03/24/14 1029  . fludrocortisone (FLORINEF) tablet 0.1 mg  0.1 mg Oral Daily Raylene Miyamoto, MD   0.1 mg at 03/24/14 1029  . furosemide (LASIX) injection 40 mg  40 mg Intravenous BID Fay Records, MD   40 mg at 03/24/14 1037  . hydrocortisone sodium succinate (SOLU-CORTEF) 100 MG injection 50  mg  50 mg Intravenous Q12H Kara Mead V, MD   50 mg at 03/24/14 1551  . HYDROmorphone (DILAUDID) injection 0.5-1 mg  0.5-1 mg Intravenous Q4H PRN Reyne Dumas, MD   1 mg at 03/24/14 1145  . insulin aspart (novoLOG) injection 0-5 Units  0-5 Units Subcutaneous QHS Modena Jansky, MD   3 Units at 03/22/14 2206  . insulin aspart (novoLOG) injection 0-9 Units  0-9 Units Subcutaneous TID WC Modena Jansky, MD   2 Units at 03/24/14 1211  . insulin glargine (LANTUS) injection 15 Units  15 Units Subcutaneous Daily Rigoberto Noel, MD   15 Units at 03/24/14 1210  . mirtazapine (REMERON SOL-TAB) disintegrating tablet 15 mg  15 mg Oral QHS Durward Parcel, MD   15 mg at 03/23/14 2225  . OLANZapine zydis (ZYPREXA) disintegrating tablet 2.5 mg  2.5 mg Oral QHS Durward Parcel, MD   2.5 mg at 03/23/14 2200  . ondansetron (ZOFRAN) tablet 4 mg  4 mg Oral Q6H PRN Oswald Hillock, MD   4 mg at 03/21/14 1352   Or  . ondansetron (ZOFRAN) injection 4 mg  4 mg Intravenous Q6H PRN Oswald Hillock, MD      . phenylephrine (NEO-SYNEPHRINE) 10 mg in dextrose 5 % 250 mL (0.04 mg/mL) infusion  30-200 mcg/min Intravenous Titrated Raylene Miyamoto, MD   Stopped at 03/23/14 0800  . sodium bicarbonate tablet 650 mg  650 mg Oral TID Modena Jansky, MD   650 mg at 03/24/14 1550  . sodium chloride 0.9 % injection 10-40 mL  10-40 mL Intracatheter Q12H Reyne Dumas, MD   10 mL at 03/24/14 1501  . sodium chloride 0.9 % injection 10-40 mL  10-40 mL Intracatheter PRN Reyne Dumas, MD        Psychiatric Specialty Exam: Physical Exam as per history and physical  ROS decrease appetite   Blood pressure 92/54, pulse 94, temperature 98.5 F (36.9 C), temperature source Oral, resp. rate 18, height 5' 4"  (1.626 m), weight 90 kg (198 lb 6.6 oz), SpO2 97 %.Body mass index is 34.04 kg/(m^2).  General Appearance: Guarded  Eye Contact::  Good  Speech:  Clear and Coherent and Slow  Volume:  Decreased  Mood:  Anxious and  Depressed  Affect:  Constricted and Depressed  Thought Process:  Coherent and Goal Directed  Orientation:  Full (Time, Place, and Person)  Thought Content:  WDL  Suicidal Thoughts:  No  Homicidal Thoughts:  No  Memory:  Immediate;   Fair Recent;   Fair  Judgement:  Intact  Insight:  Fair  Psychomotor Activity:  Decreased  Concentration:  Fair  Recall:  [place] of Knowledge:Good  Language: Good  Akathisia:  NA  Handed:  Right  AIMS (if indicated):     Assets:  Communication Skills Desire for Improvement Financial Resources/Insurance Housing Intimacy Leisure Time Resilience Social Support  Sleep:      Musculoskeletal: Strength & Muscle Tone: decreased Gait & Station: unable to stand Patient leans: N/A  Treatment Plan Summary: Daily contact with patient to assess and evaluate symptoms and progress in treatment Medication management  Pending TSH and free T4 levels Patient has capacity to make her own medical decisions and living arrangement Discontinue IVC and sitter as she has no safety concerns at this time Continuese Remeron 15 mg PO Qhs and increase Zyprexa 5 mg PO Qhs for better appetite  Vernal Rutan,JANARDHAHA R. 03/24/2014 4:09 PM

## 2014-03-24 NOTE — Progress Notes (Signed)
CRITICAL VALUE ALERT  Critical value received:  Troponin  Date of notification:  03/24/2014  Time of notification:  0430  Critical value read back yes  Nurse who received alert:  Jairo Ben RN  MD notified (1st page):[facility]  On-call  Time of first page: 0445  MD notified (2nd page): [facility]  Time of second page: [phone]  Responding MD: [facility]  Time MD responded: [phone]

## 2014-03-24 NOTE — Progress Notes (Addendum)
Subjective: Denies CP  Breathing is OK   Objective: Filed Vitals:   03/24/14 0400 03/24/14 0500 03/24/14 0600 03/24/14 0700  BP: 117/57 119/50 102/57 118/53  Pulse: 92 74 81 82  Temp: 97.4 F (36.3 C)     TempSrc: Oral     Resp: 19 16 16 19   Height:      Weight: 198 lb 6.6 oz (90 kg)     SpO2: 94% 97% 95% 95%   Weight change: 5 lb 4.7 oz (2.4 kg)  Intake/Output Summary (Last 24 hours) at 03/24/14 0737 Last data filed at 03/23/14 2227  Gross per 24 hour  Intake 185.83 ml  Output    195 ml  Net  -9.17 ml    General: Alert, awake, in no acute distress  Heart: Regular rate and rhythm, without murmurs, rubs, gallops.  Lungs: Clear to auscultation.anteiorly  No rales or wheezes. Exemities:  2+ edema UE and LE L foot in boot   Neuro: Grossly intact, nonfocal.  TeleSR Lab Results: Results for orders placed or performed during the hospital encounter of 03/19/14 (from the past 24 hour(s))  Glucose, capillary     Status: Abnormal   Collection Time: 03/23/14  7:46 AM  Result Value Ref Range   Glucose-Capillary 232 (H) 70 - 99 mg/dL   Comment 1 Documented in Chart    Comment 2 Notify RN   Glucose, capillary     Status: Abnormal   Collection Time: 03/23/14 12:54 PM  Result Value Ref Range   Glucose-Capillary 221 (H) 70 - 99 mg/dL   Comment 1 Documented in Chart    Comment 2 Notify RN   Glucose, capillary     Status: Abnormal   Collection Time: 03/23/14  4:28 PM  Result Value Ref Range   Glucose-Capillary 188 (H) 70 - 99 mg/dL   Comment 1 Documented in Chart    Comment 2 Notify RN   Glucose, capillary     Status: Abnormal   Collection Time: 03/23/14  9:45 PM  Result Value Ref Range   Glucose-Capillary 120 (H) 70 - 99 mg/dL  CBC     Status: Abnormal   Collection Time: 03/24/14  3:35 AM  Result Value Ref Range   WBC 3.3 (L) 4.0 - 10.5 K/uL   RBC 3.32 (L) 3.87 - 5.11 MIL/uL   Hemoglobin 8.9 (L) 12.0 - 15.0 g/dL   HCT 14/08/15 (L) 10.9 - 32.3 %   MCV 82.2 78.0 - 100.0 fL     MCH 26.8 26.0 - 34.0 pg   MCHC 32.6 30.0 - 36.0 g/dL   RDW 55.7 (H) 32.2 - 02.5 %   Platelets 91 (L) 150 - 400 K/uL  Troponin I     Status: Abnormal   Collection Time: 03/24/14  3:35 AM  Result Value Ref Range   Troponin I 2.28 (HH) <0.30 ng/mL  Basic metabolic panel     Status: Abnormal   Collection Time: 03/24/14  3:35 AM  Result Value Ref Range   Sodium 132 (L) 137 - 147 mEq/L   Potassium 3.8 3.7 - 5.3 mEq/L   Chloride 101 96 - 112 mEq/L   CO2 18 (L) 19 - 32 mEq/L   Glucose, Bld 123 (H) 70 - 99 mg/dL   BUN 38 (H) 6 - 23 mg/dL   Creatinine, Ser 14/08/15 0.50 - 1.10 mg/dL   Calcium 7.7 (L) 8.4 - 10.5 mg/dL   GFR calc non Af Amer 62 (L) >90 mL/min   GFR  calc Af Amer 72 (L) >90 mL/min   Anion gap 13 5 - 15    Studies/Results: Dg Chest Port 1 View  03/23/2014   CLINICAL DATA:  Pulmonary edema. Hypertension. Diabetes. Coronary artery disease.  EXAM: PORTABLE CHEST - 1 VIEW  COMPARISON:  03/21/2014  FINDINGS: Mild worsening of infiltrate seen in the left retrocardiac lung base. Mild opacity also seen in the medial right lung base is suspicious for developing infiltrate. Tiny left pleural effusion cannot be excluded Heart size remains within normal limits.  IMPRESSION: Increased infiltrate in the left retrocardiac lung base, and probably the medial right lung base as well.   Electronically Signed   By: Myles Rosenthal M.D.   On: 03/23/2014 07:09    Medications: Reviewed   @PROBHOSP @  1.  STEMI  Trop continue to trend down  Echo still pending  Prob delayed due to merge problem  WIll confirm with echo dept She is currently without pain.  Volume overloaded  WOuld diurese  2  Sepsis  ID recomm MRI of LE   With this requiring conscious sedation would like to get LVEF prior  Would diurese befor possible TEE       LOS: 5 days   03/24/2014, 7:37 AM

## 2014-03-24 NOTE — Progress Notes (Signed)
Echo report shows LVEF 50 to 55% with distal inferior and apical hypokinesis that is new.   Note plans for MRI  WIll need to be followed closely with close monitoring of BP/O2 with sedation.

## 2014-03-24 NOTE — Progress Notes (Signed)
NUTRITION FOLLOW-UP  INTERVENTION: -Recommend use of enteral nutrition vs TPN as pt with functioning gut and not ideal candidate for parenteral nutrition; discussed options with pt's husband. Husband to discuss with patient. Decision pending -Pending decision, recommend:  -Initiate Jevity 1.2 at 20 ml/hr via TF access and advance 10 ml every 8 hours (high refeeding risk) to goal rate of 65 ml/hr to provide 1872 kcal (100% est kcal needs), 86 gram protein (86% est protein needs) and 1259 ml free H2O -Modified supplement to Ensure Complete po TID, each supplement provides 350 kcal and 13 grams of protein -Encouraged supplement and protein foods  -RD to continue to monitor  NUTRITION DIAGNOSIS: Inadequate oral intake related to decreased appetite/refusal of meals as evidenced by PO intake < 75% > one month, progressing.  Goal: Pt to meet >/= 90% of their estimated nutrition needs, unmet    Monitor:  Total protein/energy intake, labs, weights, supplement acceptance, skin integrity   Admitting Dx: Septic shock secondary to staph bacteremia  ASSESSMENT: 58 year old female with multiple medical problems who was discharged from the hospital in October after she was treated for MSSA bacteremia with sepsis.  As per patient's husband, patient did not want to eat or drink and she is not eating and drinking for past 5 days and so he got concerned that she wants to hurt herself. So he filled out IVC and patient was brought by the EMS.  Pt reports eating better today, NT agreed. Pt is taking small sips and bites of meals, PO improved. Pt states that she likes the Glucerna shakes and was drinking one during visit.  RD encouraged pt to eat as much as tolerated to promote wound healing.   Labs reviewed: Low Na, Creatinine & Phos Elevated BUN Glucose 276  12/08: -Pt refusing meals and supplements -Pharmacy consulted for initiation of TPN -Pt not candidate for TPN d/t functioning gut; discussed  nutrition support options with family. Recommended use of TF vs TPN; however it is likely pt will refuse TF access. Husband verbalized understanding and planned on discussing with pt.  Encouraged nutrition to assist with anasarca, skin integrity and improve deconditioning -Discussed options with RN. RN in agreement, however also concerned with pt's acceptance of NG/NJ tube -Followed up with pt; however pt's husband not in room -Attempted to contact husband; however was unable to reach. Left voicemail. -RD to follow up tomorrow for decision. Pharmacy in agreement to hold of TPN until decision is made by family -Modified supplement to Ensure as husband believed that she might be more agreeable to different flavors  Height: Ht Readings from Last 1 Encounters:  03/19/14 5\' 4"  (1.626 m)    Weight: Wt Readings from Last 1 Encounters:  03/24/14 198 lb 6.6 oz (90 kg)   BMI:  Body mass index is 34.04 kg/(m^2).  Estimated Nutritional Needs: Kcal: 1800-2000 Protein: 100-110 gram Fluid: per MD  Skin: stg 2 pressure ulcer on heel and arm, deep tissue injury on heel, +4 generalized edema  Diet Order: Diet Carb Modified  EDUCATION NEEDS: -No education needs identified at this time   Intake/Output Summary (Last 24 hours) at 03/24/14 1612 Last data filed at 03/24/14 1509  Gross per 24 hour  Intake 315.33 ml  Output    545 ml  Net -229.67 ml    Last BM: 12/5  Labs:   Recent Labs Lab 03/20/14 0820  03/22/14 0314 03/23/14 0350 03/24/14 0335  NA  --   < > 133* 132* 132*  K  --   < >  4.0 3.9 3.8  CL  --   < > 103 101 101  CO2  --   < > 18* 16* 18*  BUN  --   < > 24* 30* 38*  CREATININE  --   < > 0.48* 0.69 0.97  CALCIUM  --   < > 7.4* 7.6* 7.7*  MG 1.6  --  1.8 1.9  --   PHOS  --   --  1.9* 2.7  --   GLUCOSE  --   < > 276* 239* 123*  < > = values in this interval not displayed.  CBG (last 3)   Recent Labs  03/23/14 2145 03/24/14 0756 03/24/14 1203  GLUCAP 120* 116* 159*     Scheduled Meds: . antiseptic oral rinse  7 mL Mouth Rinse q12n4p  . aspirin  81 mg Oral Daily  . atorvastatin  20 mg Oral q1800  .  ceFAZolin (ANCEF) IV  2 g Intravenous Q8H  . chlorhexidine  15 mL Mouth Rinse BID  . collagenase   Topical Daily  . enoxaparin (LOVENOX) injection  40 mg Subcutaneous Q24H  . feeding supplement (GLUCERNA SHAKE)  237 mL Oral TID BM  . fludrocortisone  0.1 mg Oral Daily  . furosemide  40 mg Intravenous BID  . hydrocortisone sod succinate (SOLU-CORTEF) inj  50 mg Intravenous Q12H  . insulin aspart  0-5 Units Subcutaneous QHS  . insulin aspart  0-9 Units Subcutaneous TID WC  . insulin glargine  15 Units Subcutaneous Daily  . mirtazapine  15 mg Oral QHS  . OLANZapine zydis  2.5 mg Oral QHS  . sodium bicarbonate  650 mg Oral TID  . sodium chloride  10-40 mL Intracatheter Q12H    Continuous Infusions: . sodium chloride 75 mL/hr at 03/24/14 1509  . phenylephrine (NEO-SYNEPHRINE) Adult infusion Stopped (03/23/14 0800)    Lloyd Huger MS RD LDN Clinical Dietitian Pager:[phone]

## 2014-03-24 NOTE — Progress Notes (Addendum)
PROGRESS NOTE    Robin Schroeder VPX:[id number] DOB: Jan 06, 1956 DOA: 03/19/2014 PCP: No PCP Per Patient  HPI/Brief narrative 58 year old female patient with history of hypertension, obesity, DM, chronic diastolic CHF, possible rheumatoid arthritis, recent hospitalization for MSSA bacteremia of unknown source/neg TEE- cleared after Vancomycin (PCN allergy), went to SNF and then discharged from their to home, presented with complaints of not eating or drinking 4 days and husband was concerned that patient was trying to hurt herself and she was IVC and brought to ED where she was assessed as sepsis of unclear source and admitted to stepdown for further management. Patient went into septic shock requiring pressors. She also developed STEMI but poor overall candidate for aggressive intervention i.e. Currently. CCM, ID and cardiology assisting with care. Shock resolved- pressors DC'ed 12/7 AM.   Assessment/Plan:  1. Septic shock secondary to MSSA bacteremia: Patient had been empirically started on IV vancomycin and aztreonam. Source not clear-? Osteomyelitis of medial left knee on x-ray. Infectious disease follow-up appreciated. Antibiotics narrowed to IV Ancef. Shock improved/resolved and DC'd pressors 12/7 AM. Continue stress dose IV hydrocortisone (tapering dose) and Fludrocortisone. As per ID, will eventually need repeat TEE when medically stable. Obtain MRI of left knee,foot ,ankle .Needs conscious sedation, not stable for MRI per cardiology . Surveillance blood cultures 2 (12/4): Negative to date.OK to put a PICC line  Per ID .ID recommends B/L arthrocentesis  2. ? Osteomyelitis of medial left knee on x-ray: ID follow-up appreciated. MRI of left knee when stable. Orthopedic consultation not called yet, Discussed with ID , will request IR to tap both knees . 3. Left lower lobe airspace disease: Possibly chronic.? Pneumonia. ID and pulmonary to weigh in the need for broadening antibiotic coverage. In  the absence of respiratory symptoms, felt less likely.  4. Dehydration with hyponatremia: Resolved.  5. Inferiolateral wall STEMI: Patient has not complained of chest pain since admission. Troponins steadily increased to >20 on 12/3. Not candidate for aggressive intervention i.e. cath secondary to unstable status from shock, anemia and thrombocytopenia. Patient treated with 48 hours of IV heparin-DC'd 12/7 and aspirin-tolerating. Will eventually need cardiac catheterization when stable. Follow-up 2-D echo-cardiologists unable to read secondary to systems being down for the last few days. Dr Tenny Craw will follow up on ECHo,  Cards will clear her for MRI as conscious sedation is needed . 6. IVC/?? Intention to self harm: IVC removed . Psychiatry consultation note reviewed . Discussed with Dr. Tenny Craw 12/5. As per nursing report 12/7, while turning patient-she expressed that she wanted to die and nursing concerned regarding suicidal ideations although patient repeatedly denies. Meds adjusted by psyche  7. Anasarca: Secondary to ongoing illnesses, malnutrition and hypo-albuminemia. Treat underlying cause and attempt to improve nutritional status. Diuretics to be started if blood pressures remain consistently stable.Start TPN to improve nutritional status . 8. Right sacral decubitus: Does not appear grossly infected. Wound care consultation requested. 9. Hypertension: Patient in septic shock. Management as above. 10. Type II DM: Uncontrolled. Likely worsened by steroids. Added low-dose Lantus-we'll titrate up and continue SSI. 11. Possible rheumatoid arthritis: Patient was on prednisone at home. Currently on stress dose IV hydrocortisone-tapering down and eventually start back prednisone. RA related pain was probably the reason she had been bed bound since recent DC from SNF. Recommend an OP Rheumatology consultation appointment be set up for her prior to DC- this can be done when she is stable and approaching  DC. 12. Chronic diastolic CHF: Seems compensated. 13. Hypokalemia:  Replaced. 14. Anemia: Acute on chronic Unclear etiology. Baseline hemoglobin probably in the 9 g per DL range. Status post 1 unit PRBC 12/4 with appropriate response. Hemoglobin stable/improving posttransfusion. 15. Thrombocytopenia: Possibly from acute illness/sepsis. Continues to improve. Follow CBCs. 16. Multiple pressure wounds: Details as per WOC nurse note on 12/4. Patient has left heel DTI, right arm full-thickness abrasion and unstageable right buttock wound. 17. Failure to thrive: Multifactorial 18. Severe deconditioning: PT and OT evaluation unable. 19. Severe protein calorie malnutrition: Dietitian consulted. 20. Depressive disorder secondary to general medical condition: Management per psychiatry. zyprexa  added. Requested psychiatry to reassess.  21. Oliguria: Likely secondary to shock. Patient did diurese with a dose of IV Lasix on 11/6 night. Consider Lasix if blood pressure remains stable. Creatinine normal. 22. E Coli UTI: Continue Ancef- sensitive. 23. Mild non-anion gap metabolic acidosis: Unclear etiology. Start by mouth bicarbonate. Follow BMP in a.m.    Code Status: Limited code Family Communication: discussed with spouse in detail on 03/23/14. Disposition Plan: Remains in ICU. Not medically stable for discharge.   Consultants:  Cardiology  ID  Psychiatry  PCCM-Signed off  Procedures:  Arterial line-discontinued  Foley catheter  Antibiotics:  IV Vanc 12/3>DC'd   IV Aztreonam 12/3> 12/4   IV Ancef.  Subjective: Denies complaints. Denies suicidal ideations-however nurses concerned regarding patient's statements. Complains of pain in both legs but no specific location. Denies dyspnea or chest pain.    Objective: Filed Vitals:   03/24/14 0500 03/24/14 0600 03/24/14 0700 03/24/14 0750  BP: 119/50 102/57 118/53 126/69  Pulse: 74 81 82 96  Temp:      TempSrc:      Resp: Height:      Weight:      SpO2: 97% 95% 95% 96%   blood pressures actually better than listed bowel-as discussed with nursing systolic in the 100s and map 73   Intake/Output Summary (Last 24 hours) at 03/24/14 0857 Last data filed at 03/24/14 0800  Gross per 24 hour  Intake 273.83 ml  Output    445 ml  Net -171.17 ml   Filed Weights   03/22/14 0437 03/23/14 0500 03/24/14 0400  Weight: 88.1 kg (194 lb 3.6 oz) 87.6 kg (193 lb 2 oz) 90 kg (198 lb 6.6 oz)     Exam:  General exam: Pleasant middle-aged female, moderately built and obese lying comfortably supine in bed. Respiratory system: Slightly diminished breath sounds in the bases but otherwise clear to auscultation. No increased work of breathing. Cardiovascular system: S1 & S2 heard, RRR. No JVD, murmurs, gallops, clicks. Anasarca. Telemetry: SB in the 50s-SR. Gastrointestinal system: Abdomen is nondistended, soft and nontender. Normal bowel sounds heard. Patient has patchy ecchymosis over right upper quadrant. Central nervous system: Alert and oriented 3. No focal neurological deficits. Extremities: Symmetric 5 x 5 power. Left knewithout acute findings today. Skin: Dry appearing right sacral decubitus without signs of overt infection. Patient has skin tear over the right posterior upper arm. Left heel DTI without signs of acute infection. Psychiatry: Pleasant and comfortable today. Denies suicidal ideations.   Data Reviewed: Basic Metabolic Panel:  Recent Labs Lab 03/20/14 0108 03/20/14 0820 03/21/14 0357 03/22/14 0314 03/23/14 0350 03/24/14 0335  NA 129*  --  135* 133* 132* 132*  K 3.2*  --  3.7 4.0 3.9 3.8  CL 98  --  105 103 101 101  CO2 22  --  21 18* 16* 18*  GLUCOSE 150*  --  218* 276* 239* 123*  BUN 17  --  15 24* 30* 38*  CREATININE 0.29*  --  0.25* 0.48* 0.69 0.97  CALCIUM 6.5*  --  7.4* 7.4* 7.6* 7.7*  MG  --  1.6  --  1.8 1.9  --   PHOS  --   --   --  1.9* 2.7  --    Liver Function Tests:  Recent  Labs Lab 03/19/14 1508 03/20/14 0108 03/21/14 0357  AST 46* 60* 72*  ALT 12 11 15   ALKPHOS 219* 157* 203*  BILITOT 1.6* 1.0 1.0  PROT 6.6 4.9* 5.4*  ALBUMIN 1.2* 0.9* 1.0*   No results for input(s): LIPASE, AMYLASE in the last 168 hours. No results for input(s): AMMONIA in the last 168 hours. CBC:  Recent Labs Lab 03/19/14 1508  03/20/14 0820 03/21/14 0357 03/22/14 0314 03/23/14 0350 03/24/14 0335  WBC 4.2  < > 4.0 5.4 6.7 7.7 3.3*  NEUTROABS 3.8  --   --  4.4  --   --   --   HGB 7.9*  < > 8.8* 8.7* 9.7* 9.9* 8.9*  HCT 24.7*  < > 27.1* 26.9* 30.2* 30.7* 27.3*  MCV 84.3  < > 83.9 84.3 84.4 84.8 82.2  PLT 77*  < > 63* 81* 113* 108* 91*  < > = values in this interval not displayed. Cardiac Enzymes:  Recent Labs Lab 03/19/14 1944 03/20/14 0108 03/20/14 0820 03/24/14 0335  TROPONINI 0.82* 7.66* >20.00* 2.28*   BNP (last 3 results) No results for input(s): PROBNP in the last 8760 hours. CBG:  Recent Labs Lab 03/23/14 0746 03/23/14 1254 03/23/14 1628 03/23/14 2145 03/24/14 0756  GLUCAP 232* 221* 188* 120* 116*    Recent Results (from the past 240 hour(s))  Urine culture     Status: None   Collection Time: 03/19/14  3:04 PM  Result Value Ref Range Status   Specimen Description URINE, CATHETERIZED  Final   Special Requests NONE  Final   Culture  Setup Time   Final    03/20/2014 00:29 Performed at 14/07/2013 Count   Final    >=100,000 COLONIES/ML Performed at [facility]    Culture   Final    ESCHERICHIA COLI Note: Two isolates with different morphologies were identified as the same organism.The most resistant organism was reported. Performed at [facility]    Report Status 03/23/2014 FINAL  Final   Organism ID, Bacteria ESCHERICHIA COLI  Final      Susceptibility   Escherichia coli - MIC*    AMPICILLIN >=32 RESISTANT Resistant     CEFAZOLIN <=4 SENSITIVE Sensitive     CEFTRIAXONE <=1 SENSITIVE Sensitive      CIPROFLOXACIN <=0.25 SENSITIVE Sensitive     GENTAMICIN <=1 SENSITIVE Sensitive     LEVOFLOXACIN <=0.12 SENSITIVE Sensitive     NITROFURANTOIN <=16 SENSITIVE Sensitive     TOBRAMYCIN <=1 SENSITIVE Sensitive     TRIMETH/SULFA >=320 RESISTANT Resistant     PIP/TAZO <=4 SENSITIVE Sensitive     * ESCHERICHIA COLI  Blood Culture (routine x 2)     Status: None   Collection Time: 03/19/14  3:23 PM  Result Value Ref Range Status   Specimen Description BLOOD RIGHT WRIST  Final   Special Requests BOTTLES DRAWN AEROBIC AND ANAEROBIC 5 ML  Final   Culture  Setup Time   Final    03/20/2014 07:48 Performed at 14/07/2013  Final    STAPHYLOCOCCUS AUREUS Note: RIFAMPIN AND GENTAMICIN SHOULD NOT BE USED AS SINGLE DRUGS FOR TREATMENT OF STAPH INFECTIONS. Note: Gram Stain Report Called to,Read Back By and Verified With: FAYE S BY INGRAM A 03/20/14 11AM Performed at [facility]    Report Status 03/22/2014 FINAL  Final   Organism ID, Bacteria STAPHYLOCOCCUS AUREUS  Final      Susceptibility   Staphylococcus aureus - MIC*    CLINDAMYCIN <=0.25 SENSITIVE Sensitive     ERYTHROMYCIN <=0.25 SENSITIVE Sensitive     GENTAMICIN <=0.5 SENSITIVE Sensitive     LEVOFLOXACIN <=0.12 SENSITIVE Sensitive     OXACILLIN 0.5 SENSITIVE Sensitive     PENICILLIN RESISTANT      RIFAMPIN <=0.5 SENSITIVE Sensitive     TRIMETH/SULFA <=10 SENSITIVE Sensitive     VANCOMYCIN <=0.5 SENSITIVE Sensitive     TETRACYCLINE <=1 SENSITIVE Sensitive     MOXIFLOXACIN <=0.25 SENSITIVE Sensitive     * STAPHYLOCOCCUS AUREUS  Blood Culture (routine x 2)     Status: None   Collection Time: 03/19/14  3:23 PM  Result Value Ref Range Status   Specimen Description BLOOD BLOOD LEFT FOREARM  Final   Special Requests BOTTLES DRAWN AEROBIC AND ANAEROBIC 5 ML  Final   Culture  Setup Time   Final    03/20/2014 07:51 Performed at [facility]    Culture   Final    STAPHYLOCOCCUS AUREUS Note:  SUSCEPTIBILITIES PERFORMED ON PREVIOUS CULTURE WITHIN THE LAST 5 DAYS. Note: Gram Stain Report Called to,Read Back By and Verified With: FAYE S BY INGRAM A 03/20/14 11AM Performed at [facility]    Report Status 03/22/2014 FINAL  Final  Culture, blood (routine x 2)     Status: None (Preliminary result)   Collection Time: 03/20/14  5:30 PM  Result Value Ref Range Status   Specimen Description BLOOD RIGHT HAND  Final   Special Requests BOTTLES DRAWN AEROBIC ONLY 8CC  Final   Culture  Setup Time   Final    03/21/2014 01:05 Performed at [facility]    Culture   Final           BLOOD CULTURE RECEIVED NO GROWTH TO DATE CULTURE WILL BE HELD FOR 5 DAYS BEFORE ISSUING A FINAL NEGATIVE REPORT Performed at [facility]    Report Status PENDING  Incomplete  Culture, blood (routine x 2)     Status: None (Preliminary result)   Collection Time: 03/20/14  5:37 PM  Result Value Ref Range Status   Specimen Description BLOOD LEFT ARM  Final   Special Requests   Final    BOTTLES DRAWN AEROBIC ONLY BLUE BOTTLE 9 CC, RED BOTTLE 1 CC   Culture  Setup Time   Final    03/21/2014 01:06 Performed at [facility]    Culture   Final           BLOOD CULTURE RECEIVED NO GROWTH TO DATE CULTURE WILL BE HELD FOR 5 DAYS BEFORE ISSUING A FINAL NEGATIVE REPORT Performed at [facility]    Report Status PENDING  Incomplete         Studies: Dg Chest Port 1 View  03/23/2014   CLINICAL DATA:  Pulmonary edema. Hypertension. Diabetes. Coronary artery disease.  EXAM: PORTABLE CHEST - 1 VIEW  COMPARISON:  03/21/2014  FINDINGS: Mild worsening of infiltrate seen in the left retrocardiac lung base. Mild opacity also seen in the medial right lung base is  suspicious for developing infiltrate. Tiny left pleural effusion cannot be excluded Heart size remains within normal limits.  IMPRESSION: Increased infiltrate in the left retrocardiac lung base, and probably the medial right lung  base as well.   Electronically Signed   By: Myles Rosenthal M.D.   On: 03/23/2014 07:09        Scheduled Meds: . antiseptic oral rinse  7 mL Mouth Rinse q12n4p  . aspirin  81 mg Oral Daily  . atorvastatin  20 mg Oral q1800  .  ceFAZolin (ANCEF) IV  2 g Intravenous Q8H  . chlorhexidine  15 mL Mouth Rinse BID  . collagenase   Topical Daily  . enoxaparin (LOVENOX) injection  40 mg Subcutaneous Q24H  . feeding supplement (GLUCERNA SHAKE)  237 mL Oral TID BM  . fludrocortisone  0.1 mg Oral Daily  . hydrocortisone sod succinate (SOLU-CORTEF) inj  50 mg Intravenous Q12H  . insulin aspart  0-5 Units Subcutaneous QHS  . insulin aspart  0-9 Units Subcutaneous TID WC  . insulin glargine  15 Units Subcutaneous Daily  . mirtazapine  15 mg Oral QHS  . OLANZapine zydis  2.5 mg Oral QHS  . sodium bicarbonate  650 mg Oral TID   Continuous Infusions: . sodium chloride    . phenylephrine (NEO-SYNEPHRINE) Adult infusion Stopped (03/23/14 0800)    Active Problems:   DM2 (diabetes mellitus, type 2)   Physical deconditioning   Hypoalbuminemia   Hyponatremia   Severe sepsis   Acute UTI   Protein-calorie malnutrition, severe   Sacral decubitus ulcer   Acute pulmonary edema   Effusion into joint   ST elevation myocardial infarction (STEMI) of inferolateral wall, initial episode of care   Severe sepsis with septic shock   Diabetic ulcer of left foot associated with diabetes mellitus due to underlying condition   Osteomyelitis   Ulcer of heel   Acute osteomyelitis of femur   Septic arthritis of knee, left   STEMI (ST elevation myocardial infarction)   Ulcer of left heel   Metabolic acidosis    Time spent: 40 minutes.    Richarda Overlie, MD [facility] Pager [phone]  If 7PM-7AM, please contact night-coverage [email or web address] Password [email or web address] 03/24/2014, 8:57 AM    LOS: 5 days

## 2014-03-24 NOTE — Progress Notes (Signed)
ANTIBIOTIC CONSULT NOTE - FOLLOW UP  Pharmacy Consult for Ancef Indication: recurrent MSSA bacteremia, Ecoli UTI  Allergies  Allergen Reactions  . Penicillins Rash    Patient Measurements: Height: 5\' 4"  (162.6 cm) Weight: 198 lb 6.6 oz (90 kg) IBW/kg (Calculated) : 54.7  Vital Signs: Temp: 97.4 F (36.3 C) (12/08 0400) Temp Source: Oral (12/08 0400) BP: 126/69 mmHg (12/08 0750) Pulse Rate: 96 (12/08 0750) Intake/Output from previous day: 12/07 0701 - 12/08 0700 In: 315.8 [I.V.:95.8; IV Piggyback:150] Out: 445 [Urine:445] Intake/Output from this shift: Total I/O In: 10 [Other:10] Out: -   Labs:  Recent Labs  03/22/14 0314 03/23/14 0350 03/24/14 0335  WBC 6.7 7.7 3.3*  HGB 9.7* 9.9* 8.9*  PLT 113* 108* 91*  CREATININE 0.48* 0.69 0.97   Estimated Creatinine Clearance: 67 mL/min (by C-G formula based on Cr of 0.97).  Recent Labs  03/22/14 1217  VANCOTROUGH 32.5*     Microbiology: Recent Results (from the past 720 hour(s))  Urine culture     Status: None   Collection Time: 03/19/14  3:04 PM  Result Value Ref Range Status   Specimen Description URINE, CATHETERIZED  Final   Special Requests NONE  Final   Culture  Setup Time   Final    03/20/2014 00:29 Performed at 14/07/2013 Count   Final    >=100,000 COLONIES/ML Performed at [facility]    Culture   Final    ESCHERICHIA COLI Note: Two isolates with different morphologies were identified as the same organism.The most resistant organism was reported. Performed at [facility]    Report Status 03/23/2014 FINAL  Final   Organism ID, Bacteria ESCHERICHIA COLI  Final      Susceptibility   Escherichia coli - MIC*    AMPICILLIN >=32 RESISTANT Resistant     CEFAZOLIN <=4 SENSITIVE Sensitive     CEFTRIAXONE <=1 SENSITIVE Sensitive     CIPROFLOXACIN <=0.25 SENSITIVE Sensitive     GENTAMICIN <=1 SENSITIVE Sensitive     LEVOFLOXACIN <=0.12 SENSITIVE Sensitive    NITROFURANTOIN <=16 SENSITIVE Sensitive     TOBRAMYCIN <=1 SENSITIVE Sensitive     TRIMETH/SULFA >=320 RESISTANT Resistant     PIP/TAZO <=4 SENSITIVE Sensitive     * ESCHERICHIA COLI  Blood Culture (routine x 2)     Status: None   Collection Time: 03/19/14  3:23 PM  Result Value Ref Range Status   Specimen Description BLOOD RIGHT WRIST  Final   Special Requests BOTTLES DRAWN AEROBIC AND ANAEROBIC 5 ML  Final   Culture  Setup Time   Final    03/20/2014 07:48 Performed at 14/07/2013    Culture   Final    STAPHYLOCOCCUS AUREUS Note: RIFAMPIN AND GENTAMICIN SHOULD NOT BE USED AS SINGLE DRUGS FOR TREATMENT OF STAPH INFECTIONS. Note: Gram Stain Report Called to,Read Back By and Verified With: FAYE S BY INGRAM A 03/20/14 11AM Performed at 14/4/15    Report Status 03/22/2014 FINAL  Final   Organism ID, Bacteria STAPHYLOCOCCUS AUREUS  Final      Susceptibility   Staphylococcus aureus - MIC*    CLINDAMYCIN <=0.25 SENSITIVE Sensitive     ERYTHROMYCIN <=0.25 SENSITIVE Sensitive     GENTAMICIN <=0.5 SENSITIVE Sensitive     LEVOFLOXACIN <=0.12 SENSITIVE Sensitive     OXACILLIN 0.5 SENSITIVE Sensitive     PENICILLIN RESISTANT      RIFAMPIN <=0.5 SENSITIVE Sensitive     TRIMETH/SULFA <=  10 SENSITIVE Sensitive     VANCOMYCIN <=0.5 SENSITIVE Sensitive     TETRACYCLINE <=1 SENSITIVE Sensitive     MOXIFLOXACIN <=0.25 SENSITIVE Sensitive     * STAPHYLOCOCCUS AUREUS  Blood Culture (routine x 2)     Status: None   Collection Time: 03/19/14  3:23 PM  Result Value Ref Range Status   Specimen Description BLOOD BLOOD LEFT FOREARM  Final   Special Requests BOTTLES DRAWN AEROBIC AND ANAEROBIC 5 ML  Final   Culture  Setup Time   Final    03/20/2014 07:51 Performed at [facility]    Culture   Final    STAPHYLOCOCCUS AUREUS Note: SUSCEPTIBILITIES PERFORMED ON PREVIOUS CULTURE WITHIN THE LAST 5 DAYS. Note: Gram Stain Report Called to,Read Back By and Verified With: FAYE S  BY INGRAM A 03/20/14 11AM Performed at [facility]    Report Status 03/22/2014 FINAL  Final  Culture, blood (routine x 2)     Status: None (Preliminary result)   Collection Time: 03/20/14  5:30 PM  Result Value Ref Range Status   Specimen Description BLOOD RIGHT HAND  Final   Special Requests BOTTLES DRAWN AEROBIC ONLY 8CC  Final   Culture  Setup Time   Final    03/21/2014 01:05 Performed at [facility]    Culture   Final           BLOOD CULTURE RECEIVED NO GROWTH TO DATE CULTURE WILL BE HELD FOR 5 DAYS BEFORE ISSUING A FINAL NEGATIVE REPORT Performed at [facility]    Report Status PENDING  Incomplete  Culture, blood (routine x 2)     Status: None (Preliminary result)   Collection Time: 03/20/14  5:37 PM  Result Value Ref Range Status   Specimen Description BLOOD LEFT ARM  Final   Special Requests   Final    BOTTLES DRAWN AEROBIC ONLY BLUE BOTTLE 9 CC, RED BOTTLE 1 CC   Culture  Setup Time   Final    03/21/2014 01:06 Performed at [facility]    Culture   Final           BLOOD CULTURE RECEIVED NO GROWTH TO DATE CULTURE WILL BE HELD FOR 5 DAYS BEFORE ISSUING A FINAL NEGATIVE REPORT Performed at [facility]    Report Status PENDING  Incomplete    Anti-infectives    Start     Dose/Rate Route Frequency Ordered Stop   03/21/14 1200  ceFAZolin (ANCEF) IVPB 2 g/50 mL premix     2 g100 mL/hr over 30 Minutes Intravenous Every 8 hours 03/21/14 1051     03/21/14 1041  vancomycin (VANCOCIN) 1 GM/200ML IVPB    Comments:  [name]   : cabinet override      03/21/14 1041 03/21/14 2244   03/20/14 1600  levofloxacin (LEVAQUIN) IVPB 750 mg  Status:  Discontinued     750 mg100 mL/hr over 90 Minutes Intravenous Every 24 hours 03/19/14 1629 03/19/14 1709   03/20/14 0000  vancomycin (VANCOCIN) 1,250 mg in sodium chloride 0.9 % 250 mL IVPB  Status:  Discontinued     1,250 mg166.7 mL/hr over 90 Minutes Intravenous Every 12 hours 03/19/14 1627  03/22/14 1238   03/20/14 0000  aztreonam (AZACTAM) 2 g in dextrose 5 % 50 mL IVPB  Status:  Discontinued     2 g100 mL/hr over 30 Minutes Intravenous Every 8 hours 03/19/14 1628 03/20/14 1147   03/19/14 1445  levofloxacin (LEVAQUIN) IVPB 750  mg     750 mg100 mL/hr over 90 Minutes Intravenous  Once 03/19/14 1435 03/19/14 1708   03/19/14 1445  aztreonam (AZACTAM) 2 g in dextrose 5 % 50 mL IVPB     2 g100 mL/hr over 30 Minutes Intravenous  Once 03/19/14 1435 03/19/14 1708   03/19/14 1445  vancomycin (VANCOCIN) IVPB 1000 mg/200 mL premix     1,000 mg200 mL/hr over 60 Minutes Intravenous  Once 03/19/14 1435 03/19/14 1707      Assessment: 58 yoF with RA and recent MSSA bacteremia 10/13, blood cx cleared on 10/16. Some notes claim pt placed on cefazolin 2gm Q8hr, however can only find that patient was on vancomycin per Epic records. Noted TEE on 10/19 , negative for vegetations. She was to finish 14 day course on 10/30 using 10/16 as day 1. She was discharged to SNF for IV abx and rehab. On 12/3 pt brought to ED for IVC as pt refusing care.   12/3 >> vancomycin >> 12/6 12/3 >> Aztreonam >> 12/4 12/3 >> Levaquin x 1 12/5 >> Ancef >>   Afebrile  WBC 3.3, down  Scr 0.97, up. Est CrCl 67 ml/min  12/3 blood x2: 2/2 MSSA (R=PCN only) 12/3 urine: >100k GNR - Ecoli - resistant to amp and Bactrim only 12/4 acute hepatitis panel: all negative 12/4 HIV Ab: none reactive 12/4 blood x2: ngtd  Today, 12/8: Day #4 Ancef. Per ID, still need to determine source of recurring infection - more to be rule out once patient more stable  Goal of Therapy:  treatment of infection, adjustment per renal function  Plan:  Continue Ancef 2g IV q8   Hessie Knows, PharmD, BCPS Pager [phone] 03/24/2014 8:47 AM

## 2014-03-24 NOTE — Plan of Care (Signed)
Problem: Phase II Progression Outcomes Goal: Progress activity as tolerated unless otherwise ordered Outcome: Not Progressing Unable to progress due to patients joint pain

## 2014-03-25 ENCOUNTER — Inpatient Hospital Stay (HOSPITAL_COMMUNITY): Payer: Medicaid Other

## 2014-03-25 DIAGNOSIS — E46 Unspecified protein-calorie malnutrition: Secondary | ICD-10-CM

## 2014-03-25 DIAGNOSIS — F329 Major depressive disorder, single episode, unspecified: Secondary | ICD-10-CM

## 2014-03-25 DIAGNOSIS — R7881 Bacteremia: Secondary | ICD-10-CM | POA: Insufficient documentation

## 2014-03-25 DIAGNOSIS — I249 Acute ischemic heart disease, unspecified: Secondary | ICD-10-CM

## 2014-03-25 DIAGNOSIS — M25569 Pain in unspecified knee: Secondary | ICD-10-CM | POA: Insufficient documentation

## 2014-03-25 LAB — CBC
HCT: 27.8 % — ABNORMAL LOW (ref 36.0–46.0)
HEMOGLOBIN: 9.1 g/dL — AB (ref 12.0–15.0)
MCH: 27.7 pg (ref 26.0–34.0)
MCHC: 32.7 g/dL (ref 30.0–36.0)
MCV: 84.5 fL (ref 78.0–100.0)
Platelets: 96 10*3/uL — ABNORMAL LOW (ref 150–400)
RBC: 3.29 MIL/uL — AB (ref 3.87–5.11)
RDW: 17.3 % — ABNORMAL HIGH (ref 11.5–15.5)
WBC: 3.7 10*3/uL — ABNORMAL LOW (ref 4.0–10.5)

## 2014-03-25 LAB — SYNOVIAL CELL COUNT + DIFF, W/ CRYSTALS
CRYSTALS FLUID: NONE SEEN
Crystals, Fluid: NONE SEEN
Lymphocytes-Synovial Fld: 5 % (ref 0–20)
Lymphocytes-Synovial Fld: 2 % (ref 0–20)
MONOCYTE-MACROPHAGE-SYNOVIAL FLUID: 3 % — AB (ref 50–90)
Monocyte-Macrophage-Synovial Fluid: 7 % — ABNORMAL LOW (ref 50–90)
NEUTROPHIL, SYNOVIAL: 88 % — AB (ref 0–25)
Neutrophil, Synovial: 95 % — ABNORMAL HIGH (ref 0–25)
WBC, SYNOVIAL: 11050 /mm3 — AB (ref 0–200)
WBC, Synovial: 40000 /mm3 — ABNORMAL HIGH (ref 0–200)

## 2014-03-25 LAB — GLUCOSE, CAPILLARY
GLUCOSE-CAPILLARY: 110 mg/dL — AB (ref 70–99)
GLUCOSE-CAPILLARY: 138 mg/dL — AB (ref 70–99)
Glucose-Capillary: 118 mg/dL — ABNORMAL HIGH (ref 70–99)
Glucose-Capillary: 122 mg/dL — ABNORMAL HIGH (ref 70–99)

## 2014-03-25 LAB — T4, FREE: Free T4: 0.89 ng/dL (ref 0.80–1.80)

## 2014-03-25 LAB — TSH: TSH: 5.08 u[IU]/mL — AB (ref 0.350–4.500)

## 2014-03-25 LAB — ALBUMIN: Albumin: 1.5 g/dL — ABNORMAL LOW (ref 3.5–5.2)

## 2014-03-25 MED ORDER — JEVITY 1.2 CAL PO LIQD: 1000.0000 mL | ORAL | Status: DC

## 2014-03-25 MED ORDER — GLUCERNA 1.2 CAL PO LIQD
1000.0000 mL | ORAL | Status: DC
  Administered 2014-03-25 – 2014-03-26 (×2): 1000 mL
  Filled 2014-03-25 (×6): qty 1000

## 2014-03-25 MED ORDER — ALBUMIN HUMAN 25 % IV SOLN
12.5000 g | Freq: Once | INTRAVENOUS | Status: AC
  Administered 2014-03-25: 12.5 g via INTRAVENOUS
  Filled 2014-03-25: qty 50

## 2014-03-25 MED ORDER — IOHEXOL 300 MG/ML  SOLN
50.0000 mL | Freq: Once | INTRAMUSCULAR | Status: AC | PRN
Start: 2014-03-25 — End: 2014-03-25
  Administered 2014-03-25: 3 mL via INTRATHECAL

## 2014-03-25 MED ORDER — VITAL HIGH PROTEIN PO LIQD
1000.0000 mL | ORAL | Status: DC
Start: 2014-03-25 — End: 2014-03-25

## 2014-03-25 NOTE — Progress Notes (Addendum)
NUTRITION FOLLOW-UP  INTERVENTION: -Initiate Glucerna 1.2 at 20 ml/hr via TF access and advance 10 ml every 10 hours (high refeeding risk) to goal rate of 65 ml/hr to provide 1872 kcal (100% est kcal needs), 94 gram protein (94% est protein needs) and 1256 ml free H2O -Recommend refeeding labs (Phos/K/Mg) for 48 hours -RD to continue to monitor  NUTRITION DIAGNOSIS: Inadequate oral intake related to decreased appetite/refusal of meals as evidenced by PO intake < 75% > one month, progressing.  Goal: Pt to meet >/= 90% of their estimated nutrition needs, unmet    Monitor:  Total protein/energy intake, labs, weights, supplement acceptance, skin integrity   Admitting Dx: Septic shock secondary to staph bacteremia  ASSESSMENT: 58 year old female with multiple medical problems who was discharged from the hospital in October after she was treated for MSSA bacteremia with sepsis.  As per patient's husband, patient did not want to eat or drink and she is not eating and drinking for past 5 days and so he got concerned that she wants to hurt herself. So he filled out IVC and patient was brought by the EMS.  Pt reports eating better today, NT agreed. Pt is taking small sips and bites of meals, PO improved. Pt states that she likes the Glucerna shakes and was drinking one during visit.  RD encouraged pt to eat as much as tolerated to promote wound healing.   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 Recommended use of TF vs TPN; however it is likely pt will refuse TF access. Husband verbalized understanding and planned on discussing with pt.  Encouraged nutrition to assist with anasarca, skin integrity and improve deconditioning -Discussed options with RN. RN in agreement, however also  concerned with pt's acceptance of NG/NJ tube -Followed up with pt; however pt's husband not in room -Attempted to contact husband; however was unable to reach. Left voicemail. -RD to follow up tomorrow for decision. Pharmacy in agreement to hold of TPN until decision is made by family -Modified supplement to Ensure as husband believed that she might be more agreeable to different flavors  12/09 -Pt agreeable to initiate TF -NG tube being placed by RN at time of RD follow up -Discussed pt with pharmacy, PharmD noted placement of feeding tube being confirmed by x-ray with plan to start TF as soon as placement confirmed -Modified recommendation to Glucerna 1.2 d/t hx of DM2 -Recommend refeeding labs and conservative advancement, advancement rate can be increased pending lab results  Height: Ht Readings from Last 1 Encounters:  03/19/14 5\' 4"  (1.626 m)    Weight: Wt Readings from Last 1 Encounters:  03/25/14 197 lb 5 oz (89.5 kg)   BMI:  Body mass index is 33.85 kg/(m^2).  Estimated Nutritional Needs: Kcal: 1800-2000 Protein: 100-110 gram Fluid: per MD  Skin: stg 2 pressure ulcer on heel and arm, deep tissue injury on heel, +4 generalized edema  Diet Order: Diet Carb Modified  EDUCATION NEEDS: -No education needs identified at this time   Intake/Output Summary (Last 24 hours) at 03/25/14 0944 Last data filed at 03/25/14 0600  Gross per 24 hour  Intake 1248.08 ml  Output    360 ml  Net 888.08 ml    Last BM: 12/5  Labs:   Recent Labs Lab 03/20/14 0820  03/22/14 0314 03/23/14 0350  03/24/14 0335  NA  --   < > 133* 132* 132*  K  --   < > 4.0 3.9 3.8  CL  --   < > 103 101 101  CO2  --   < > 18* 16* 18*  BUN  --   < > 24* 30* 38*  CREATININE  --   < > 0.48* 0.69 0.97  CALCIUM  --   < > 7.4* 7.6* 7.7*  MG 1.6  --  1.8 1.9  --   PHOS  --   --  1.9* 2.7  --   GLUCOSE  --   < > 276* 239* 123*  < > = values in this interval not displayed.  CBG (last 3)   Recent  Labs  03/24/14 1649 03/24/14 2111 03/25/14 0756  GLUCAP 162* 124* 110*    Scheduled Meds: . albumin human  12.5 g Intravenous Once  . antiseptic oral rinse  7 mL Mouth Rinse q12n4p  . aspirin  81 mg Oral Daily  . atorvastatin  20 mg Oral q1800  .  ceFAZolin (ANCEF) IV  2 g Intravenous Q8H  . chlorhexidine  15 mL Mouth Rinse BID  . collagenase   Topical Daily  . [START ON 03/26/2014] enoxaparin (LOVENOX) injection  40 mg Subcutaneous Q24H  . feeding supplement (ENSURE COMPLETE)  237 mL Oral TID BM  . feeding supplement (GLUCERNA 1.2 CAL)  1,000 mL Per Tube Q24H  . fludrocortisone  0.1 mg Oral Daily  . furosemide  40 mg Intravenous BID  . insulin aspart  0-5 Units Subcutaneous QHS  . insulin aspart  0-9 Units Subcutaneous TID WC  . insulin glargine  15 Units Subcutaneous Daily  . mirtazapine  15 mg Oral QHS  . OLANZapine zydis  5 mg Oral QHS  . predniSONE  20 mg Oral Q breakfast  . sodium bicarbonate  650 mg Oral TID  . sodium chloride  10-40 mL Intracatheter Q12H    Continuous Infusions: . sodium chloride 75 mL/hr at 03/25/14 0437  . phenylephrine (NEO-SYNEPHRINE) Adult infusion Stopped (03/23/14 0800)    Lloyd Huger MS RD LDN Clinical Dietitian Pager:[phone]

## 2014-03-25 NOTE — Plan of Care (Signed)
Problem: Phase I Progression Outcomes Goal: OOB as tolerated unless otherwise ordered Outcome: Not Progressing Nursing staff encourages patient to move, but often pt is non-compliant with activity.  Pt complains when turning and often times refuses to turn.  Continue to encourage patient participation.

## 2014-03-25 NOTE — Progress Notes (Addendum)
PROGRESS NOTE    Robin Schroeder KGU:[id number] DOB: [birth year] DOA: 03/19/2014 PCP: No PCP Per Patient  HPI/Brief narrative 58 year old female patient with history of hypertension, obesity, DM, chronic diastolic CHF, possible rheumatoid arthritis, recent hospitalization for MSSA bacteremia of unknown source/neg TEE- cleared after Vancomycin (PCN allergy), went to SNF and then discharged from their to home, presented with complaints of not eating or drinking 4 days and husband was concerned that patient was trying to hurt herself and she was IVC and brought to ED where she was assessed as sepsis of unclear source and admitted to stepdown for further management. Patient went into septic shock requiring pressors. She also developed STEMI but poor overall candidate for aggressive intervention i.e. Currently. CCM, ID and cardiology assisting with care. Shock resolved- pressors DC'ed 12/7 AM.   Assessment/Plan:  1. Septic shock secondary to MSSA bacteremia: Patient had been empirically started on IV vancomycin and aztreonam. Source not clear-? Osteomyelitis of medial left knee on x-ray. Infectious disease follow-up appreciated. Antibiotics narrowed to IV Ancef. Shock improved/resolved and DC'd pressors 12/7 AM. Discontinued IV hydrocortisone (tapering dose), continue Fludrocortisone. As per ID, will eventually need repeat TEE when medically stable. Obtain MRI of left knee,foot ,ankle , attempt to do this under no sedation today.if not tolerated then Needs conscious sedation,. Surveillance blood cultures 2 (12/4): Negative to date.OK to put a PICC line  Per ID .ID recommends B/L arthrocentesis , pending   2. ? Osteomyelitis of medial left knee on x-ray: ID follow-up appreciated. MRI of left knee when stable. Orthopedic consultation not called yet, Discussed with ID , will request IR to tap both knees .   3. Left lower lobe airspace disease: Possibly chronic.? Pneumonia. ID and pulmonary to weigh in  the need for broadening antibiotic coverage. In the absence of respiratory symptoms, felt less likely.  4. Dehydration with hyponatremia: Resolved.    5. Inferiolateral wall STEMI: Patient has not complained of chest pain since admission. Troponins steadily increased to >20 on 12/3. Not candidate for aggressive intervention i.e. cath secondary to unstable status from shock, anemia and thrombocytopenia. Patient treated with 48 hours of IV heparin-DC'd 12/7 and aspirin-tolerating. Will eventually need cardiac catheterization when stable. 2-D echo shows EF of 50-55% the distal inferior and apical hypokinesis that is new.. .  Cards will clear her for MRI as conscious sedation is needed .   6. IVC/?? Intention to self harm: IVC removed . Psychiatry consultation note reviewed . Discussed with Dr. Tenny Craw 12/5. Patient has capacity to make her own medical decisions and living arrangement. Meds adjusted by psyche . No evidence of imminent risk to self. Sitter discontinued.   7. Anasarca: Secondary to ongoing illnesses, malnutrition and hypo-albuminemia. Treat underlying cause and attempt to improve nutritional status. Being started on enteral feeding today.   8. Right sacral decubitus: Does not appear grossly infected. Wound care consultation requested. 9. Hypertension: Patient in septic shock. Management as above. 10. Type II DM: Uncontrolled. Likely worsened by steroids. Added low-dose Lantus-we'll titrate up and continue SSI. 11. Possible rheumatoid arthritis: Patient was on prednisone at home. Patient placed on 20 mg of prednisone. RA related pain was probably the reason she had been bed bound since recent DC from SNF. Recommend an OP Rheumatology consultation appointment be set up for her prior to DC- this can be done when she is stable and approaching DC.   12. Chronic diastolic CHF: Seems compensated. 13. Hypokalemia: Replaced. 14. Anemia: Acute on chronic Unclear etiology.  Baseline hemoglobin  probably in the 9 g per DL range. Status post 1 unit PRBC 12/4 with appropriate response. Hemoglobin stable/improving posttransfusion. 15. Thrombocytopenia: Possibly from acute illness/sepsis. Continues to improve. Follow CBCs. 16. Multiple pressure wounds: Details as per WOC nurse note on 12/4. Patient has left heel DTI, right arm full-thickness abrasion and unstageable right buttock wound. 17. Failure to thrive: Multifactorial 18. Severe deconditioning: PT and OT evaluation unable. 19. Severe protein calorie malnutrition: Dietitian consulted. Being started on enteral feeding today. Follow labs to monitor for refeeding syndrome  20. Depressive disorder secondary to general medical condition: Management per psychiatry. zyprexa  added. Requested psychiatry to reassess.    21. Oliguria: Likely secondary to third spacing of fluids in the setting of severe protein calorie malnutrition. Marland Kitchen Poor response to Lasix. Trial of albumin infusion today. 22.  23. E Coli UTI: Continue Ancef- sensitive. 24. Mild non-anion gap metabolic acidosis: Unclear etiology. Start by mouth bicarbonate. Follow BMP in a.m.    Code Status: Limited code Family Communication: discussed with spouse in detail on 03/23/14. Disposition Plan: Remains in ICU. Not medically stable for discharge.   Consultants:  Cardiology  ID  Psychiatry  PCCM-Signed off  Procedures:  Arterial line-discontinued  Foley catheter  Antibiotics:  IV Vanc 12/3>DC'd   IV Aztreonam 12/3> 12/4   IV Ancef.  Subjective: Denies complaints. Denies suicidal ideations-however nurses concerned regarding patient's statements. Complains of pain in both legs but no specific location. Denies dyspnea or chest pain.    Objective: Filed Vitals:   03/25/14 0000 03/25/14 0030 03/25/14 0400 03/25/14 0800  BP: 89/43 106/57 109/50 104/52  Pulse: 95 96 86 86  Temp: 97.9 F (36.6 C)  97.7 F (36.5 C) 98.1 F (36.7 C)  TempSrc: Axillary  Axillary  Oral  Resp: 16 16 15 11   Height:      Weight:   89.5 kg (197 lb 5 oz)   SpO2: 95% 95% 97% 97%   blood pressures actually better than listed bowel-as discussed with nursing systolic in the 100s and map 73   Intake/Output Summary (Last 24 hours) at 03/25/14 1006 Last data filed at 03/25/14 1000  Gross per 24 hour  Intake 1663.08 ml  Output    360 ml  Net 1303.08 ml   Filed Weights   03/23/14 0500 03/24/14 0400 03/25/14 0400  Weight: 87.6 kg (193 lb 2 oz) 90 kg (198 lb 6.6 oz) 89.5 kg (197 lb 5 oz)     Exam:  General exam: Pleasant middle-aged female, moderately built and obese lying comfortably supine in bed. Respiratory system: Slightly diminished breath sounds in the bases but otherwise clear to auscultation. No increased work of breathing. Cardiovascular system: S1 & S2 heard, RRR. No JVD, murmurs, gallops, clicks. Anasarca. Telemetry: SB in the 50s-SR. Gastrointestinal system: Abdomen is nondistended, soft and nontender. Normal bowel sounds heard. Patient has patchy ecchymosis over right upper quadrant. Central nervous system: Alert and oriented 3. No focal neurological deficits. Extremities: Symmetric 5 x 5 power. Left knewithout acute findings today. Skin: Dry appearing right sacral decubitus without signs of overt infection. Patient has skin tear over the right posterior upper arm. Left heel DTI without signs of acute infection. Psychiatry: Pleasant and comfortable today. Denies suicidal ideations.   Data Reviewed: Basic Metabolic Panel:  Recent Labs Lab 03/20/14 0108 03/20/14 0820 03/21/14 0357 03/22/14 0314 03/23/14 0350 03/24/14 0335  NA 129*  --  135* 133* 132* 132*  K 3.2*  --  3.7 4.0 3.9  3.8  CL 98  --  105 103 101 101  CO2 22  --  21 18* 16* 18*  GLUCOSE 150*  --  218* 276* 239* 123*  BUN 17  --  15 24* 30* 38*  CREATININE 0.29*  --  0.25* 0.48* 0.69 0.97  CALCIUM 6.5*  --  7.4* 7.4* 7.6* 7.7*  MG  --  1.6  --  1.8 1.9  --   PHOS  --   --   --  1.9* 2.7   --    Liver Function Tests:  Recent Labs Lab 03/19/14 1508 03/20/14 0108 03/21/14 0357  AST 46* 60* 72*  ALT ALKPHOS 219* 157* 203*  BILITOT 1.6* 1.0 1.0  PROT 6.6 4.9* 5.4*  ALBUMIN 1.2* 0.9* 1.0*   No results for input(s): LIPASE, AMYLASE in the last 168 hours. No results for input(s): AMMONIA in the last 168 hours. CBC:  Recent Labs Lab 03/19/14 1508  03/21/14 0357 03/22/14 0314 03/23/14 0350 03/24/14 0335 03/25/14 0440  WBC 4.2  < > 5.4 6.7 7.7 3.3* 3.7*  NEUTROABS 3.8  --  4.4  --   --   --   --   HGB 7.9*  < > 8.7* 9.7* 9.9* 8.9* 9.1*  HCT 24.7*  < > 26.9* 30.2* 30.7* 27.3* 27.8*  MCV 84.3  < > 84.3 84.4 84.8 82.2 84.5  PLT 77*  < > 81* 113* 108* 91* 96*  < > = values in this interval not displayed. Cardiac Enzymes:  Recent Labs Lab 03/19/14 1944 03/20/14 0108 03/20/14 0820 03/24/14 0335  TROPONINI 0.82* 7.66* >20.00* 2.28*   BNP (last 3 results) No results for input(s): PROBNP in the last 8760 hours. CBG:  Recent Labs Lab 03/24/14 0756 03/24/14 1203 03/24/14 1649 03/24/14 2111 03/25/14 0756  GLUCAP 116* 159* 162* 124* 110*    Recent Results (from the past 240 hour(s))  Urine culture     Status: None   Collection Time: 03/19/14  3:04 PM  Result Value Ref Range Status   Specimen Description URINE, CATHETERIZED  Final   Special Requests NONE  Final   Culture  Setup Time   Final    03/20/2014 00:29 Performed at [facility] Count   Final    >=100,000 COLONIES/ML Performed at [facility]    Culture   Final    ESCHERICHIA COLI Note: Two isolates with different morphologies were identified as the same organism.The most resistant organism was reported. Performed at [facility]    Report Status 03/23/2014 FINAL  Final   Organism ID, Bacteria ESCHERICHIA COLI  Final      Susceptibility   Escherichia coli - MIC*    AMPICILLIN >=32 RESISTANT Resistant     CEFAZOLIN <=4 SENSITIVE Sensitive      CEFTRIAXONE <=1 SENSITIVE Sensitive     CIPROFLOXACIN <=0.25 SENSITIVE Sensitive     GENTAMICIN <=1 SENSITIVE Sensitive     LEVOFLOXACIN <=0.12 SENSITIVE Sensitive     NITROFURANTOIN <=16 SENSITIVE Sensitive     TOBRAMYCIN <=1 SENSITIVE Sensitive     TRIMETH/SULFA >=320 RESISTANT Resistant     PIP/TAZO <=4 SENSITIVE Sensitive     * ESCHERICHIA COLI  Blood Culture (routine x 2)     Status: None   Collection Time: 03/19/14  3:23 PM  Result Value Ref Range Status   Specimen Description BLOOD RIGHT WRIST  Final   Special Requests BOTTLES DRAWN AEROBIC AND ANAEROBIC  5 ML  Final   Culture  Setup Time   Final    03/20/2014 07:48 Performed at [facility]    Culture   Final    STAPHYLOCOCCUS AUREUS Note: RIFAMPIN AND GENTAMICIN SHOULD NOT BE USED AS SINGLE DRUGS FOR TREATMENT OF STAPH INFECTIONS. Note: Gram Stain Report Called to,Read Back By and Verified With: FAYE S BY INGRAM A 03/20/14 11AM Performed at [facility]    Report Status 03/22/2014 FINAL  Final   Organism ID, Bacteria STAPHYLOCOCCUS AUREUS  Final      Susceptibility   Staphylococcus aureus - MIC*    CLINDAMYCIN <=0.25 SENSITIVE Sensitive     ERYTHROMYCIN <=0.25 SENSITIVE Sensitive     GENTAMICIN <=0.5 SENSITIVE Sensitive     LEVOFLOXACIN <=0.12 SENSITIVE Sensitive     OXACILLIN 0.5 SENSITIVE Sensitive     PENICILLIN RESISTANT      RIFAMPIN <=0.5 SENSITIVE Sensitive     TRIMETH/SULFA <=10 SENSITIVE Sensitive     VANCOMYCIN <=0.5 SENSITIVE Sensitive     TETRACYCLINE <=1 SENSITIVE Sensitive     MOXIFLOXACIN <=0.25 SENSITIVE Sensitive     * STAPHYLOCOCCUS AUREUS  Blood Culture (routine x 2)     Status: None   Collection Time: 03/19/14  3:23 PM  Result Value Ref Range Status   Specimen Description BLOOD BLOOD LEFT FOREARM  Final   Special Requests BOTTLES DRAWN AEROBIC AND ANAEROBIC 5 ML  Final   Culture  Setup Time   Final    03/20/2014 07:51 Performed at [facility]    Culture   Final     STAPHYLOCOCCUS AUREUS Note: SUSCEPTIBILITIES PERFORMED ON PREVIOUS CULTURE WITHIN THE LAST 5 DAYS. Note: Gram Stain Report Called to,Read Back By and Verified With: FAYE S BY INGRAM A 03/20/14 11AM Performed at [facility]    Report Status 03/22/2014 FINAL  Final  Culture, blood (routine x 2)     Status: None (Preliminary result)   Collection Time: 03/20/14  5:30 PM  Result Value Ref Range Status   Specimen Description BLOOD RIGHT HAND  Final   Special Requests BOTTLES DRAWN AEROBIC ONLY 8CC  Final   Culture  Setup Time   Final    03/21/2014 01:05 Performed at [facility]    Culture   Final           BLOOD CULTURE RECEIVED NO GROWTH TO DATE CULTURE WILL BE HELD FOR 5 DAYS BEFORE ISSUING A FINAL NEGATIVE REPORT Performed at [facility]    Report Status PENDING  Incomplete  Culture, blood (routine x 2)     Status: None (Preliminary result)   Collection Time: 03/20/14  5:37 PM  Result Value Ref Range Status   Specimen Description BLOOD LEFT ARM  Final   Special Requests   Final    BOTTLES DRAWN AEROBIC ONLY BLUE BOTTLE 9 CC, RED BOTTLE 1 CC   Culture  Setup Time   Final    03/21/2014 01:06 Performed at [facility]    Culture   Final           BLOOD CULTURE RECEIVED NO GROWTH TO DATE CULTURE WILL BE HELD FOR 5 DAYS BEFORE ISSUING A FINAL NEGATIVE REPORT Performed at [facility]    Report Status PENDING  Incomplete         Studies: Dg Abd Portable 1v  03/25/2014   CLINICAL DATA:  Feeding tube placement.  EXAM: PORTABLE ABDOMEN - 1 VIEW  COMPARISON:  None.  FINDINGS: Distal tip of feeding tube is seen in the expected position of the proximal stomach. Visualized bowel appears normal.  IMPRESSION: Distal tip of feeding tube is seen in expected position of the proximal stomach.   Electronically Signed   By: Roque Lias M.D.   On: 03/25/2014 09:56        Scheduled Meds: . antiseptic oral rinse  7 mL Mouth Rinse q12n4p  . aspirin   81 mg Oral Daily  . atorvastatin  20 mg Oral q1800  .  ceFAZolin (ANCEF) IV  2 g Intravenous Q8H  . chlorhexidine  15 mL Mouth Rinse BID  . collagenase   Topical Daily  . [START ON 03/26/2014] enoxaparin (LOVENOX) injection  40 mg Subcutaneous Q24H  . feeding supplement (ENSURE COMPLETE)  237 mL Oral TID BM  . feeding supplement (GLUCERNA 1.2 CAL)  1,000 mL Per Tube Q24H  . fludrocortisone  0.1 mg Oral Daily  . furosemide  40 mg Intravenous BID  . insulin aspart  0-5 Units Subcutaneous QHS  . insulin aspart  0-9 Units Subcutaneous TID WC  . insulin glargine  15 Units Subcutaneous Daily  . mirtazapine  15 mg Oral QHS  . OLANZapine zydis  5 mg Oral QHS  . predniSONE  20 mg Oral Q breakfast  . sodium bicarbonate  650 mg Oral TID  . sodium chloride  10-40 mL Intracatheter Q12H   Continuous Infusions: . sodium chloride 75 mL/hr at 03/25/14 0437  . phenylephrine (NEO-SYNEPHRINE) Adult infusion Stopped (03/23/14 0800)    Active Problems:   DM2 (diabetes mellitus, type 2)   Physical deconditioning   Hypoalbuminemia   Hyponatremia   Severe sepsis   Acute UTI   Protein-calorie malnutrition, severe   Sacral decubitus ulcer   Acute pulmonary edema   Effusion into joint   ST elevation myocardial infarction (STEMI) of inferolateral wall, initial episode of care   Severe sepsis with septic shock   Diabetic ulcer of left foot associated with diabetes mellitus due to underlying condition   Osteomyelitis   Ulcer of heel   Acute osteomyelitis of femur   Septic arthritis of knee, left   STEMI (ST elevation myocardial infarction)   Ulcer of left heel   Metabolic acidosis    Time spent: 40 minutes.    Richarda Overlie, MD [facility] Pager [phone]  If 7PM-7AM, please contact night-coverage [email or web address] Password [email or web address] 03/25/2014, 10:06 AM    LOS: 6 days

## 2014-03-25 NOTE — Progress Notes (Signed)
[facility]    Date of Admission:  03/19/2014   Total days of antibiotics 7        Day 5 cefazolin           ID: Robin Schroeder is a 58 y.o. female with recurrent MSSA bacteremia c/b STEMI, AKI  Active Problems:   DM2 (diabetes mellitus, type 2)   Physical deconditioning   Hypoalbuminemia   Hyponatremia   Severe sepsis   Acute UTI   Protein-calorie malnutrition, severe   Sacral decubitus ulcer   Acute pulmonary edema   Effusion into joint   ST elevation myocardial infarction (STEMI) of inferolateral wall, initial episode of care   Severe sepsis with septic shock   Diabetic ulcer of left foot associated with diabetes mellitus due to underlying condition   Osteomyelitis   Ulcer of heel   Acute osteomyelitis of femur   Septic arthritis of knee, left   STEMI (ST elevation myocardial infarction)   Ulcer of left heel   Metabolic acidosis    Subjective: Afebrile, WBC is back to baseline. Still diffusely tender to all joints. Has had poor po intake. Had ng placed for tube feeds   Medications:  . antiseptic oral rinse  7 mL Mouth Rinse q12n4p  . aspirin  81 mg Oral Daily  . atorvastatin  20 mg Oral q1800  .  ceFAZolin (ANCEF) IV  2 g Intravenous Q8H  . chlorhexidine  15 mL Mouth Rinse BID  . collagenase   Topical Daily  . [START ON 03/26/2014] enoxaparin (LOVENOX) injection  40 mg Subcutaneous Q24H  . feeding supplement (ENSURE COMPLETE)  237 mL Oral TID BM  . feeding supplement (GLUCERNA 1.2 CAL)  1,000 mL Per Tube Q24H  . fludrocortisone  0.1 mg Oral Daily  . insulin aspart  0-5 Units Subcutaneous QHS  . insulin aspart  0-9 Units Subcutaneous TID WC  . insulin glargine  15 Units Subcutaneous Daily  . mirtazapine  15 mg Oral QHS  . OLANZapine zydis  5 mg Oral QHS  . predniSONE  20 mg Oral Q breakfast  . sodium bicarbonate  650 mg Oral TID  . sodium chloride  10-40 mL Intracatheter Q12H    Objective: Vital signs in last 24 hours: Temp:   [97.5 F (36.4 C)-98.5 F (36.9 C)] 98.1 F (36.7 C) (12/09 0800) Pulse Rate:  [86-128] 103 (12/09 1200) Resp:  [11-21] 13 (12/09 1200) BP: (89-115)/(43-71) 115/53 mmHg (12/09 1200) SpO2:  [92 %-98 %] 92 % (12/09 1200) Weight:  [197 lb 5 oz (89.5 kg)] 197 lb 5 oz (89.5 kg) (12/09 0400) Physical Exam  Constitutional: . appears anasarcic, disheveled. Sleeping, answering more questions today HENT: ng in place Mouth/Throat: Oropharynx is dry. No oropharyngeal exudate. Poor dentition Cardiovascular: Normal rate, regular rhythm and normal heart sounds. Exam reveals no gallop and no friction rub.  No murmur heard.  Pulmonary/Chest: Effort normal and breath sounds normal. No respiratory distress.  has no wheezes.  Abdominal: Soft. Bowel sounds are normal.  exhibits no distension. There is no tenderness.  Lymphadenopathy: no cervical adenopathy.  Neurological: alert and oriented to person, place, and time.  Skin: heel ulcer to medial aspect of left leg. echymosis to right foot sole, decub to right calf Ext: diffuse anasarca to L> R arm, but also anasarca to all extremities Psychiatric: flat affect,  answering qeustions today more so than yesterday or day prior  Lab Results  Recent Labs  03/23/14 0350 03/24/14  0335 03/25/14 0440  WBC 7.7 3.3* 3.7*  HGB 9.9* 8.9* 9.1*  HCT 30.7* 27.3* 27.8*  NA 132* 132*  --   K 3.9 3.8  --   CL 101 101  --   CO2 16* 18*  --   BUN 30* 38*  --   CREATININE 0.69 0.97  --    Lab Results  Component Value Date   ESRSEDRATE 90* 03/21/2014   Lab Results  Component Value Date   CRP 16.1* 03/21/2014    Microbiology: 12/3 blood cx x 2 MSSA 12/4 blood cx NGTD  Studies/Results: Dg Abd Portable 1v  03/25/2014   CLINICAL DATA:  Feeding tube placement.  EXAM: PORTABLE ABDOMEN - 1 VIEW  COMPARISON:  None.  FINDINGS: Distal tip of feeding tube is seen in the expected position of the proximal stomach. Visualized bowel appears normal.  IMPRESSION: Distal  tip of feeding tube is seen in expected position of the proximal stomach.   Electronically Signed   By: Roque Lias M.D.   On: 03/25/2014 09:56    Assessment/Plan: Recurrent MSSA bacteremia but also has numerous pressure ulcers, unable to mobilize due to deconditioning and polyarthritis from untreated rheumatoid arthritis,(newly diagnosed in Oct 2015)  MSSA bacteremia = still need to determine source of infection. Due to ACS, she is unable to undergo any extensive sedation for TEE or MRI.For now, we will plane to treat for extended course with 6 wk of IV therapy. Await to do arthrocentesis of knees to see if she has septic arthritis.To send for culture, cell count and crystals  Can probably do  foot/heel MRI to see if signs of osteomyelitis. There is prn dilaudid to treat pain and would not need sedation for foot mri.   For now keep on renally dosed cefazolin, she continues to have AKI, cr up to 0.9 with her baseline at 0.3. Will likely place her on continued IV antibiotics for 6 wks.  aki = appears to have prerenal azotemia but also possibly due to vanco toxicity since vanco trough was 32 on 12/6. Would increase fluids. Would observe closely since her baseline cr is 0.3 and has already increased to 0.9. Will need renally dosed regimen of her medications. Needs BMP today  Anasarca  2/2 protein caloric malnutrition= starting TF today. Her malnutrition probably occurred over a period of a few months. I am concern that her lack of appetite is likely due to depression or other mental illness  Pressure ulcer = continue with local wound care  Newly diagnosed Rheumatoid Arthritis = would recommend to do higher doses of prednisone 20 mg daily to see if she has temporary relief of joint pain, possibly increased appetite. I suspect her immobility is in part due other polyarthritis from RA.   - recommend to get case management started on medicaid app so that she may have resources and access to care when  she gets discharged  ACS/STEMI= in setting of sepsis from MSSA. Will defer to cardiology for management. Echo report shows LVEF 50 to 55% with distal inferior and apical hypokinesis that is new.  Depression =  TSH and FT4 pending in the morning labs  [facility], [facility] Cell: [phone] Pager: [phone]  03/25/2014, 2:20 PM

## 2014-03-25 NOTE — Progress Notes (Signed)
CARE MANAGEMENT NOTE 03/25/2014  Patient:  Robin Schroeder, Robin Schroeder   Account Number:  [id number]  Date Initiated:  03/21/2014  Documentation initiated by:  HARRIS,CRYSTAL  Subjective/Objective Assessment:   MSSA bacteremia     Action/Plan:   awaiting final recommendations   Anticipated DC Date:  03/28/2014   Anticipated DC Plan:  HOME W HOME HEALTH SERVICES      DC Planning Services  CM consult      Choice offered to / List presented to:             Status of service:  In process, will continue to follow Medicare Important Message given?   (If response is "NO", the following Medicare IM given date fields will be blank) Date Medicare IM given:   Medicare IM given by:   Date Additional Medicare IM given:   Additional Medicare IM given by:    Discharge Disposition:    Per UR Regulation:  Reviewed for med. necessity/level of care/duration of stay  If discussed at Long Length of Stay Meetings, dates discussed:   03/24/2014    Comments:  [id number] Earlene Plater, RN, BSN, CCM: Chart reviewed for continued stay and patient discharge needs. At time of review no discharge needs present will follow. Chart note for progression of care: Cardiac  Echo 12/4 showed LVEF low normal with focal wall motion abnormality  May still have been related to sepsis. For now would provide supportive care  She is pain free/BP limits Rx/Severe volume overload but third spacing  Note BUN/Cr have bumped. Would hold lasix for now  To get albumin Should have ischemic eval (cath) when medical problems resolve] 2.  ID  MRI done  Aspiration of knee later today  Patient remains critcially ill

## 2014-03-25 NOTE — Progress Notes (Signed)
Clinical Social Work  CSW rounded with psych MD. Patient laying in bed and sleeping when medical team arrived. Patient minimally engaged and reports she is feeling a little better. Psych MD will continue to round. No family at bedside. Unit CSW to assist with SNF placement once patient is more medically stable. Patient and husband were agreeable to SNF placement on 12/8. Husband not at bedside when CSW rounded but CSW will continue to follow.  [place], [place] [phone]

## 2014-03-25 NOTE — Consult Note (Deleted)
Psychiatry Consult Follow Up Note  Reason for Consult:  Depression and capacity evaluation Referring Physician:  Dr. [name] is an 58 y.o. female. Total Time spent with patient: 20 minutes  Assessment: AXIS I:  Mood Disorder NOS AXIS II:  Deferred AXIS III:   Past Medical History  Diagnosis Date  . Hypertension   . Diabetes mellitus without complication   . Arthritis   . Coronary artery disease     pt unaware   AXIS IV:  other psychosocial or environmental problems, problems related to social environment and problems with primary support group AXIS V:  51-60 moderate symptoms  Plan: Patient has capacity to make her own medical decisions and living arrangement Continue Remeron 15 mg PO Qhs and increase Zyprexa 5 mg PO Qhs for better appetite No evidence of imminent risk to self or others at present.   Patient does not meet criteria for psychiatric inpatient admission. Supportive therapy provided about ongoing stressors.  Appreciate psychiatric consultation and follow up as clinically required Please contact [phone] or [phone] if needs further assistance  Subjective:   Robin Schroeder is a 58 y.o. female patient admitted with refuses eating and drinking.  HPI:  This is a 58 year old female seen and chart reviewed. Patient has two sister's at bed side when walked into her room, patient asked them to step out for this evaluation. She lives with her husband who is not in hospital at this time. She wishes to see her brother while in hospital. Patient is seen with Sindy Messing, LCSW for psych consultation for capacity evaluation and possible suicidal ideations. Patient stated that she wants straighten up and go home. She has denied depression, anxiety, and psychosis. She has denied suicidal or homicidal ideation, intention or psychosis. She has fair insight into her medical and clinical condition and upset when family concern about her not eating and saying why she  should eat when she was no hungry and stated that she is eating every day as much as she can. \  Interval History: Patient appeared lying down on her back, awake, responds verbally but she is a poor historian. She endorses not feeling good, not sleeping and eating well. She has multiple medical problems and going to medical investigation at this time. Staff RN reported that she was taken only few sips of Ensure when offered. She is mostly closing her eyes and responds few words when talk to her. She has some what relaxed than yesterday. She has no family members at bed side today but spoke with Sindy Messing, LCSW who spoke with patient husband earlier today.   Medical history: patient with multiple medical problems who was discharged from the hospital in October after she was treated for MSSA bacteremia with sepsis. She was found to have MSSA bacteremia on 10/13, blood cultures cleared on 10/16 and she was placed on cefazolin. Patient underwent TEE on 1019 which was negative for vegetations. Patient was discharged to skilled nursing facility for IV antibiotics and rehabilitation. Patient is a very poor historian, and husband is not at bedside. Patient's sister is at bedside. So most of the history obtained from sister and ED physician. As per patient's husband, patient did not want to eat or drink and she is not eating and drinking for past 5 days and so he got concerned that she wants to hurt herself. So he filled out IVC and patient was brought by the EMS. Patient was discharged from the nursing home  about 2 weeks ago and has been nonambulatory. There has been has been providing care with feeding and diaper change when needed. Patient does have decubitus ulcers. Patient denies any chest pain complains of back pain, no nausea vomiting or diarrhea. She has temp of 103.2, and was hypotensive. Patient started on empiric antibiotics including vancomycin, Azactam and Levaquin by the ED physician.  HPI  Elements: Location:  depression. Quality:  wheel chair bound. Severity:  multiple medical problems'. Timing:  unknown stresses.  Past Psychiatric History: Past Medical History  Diagnosis Date  . Hypertension   . Diabetes mellitus without complication   . Arthritis   . Coronary artery disease     pt unaware    reports that she has never smoked. She does not have any smokeless tobacco history on file. She reports that she does not drink alcohol or use illicit drugs. Family History  Problem Relation Age of Onset  . Hypertension Brother      Living Arrangements: Spouse/significant other   Abuse/Neglect [facility]) Physical Abuse: Denies Verbal Abuse: Denies Sexual Abuse: Denies Allergies:   Allergies  Allergen Reactions  . Penicillins Rash    ACT Assessment Complete:  NO Objective: Blood pressure 115/53, pulse 103, temperature 98.1 F (36.7 C), temperature source Oral, resp. rate 13, height 5' 4"  (1.626 m), weight 89.5 kg (197 lb 5 oz), SpO2 92 %.Body mass index is 33.85 kg/(m^2). Results for orders placed or performed during the hospital encounter of 03/19/14 (from the past 72 hour(s))  Glucose, capillary     Status: Abnormal   Collection Time: 03/22/14  5:32 PM  Result Value Ref Range   Glucose-Capillary 253 (H) 70 - 99 mg/dL   Comment 1 Documented in Chart    Comment 2 Notify RN   Heparin level (unfractionated)     Status: None   Collection Time: 03/22/14  7:43 PM  Result Value Ref Range   Heparin Unfractionated 0.51 0.30 - 0.70 IU/mL    Comment:        IF HEPARIN RESULTS ARE BELOW EXPECTED VALUES, AND PATIENT DOSAGE HAS BEEN CONFIRMED, SUGGEST FOLLOW UP TESTING OF ANTITHROMBIN III LEVELS. Performed at [facility]   Glucose, capillary     Status: Abnormal   Collection Time: 03/22/14  8:06 PM  Result Value Ref Range   Glucose-Capillary 254 (H) 70 - 99 mg/dL  CBC     Status: Abnormal   Collection Time: 03/23/14  3:50 AM  Result Value Ref Range   WBC 7.7  4.0 - 10.5 K/uL   RBC 3.62 (L) 3.87 - 5.11 MIL/uL   Hemoglobin 9.9 (L) 12.0 - 15.0 g/dL   HCT 30.7 (L) 36.0 - 46.0 %   MCV 84.8 78.0 - 100.0 fL   MCH 27.3 26.0 - 34.0 pg   MCHC 32.2 30.0 - 36.0 g/dL   RDW 16.7 (H) 11.5 - 15.5 %   Platelets 108 (L) 150 - 400 K/uL    Comment: CONSISTENT WITH PREVIOUS RESULT  Heparin level (unfractionated)     Status: Abnormal   Collection Time: 03/23/14  3:50 AM  Result Value Ref Range   Heparin Unfractionated 0.75 (H) 0.30 - 0.70 IU/mL    Comment:        IF HEPARIN RESULTS ARE BELOW EXPECTED VALUES, AND PATIENT DOSAGE HAS BEEN CONFIRMED, SUGGEST FOLLOW UP TESTING OF ANTITHROMBIN III LEVELS. Performed at [place] metabolic panel     Status: Abnormal   Collection Time: 03/23/14  3:50  AM  Result Value Ref Range   Sodium 132 (L) 137 - 147 mEq/L   Potassium 3.9 3.7 - 5.3 mEq/L   Chloride 101 96 - 112 mEq/L   CO2 16 (L) 19 - 32 mEq/L   Glucose, Bld 239 (H) 70 - 99 mg/dL   BUN 30 (H) 6 - 23 mg/dL   Creatinine, Ser 0.69 0.50 - 1.10 mg/dL   Calcium 7.6 (L) 8.4 - 10.5 mg/dL   GFR calc non Af Amer >90 >90 mL/min   GFR calc Af Amer >90 >90 mL/min    Comment: (NOTE) The eGFR has been calculated using the CKD EPI equation. This calculation has not been validated in all clinical situations. eGFR's persistently <90 mL/min signify possible Chronic Kidney Disease.    Anion gap 15 5 - 15  Magnesium     Status: None   Collection Time: 03/23/14  3:50 AM  Result Value Ref Range   Magnesium 1.9 1.5 - 2.5 mg/dL  Phosphorus     Status: None   Collection Time: 03/23/14  3:50 AM  Result Value Ref Range   Phosphorus 2.7 2.3 - 4.6 mg/dL  Glucose, capillary     Status: Abnormal   Collection Time: 03/23/14  4:19 AM  Result Value Ref Range   Glucose-Capillary 234 (H) 70 - 99 mg/dL  Glucose, capillary     Status: Abnormal   Collection Time: 03/23/14  7:46 AM  Result Value Ref Range   Glucose-Capillary 232 (H) 70 - 99 mg/dL   Comment 1  Documented in Chart    Comment 2 Notify RN   Glucose, capillary     Status: Abnormal   Collection Time: 03/23/14 12:54 PM  Result Value Ref Range   Glucose-Capillary 221 (H) 70 - 99 mg/dL   Comment 1 Documented in Chart    Comment 2 Notify RN   Glucose, capillary     Status: Abnormal   Collection Time: 03/23/14  4:28 PM  Result Value Ref Range   Glucose-Capillary 188 (H) 70 - 99 mg/dL   Comment 1 Documented in Chart    Comment 2 Notify RN   Glucose, capillary     Status: None   Collection Time: 03/23/14  6:15 PM  Result Value Ref Range   Glucose-Capillary 95 70 - 99 mg/dL   Comment 1 Repeat Test   Glucose, capillary     Status: Abnormal   Collection Time: 03/23/14  9:45 PM  Result Value Ref Range   Glucose-Capillary 120 (H) 70 - 99 mg/dL  CBC     Status: Abnormal   Collection Time: 03/24/14  3:35 AM  Result Value Ref Range   WBC 3.3 (L) 4.0 - 10.5 K/uL   RBC 3.32 (L) 3.87 - 5.11 MIL/uL   Hemoglobin 8.9 (L) 12.0 - 15.0 g/dL   HCT 27.3 (L) 36.0 - 46.0 %   MCV 82.2 78.0 - 100.0 fL   MCH 26.8 26.0 - 34.0 pg   MCHC 32.6 30.0 - 36.0 g/dL   RDW 16.7 (H) 11.5 - 15.5 %   Platelets 91 (L) 150 - 400 K/uL    Comment: CONSISTENT WITH PREVIOUS RESULT REPEATED TO VERIFY   Troponin I     Status: Abnormal   Collection Time: 03/24/14  3:35 AM  Result Value Ref Range   Troponin I 2.28 (HH) <0.30 ng/mL    Comment:        Due to the release kinetics of cTnI, a negative result within the first hours  of the onset of symptoms does not rule out myocardial infarction with certainty. If myocardial infarction is still suspected, repeat the test at appropriate intervals. CRITICAL RESULT CALLED TO, READ BACK BY AND VERIFIED WITH: M.REEVES,RN AT 0415 ON 03/24/14 BY [email or web address]   Basic metabolic panel     Status: Abnormal   Collection Time: 03/24/14  3:35 AM  Result Value Ref Range   Sodium 132 (L) 137 - 147 mEq/L   Potassium 3.8 3.7 - 5.3 mEq/L   Chloride 101 96 - 112 mEq/L   CO2 18 (L) 19 - 32  mEq/L   Glucose, Bld 123 (H) 70 - 99 mg/dL   BUN 38 (H) 6 - 23 mg/dL   Creatinine, Ser 0.97 0.50 - 1.10 mg/dL   Calcium 7.7 (L) 8.4 - 10.5 mg/dL   GFR calc non Af Amer 62 (L) >90 mL/min   GFR calc Af Amer 72 (L) >90 mL/min    Comment: (NOTE) The eGFR has been calculated using the CKD EPI equation. This calculation has not been validated in all clinical situations. eGFR's persistently <90 mL/min signify possible Chronic Kidney Disease.    Anion gap 13 5 - 15  Glucose, capillary     Status: Abnormal   Collection Time: 03/24/14  7:56 AM  Result Value Ref Range   Glucose-Capillary 116 (H) 70 - 99 mg/dL   Comment 1 Documented in Chart    Comment 2 Notify RN   Glucose, capillary     Status: Abnormal   Collection Time: 03/24/14 12:03 PM  Result Value Ref Range   Glucose-Capillary 159 (H) 70 - 99 mg/dL   Comment 1 Documented in Chart    Comment 2 Notify RN   Glucose, capillary     Status: Abnormal   Collection Time: 03/24/14  4:49 PM  Result Value Ref Range   Glucose-Capillary 162 (H) 70 - 99 mg/dL   Comment 1 Documented in Chart    Comment 2 Notify RN   Glucose, capillary     Status: Abnormal   Collection Time: 03/24/14  9:11 PM  Result Value Ref Range   Glucose-Capillary 124 (H) 70 - 99 mg/dL   Comment 1 Documented in Chart    Comment 2 Notify RN   CBC     Status: Abnormal   Collection Time: 03/25/14  4:40 AM  Result Value Ref Range   WBC 3.7 (L) 4.0 - 10.5 K/uL   RBC 3.29 (L) 3.87 - 5.11 MIL/uL   Hemoglobin 9.1 (L) 12.0 - 15.0 g/dL   HCT 27.8 (L) 36.0 - 46.0 %   MCV 84.5 78.0 - 100.0 fL   MCH 27.7 26.0 - 34.0 pg   MCHC 32.7 30.0 - 36.0 g/dL   RDW 17.3 (H) 11.5 - 15.5 %   Platelets 96 (L) 150 - 400 K/uL    Comment: CONSISTENT WITH PREVIOUS RESULT  TSH     Status: Abnormal   Collection Time: 03/25/14  4:40 AM  Result Value Ref Range   TSH 5.080 (H) 0.350 - 4.500 uIU/mL    Comment: Performed at [facility]  T4, free     Status: None   Collection Time: 03/25/14   4:40 AM  Result Value Ref Range   Free T4 0.89 0.80 - 1.80 ng/dL    Comment: Performed at [facility]  Glucose, capillary     Status: Abnormal   Collection Time: 03/25/14  7:56 AM  Result Value Ref Range   Glucose-Capillary 110 (H) 70 -  99 mg/dL   Comment 1 Documented in Chart    Comment 2 Notify RN   Albumin     Status: Abnormal   Collection Time: 03/25/14 11:30 AM  Result Value Ref Range   Albumin 1.5 (L) 3.5 - 5.2 g/dL  Glucose, capillary     Status: Abnormal   Collection Time: 03/25/14 12:41 PM  Result Value Ref Range   Glucose-Capillary 138 (H) 70 - 99 mg/dL   Comment 1 Documented in Chart    Comment 2 Notify RN    Labs are reviewed.  Current Facility-Administered Medications  Medication Dose Route Frequency Provider Last Rate Last Dose  . 0.9 %  sodium chloride infusion   Intravenous Continuous Fay Records, MD 10 mL/hr at 03/25/14 1225    . acetaminophen (TYLENOL) tablet 650 mg  650 mg Oral Q6H PRN Oswald Hillock, MD   650 mg at 03/22/14 2207   Or  . acetaminophen (TYLENOL) suppository 650 mg  650 mg Rectal Q6H PRN Oswald Hillock, MD      . antiseptic oral rinse (CPC / CETYLPYRIDINIUM CHLORIDE 0.05%) solution 7 mL  7 mL Mouth Rinse q12n4p Modena Jansky, MD   7 mL at 03/25/14 1123  . aspirin chewable tablet 81 mg  81 mg Oral Daily Pixie Casino, MD   81 mg at 03/24/14 1026  . atorvastatin (LIPITOR) tablet 20 mg  20 mg Oral q1800 Dorothy Spark, MD   20 mg at 03/24/14 1659  . ceFAZolin (ANCEF) IVPB 2 g/50 mL premix  2 g Intravenous Q8H Mosetta Pigeon, RPH   2 g at 03/25/14 1123  . chlorhexidine (PERIDEX) 0.12 % solution 15 mL  15 mL Mouth Rinse BID Modena Jansky, MD   15 mL at 03/25/14 0908  . chlorpheniramine-HYDROcodone (TUSSIONEX) 10-8 MG/5ML suspension 5 mL  5 mL Oral Q12H PRN Shanda Howells, MD   5 mL at 03/22/14 0133  . collagenase (SANTYL) ointment   Topical Daily Modena Jansky, MD   1 application at 16/10/96 0600  . [START ON 03/26/2014] enoxaparin  (LOVENOX) injection 40 mg  40 mg Subcutaneous Q24H Reyne Dumas, MD      . feeding supplement (ENSURE COMPLETE) (ENSURE COMPLETE) liquid 237 mL  237 mL Oral TID BM Hazle Coca, RD   237 mL at 03/24/14 2110  . feeding supplement (GLUCERNA 1.2 CAL) liquid 1,000 mL  1,000 mL Per Tube Q24H Hazle Coca, RD 20 mL/hr at 03/25/14 1007 1,000 mL at 03/25/14 1007  . fludrocortisone (FLORINEF) tablet 0.1 mg  0.1 mg Oral Daily Raylene Miyamoto, MD   0.1 mg at 03/25/14 0910  . HYDROmorphone (DILAUDID) injection 0.5-1 mg  0.5-1 mg Intravenous Q4H PRN Reyne Dumas, MD   1 mg at 03/25/14 1028  . insulin aspart (novoLOG) injection 0-5 Units  0-5 Units Subcutaneous QHS Modena Jansky, MD   3 Units at 03/22/14 2206  . insulin aspart (novoLOG) injection 0-9 Units  0-9 Units Subcutaneous TID WC Modena Jansky, MD   1 Units at 03/25/14 1249  . insulin glargine (LANTUS) injection 15 Units  15 Units Subcutaneous Daily Rigoberto Noel, MD   15 Units at 03/25/14 0957  . mirtazapine (REMERON SOL-TAB) disintegrating tablet 15 mg  15 mg Oral QHS Durward Parcel, MD   15 mg at 03/24/14 2100  . OLANZapine zydis (ZYPREXA) disintegrating tablet 5 mg  5 mg Oral QHS Durward Parcel, MD   5 mg at  03/24/14 2101  . ondansetron (ZOFRAN) tablet 4 mg  4 mg Oral Q6H PRN Oswald Hillock, MD   4 mg at 03/21/14 1352   Or  . ondansetron (ZOFRAN) injection 4 mg  4 mg Intravenous Q6H PRN Oswald Hillock, MD      . phenylephrine (NEO-SYNEPHRINE) 10 mg in dextrose 5 % 250 mL (0.04 mg/mL) infusion  30-200 mcg/min Intravenous Titrated Raylene Miyamoto, MD   Stopped at 03/23/14 0800  . predniSONE (DELTASONE) tablet 20 mg  20 mg Oral Q breakfast Reyne Dumas, MD   20 mg at 03/25/14 0910  . sodium bicarbonate tablet 650 mg  650 mg Oral TID Modena Jansky, MD   650 mg at 03/25/14 0910  . sodium chloride 0.9 % injection 10-40 mL  10-40 mL Intracatheter Q12H Reyne Dumas, MD   10 mL at 03/25/14 0911  . sodium chloride  0.9 % injection 10-40 mL  10-40 mL Intracatheter PRN Reyne Dumas, MD        Psychiatric Specialty Exam: Physical Exam as per history and physical  ROS decrease appetite   Blood pressure 115/53, pulse 103, temperature 98.1 F (36.7 C), temperature source Oral, resp. rate 13, height 5' 4"  (1.626 m), weight 89.5 kg (197 lb 5 oz), SpO2 92 %.Body mass index is 33.85 kg/(m^2).  General Appearance: Guarded  Eye Contact::  Good  Speech:  Clear and Coherent and Slow  Volume:  Decreased  Mood:  Anxious and Depressed  Affect:  Constricted and Depressed  Thought Process:  Coherent and Goal Directed  Orientation:  Full (Time, Place, and Person)  Thought Content:  WDL  Suicidal Thoughts:  No  Homicidal Thoughts:  No  Memory:  Immediate;   Fair Recent;   Fair  Judgement:  Intact  Insight:  Fair  Psychomotor Activity:  Decreased  Concentration:  Fair  Recall:  [place] of Knowledge:Good  Language: Good  Akathisia:  NA  Handed:  Right  AIMS (if indicated):     Assets:  Communication Skills Desire for Improvement Financial Resources/Insurance Housing Intimacy Leisure Time Resilience Social Support  Sleep:      Musculoskeletal: Strength & Muscle Tone: decreased Gait & Station: unable to stand Patient leans: N/A  Treatment Plan Summary: Daily contact with patient to assess and evaluate symptoms and progress in treatment Medication management  Pending TSH and free T4 levels Patient has capacity to make her own medical decisions and living arrangement Discontinue IVC and sitter as she has no safety concerns at this time Continuese Remeron 15 mg PO Qhs and increase Zyprexa 5 mg PO Qhs for better appetite  [place] Mccadden,JANARDHAHA R. 03/25/2014 1:25 PM

## 2014-03-25 NOTE — Consult Note (Signed)
Psychiatry Consult Follow Up Note  Reason for Consult:  Depression and capacity evaluation Referring Physician:  Dr. [name] is an 58 y.o. female. Total Time spent with patient: 20 minutes  Assessment: AXIS I:  Mood Disorder NOS AXIS II:  Deferred AXIS III:   Past Medical History  Diagnosis Date  . Hypertension   . Diabetes mellitus without complication   . Arthritis   . Coronary artery disease     pt unaware   AXIS IV:  other psychosocial or environmental problems, problems related to social environment and problems with primary support group AXIS V:  51-60 moderate symptoms  Plan: Patient has capacity to make her own medical decisions and living arrangement Continue Remeron 15 mg PO Qhs and increase Zyprexa 5 mg PO Qhs for better appetite No evidence of imminent risk to self or others at present.   Patient does not meet criteria for psychiatric inpatient admission. Supportive therapy provided about ongoing stressors.  Appreciate psychiatric consultation and follow up as clinically required Please contact [phone] or [phone] if needs further assistance  Subjective:   Robin Schroeder is a 58 y.o. female patient admitted with refuses eating and drinking.  HPI:  This is a 58 year old female seen and chart reviewed. Patient has two sister's at bed side when walked into her room, patient asked them to step out for this evaluation. She lives with her husband who is not in hospital at this time. She wishes to see her brother while in hospital. Patient is seen with Sindy Messing, LCSW for psych consultation for capacity evaluation and possible suicidal ideations. Patient stated that she wants straighten up and go home. She has denied depression, anxiety, and psychosis. She has denied suicidal or homicidal ideation, intention or psychosis. She has fair insight into her medical and clinical condition and upset when family concern about her not eating and saying why she  should eat when she was no hungry and stated that she is eating every day as much as she can. \  Interval History: Patient lying down on her bed, receiving TPN, mostly isolated, withdrawn and responds verbally with this brief responses but overall she is a poor historian. She endorses "not feeling good" and has disturbance of sleep and appetite. She has multiple medical problems and going to medical investigation at this time. Staff RN reported that she was taken only few sips of Ensure when offered. She is mostly closing her eyes and responds few words when talk to her. Patient will be closely followed up for appropriate treatment and further assessment.   Past Psychiatric History: Past Medical History  Diagnosis Date  . Hypertension   . Diabetes mellitus without complication   . Arthritis   . Coronary artery disease     pt unaware    reports that she has never smoked. She does not have any smokeless tobacco history on file. She reports that she does not drink alcohol or use illicit drugs. Family History  Problem Relation Age of Onset  . Hypertension Brother      Living Arrangements: Spouse/significant other   Abuse/Neglect [facility]) Physical Abuse: Denies Verbal Abuse: Denies Sexual Abuse: Denies Allergies:   Allergies  Allergen Reactions  . Penicillins Rash    ACT Assessment Complete:  NO Objective: Blood pressure 115/53, pulse 103, temperature 98.1 F (36.7 C), temperature source Oral, resp. rate 13, height 5' 4"  (1.626 m), weight 89.5 kg (197 lb 5 oz), SpO2 92 %.Body mass index  is 33.85 kg/(m^2). Results for orders placed or performed during the hospital encounter of 03/19/14 (from the past 72 hour(s))  Glucose, capillary     Status: Abnormal   Collection Time: 03/22/14  5:32 PM  Result Value Ref Range   Glucose-Capillary 253 (H) 70 - 99 mg/dL   Comment 1 Documented in Chart    Comment 2 Notify RN   Heparin level (unfractionated)     Status: None   Collection Time:  03/22/14  7:43 PM  Result Value Ref Range   Heparin Unfractionated 0.51 0.30 - 0.70 IU/mL    Comment:        IF HEPARIN RESULTS ARE BELOW EXPECTED VALUES, AND PATIENT DOSAGE HAS BEEN CONFIRMED, SUGGEST FOLLOW UP TESTING OF ANTITHROMBIN III LEVELS. Performed at [facility]   Glucose, capillary     Status: Abnormal   Collection Time: 03/22/14  8:06 PM  Result Value Ref Range   Glucose-Capillary 254 (H) 70 - 99 mg/dL  CBC     Status: Abnormal   Collection Time: 03/23/14  3:50 AM  Result Value Ref Range   WBC 7.7 4.0 - 10.5 K/uL   RBC 3.62 (L) 3.87 - 5.11 MIL/uL   Hemoglobin 9.9 (L) 12.0 - 15.0 g/dL   HCT 30.7 (L) 36.0 - 46.0 %   MCV 84.8 78.0 - 100.0 fL   MCH 27.3 26.0 - 34.0 pg   MCHC 32.2 30.0 - 36.0 g/dL   RDW 16.7 (H) 11.5 - 15.5 %   Platelets 108 (L) 150 - 400 K/uL    Comment: CONSISTENT WITH PREVIOUS RESULT  Heparin level (unfractionated)     Status: Abnormal   Collection Time: 03/23/14  3:50 AM  Result Value Ref Range   Heparin Unfractionated 0.75 (H) 0.30 - 0.70 IU/mL    Comment:        IF HEPARIN RESULTS ARE BELOW EXPECTED VALUES, AND PATIENT DOSAGE HAS BEEN CONFIRMED, SUGGEST FOLLOW UP TESTING OF ANTITHROMBIN III LEVELS. Performed at [facility] metabolic panel     Status: Abnormal   Collection Time: 03/23/14  3:50 AM  Result Value Ref Range   Sodium 132 (L) 137 - 147 mEq/L   Potassium 3.9 3.7 - 5.3 mEq/L   Chloride 101 96 - 112 mEq/L   CO2 16 (L) 19 - 32 mEq/L   Glucose, Bld 239 (H) 70 - 99 mg/dL   BUN 30 (H) 6 - 23 mg/dL   Creatinine, Ser 0.69 0.50 - 1.10 mg/dL   Calcium 7.6 (L) 8.4 - 10.5 mg/dL   GFR calc non Af Amer >90 >90 mL/min   GFR calc Af Amer >90 >90 mL/min    Comment: (NOTE) The eGFR has been calculated using the CKD EPI equation. This calculation has not been validated in all clinical situations. eGFR's persistently <90 mL/min signify possible Chronic Kidney Disease.    Anion gap 15 5 - 15  Magnesium     Status:  None   Collection Time: 03/23/14  3:50 AM  Result Value Ref Range   Magnesium 1.9 1.5 - 2.5 mg/dL  Phosphorus     Status: None   Collection Time: 03/23/14  3:50 AM  Result Value Ref Range   Phosphorus 2.7 2.3 - 4.6 mg/dL  Glucose, capillary     Status: Abnormal   Collection Time: 03/23/14  4:19 AM  Result Value Ref Range   Glucose-Capillary 234 (H) 70 - 99 mg/dL  Glucose, capillary     Status: Abnormal  Collection Time: 03/23/14  7:46 AM  Result Value Ref Range   Glucose-Capillary 232 (H) 70 - 99 mg/dL   Comment 1 Documented in Chart    Comment 2 Notify RN   Glucose, capillary     Status: Abnormal   Collection Time: 03/23/14 12:54 PM  Result Value Ref Range   Glucose-Capillary 221 (H) 70 - 99 mg/dL   Comment 1 Documented in Chart    Comment 2 Notify RN   Glucose, capillary     Status: Abnormal   Collection Time: 03/23/14  4:28 PM  Result Value Ref Range   Glucose-Capillary 188 (H) 70 - 99 mg/dL   Comment 1 Documented in Chart    Comment 2 Notify RN   Glucose, capillary     Status: None   Collection Time: 03/23/14  6:15 PM  Result Value Ref Range   Glucose-Capillary 95 70 - 99 mg/dL   Comment 1 Repeat Test   Glucose, capillary     Status: Abnormal   Collection Time: 03/23/14  9:45 PM  Result Value Ref Range   Glucose-Capillary 120 (H) 70 - 99 mg/dL  CBC     Status: Abnormal   Collection Time: 03/24/14  3:35 AM  Result Value Ref Range   WBC 3.3 (L) 4.0 - 10.5 K/uL   RBC 3.32 (L) 3.87 - 5.11 MIL/uL   Hemoglobin 8.9 (L) 12.0 - 15.0 g/dL   HCT 27.3 (L) 36.0 - 46.0 %   MCV 82.2 78.0 - 100.0 fL   MCH 26.8 26.0 - 34.0 pg   MCHC 32.6 30.0 - 36.0 g/dL   RDW 16.7 (H) 11.5 - 15.5 %   Platelets 91 (L) 150 - 400 K/uL    Comment: CONSISTENT WITH PREVIOUS RESULT REPEATED TO VERIFY   Troponin I     Status: Abnormal   Collection Time: 03/24/14  3:35 AM  Result Value Ref Range   Troponin I 2.28 (HH) <0.30 ng/mL    Comment:        Due to the release kinetics of cTnI, a  negative result within the first hours of the onset of symptoms does not rule out myocardial infarction with certainty. If myocardial infarction is still suspected, repeat the test at appropriate intervals. CRITICAL RESULT CALLED TO, READ BACK BY AND VERIFIED WITH: M.REEVES,RN AT 0415 ON 03/24/14 BY [email or web address]   Basic metabolic panel     Status: Abnormal   Collection Time: 03/24/14  3:35 AM  Result Value Ref Range   Sodium 132 (L) 137 - 147 mEq/L   Potassium 3.8 3.7 - 5.3 mEq/L   Chloride 101 96 - 112 mEq/L   CO2 18 (L) 19 - 32 mEq/L   Glucose, Bld 123 (H) 70 - 99 mg/dL   BUN 38 (H) 6 - 23 mg/dL   Creatinine, Ser 0.97 0.50 - 1.10 mg/dL   Calcium 7.7 (L) 8.4 - 10.5 mg/dL   GFR calc non Af Amer 62 (L) >90 mL/min   GFR calc Af Amer 72 (L) >90 mL/min    Comment: (NOTE) The eGFR has been calculated using the CKD EPI equation. This calculation has not been validated in all clinical situations. eGFR's persistently <90 mL/min signify possible Chronic Kidney Disease.    Anion gap 13 5 - 15  Glucose, capillary     Status: Abnormal   Collection Time: 03/24/14  7:56 AM  Result Value Ref Range   Glucose-Capillary 116 (H) 70 - 99 mg/dL   Comment 1 Documented in Chart  Comment 2 Notify RN   Glucose, capillary     Status: Abnormal   Collection Time: 03/24/14 12:03 PM  Result Value Ref Range   Glucose-Capillary 159 (H) 70 - 99 mg/dL   Comment 1 Documented in Chart    Comment 2 Notify RN   Glucose, capillary     Status: Abnormal   Collection Time: 03/24/14  4:49 PM  Result Value Ref Range   Glucose-Capillary 162 (H) 70 - 99 mg/dL   Comment 1 Documented in Chart    Comment 2 Notify RN   Glucose, capillary     Status: Abnormal   Collection Time: 03/24/14  9:11 PM  Result Value Ref Range   Glucose-Capillary 124 (H) 70 - 99 mg/dL   Comment 1 Documented in Chart    Comment 2 Notify RN   CBC     Status: Abnormal   Collection Time: 03/25/14  4:40 AM  Result Value Ref Range   WBC 3.7 (L)  4.0 - 10.5 K/uL   RBC 3.29 (L) 3.87 - 5.11 MIL/uL   Hemoglobin 9.1 (L) 12.0 - 15.0 g/dL   HCT 27.8 (L) 36.0 - 46.0 %   MCV 84.5 78.0 - 100.0 fL   MCH 27.7 26.0 - 34.0 pg   MCHC 32.7 30.0 - 36.0 g/dL   RDW 17.3 (H) 11.5 - 15.5 %   Platelets 96 (L) 150 - 400 K/uL    Comment: CONSISTENT WITH PREVIOUS RESULT  TSH     Status: Abnormal   Collection Time: 03/25/14  4:40 AM  Result Value Ref Range   TSH 5.080 (H) 0.350 - 4.500 uIU/mL    Comment: Performed at [facility]  T4, free     Status: None   Collection Time: 03/25/14  4:40 AM  Result Value Ref Range   Free T4 0.89 0.80 - 1.80 ng/dL    Comment: Performed at [facility]  Glucose, capillary     Status: Abnormal   Collection Time: 03/25/14  7:56 AM  Result Value Ref Range   Glucose-Capillary 110 (H) 70 - 99 mg/dL   Comment 1 Documented in Chart    Comment 2 Notify RN   Albumin     Status: Abnormal   Collection Time: 03/25/14 11:30 AM  Result Value Ref Range   Albumin 1.5 (L) 3.5 - 5.2 g/dL  Glucose, capillary     Status: Abnormal   Collection Time: 03/25/14 12:41 PM  Result Value Ref Range   Glucose-Capillary 138 (H) 70 - 99 mg/dL   Comment 1 Documented in Chart    Comment 2 Notify RN    Labs are reviewed.  Current Facility-Administered Medications  Medication Dose Route Frequency Provider Last Rate Last Dose  . 0.9 %  sodium chloride infusion   Intravenous Continuous Fay Records, MD 10 mL/hr at 03/25/14 1225    . acetaminophen (TYLENOL) tablet 650 mg  650 mg Oral Q6H PRN Oswald Hillock, MD   650 mg at 03/22/14 2207   Or  . acetaminophen (TYLENOL) suppository 650 mg  650 mg Rectal Q6H PRN Oswald Hillock, MD      . antiseptic oral rinse (CPC / CETYLPYRIDINIUM CHLORIDE 0.05%) solution 7 mL  7 mL Mouth Rinse q12n4p Modena Jansky, MD   7 mL at 03/25/14 1123  . aspirin chewable tablet 81 mg  81 mg Oral Daily Pixie Casino, MD   81 mg at 03/24/14 1026  . atorvastatin (LIPITOR) tablet 20 mg  20 mg Oral q1800  Dorothy Spark, MD   20 mg at 03/24/14 1659  . ceFAZolin (ANCEF) IVPB 2 g/50 mL premix  2 g Intravenous Q8H Mosetta Pigeon, RPH   2 g at 03/25/14 1123  . chlorhexidine (PERIDEX) 0.12 % solution 15 mL  15 mL Mouth Rinse BID Modena Jansky, MD   15 mL at 03/25/14 0908  . chlorpheniramine-HYDROcodone (TUSSIONEX) 10-8 MG/5ML suspension 5 mL  5 mL Oral Q12H PRN Shanda Howells, MD   5 mL at 03/22/14 0133  . collagenase (SANTYL) ointment   Topical Daily Modena Jansky, MD   1 application at 00/34/91 0600  . [START ON 03/26/2014] enoxaparin (LOVENOX) injection 40 mg  40 mg Subcutaneous Q24H Reyne Dumas, MD      . feeding supplement (ENSURE COMPLETE) (ENSURE COMPLETE) liquid 237 mL  237 mL Oral TID BM Hazle Coca, RD   237 mL at 03/24/14 2110  . feeding supplement (GLUCERNA 1.2 CAL) liquid 1,000 mL  1,000 mL Per Tube Q24H Hazle Coca, RD 20 mL/hr at 03/25/14 1007 1,000 mL at 03/25/14 1007  . fludrocortisone (FLORINEF) tablet 0.1 mg  0.1 mg Oral Daily Raylene Miyamoto, MD   0.1 mg at 03/25/14 0910  . HYDROmorphone (DILAUDID) injection 0.5-1 mg  0.5-1 mg Intravenous Q4H PRN Reyne Dumas, MD   1 mg at 03/25/14 1028  . insulin aspart (novoLOG) injection 0-5 Units  0-5 Units Subcutaneous QHS Modena Jansky, MD   3 Units at 03/22/14 2206  . insulin aspart (novoLOG) injection 0-9 Units  0-9 Units Subcutaneous TID WC Modena Jansky, MD   1 Units at 03/25/14 1249  . insulin glargine (LANTUS) injection 15 Units  15 Units Subcutaneous Daily Rigoberto Noel, MD   15 Units at 03/25/14 0957  . mirtazapine (REMERON SOL-TAB) disintegrating tablet 15 mg  15 mg Oral QHS Durward Parcel, MD   15 mg at 03/24/14 2100  . OLANZapine zydis (ZYPREXA) disintegrating tablet 5 mg  5 mg Oral QHS Durward Parcel, MD   5 mg at 03/24/14 2101  . ondansetron (ZOFRAN) tablet 4 mg  4 mg Oral Q6H PRN Oswald Hillock, MD   4 mg at 03/21/14 1352   Or  . ondansetron (ZOFRAN) injection 4 mg  4 mg  Intravenous Q6H PRN Oswald Hillock, MD      . phenylephrine (NEO-SYNEPHRINE) 10 mg in dextrose 5 % 250 mL (0.04 mg/mL) infusion  30-200 mcg/min Intravenous Titrated Raylene Miyamoto, MD   Stopped at 03/23/14 0800  . predniSONE (DELTASONE) tablet 20 mg  20 mg Oral Q breakfast Reyne Dumas, MD   20 mg at 03/25/14 0910  . sodium bicarbonate tablet 650 mg  650 mg Oral TID Modena Jansky, MD   650 mg at 03/25/14 0910  . sodium chloride 0.9 % injection 10-40 mL  10-40 mL Intracatheter Q12H Reyne Dumas, MD   10 mL at 03/25/14 0911  . sodium chloride 0.9 % injection 10-40 mL  10-40 mL Intracatheter PRN Reyne Dumas, MD        Psychiatric Specialty Exam: Physical Exam as per history and physical  ROS decrease appetite   Blood pressure 115/53, pulse 103, temperature 98.1 F (36.7 C), temperature source Oral, resp. rate 13, height 5' 4"  (1.626 m), weight 89.5 kg (197 lb 5 oz), SpO2 92 %.Body mass index is 33.85 kg/(m^2).  General Appearance: Guarded  Eye Contact::  Good  Speech:  Clear and Coherent  and Slow  Volume:  Decreased  Mood:  Anxious and Depressed  Affect:  Constricted and Depressed  Thought Process:  Coherent and Goal Directed  Orientation:  Full (Time, Place, and Person)  Thought Content:  WDL  Suicidal Thoughts:  No  Homicidal Thoughts:  No  Memory:  Immediate;   Fair Recent;   Fair  Judgement:  Intact  Insight:  Fair  Psychomotor Activity:  Decreased  Concentration:  Fair  Recall:  [place] of Knowledge:Good  Language: Good  Akathisia:  NA  Handed:  Right  AIMS (if indicated):     Assets:  Communication Skills Desire for Improvement Financial Resources/Insurance Housing Intimacy Leisure Time Resilience Social Support  Sleep:      Musculoskeletal: Strength & Muscle Tone: decreased Gait & Station: unable to stand Patient leans: N/A  Treatment Plan Summary: Daily contact with patient to assess and evaluate symptoms and progress in treatment Medication  management  Reviewed TSH which is 5.080 which is higher and free T4 level is 0.89 which is within normal limits Patient has capacity to make her own medical decisions and living arrangement Continuese Remeron 15 mg PO Qhs and increase Zyprexa 5 mg PO Qhs for better appetite  Robin Schroeder,JANARDHAHA R. 03/25/2014 1:26 PM

## 2014-03-25 NOTE — Progress Notes (Addendum)
Subjective: Denies CP Objective: Filed Vitals:   03/25/14 0030 03/25/14 0400 03/25/14 0800 03/25/14 1029  BP: 106/57 109/50 104/52 110/59  Pulse: 96 86 86 103  Temp:  97.7 F (36.5 C) 98.1 F (36.7 C)   TempSrc:  Axillary Oral   Resp: 16 15 11 17   Height:      Weight:  197 lb 5 oz (89.5 kg)    SpO2: 95% 97% 97% 96%   Weight change: -1 lb 1.6 oz (-0.5 kg)  Intake/Output Summary (Last 24 hours) at 03/25/14 1109 Last data filed at 03/25/14 1000  Gross per 24 hour  Intake 1653.08 ml  Output    360 ml  Net 1293.08 ml    General: Alert, awake, oriented x3, in no acute distress Neck:  Neck is full Heart: Regular rate and rhythm, without murmurs, rubs, gallops.  Lungs: Clear to auscultation.  No rales or wheezes. Exemities:  2-3+ edema.(upper and Lower extremities)   Neuro: Grossly intact, nonfocal.   Lab Results: Results for orders placed or performed during the hospital encounter of 03/19/14 (from the past 24 hour(s))  Glucose, capillary     Status: Abnormal   Collection Time: 03/24/14 12:03 PM  Result Value Ref Range   Glucose-Capillary 159 (H) 70 - 99 mg/dL   Comment 1 Documented in Chart    Comment 2 Notify RN   Glucose, capillary     Status: Abnormal   Collection Time: 03/24/14  4:49 PM  Result Value Ref Range   Glucose-Capillary 162 (H) 70 - 99 mg/dL   Comment 1 Documented in Chart    Comment 2 Notify RN   Glucose, capillary     Status: Abnormal   Collection Time: 03/24/14  9:11 PM  Result Value Ref Range   Glucose-Capillary 124 (H) 70 - 99 mg/dL   Comment 1 Documented in Chart    Comment 2 Notify RN   CBC     Status: Abnormal   Collection Time: 03/25/14  4:40 AM  Result Value Ref Range   WBC 3.7 (L) 4.0 - 10.5 K/uL   RBC 3.29 (L) 3.87 - 5.11 MIL/uL   Hemoglobin 9.1 (L) 12.0 - 15.0 g/dL   HCT 14/09/15 (L) 16.1 - 09.6 %   MCV 84.5 78.0 - 100.0 fL   MCH 27.7 26.0 - 34.0 pg   MCHC 32.7 30.0 - 36.0 g/dL   RDW 04.5 (H) 40.9 - 81.1 %   Platelets 96 (L) 150 -  400 K/uL  TSH     Status: Abnormal   Collection Time: 03/25/14  4:40 AM  Result Value Ref Range   TSH 5.080 (H) 0.350 - 4.500 uIU/mL  T4, free     Status: None   Collection Time: 03/25/14  4:40 AM  Result Value Ref Range   Free T4 0.89 0.80 - 1.80 ng/dL  Glucose, capillary     Status: Abnormal   Collection Time: 03/25/14  7:56 AM  Result Value Ref Range   Glucose-Capillary 110 (H) 70 - 99 mg/dL   Comment 1 Documented in Chart    Comment 2 Notify RN     Studies/Results: Echo COnclusions 12/4 Left ventricle: The cavity size was normal. Wall thickness was normal. Systolic function was normal. The estimated ejection fraction was in the range of 50% to 55%. There is hypokinesis of the apicalinferior and apical myocardium. Doppler parameters are consistent with abnormal left ventricular relaxation (grade 1 diastolic dysfunction). - Mitral valve: There was mild regurgitation. - Left  atrium: The atrium was mildly dilated. - Pulmonary arteries: Systolic pressure was mildly increased.  No results found.   Medications: Reviewed   @PROBHOSP @  1.  Cardiac  Echo 12/4 showed LVEF low normal with focal wall motion abnormality  May still have been related to sepsis. For now would provide supportive care  She is pain free   BP limits Rx   Severe volume overload but third spacing  Note BUN/Cr have bumped.  Would hold lasix for now  To get albumin  Should have ischemic eval (cath) when medical problems resolve]  2.  ID  MRI done  Aspiration of knee later today    Patinet remains critcially ill    LOS: 6 days   14/4 03/25/2014, 11:09 AM

## 2014-03-26 LAB — CBC
HEMATOCRIT: 23.9 % — AB (ref 36.0–46.0)
HEMOGLOBIN: 7.8 g/dL — AB (ref 12.0–15.0)
MCH: 28 pg (ref 26.0–34.0)
MCHC: 32.6 g/dL (ref 30.0–36.0)
MCV: 85.7 fL (ref 78.0–100.0)
Platelets: 79 10*3/uL — ABNORMAL LOW (ref 150–400)
RBC: 2.79 MIL/uL — ABNORMAL LOW (ref 3.87–5.11)
RDW: 18 % — ABNORMAL HIGH (ref 11.5–15.5)
WBC: 2.9 10*3/uL — ABNORMAL LOW (ref 4.0–10.5)

## 2014-03-26 LAB — COMPREHENSIVE METABOLIC PANEL
ALK PHOS: 426 U/L — AB (ref 39–117)
ALT: <5 U/L (ref 0–35)
ANION GAP: 12 (ref 5–15)
AST: 52 U/L — ABNORMAL HIGH (ref 0–37)
Albumin: 1.3 g/dL — ABNORMAL LOW (ref 3.5–5.2)
BUN: 52 mg/dL — AB (ref 6–23)
CALCIUM: 7.5 mg/dL — AB (ref 8.4–10.5)
CO2: 20 mEq/L (ref 19–32)
Chloride: 106 mEq/L (ref 96–112)
Creatinine, Ser: 1.26 mg/dL — ABNORMAL HIGH (ref 0.50–1.10)
GFR, EST AFRICAN AMERICAN: 53 mL/min — AB (ref >90)
GFR, EST NON AFRICAN AMERICAN: 45 mL/min — AB (ref >90)
GLUCOSE: 103 mg/dL — AB (ref 70–99)
Potassium: 3.5 mEq/L — ABNORMAL LOW (ref 3.7–5.3)
Sodium: 138 mEq/L (ref 137–147)
Total Bilirubin: 0.8 mg/dL (ref 0.3–1.2)
Total Protein: 5 g/dL — ABNORMAL LOW (ref 6.0–8.3)

## 2014-03-26 LAB — GLUCOSE, CAPILLARY
GLUCOSE-CAPILLARY: 69 mg/dL — AB (ref 70–99)
Glucose-Capillary: 95 mg/dL (ref 70–99)
Glucose-Capillary: 88 mg/dL (ref 70–99)
Glucose-Capillary: 101 mg/dL — ABNORMAL HIGH (ref 70–99)

## 2014-03-26 LAB — PHOSPHORUS: Phosphorus: 4.3 mg/dL (ref 2.3–4.6)

## 2014-03-26 LAB — MAGNESIUM: MAGNESIUM: 2.2 mg/dL (ref 1.5–2.5)

## 2014-03-26 MED ORDER — ALBUMIN HUMAN 25 % IV SOLN
12.5000 g | Freq: Once | INTRAVENOUS | Status: AC
  Administered 2014-03-26: 12.5 g via INTRAVENOUS
  Filled 2014-03-26: qty 50

## 2014-03-26 MED ORDER — POTASSIUM CHLORIDE IN NACL 20-0.9 MEQ/L-% IV SOLN
INTRAVENOUS | Status: DC
  Administered 2014-03-26 – 2014-03-28 (×4): via INTRAVENOUS
  Filled 2014-03-26 (×6): qty 1000

## 2014-03-26 MED ORDER — BISACODYL 10 MG RE SUPP: 10.0000 mg | Freq: Once | RECTAL | Status: DC

## 2014-03-26 MED ORDER — SODIUM CHLORIDE 0.9 % IV SOLN: INTRAVENOUS | Status: DC

## 2014-03-26 MED ORDER — DEXTROSE 50 % IV SOLN
INTRAVENOUS | Status: AC
  Administered 2014-03-26: 25 mL
  Filled 2014-03-26: qty 50

## 2014-03-26 NOTE — Progress Notes (Signed)
[facility]    Date of Admission:  03/19/2014   Total days of antibiotics 8        Day 6 cefazolin           ID: Robin Schroeder is a 58 y.o. female with recurrent MSSA bacteremia c/b STEMI, AKI  Active Problems:   DM2 (diabetes mellitus, type 2)   Physical deconditioning   Hypoalbuminemia   Hyponatremia   Severe sepsis   Acute UTI   Protein-calorie malnutrition, severe   Sacral decubitus ulcer   Acute pulmonary edema   Effusion into joint   ST elevation myocardial infarction (STEMI) of inferolateral wall, initial episode of care   Severe sepsis with septic shock   Diabetic ulcer of left foot associated with diabetes mellitus due to underlying condition   Osteomyelitis   Ulcer of heel   Acute osteomyelitis of femur   Septic arthritis of knee, left   STEMI (ST elevation myocardial infarction)   Ulcer of left heel   Metabolic acidosis   Bacteremia   Knee pain    Subjective: Afebrile,  Still diffusely tender to all joints but mildly improved with steroids. Has had poor po intake with ng in placed for tube feeds. She underwent bilateral knee arthrocentesis which showed  40,000 cells 88%N and 11,050 95% N and no cells. Cloudy with rBC  Medications:  . antiseptic oral rinse  7 mL Mouth Rinse q12n4p  . aspirin  81 mg Oral Daily  . atorvastatin  20 mg Oral q1800  . bisacodyl  10 mg Rectal Once  .  ceFAZolin (ANCEF) IV  2 g Intravenous Q8H  . chlorhexidine  15 mL Mouth Rinse BID  . collagenase   Topical Daily  . dextrose      . enoxaparin (LOVENOX) injection  40 mg Subcutaneous Q24H  . feeding supplement (ENSURE COMPLETE)  237 mL Oral TID BM  . feeding supplement (GLUCERNA 1.2 CAL)  1,000 mL Per Tube Q24H  . fludrocortisone  0.1 mg Oral Daily  . insulin aspart  0-5 Units Subcutaneous QHS  . insulin aspart  0-9 Units Subcutaneous TID WC  . insulin glargine  15 Units Subcutaneous Daily  . mirtazapine  15 mg Oral QHS  . OLANZapine zydis  5 mg  Oral QHS  . predniSONE  20 mg Oral Q breakfast  . sodium bicarbonate  650 mg Oral TID  . sodium chloride  10-40 mL Intracatheter Q12H    Objective: Vital signs in last 24 hours: Temp:  [97.6 F (36.4 C)-98.4 F (36.9 C)] 97.8 F (36.6 C) (12/10 1413) Pulse Rate:  [83-114] 101 (12/10 1413) Resp:  [11-27] 11 (12/10 1200) BP: (99-136)/(42-61) 123/61 mmHg (12/10 1413) SpO2:  [95 %-98 %] 97 % (12/10 1413) Weight:  [201 lb 11.5 oz (91.5 kg)] 201 lb 11.5 oz (91.5 kg) (12/10 0422) Physical Exam  Constitutional: . appears anasarcic, disheveled. Awakens to voice answering more questions today HENT: ng in place Mouth/Throat: Oropharynx is dry. No oropharyngeal exudate. Poor dentition Cardiovascular: Normal rate, regular rhythm and normal heart sounds. Exam reveals no gallop and no friction rub.  No murmur heard.  Pulmonary/Chest: Effort normal and breath sounds normal. No respiratory distress.  has no wheezes.  Abdominal: Soft. Bowel sounds are normal.  exhibits no distension. There is no tenderness.  Lymphadenopathy: no cervical adenopathy.  Neurological: alert and oriented to person, place. She raises right arm and right elbow and but unable to do so with left  arm Skin: heel ulcer to medial aspect of left leg. echymosis to right foot sole, decub to right calf Ext: diffusely anasarca to L> R arm, but also anasarca to all extremities. Psychiatric: flat affect,  answering qeustions today more so than yesterday or day prior  Lab Results  Recent Labs  03/24/14 0335 03/25/14 0440 03/26/14 0421  WBC 3.3* 3.7* 2.9*  HGB 8.9* 9.1* 7.8*  HCT 27.3* 27.8* 23.9*  NA 132*  --  138  K 3.8  --  3.5*  CL 101  --  106  CO2 18*  --  20  BUN 38*  --  52*  CREATININE 0.97  --  1.26*   Lab Results  Component Value Date   ESRSEDRATE 90* 03/21/2014   Lab Results  Component Value Date   CRP 16.1* 03/21/2014    Microbiology: 12/3 blood cx x 2 MSSA 12/4 blood cx NGTD  Studies/Results: Dg  Abd Portable 1v  03/25/2014   CLINICAL DATA:  Feeding tube placement.  EXAM: PORTABLE ABDOMEN - 1 VIEW  COMPARISON:  None.  FINDINGS: Distal tip of feeding tube is seen in the expected position of the proximal stomach. Visualized bowel appears normal.  IMPRESSION: Distal tip of feeding tube is seen in expected position of the proximal stomach.   Electronically Signed   By: Roque Lias M.D.   On: 03/25/2014 09:56   Dg Fluoro Guide Ndl Plc/bx  03/26/2014   CLINICAL DATA:  58 year old female with recent hospitalization with MSSA bacteremia of unknown source and possible rheumatoid arthritis. She is currently admitted with sepsis of unclear source requiring pressors. Possible osteomyelitis of the medial left knee on x-ray. Bilateral knee joint aspirations are requested to evaluate for infectious source  EXAM: FLUOROSCOPICALLY GUIDED BILATERAL KNEE JOINT ASPIRATIONS.  FLUOROSCOPY TIME:  17 seconds  PROCEDURE: After a thorough discussion of risks and benefits of the procedure including the general risks of bleeding, infection, injury to nerves, blood vessels, adjacent structures, and allergic reaction, verbal and written consent was obtained. Verbal consent was obtained by Dr. Archer Asa. Specific risks of this procedure included false positive and false negative results and introduction of infection into the joint. Time-out form was completed when appropriate.  Attention was first turned to the left knee.  The joint was localized under fluoroscopy. A skin entry site medial to the joint was chosen and marked. The skin was prepped and draped in the usual sterile fashion with Betadine soap. Local anesthesia and deep anesthesia was provided with 1% lidocaine without epinephrine. Careful attention was paid not to inject bacteriostatic lidocaine into the joint. Under fluoroscopic guidance an 18 gauge spinal needle was advanced into the joint. Location within the suprapatellar recess was confirmed by a gentle hand  injection of 3 mL Omnipaque 300. Vigorous aspiration was performed which yielded approximately 5 cc of bloody synovial fluid.  The needle was removed and a sterile dressing applied.  Attention was next turned to the right knee.  The joint was localized under fluoroscopy. A skin entry site lateral to the joint was chosen and marked. The skin was prepped and draped in the usual sterile fashion with Betadine soap. Local anesthesia and deep anesthesia was provided with 1% lidocaine without epinephrine. Careful attention was paid not to inject bacteriostatic lidocaine into the joint. Under fluoroscopic guidance an 18 gauge spinal needle was advanced into the joint. Location within the suprapatellar recess was confirmed by a gentle hand injection of 3 mL Omnipaque 300. Vigorous aspiration was performed which  yielded approximately 5 cc of bloody synovial fluid.  Both synovial fluid samples were sent for Gram stain and culture.  IMPRESSION: Technically successful fluoroscopic guided aspiration of the bilateral knee joints.   Electronically Signed   By: Malachy Moan M.D.   On: 03/26/2014 08:39   Dg Fluoro Guide Ndl Plc/bx  03/26/2014   CLINICAL DATA:  58 year old female with recent hospitalization with MSSA bacteremia of unknown source and possible rheumatoid arthritis. She is currently admitted with sepsis of unclear source requiring pressors. Possible osteomyelitis of the medial left knee on x-ray. Bilateral knee joint aspirations are requested to evaluate for infectious source  EXAM: FLUOROSCOPICALLY GUIDED BILATERAL KNEE JOINT ASPIRATIONS.  FLUOROSCOPY TIME:  17 seconds  PROCEDURE: After a thorough discussion of risks and benefits of the procedure including the general risks of bleeding, infection, injury to nerves, blood vessels, adjacent structures, and allergic reaction, verbal and written consent was obtained. Verbal consent was obtained by Dr. Archer Asa. Specific risks of this procedure included false  positive and false negative results and introduction of infection into the joint. Time-out form was completed when appropriate.  Attention was first turned to the left knee.  The joint was localized under fluoroscopy. A skin entry site medial to the joint was chosen and marked. The skin was prepped and draped in the usual sterile fashion with Betadine soap. Local anesthesia and deep anesthesia was provided with 1% lidocaine without epinephrine. Careful attention was paid not to inject bacteriostatic lidocaine into the joint. Under fluoroscopic guidance an 18 gauge spinal needle was advanced into the joint. Location within the suprapatellar recess was confirmed by a gentle hand injection of 3 mL Omnipaque 300. Vigorous aspiration was performed which yielded approximately 5 cc of bloody synovial fluid.  The needle was removed and a sterile dressing applied.  Attention was next turned to the right knee.  The joint was localized under fluoroscopy. A skin entry site lateral to the joint was chosen and marked. The skin was prepped and draped in the usual sterile fashion with Betadine soap. Local anesthesia and deep anesthesia was provided with 1% lidocaine without epinephrine. Careful attention was paid not to inject bacteriostatic lidocaine into the joint. Under fluoroscopic guidance an 18 gauge spinal needle was advanced into the joint. Location within the suprapatellar recess was confirmed by a gentle hand injection of 3 mL Omnipaque 300. Vigorous aspiration was performed which yielded approximately 5 cc of bloody synovial fluid.  Both synovial fluid samples were sent for Gram stain and culture.  IMPRESSION: Technically successful fluoroscopic guided aspiration of the bilateral knee joints.   Electronically Signed   By: Malachy Moan M.D.   On: 03/26/2014 08:39    Assessment/Plan: Recurrent MSSA bacteremia but also has numerous pressure ulcers, unable to mobilize due to deconditioning and polyarthritis from  untreated rheumatoid arthritis,(newly diagnosed in Oct 2015)  MSSA bacteremia = still need to determine source of infection. Due to ACS, she is unable to undergo any extensive sedation for TEE or MRI.  treat for extended course with 6 wk with cefazolin 2gm IV q8hr for now using 12/4 as day 1 of 32. Await cx from arthrocentesis of knees to see if she has septic arthritis. Cell counts suggestive of inflammation in one knee.  Can probably do  foot/heel MRI to see if signs of osteomyelitis. There is prn dilaudid to treat pain and would not need sedation for foot mri.     aki = For now keep on renally dosed cefazolin, she  continues to have AKI, cr up to 1.2 with her baseline at 0.3.  appears to have prerenal azotemia but also possibly due to vanco toxicity since vanco trough was 32 on 12/6. Will need renally dosed regimen of her medications.   She is intravascularly depleted. Consider bolus   Anasarca  2/2 protein caloric malnutrition= continue with TF today. Her malnutrition probably occurred over a period of a few months. I am concern that her lack of appetite is likely due to depression or other mental illness  Pressure ulcer = continue with local wound care  Newly diagnosed Rheumatoid Arthritis = would recommend to do higher doses of prednisone 20 mg daily to see if she has temporary relief of joint pain, possibly increased appetite. I suspect her immobility is in part due other polyarthritis from RA.   - recommend to get case management started on medicaid app so that she may have resources and access to care when she gets discharged  ACS/STEMI= in setting of sepsis from MSSA. Will defer to cardiology for management. Echo report shows LVEF 50 to 55% with distal inferior and apical hypokinesis that is new.  Depression =  TSH and FT4 are more or less within normal limits. Appreciated Dr. Shela Commons input on management  Selma Mink, [facility] Cell:  [phone] Pager: [phone]  03/26/2014, 5:28 PM

## 2014-03-26 NOTE — Plan of Care (Signed)
Problem: Consults Goal: Nutrition Consult-if indicated Outcome: Completed/Met Date Met:  03/26/14     

## 2014-03-26 NOTE — Progress Notes (Addendum)
NUTRITION FOLLOW-UP  INTERVENTION: -Continue to advance Glucerna 1.2 by 10 ml every 10 hours to goal rate of 65 ml/hr; can modify advancement rate pending tolerance and refeeding labs -Recommend refeeding labs (Phos/K/Mg) for 48 hours -RD to continue to monitor  NUTRITION DIAGNOSIS: Inadequate oral intake related to decreased appetite/refusal of meals as evidenced by PO intake < 75% > one month, progressing.  Goal: Pt to meet >/= 90% of their estimated nutrition needs, unmet    Monitor:  Total protein/energy intake, labs, weights, supplement acceptance, skin integrity   Admitting Dx: Septic shock secondary to staph bacteremia  ASSESSMENT: 58 year old female with multiple medical problems who was discharged from the hospital in October after she was treated for MSSA bacteremia with sepsis.  As per patient's husband, patient did not want to eat or drink and she is not eating and drinking for past 5 days and so he got concerned that she wants to hurt herself. So he filled out IVC and patient was brought by the EMS.  Pt reports eating better today, NT agreed. Pt is taking small sips and bites of meals, PO improved. Pt states that she likes the Glucerna shakes and was drinking one during visit.  RD encouraged pt to eat as much as tolerated to promote wound healing.   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 Recommended use of TF vs TPN; however it is likely pt will refuse TF access. Husband verbalized understanding and planned on discussing with pt.  Encouraged nutrition to assist with anasarca, skin integrity and improve deconditioning -Discussed options with RN. RN in agreement, however also concerned with pt's acceptance of NG/NJ tube -Followed up with pt; however pt's husband  not in room -Attempted to contact husband; however was unable to reach. Left voicemail. -RD to follow up tomorrow for decision. Pharmacy in agreement to hold of TPN until decision is made by family -Modified supplement to Ensure as husband believed that she might be more agreeable to different flavors  12/09 -Pt agreeable to initiate TF -NG tube being placed by RN at time of RD follow up -Discussed pt with pharmacy, PharmD noted placement of feeding tube being confirmed by x-ray with plan to start TF as soon as placement confirmed -Modified recommendation to Glucerna 1.2 d/t hx of DM2 -Recommend refeeding labs and conservative advancement, advancement rate can be increased pending lab results  12/10: -RN reported pt tolerating Glucerna 1.2 at 20 ml/hr; however when tube feed was advanced to 30 ml/hr, pt experienced nausea and vomiting. Residuals were 160 ml. RN paged MD, and was instructed to hold TF -Followed up with RN, who reported pt received anti-emetics, and was able to tolerate restarting TF at 20 ml/hr. Pt attributed nausea to oral medication/rinse -Current PO intake 0%, RN noted pt refusing meals and supplements. -Phos/Mg WNL -K Low -Continue with conservative advancement until pt able to tolerate >50% of tube feeding goal rate; replete as needed  Height: Ht Readings from Last 1 Encounters:  03/19/14 5\' 4"  (1.626 m)    Weight: Wt Readings from Last 1 Encounters:  03/26/14 201 lb 11.5 oz (91.5 kg)   BMI:  Body mass index is 34.61 kg/(m^2).  Estimated Nutritional Needs: Kcal: 1800-2000 Protein: 100-110 gram Fluid: per MD  Skin: stg 2 pressure ulcer on heel and arm, deep tissue injury  on heel, +4 generalized edema  Diet Order: Diet Carb Modified  EDUCATION NEEDS: -No education needs identified at this time   Intake/Output Summary (Last 24 hours) at 03/26/14 1518 Last data filed at 03/26/14 1350  Gross per 24 hour  Intake   1205 ml  Output    425 ml  Net    780  ml    Last BM: 12/5  Labs:   Recent Labs Lab 03/22/14 0314 03/23/14 0350 03/24/14 0335 03/26/14 0421  NA 133* 132* 132* 138  K 4.0 3.9 3.8 3.5*  CL 103 101 101 106  CO2 18* 16* 18* 20  BUN 24* 30* 38* 52*  CREATININE 0.48* 0.69 0.97 1.26*  CALCIUM 7.4* 7.6* 7.7* 7.5*  MG 1.8 1.9  --  2.2  PHOS 1.9* 2.7  --  4.3  GLUCOSE 276* 239* 123* 103*    CBG (last 3)   Recent Labs  03/25/14 2117 03/26/14 0750 03/26/14 1158  GLUCAP 122* 95 88    Scheduled Meds: . antiseptic oral rinse  7 mL Mouth Rinse q12n4p  . aspirin  81 mg Oral Daily  . atorvastatin  20 mg Oral q1800  . bisacodyl  10 mg Rectal Once  .  ceFAZolin (ANCEF) IV  2 g Intravenous Q8H  . chlorhexidine  15 mL Mouth Rinse BID  . collagenase   Topical Daily  . enoxaparin (LOVENOX) injection  40 mg Subcutaneous Q24H  . feeding supplement (ENSURE COMPLETE)  237 mL Oral TID BM  . feeding supplement (GLUCERNA 1.2 CAL)  1,000 mL Per Tube Q24H  . fludrocortisone  0.1 mg Oral Daily  . insulin aspart  0-5 Units Subcutaneous QHS  . insulin aspart  0-9 Units Subcutaneous TID WC  . insulin glargine  15 Units Subcutaneous Daily  . mirtazapine  15 mg Oral QHS  . OLANZapine zydis  5 mg Oral QHS  . predniSONE  20 mg Oral Q breakfast  . sodium bicarbonate  650 mg Oral TID  . sodium chloride  10-40 mL Intracatheter Q12H    Continuous Infusions: . 0.9 % NaCl with KCl 20 mEq / L 75 mL/hr at 03/26/14 0839  . phenylephrine (NEO-SYNEPHRINE) Adult infusion Stopped (03/23/14 0800)    Lloyd Huger MS RD LDN Clinical Dietitian Pager:[phone]

## 2014-03-26 NOTE — Plan of Care (Signed)
Problem: Phase II Progression Outcomes Goal: Vital signs remain stable Outcome: Completed/Met Date Met:  03/26/14     

## 2014-03-26 NOTE — Progress Notes (Addendum)
PROGRESS NOTE    Robin Schroeder EUM:[id number] DOB: 1955/11/14 DOA: 03/19/2014 PCP: No PCP Per Patient  HPI/Brief narrative 58 year old female patient with history of hypertension, obesity, DM, chronic diastolic CHF, possible rheumatoid arthritis, recent hospitalization for MSSA bacteremia of unknown source/neg TEE- cleared after Vancomycin (PCN allergy), went to SNF and then discharged from their to home, presented with complaints of not eating or drinking 4 days and husband was concerned that patient was trying to hurt herself and she was IVC and brought to ED where she was assessed as sepsis of unclear source and admitted to stepdown for further management. Patient went into septic shock requiring pressors. She also developed STEMI but poor overall candidate for aggressive intervention i.e. Currently. CCM, ID and cardiology assisting with care. Shock resolved- pressors DC'ed 12/7 AM.   Assessment/Plan:  1. Septic shock secondary to MSSA bacteremia: Continue cefazolin. Source not clear-? Status post arthrocentesis. Cell count from B/L knee synovial fluid consistent with inflammatory arthritis, culture pending. Infectious disease follow-up appreciated. . Shock improved/resolved and DC'd pressors 12/7 AM. Discontinued IV hydrocortisone (tapering dose), continue Fludrocortisone. As per ID, will eventually need repeat TEE when medically stable. Obtain MRI of left knee,foot ,ankle , attempt to do this under no sedation today.if not tolerated then Needs conscious sedation,. Surveillance blood cultures 2 (12/4): Negative to date.  PICC line  placed after ID  Approval.   2. ? Osteomyelitis of medial left knee on x-ray: ID follow-up appreciated. MRI of left knee when stable. .   3. Left lower lobe airspace disease: Possibly chronic.? Doubt that the patient has pneumonia.    4.  hyponatremia: Resolved.    5. Inferiolateral wall STEMI: Patient has not complained of chest pain since admission.  Troponins steadily increased to >20 on 12/3. Not candidate for aggressive intervention i.e. cath secondary to unstable status from shock, anemia and thrombocytopenia. Patient treated with 48 hours of IV heparin-DC'd 12/7 and aspirin-tolerating. Will eventually need cardiac catheterization when stable. 2-D echo shows EF of 50-55% the distal inferior and apical hypokinesis that is new.. .  Cards will clear her for MRI as conscious sedation is needed, also determine timing of TEE .   6. IVC/?? Intention to self harm: IVC removed . Psychiatry consultation note reviewed . Discussed with Dr. Tenny Craw 12/5. Patient has capacity to make her own medical decisions and living arrangement. Meds adjusted by psyche . No evidence of imminent risk to self. Sitter discontinued.   7. Anasarca: Secondary to ongoing illnesses, malnutrition and hypo-albuminemia. Treat underlying cause and attempt to improve nutritional status. Tube feeding held  because of nausea. Can restart tube feeding after 4-6 hours if nausea resolved.   8. Right sacral decubitus: Does not appear grossly infected. Wound care consultation requested. 9.  10. Hypertension: Patient in septic shock. Shock is improving.                                                                                         11. Type II DM: Uncontrolled. Likely worsened by steroids. Continue  Lantus-  continue SSI.   12. Possible rheumatoid arthritis: Patient was on prednisone at home.  Patient placed on 20 mg of prednisone. RA related pain was probably the reason she had been bed bound since recent DC from SNF. Recommend an OP Rheumatology consultation appointment be set up for her prior to DC- this can be done when she is stable and approaching DC.   13. Chronic diastolic CHF: Generalized anasarca but probably intravascularly dehydrated due to third spacing.  14. Hypokalemia: Replaced. 15. Anemia: Acute on chronic Unclear etiology. Baseline hemoglobin probably in the 9  g per DL range. Status post 1 unit PRBC 12/4 with appropriate response. Hemoglobin trending down, continue to monitor.   16. Thrombocytopenia: Possibly from acute illness/sepsis. Continues to improve. Follow CBCs.   17. Multiple pressure wounds: Details as per WOC nurse note on 12/4. Patient has left heel DTI, right arm full-thickness abrasion and unstageable right buttock wound. 18. Failure to thrive: Multifactorial 19. Severe deconditioning: PT and OT evaluation unable. 20. Severe protein calorie malnutrition: Dietitian consulted. Being started on enteral feeding today. Follow labs to monitor for refeeding syndrome  21. Depressive disorder secondary to general medical condition: Management per psychiatry. zyprexa  added. Requested psychiatry to reassess.    22. Oliguria renal failure-improving after transfusion of albumin : Likely secondary to third spacing of fluids in the setting of severe protein calorie malnutrition and intravascular dehydration. . Will give another unit of albumin today.   23. E Coli UTI: Continue Ancef- sensitive.   24. Mild non-anion gap metabolic acidosis: Unclear etiology. Start by mouth bicarbonate. Follow BMP in a.m.    Code Status: Limited code Family Communication: discussed with spouse in detail on 03/23/14. Disposition Plan: Transfer to telemetry today   Consultants:  Cardiology  ID  Psychiatry  PCCM-Signed off  Procedures:  Arterial line-discontinued  Foley catheter  Antibiotics:  IV Vanc 12/3>DC'd   IV Aztreonam 12/3> 12/4   IV Ancef.  Subjective: Denies complaints. Denies suicidal ideations-however nurses concerned regarding patient's statements. Complains of pain in both legs but no specific location. Denies dyspnea or chest pain.    Objective: Filed Vitals:   03/26/14 0302 03/26/14 0400 03/26/14 0422 03/26/14 0800  BP:  107/42  130/57  Pulse:  83  107  Temp: 97.8 F (36.6 C)   98.3 F (36.8 C)  TempSrc: Oral   Oral   Resp:  12  16  Height:      Weight:   91.5 kg (201 lb 11.5 oz)   SpO2:  95%  96%   blood pressures actually better than listed bowel-as discussed with nursing systolic in the 100s and map 73   Intake/Output Summary (Last 24 hours) at 03/26/14 1122 Last data filed at 03/26/14 1000  Gross per 24 hour  Intake 1162.08 ml  Output    435 ml  Net 727.08 ml   Filed Weights   03/24/14 0400 03/25/14 0400 03/26/14 0422  Weight: 90 kg (198 lb 6.6 oz) 89.5 kg (197 lb 5 oz) 91.5 kg (201 lb 11.5 oz)     Exam:  General exam: Pleasant middle-aged female, moderately built and obese lying comfortably supine in bed. Respiratory system: Slightly diminished breath sounds in the bases but otherwise clear to auscultation. No increased work of breathing. Cardiovascular system: S1 & S2 heard, RRR. No JVD, murmurs, gallops, clicks. Anasarca. Telemetry: SB in the 50s-SR. Gastrointestinal system: Abdomen is nondistended, soft and nontender. Normal bowel sounds heard. Patient has patchy ecchymosis over right upper quadrant. Central nervous system: Alert and oriented 3. No focal neurological deficits. Extremities: Symmetric 5 x  5 power. Left knewithout acute findings today. Skin: Dry appearing right sacral decubitus without signs of overt infection. Patient has skin tear over the right posterior upper arm. Left heel DTI without signs of acute infection. Psychiatry: Pleasant and comfortable today. Denies suicidal ideations.   Data Reviewed: Basic Metabolic Panel:  Recent Labs Lab 03/20/14 0820 03/21/14 0357 03/22/14 0314 03/23/14 0350 03/24/14 0335 03/26/14 0421  NA  --  135* 133* 132* 132* 138  K  --  3.7 4.0 3.9 3.8 3.5*  CL  --  105 103 101 101 106  CO2  --  21 18* 16* 18* 20  GLUCOSE  --  218* 276* 239* 123* 103*  BUN  --  15 24* 30* 38* 52*  CREATININE  --  0.25* 0.48* 0.69 0.97 1.26*  CALCIUM  --  7.4* 7.4* 7.6* 7.7* 7.5*  MG 1.6  --  1.8 1.9  --  2.2  PHOS  --   --  1.9* 2.7  --  4.3    Liver Function Tests:  Recent Labs Lab 03/19/14 1508 03/20/14 0108 03/21/14 0357 03/25/14 1130 03/26/14 0421  AST 46* 60* 72*  --  52*  ALT --  <5  ALKPHOS 219* 157* 203*  --  426*  BILITOT 1.6* 1.0 1.0  --  0.8  PROT 6.6 4.9* 5.4*  --  5.0*  ALBUMIN 1.2* 0.9* 1.0* 1.5* 1.3*   No results for input(s): LIPASE, AMYLASE in the last 168 hours. No results for input(s): AMMONIA in the last 168 hours. CBC:  Recent Labs Lab 03/19/14 1508  03/21/14 0357 03/22/14 0314 03/23/14 0350 03/24/14 0335 03/25/14 0440 03/26/14 0421  WBC 4.2  < > 5.4 6.7 7.7 3.3* 3.7* 2.9*  NEUTROABS 3.8  --  4.4  --   --   --   --   --   HGB 7.9*  < > 8.7* 9.7* 9.9* 8.9* 9.1* 7.8*  HCT 24.7*  < > 26.9* 30.2* 30.7* 27.3* 27.8* 23.9*  MCV 84.3  < > 84.3 84.4 84.8 82.2 84.5 85.7  PLT 77*  < > 81* 113* 108* 91* 96* 79*  < > = values in this interval not displayed. Cardiac Enzymes:  Recent Labs Lab 03/19/14 1944 03/20/14 0108 03/20/14 0820 03/24/14 0335  TROPONINI 0.82* 7.66* >20.00* 2.28*   BNP (last 3 results) No results for input(s): PROBNP in the last 8760 hours. CBG:  Recent Labs Lab 03/25/14 0756 03/25/14 1241 03/25/14 1557 03/25/14 2117 03/26/14 0750  GLUCAP 110* 138* 118* 122* 95    Recent Results (from the past 240 hour(s))  Urine culture     Status: None   Collection Time: 03/19/14  3:04 PM  Result Value Ref Range Status   Specimen Description URINE, CATHETERIZED  Final   Special Requests NONE  Final   Culture  Setup Time   Final    03/20/2014 00:29 Performed at [facility] Count   Final    >=100,000 COLONIES/ML Performed at [facility]    Culture   Final    ESCHERICHIA COLI Note: Two isolates with different morphologies were identified as the same organism.The most resistant organism was reported. Performed at [facility]    Report Status 03/23/2014 FINAL  Final   Organism ID, Bacteria ESCHERICHIA COLI  Final       Susceptibility   Escherichia coli - MIC*    AMPICILLIN >=32 RESISTANT Resistant     CEFAZOLIN <=  4 SENSITIVE Sensitive     CEFTRIAXONE <=1 SENSITIVE Sensitive     CIPROFLOXACIN <=0.25 SENSITIVE Sensitive     GENTAMICIN <=1 SENSITIVE Sensitive     LEVOFLOXACIN <=0.12 SENSITIVE Sensitive     NITROFURANTOIN <=16 SENSITIVE Sensitive     TOBRAMYCIN <=1 SENSITIVE Sensitive     TRIMETH/SULFA >=320 RESISTANT Resistant     PIP/TAZO <=4 SENSITIVE Sensitive     * ESCHERICHIA COLI  Blood Culture (routine x 2)     Status: None   Collection Time: 03/19/14  3:23 PM  Result Value Ref Range Status   Specimen Description BLOOD RIGHT WRIST  Final   Special Requests BOTTLES DRAWN AEROBIC AND ANAEROBIC 5 ML  Final   Culture  Setup Time   Final    03/20/2014 07:48 Performed at [facility]    Culture   Final    STAPHYLOCOCCUS AUREUS Note: RIFAMPIN AND GENTAMICIN SHOULD NOT BE USED AS SINGLE DRUGS FOR TREATMENT OF STAPH INFECTIONS. Note: Gram Stain Report Called to,Read Back By and Verified With: FAYE S BY INGRAM A 03/20/14 11AM Performed at [facility]    Report Status 03/22/2014 FINAL  Final   Organism ID, Bacteria STAPHYLOCOCCUS AUREUS  Final      Susceptibility   Staphylococcus aureus - MIC*    CLINDAMYCIN <=0.25 SENSITIVE Sensitive     ERYTHROMYCIN <=0.25 SENSITIVE Sensitive     GENTAMICIN <=0.5 SENSITIVE Sensitive     LEVOFLOXACIN <=0.12 SENSITIVE Sensitive     OXACILLIN 0.5 SENSITIVE Sensitive     PENICILLIN RESISTANT      RIFAMPIN <=0.5 SENSITIVE Sensitive     TRIMETH/SULFA <=10 SENSITIVE Sensitive     VANCOMYCIN <=0.5 SENSITIVE Sensitive     TETRACYCLINE <=1 SENSITIVE Sensitive     MOXIFLOXACIN <=0.25 SENSITIVE Sensitive     * STAPHYLOCOCCUS AUREUS  Blood Culture (routine x 2)     Status: None   Collection Time: 03/19/14  3:23 PM  Result Value Ref Range Status   Specimen Description BLOOD BLOOD LEFT FOREARM  Final   Special Requests BOTTLES DRAWN AEROBIC AND ANAEROBIC  5 ML  Final   Culture  Setup Time   Final    03/20/2014 07:51 Performed at [facility]    Culture   Final    STAPHYLOCOCCUS AUREUS Note: SUSCEPTIBILITIES PERFORMED ON PREVIOUS CULTURE WITHIN THE LAST 5 DAYS. Note: Gram Stain Report Called to,Read Back By and Verified With: FAYE S BY INGRAM A 03/20/14 11AM Performed at [facility]    Report Status 03/22/2014 FINAL  Final  Culture, blood (routine x 2)     Status: None (Preliminary result)   Collection Time: 03/20/14  5:30 PM  Result Value Ref Range Status   Specimen Description BLOOD RIGHT HAND  Final   Special Requests BOTTLES DRAWN AEROBIC ONLY 8CC  Final   Culture  Setup Time   Final    03/21/2014 01:05 Performed at [facility]    Culture   Final           BLOOD CULTURE RECEIVED NO GROWTH TO DATE CULTURE WILL BE HELD FOR 5 DAYS BEFORE ISSUING A FINAL NEGATIVE REPORT Performed at [facility]    Report Status PENDING  Incomplete  Culture, blood (routine x 2)     Status: None (Preliminary result)   Collection Time: 03/20/14  5:37 PM  Result Value Ref Range Status   Specimen Description BLOOD LEFT ARM  Final   Special Requests   Final  BOTTLES DRAWN AEROBIC ONLY BLUE BOTTLE 9 CC, RED BOTTLE 1 CC   Culture  Setup Time   Final    03/21/2014 01:06 Performed at [facility]    Culture   Final           BLOOD CULTURE RECEIVED NO GROWTH TO DATE CULTURE WILL BE HELD FOR 5 DAYS BEFORE ISSUING A FINAL NEGATIVE REPORT Performed at [facility]    Report Status PENDING  Incomplete  Body fluid culture     Status: None (Preliminary result)   Collection Time: 03/25/14  6:37 PM  Result Value Ref Range Status   Specimen Description SYNOVIAL RIGHT KNEE  Final   Special Requests NONE  Final   Gram Stain   Final    FEW WBC PRESENT,BOTH PMN AND MONONUCLEAR NO ORGANISMS SEEN Performed at [facility]    Culture NO GROWTH Performed at [facility]   Final   Report  Status PENDING  Incomplete  Body fluid culture     Status: None (Preliminary result)   Collection Time: 03/25/14  6:46 PM  Result Value Ref Range Status   Specimen Description SYNOVIAL LEFT KNEE  Final   Special Requests NONE  Final   Gram Stain   Final    ABUNDANT WBC PRESENT,BOTH PMN AND MONONUCLEAR NO ORGANISMS SEEN Performed at [facility]    Culture NO GROWTH Performed at [facility]   Final   Report Status PENDING  Incomplete         Studies: Dg Abd Portable 1v  03/25/2014   CLINICAL DATA:  Feeding tube placement.  EXAM: PORTABLE ABDOMEN - 1 VIEW  COMPARISON:  None.  FINDINGS: Distal tip of feeding tube is seen in the expected position of the proximal stomach. Visualized bowel appears normal.  IMPRESSION: Distal tip of feeding tube is seen in expected position of the proximal stomach.   Electronically Signed   By: Roque Lias M.D.   On: 03/25/2014 09:56   Dg Fluoro Guide Ndl Plc/bx  03/26/2014   CLINICAL DATA:  58 year old female with recent hospitalization with MSSA bacteremia of unknown source and possible rheumatoid arthritis. She is currently admitted with sepsis of unclear source requiring pressors. Possible osteomyelitis of the medial left knee on x-ray. Bilateral knee joint aspirations are requested to evaluate for infectious source  EXAM: FLUOROSCOPICALLY GUIDED BILATERAL KNEE JOINT ASPIRATIONS.  FLUOROSCOPY TIME:  17 seconds  PROCEDURE: After a thorough discussion of risks and benefits of the procedure including the general risks of bleeding, infection, injury to nerves, blood vessels, adjacent structures, and allergic reaction, verbal and written consent was obtained. Verbal consent was obtained by Dr. Archer Asa. Specific risks of this procedure included false positive and false negative results and introduction of infection into the joint. Time-out form was completed when appropriate.  Attention was first turned to the left knee.  The joint was  localized under fluoroscopy. A skin entry site medial to the joint was chosen and marked. The skin was prepped and draped in the usual sterile fashion with Betadine soap. Local anesthesia and deep anesthesia was provided with 1% lidocaine without epinephrine. Careful attention was paid not to inject bacteriostatic lidocaine into the joint. Under fluoroscopic guidance an 18 gauge spinal needle was advanced into the joint. Location within the suprapatellar recess was confirmed by a gentle hand injection of 3 mL Omnipaque 300. Vigorous aspiration was performed which yielded approximately 5 cc of bloody synovial fluid.  The needle was removed  and a sterile dressing applied.  Attention was next turned to the right knee.  The joint was localized under fluoroscopy. A skin entry site lateral to the joint was chosen and marked. The skin was prepped and draped in the usual sterile fashion with Betadine soap. Local anesthesia and deep anesthesia was provided with 1% lidocaine without epinephrine. Careful attention was paid not to inject bacteriostatic lidocaine into the joint. Under fluoroscopic guidance an 18 gauge spinal needle was advanced into the joint. Location within the suprapatellar recess was confirmed by a gentle hand injection of 3 mL Omnipaque 300. Vigorous aspiration was performed which yielded approximately 5 cc of bloody synovial fluid.  Both synovial fluid samples were sent for Gram stain and culture.  IMPRESSION: Technically successful fluoroscopic guided aspiration of the bilateral knee joints.   Electronically Signed   By: Malachy Moan M.D.   On: 03/26/2014 08:39   Dg Fluoro Guide Ndl Plc/bx  03/26/2014   CLINICAL DATA:  58 year old female with recent hospitalization with MSSA bacteremia of unknown source and possible rheumatoid arthritis. She is currently admitted with sepsis of unclear source requiring pressors. Possible osteomyelitis of the medial left knee on x-ray. Bilateral knee joint  aspirations are requested to evaluate for infectious source  EXAM: FLUOROSCOPICALLY GUIDED BILATERAL KNEE JOINT ASPIRATIONS.  FLUOROSCOPY TIME:  17 seconds  PROCEDURE: After a thorough discussion of risks and benefits of the procedure including the general risks of bleeding, infection, injury to nerves, blood vessels, adjacent structures, and allergic reaction, verbal and written consent was obtained. Verbal consent was obtained by Dr. Archer Asa. Specific risks of this procedure included false positive and false negative results and introduction of infection into the joint. Time-out form was completed when appropriate.  Attention was first turned to the left knee.  The joint was localized under fluoroscopy. A skin entry site medial to the joint was chosen and marked. The skin was prepped and draped in the usual sterile fashion with Betadine soap. Local anesthesia and deep anesthesia was provided with 1% lidocaine without epinephrine. Careful attention was paid not to inject bacteriostatic lidocaine into the joint. Under fluoroscopic guidance an 18 gauge spinal needle was advanced into the joint. Location within the suprapatellar recess was confirmed by a gentle hand injection of 3 mL Omnipaque 300. Vigorous aspiration was performed which yielded approximately 5 cc of bloody synovial fluid.  The needle was removed and a sterile dressing applied.  Attention was next turned to the right knee.  The joint was localized under fluoroscopy. A skin entry site lateral to the joint was chosen and marked. The skin was prepped and draped in the usual sterile fashion with Betadine soap. Local anesthesia and deep anesthesia was provided with 1% lidocaine without epinephrine. Careful attention was paid not to inject bacteriostatic lidocaine into the joint. Under fluoroscopic guidance an 18 gauge spinal needle was advanced into the joint. Location within the suprapatellar recess was confirmed by a gentle hand injection of 3 mL  Omnipaque 300. Vigorous aspiration was performed which yielded approximately 5 cc of bloody synovial fluid.  Both synovial fluid samples were sent for Gram stain and culture.  IMPRESSION: Technically successful fluoroscopic guided aspiration of the bilateral knee joints.   Electronically Signed   By: Malachy Moan M.D.   On: 03/26/2014 08:39        Scheduled Meds: . antiseptic oral rinse  7 mL Mouth Rinse q12n4p  . aspirin  81 mg Oral Daily  . atorvastatin  20 mg Oral  q1800  . bisacodyl  10 mg Rectal Once  .  ceFAZolin (ANCEF) IV  2 g Intravenous Q8H  . chlorhexidine  15 mL Mouth Rinse BID  . collagenase   Topical Daily  . enoxaparin (LOVENOX) injection  40 mg Subcutaneous Q24H  . feeding supplement (ENSURE COMPLETE)  237 mL Oral TID BM  . feeding supplement (GLUCERNA 1.2 CAL)  1,000 mL Per Tube Q24H  . fludrocortisone  0.1 mg Oral Daily  . insulin aspart  0-5 Units Subcutaneous QHS  . insulin aspart  0-9 Units Subcutaneous TID WC  . insulin glargine  15 Units Subcutaneous Daily  . mirtazapine  15 mg Oral QHS  . OLANZapine zydis  5 mg Oral QHS  . predniSONE  20 mg Oral Q breakfast  . sodium bicarbonate  650 mg Oral TID  . sodium chloride  10-40 mL Intracatheter Q12H   Continuous Infusions: . 0.9 % NaCl with KCl 20 mEq / L 75 mL/hr at 03/26/14 0839  . phenylephrine (NEO-SYNEPHRINE) Adult infusion Stopped (03/23/14 0800)    Active Problems:   DM2 (diabetes mellitus, type 2)   Physical deconditioning   Hypoalbuminemia   Hyponatremia   Severe sepsis   Acute UTI   Protein-calorie malnutrition, severe   Sacral decubitus ulcer   Acute pulmonary edema   Effusion into joint   ST elevation myocardial infarction (STEMI) of inferolateral wall, initial episode of care   Severe sepsis with septic shock   Diabetic ulcer of left foot associated with diabetes mellitus due to underlying condition   Osteomyelitis   Ulcer of heel   Acute osteomyelitis of femur   Septic arthritis of  knee, left   STEMI (ST elevation myocardial infarction)   Ulcer of left heel   Metabolic acidosis   Bacteremia   Knee pain    Time spent: 40 minutes.    Richarda Overlie, MD [facility] Pager [phone]  If 7PM-7AM, please contact night-coverage [email or web address] Password [email or web address] 03/26/2014, 11:22 AM    LOS: 7 days

## 2014-03-26 NOTE — Progress Notes (Signed)
Clinical Social Work Department CLINICAL SOCIAL WORK PLACEMENT NOTE 03/26/2014  Patient:  Robin Schroeder, Robin Schroeder  Account Number:  [id number] Admit date:  03/19/2014  Clinical Social Worker:  Garlan Fair  Date/time:  03/24/2014 02:24 PM  Clinical Social Work is seeking post-discharge placement for this patient at the following level of care:   SKILLED NURSING   (*CSW will update this form in Epic as items are completed)   03/24/2014  Patient/family provided with [facility] Department of Clinical Social Work's list of facilities offering this level of care within the geographic area requested by the patient (or if unable, by the patient's family).  03/24/2014  Patient/family informed of their freedom to choose among providers that offer the needed level of care, that participate in Medicare, Medicaid or managed care program needed by the patient, have an available bed and are willing to accept the patient.  03/24/2014  Patient/family informed of [facility]' ownership interest in [facility], as well as of the fact that they are under no obligation to receive care at this facility.  PASARR submitted to EDS on 03/24/2014 PASARR number received on 03/24/2014  FL2 transmitted to all facilities in geographic area requested by pt/family on  03/24/2014 FL2 transmitted to all facilities within larger geographic area on   Patient informed that his/her managed care company has contracts with or will negotiate with  certain facilities, including the following:     Patient/family informed of bed offers received:   Patient chooses bed at  Physician recommends and patient chooses bed at    Patient to be transferred to  on   Patient to be transferred to facility by  Patient and family notified of transfer on  Name of family member notified:    The following physician request were entered in Epic:   Additional Comments: Pt is Medicaid Pending and would be LOG.   Loletta Specter, MSW, LCSW Clinical Social Work [phone]

## 2014-03-26 NOTE — Progress Notes (Signed)
Hypoglycemic Event  CBG: 69  Treatment: D50 25 ml  Symptoms: None  Follow-up CBG: Time: 1758 CBG Result: 101  Possible Reasons for Event: Unknown    Treniyah Lynn, Yancey Flemings  Remember to initiate Hypoglycemia Order Set & complete

## 2014-03-27 ENCOUNTER — Inpatient Hospital Stay (HOSPITAL_COMMUNITY): Payer: Medicaid Other

## 2014-03-27 DIAGNOSIS — R112 Nausea with vomiting, unspecified: Secondary | ICD-10-CM

## 2014-03-27 DIAGNOSIS — K567 Ileus, unspecified: Secondary | ICD-10-CM | POA: Insufficient documentation

## 2014-03-27 LAB — BASIC METABOLIC PANEL
Anion gap: 13 (ref 5–15)
BUN: 55 mg/dL — ABNORMAL HIGH (ref 6–23)
CALCIUM: 7.4 mg/dL — AB (ref 8.4–10.5)
CO2: 20 mEq/L (ref 19–32)
CREATININE: 1.27 mg/dL — AB (ref 0.50–1.10)
Chloride: 108 mEq/L (ref 96–112)
GFR calc Af Amer: 52 mL/min — ABNORMAL LOW (ref >90)
GFR calc non Af Amer: 45 mL/min — ABNORMAL LOW (ref >90)
GLUCOSE: 74 mg/dL (ref 70–99)
Potassium: 4.1 mEq/L (ref 3.7–5.3)
Sodium: 141 mEq/L (ref 137–147)

## 2014-03-27 LAB — URINALYSIS, ROUTINE W REFLEX MICROSCOPIC
Bilirubin Urine: NEGATIVE
Glucose, UA: NEGATIVE mg/dL
Ketones, ur: NEGATIVE mg/dL
NITRITE: NEGATIVE
PH: 5 (ref 5.0–8.0)
Protein, ur: 100 mg/dL — AB
SPECIFIC GRAVITY, URINE: 1.021 (ref 1.005–1.030)
Urobilinogen, UA: 0.2 mg/dL (ref 0.0–1.0)

## 2014-03-27 LAB — CULTURE, BLOOD (ROUTINE X 2)
CULTURE: NO GROWTH
Culture: NO GROWTH

## 2014-03-27 LAB — PROTEIN / CREATININE RATIO, URINE
CREATININE, URINE: 43.45 mg/dL
PROTEIN CREATININE RATIO: 3.71 — AB (ref 0.00–0.15)
Total Protein, Urine: 161.2 mg/dL

## 2014-03-27 LAB — GLUCOSE, CAPILLARY
GLUCOSE-CAPILLARY: 82 mg/dL (ref 70–99)
GLUCOSE-CAPILLARY: 91 mg/dL (ref 70–99)
Glucose-Capillary: 73 mg/dL (ref 70–99)
Glucose-Capillary: 89 mg/dL (ref 70–99)
Glucose-Capillary: 81 mg/dL (ref 70–99)

## 2014-03-27 LAB — URINE MICROSCOPIC-ADD ON

## 2014-03-27 LAB — CBC
HCT: 26.3 % — ABNORMAL LOW (ref 36.0–46.0)
HEMATOCRIT: 25 % — AB (ref 36.0–46.0)
HEMOGLOBIN: 8.1 g/dL — AB (ref 12.0–15.0)
Hemoglobin: 8.5 g/dL — ABNORMAL LOW (ref 12.0–15.0)
MCH: 27.7 pg (ref 26.0–34.0)
MCH: 27.6 pg (ref 26.0–34.0)
MCHC: 32.4 g/dL (ref 30.0–36.0)
MCHC: 32.3 g/dL (ref 30.0–36.0)
MCV: 85.6 fL (ref 78.0–100.0)
MCV: 85.4 fL (ref 78.0–100.0)
PLATELETS: 113 10*3/uL — AB (ref 150–400)
Platelets: 92 10*3/uL — ABNORMAL LOW (ref 150–400)
RBC: 2.92 MIL/uL — AB (ref 3.87–5.11)
RBC: 3.08 MIL/uL — ABNORMAL LOW (ref 3.87–5.11)
RDW: 19.3 % — ABNORMAL HIGH (ref 11.5–15.5)
RDW: 19.6 % — ABNORMAL HIGH (ref 11.5–15.5)
WBC: 3.1 10*3/uL — ABNORMAL LOW (ref 4.0–10.5)
WBC: 5.2 10*3/uL (ref 4.0–10.5)

## 2014-03-27 MED ORDER — ALBUMIN HUMAN 25 % IV SOLN
12.5000 g | Freq: Once | INTRAVENOUS | Status: AC
  Administered 2014-03-27: 12.5 g via INTRAVENOUS
  Filled 2014-03-27: qty 50

## 2014-03-27 MED ORDER — ALBUMIN HUMAN 25 % IV SOLN
25.0000 g | Freq: Four times a day (QID) | INTRAVENOUS | Status: DC
  Administered 2014-03-27 – 2014-03-28 (×2): 25 g via INTRAVENOUS
  Filled 2014-03-27 (×2): qty 50
  Filled 2014-03-27: qty 100

## 2014-03-27 MED ORDER — CALCIUM CARBONATE ANTACID 500 MG PO CHEW
1.0000 | CHEWABLE_TABLET | Freq: Once | ORAL | Status: AC
  Administered 2014-03-27: 200 mg via ORAL
  Filled 2014-03-27: qty 1

## 2014-03-27 MED ORDER — INSULIN GLARGINE 100 UNIT/ML ~~LOC~~ SOLN
12.0000 [IU] | Freq: Every day | SUBCUTANEOUS | Status: DC
  Administered 2014-03-28: 12 [IU] via SUBCUTANEOUS
  Filled 2014-03-27 (×2): qty 0.12

## 2014-03-27 MED ORDER — BISACODYL 10 MG RE SUPP
10.0000 mg | Freq: Once | RECTAL | Status: AC
  Administered 2014-03-27: 10 mg via RECTAL
  Filled 2014-03-27: qty 1

## 2014-03-27 MED ORDER — HYDROCORTISONE 1 % EX CREA
TOPICAL_CREAM | Freq: Two times a day (BID) | CUTANEOUS | Status: DC
  Administered 2014-03-27 – 2014-04-05 (×10): via TOPICAL
  Filled 2014-03-27 (×3): qty 28

## 2014-03-27 NOTE — Progress Notes (Signed)
Attempted to see Ceil today but she would not engage with me.  She tells me she needs Tums for heartburn and if I try to divert conversation she will not let me.  I asked if it were okay to call her husband, but she declined.  Ordered one time dose of Tums for her.  Will attempt to re-engage with her tomorrow if she allows. Reviewed psych notes indicating she has capacity.    Orvis Brill D.O. Palliative Medicine Team at [facility]  Pager: [phone] Team Phone: [phone]

## 2014-03-27 NOTE — Progress Notes (Signed)
Clinical Social Work  Psych MD has evaluated patient and made medication recommendations and does not recommend inpatient placement. Per chart review, patient will need assistance with DC planning. Psych CSW is signing off but unit CSW will assist with placement concerns.  [place], [place] [phone]

## 2014-03-27 NOTE — Progress Notes (Addendum)
NUTRITION FOLLOW-UP  INTERVENTION: -Once feeding tube placement confirmed; initiate Glucerna 1.2 at 30 ml/hr and advance Glucerna 1.2 by 10 ml every 10 hours to goal rate of 65 ml/hr; can modify advancement rate pending tolerance and refeeding labs -Recommend refeeding labs (Phos/K/Mg) for next 24 hr, until pt able to consistently tolerate >50% of estimated needs (~40 ml/hr) -RD to continue to monitor  NUTRITION DIAGNOSIS: Inadequate oral intake related to decreased appetite/refusal of meals as evidenced by PO intake < 75% > one month; progressing with tube feeding  Goal: TF +PO intake to meet >/= 90% of their estimated nutrition needs, unmet    Monitor:  Total protein/energy intake, labs, weights, supplement acceptance, skin integrity   Admitting Dx: Septic shock secondary to staph bacteremia  ASSESSMENT: 58 year old female with multiple medical problems who was discharged from the hospital in October after she was treated for MSSA bacteremia with sepsis.  As per patient's husband, patient did not want to eat or drink and she is not eating and drinking for past 5 days and so he got concerned that she wants to hurt herself. So he filled out IVC and patient was brought by the EMS.  Pt reports eating better today, NT agreed. Pt is taking small sips and bites of meals, PO improved. Pt states that she likes the Glucerna shakes and was drinking one during visit.  RD encouraged pt to eat as much as tolerated to promote wound healing.   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 Recommended use of TF vs TPN; however it is likely pt will refuse TF access. Husband verbalized understanding and planned on discussing with pt.  Encouraged nutrition to assist with anasarca, skin integrity and  improve deconditioning -Discussed options with RN. RN in agreement, however also concerned with pt's acceptance of NG/NJ tube -Followed up with pt; however pt's husband not in room -Attempted to contact husband; however was unable to reach. Left voicemail. -RD to follow up tomorrow for decision. Pharmacy in agreement to hold of TPN until decision is made by family -Modified supplement to Ensure as husband believed that she might be more agreeable to different flavors  12/09 -Pt agreeable to initiate TF -NG tube being placed by RN at time of RD follow up -Discussed pt with pharmacy, PharmD noted placement of feeding tube being confirmed by x-ray with plan to start TF as soon as placement confirmed -Modified recommendation to Glucerna 1.2 d/t hx of DM2 -Recommend refeeding labs and conservative advancement, advancement rate can be increased pending lab results  12/10: -RN reported pt tolerating Glucerna 1.2 at 20 ml/hr; however when tube feed was advanced to 30 ml/hr, pt experienced nausea and vomiting. Residuals were 160 ml. RN paged MD, and was instructed to hold TF -Followed up with RN, who reported pt received anti-emetics, and was able to tolerate restarting TF at 20 ml/hr. Pt attributed nausea to oral medication/rinse -Current PO intake 0%, RN noted pt refusing meals and supplements. -Phos/Mg WNL -K Low -Continue with conservative advancement until pt able to tolerate >50% of tube feeding goal rate; replete as needed  12/11: -Per discussion with pt's RN, pt's TF advanced to 40 ml/hr overnight. Experienced large episode of vomiting. TF held.  RN attributes this to possible feeding tube misplacement.  -RN plan to order xray to confirm placement  and to make appropriate adjustments -Recommend to restart at lower rate and conservative advancement to goal rate -Continue with refeeding labs as pt continues to be unable to consistently met >50% estimated nutrition needs and TF goal rate. -Pt  for possible PEG tube placement. MD discussing with pt's husband during time of follow up. Likely next week -WOC to reconsult d/t worsening wounds on sacrum -Pt with 0% PO intake of meals or supplements. Husband may bring in preferred flavor of supplement. Contacted pharmacy but not available with current formulary -Phos/Mg/K WNL, however tube feeding currently held.  Height: Ht Readings from Last 1 Encounters:  03/19/14 5' 4"  (1.626 m)    Weight: Wt Readings from Last 1 Encounters:  03/27/14 205 lb 4 oz (93.1 kg)   BMI:  Body mass index is 35.21 kg/(m^2).  Estimated Nutritional Needs: Kcal: 1800-2000 Protein: 100-110 gram Fluid: per MD  Skin: stg 2 pressure ulcer on heel and arm, deep tissue injury on heel, +4 generalized edema  Diet Order: Diet Carb Modified  EDUCATION NEEDS: -No education needs identified at this time   Intake/Output Summary (Last 24 hours) at 03/27/14 1335 Last data filed at 03/27/14 0700  Gross per 24 hour  Intake   2060 ml  Output      0 ml  Net   2060 ml    Last BM: 12/8  Labs:   Recent Labs Lab 03/22/14 0314 03/23/14 0350 03/24/14 0335 03/26/14 0421 03/27/14 0450  NA 133* 132* 132* 138 141  K 4.0 3.9 3.8 3.5* 4.1  CL 103 101 101 106 108  CO2 18* 16* 18* 20 20  BUN 24* 30* 38* 52* 55*  CREATININE 0.48* 0.69 0.97 1.26* 1.27*  CALCIUM 7.4* 7.6* 7.7* 7.5* 7.4*  MG 1.8 1.9  --  2.2  --   PHOS 1.9* 2.7  --  4.3  --   GLUCOSE 276* 239* 123* 103* 74    CBG (last 3)   Recent Labs  03/26/14 2126 03/27/14 0755 03/27/14 1205  GLUCAP 73 82 89    Scheduled Meds: . albumin human  12.5 g Intravenous Once  . antiseptic oral rinse  7 mL Mouth Rinse q12n4p  . aspirin  81 mg Oral Daily  . atorvastatin  20 mg Oral q1800  . bisacodyl  10 mg Rectal Once  .  ceFAZolin (ANCEF) IV  2 g Intravenous Q8H  . chlorhexidine  15 mL Mouth Rinse BID  . collagenase   Topical Daily  . enoxaparin (LOVENOX) injection  40 mg Subcutaneous Q24H  .  feeding supplement (ENSURE COMPLETE)  237 mL Oral TID BM  . feeding supplement (GLUCERNA 1.2 CAL)  1,000 mL Per Tube Q24H  . fludrocortisone  0.1 mg Oral Daily  . hydrocortisone cream   Topical BID  . insulin aspart  0-5 Units Subcutaneous QHS  . insulin aspart  0-9 Units Subcutaneous TID WC  . [START ON 03/28/2014] insulin glargine  12 Units Subcutaneous Daily  . mirtazapine  15 mg Oral QHS  . OLANZapine zydis  5 mg Oral QHS  . predniSONE  20 mg Oral Q breakfast  . sodium bicarbonate  650 mg Oral TID  . sodium chloride  10-40 mL Intracatheter Q12H    Continuous Infusions: . 0.9 % NaCl with KCl 20 mEq / L 75 mL/hr at 03/26/14 2207  . phenylephrine (NEO-SYNEPHRINE) Adult infusion Stopped (03/23/14 0800)    Atlee Abide MS RD LDN Clinical Dietitian [phone]

## 2014-03-27 NOTE — Progress Notes (Signed)
Explained the importance of being turned to prevent further damage from the pressure ulcer she already have and pt still refused to be moved. Pt also refused to be bathed this morning. Pt is alert and oriented, appears to be withdrawn. She say she just wants to sleep. Will continue to monitor pt

## 2014-03-27 NOTE — Plan of Care (Signed)
Problem: Phase II Progression Outcomes Goal: IV changed to normal saline lock Outcome: Not Applicable Date Met:  32/00/37

## 2014-03-27 NOTE — Progress Notes (Signed)
03/27/14 MMcGibboney, RN, BSN Following pt for [employer].

## 2014-03-27 NOTE — Progress Notes (Addendum)
ANTIBIOTIC CONSULT NOTE - FOLLOW UP  Pharmacy Consult for Ancef Indication: recurrent MSSA bacteremia, Ecoli UTI  Allergies  Allergen Reactions  . Penicillins Rash    Patient Measurements: Height: 5\' 4"  (162.6 cm) Weight: 201 lb 11.5 oz (91.5 kg) IBW/kg (Calculated) : 54.7  Vital Signs: Temp: 97.6 F (36.4 C) (12/11 0621) Temp Source: Oral (12/11 0621) BP: 123/67 mmHg (12/11 0621) Pulse Rate: 84 (12/11 0621) Intake/Output from previous day: 12/10 0701 - 12/11 0700 In: 2550 [P.O.:120; I.V.:1600; NG/GT:515; IV Piggyback:150] Out: 185 [Urine:185] Intake/Output from this shift:    Labs:  Recent Labs  03/25/14 0440 03/26/14 0421 03/27/14 0450  WBC 3.7* 2.9* 3.1*  HGB 9.1* 7.8* 8.1*  PLT 96* 79* 92*  CREATININE  --  1.26* 1.27*   Estimated Creatinine Clearance: 51.6 mL/min (by C-G formula based on Cr of 1.27). No results for input(s): VANCOTROUGH, VANCOPEAK, VANCORANDOM, GENTTROUGH, GENTPEAK, GENTRANDOM, TOBRATROUGH, TOBRAPEAK, TOBRARND, AMIKACINPEAK, AMIKACINTROU, AMIKACIN in the last 72 hours.   Microbiology: Recent Results (from the past 720 hour(s))  Urine culture     Status: None   Collection Time: 03/19/14  3:04 PM  Result Value Ref Range Status   Specimen Description URINE, CATHETERIZED  Final   Special Requests NONE  Final   Culture  Setup Time   Final    03/20/2014 00:29 Performed at [facility] Count   Final    >=100,000 COLONIES/ML Performed at [facility]    Culture   Final    ESCHERICHIA COLI Note: Two isolates with different morphologies were identified as the same organism.The most resistant organism was reported. Performed at [facility]    Report Status 03/23/2014 FINAL  Final   Organism ID, Bacteria ESCHERICHIA COLI  Final      Susceptibility   Escherichia coli - MIC*    AMPICILLIN >=32 RESISTANT Resistant     CEFAZOLIN <=4 SENSITIVE Sensitive     CEFTRIAXONE <=1 SENSITIVE Sensitive      CIPROFLOXACIN <=0.25 SENSITIVE Sensitive     GENTAMICIN <=1 SENSITIVE Sensitive     LEVOFLOXACIN <=0.12 SENSITIVE Sensitive     NITROFURANTOIN <=16 SENSITIVE Sensitive     TOBRAMYCIN <=1 SENSITIVE Sensitive     TRIMETH/SULFA >=320 RESISTANT Resistant     PIP/TAZO <=4 SENSITIVE Sensitive     * ESCHERICHIA COLI  Blood Culture (routine x 2)     Status: None   Collection Time: 03/19/14  3:23 PM  Result Value Ref Range Status   Specimen Description BLOOD RIGHT WRIST  Final   Special Requests BOTTLES DRAWN AEROBIC AND ANAEROBIC 5 ML  Final   Culture  Setup Time   Final    03/20/2014 07:48 Performed at 14/07[facility]2013    Culture   Final    STAPHYLOCOCCUS AUREUS Note: RIFAMPIN AND GENTAMICIN SHOULD NOT BE USED AS SINGLE DRUGS FOR TREATMENT OF STAPH INFECTIONS. Note: Gram Stain Report Called to,Read Back By and Verified With: FAYE S BY INGRAM A 03/20/14 11AM Performed at [facility]15    Report Status 03/22/2014 FINAL  Final   Organism ID, Bacteria STAPHYLOCOCCUS AUREUS  Final      Susceptibility   Staphylococcus aureus - MIC*    CLINDAMYCIN <=0.25 SENSITIVE Sensitive     ERYTHROMYCIN <=0.25 SENSITIVE Sensitive     GENTAMICIN <=0.5 SENSITIVE Sensitive     LEVOFLOXACIN <=0.12 SENSITIVE Sensitive     OXACILLIN 0.5 SENSITIVE Sensitive     PENICILLIN RESISTANT  RIFAMPIN <=0.5 SENSITIVE Sensitive     TRIMETH/SULFA <=10 SENSITIVE Sensitive     VANCOMYCIN <=0.5 SENSITIVE Sensitive     TETRACYCLINE <=1 SENSITIVE Sensitive     MOXIFLOXACIN <=0.25 SENSITIVE Sensitive     * STAPHYLOCOCCUS AUREUS  Blood Culture (routine x 2)     Status: None   Collection Time: 03/19/14  3:23 PM  Result Value Ref Range Status   Specimen Description BLOOD BLOOD LEFT FOREARM  Final   Special Requests BOTTLES DRAWN AEROBIC AND ANAEROBIC 5 ML  Final   Culture  Setup Time   Final    03/20/2014 07:51 Performed at [facility]    Culture   Final    STAPHYLOCOCCUS AUREUS Note:  SUSCEPTIBILITIES PERFORMED ON PREVIOUS CULTURE WITHIN THE LAST 5 DAYS. Note: Gram Stain Report Called to,Read Back By and Verified With: FAYE S BY INGRAM A 03/20/14 11AM Performed at [facility]    Report Status 03/22/2014 FINAL  Final  Culture, blood (routine x 2)     Status: None (Preliminary result)   Collection Time: 03/20/14  5:30 PM  Result Value Ref Range Status   Specimen Description BLOOD RIGHT HAND  Final   Special Requests BOTTLES DRAWN AEROBIC ONLY 8CC  Final   Culture  Setup Time   Final    03/21/2014 01:05 Performed at [facility]    Culture   Final           BLOOD CULTURE RECEIVED NO GROWTH TO DATE CULTURE WILL BE HELD FOR 5 DAYS BEFORE ISSUING A FINAL NEGATIVE REPORT Performed at [facility]    Report Status PENDING  Incomplete  Culture, blood (routine x 2)     Status: None (Preliminary result)   Collection Time: 03/20/14  5:37 PM  Result Value Ref Range Status   Specimen Description BLOOD LEFT ARM  Final   Special Requests   Final    BOTTLES DRAWN AEROBIC ONLY BLUE BOTTLE 9 CC, RED BOTTLE 1 CC   Culture  Setup Time   Final    03/21/2014 01:06 Performed at [facility]    Culture   Final           BLOOD CULTURE RECEIVED NO GROWTH TO DATE CULTURE WILL BE HELD FOR 5 DAYS BEFORE ISSUING A FINAL NEGATIVE REPORT Performed at [facility]    Report Status PENDING  Incomplete  Body fluid culture     Status: None (Preliminary result)   Collection Time: 03/25/14  6:37 PM  Result Value Ref Range Status   Specimen Description SYNOVIAL RIGHT KNEE  Final   Special Requests NONE  Final   Gram Stain   Final    FEW WBC PRESENT,BOTH PMN AND MONONUCLEAR NO ORGANISMS SEEN Performed at [facility]    Culture NO GROWTH Performed at [facility]   Final   Report Status PENDING  Incomplete  Body fluid culture     Status: None (Preliminary result)   Collection Time: 03/25/14  6:46 PM  Result Value Ref Range Status    Specimen Description SYNOVIAL LEFT KNEE  Final   Special Requests NONE  Final   Gram Stain   Final    ABUNDANT WBC PRESENT,BOTH PMN AND MONONUCLEAR NO ORGANISMS SEEN Performed at [facility]    Culture NO GROWTH Performed at [facility]   Final   Report Status PENDING  Incomplete    Anti-infectives    Start     Dose/Rate  Route Frequency Ordered Stop   03/21/14 1200  ceFAZolin (ANCEF) IVPB 2 g/50 mL premix     2 g100 mL/hr over 30 Minutes Intravenous Every 8 hours 03/21/14 1051     03/21/14 1041  vancomycin (VANCOCIN) 1 GM/200ML IVPB    Comments:  [name]   : cabinet override      03/21/14 1041 03/21/14 2244   03/20/14 1600  levofloxacin (LEVAQUIN) IVPB 750 mg  Status:  Discontinued     750 mg100 mL/hr over 90 Minutes Intravenous Every 24 hours 03/19/14 1629 03/19/14 1709   03/20/14 0000  vancomycin (VANCOCIN) 1,250 mg in sodium chloride 0.9 % 250 mL IVPB  Status:  Discontinued     1,250 mg166.7 mL/hr over 90 Minutes Intravenous Every 12 hours 03/19/14 1627 03/22/14 1238   03/20/14 0000  aztreonam (AZACTAM) 2 g in dextrose 5 % 50 mL IVPB  Status:  Discontinued     2 g100 mL/hr over 30 Minutes Intravenous Every 8 hours 03/19/14 1628 03/20/14 1147   03/19/14 1445  levofloxacin (LEVAQUIN) IVPB 750 mg     750 mg100 mL/hr over 90 Minutes Intravenous  Once 03/19/14 1435 03/19/14 1708   03/19/14 1445  aztreonam (AZACTAM) 2 g in dextrose 5 % 50 mL IVPB     2 g100 mL/hr over 30 Minutes Intravenous  Once 03/19/14 1435 03/19/14 1708   03/19/14 1445  vancomycin (VANCOCIN) IVPB 1000 mg/200 mL premix     1,000 mg200 mL/hr over 60 Minutes Intravenous  Once 03/19/14 1435 03/19/14 1707      Assessment: 58 yoF with RA and recent MSSA bacteremia 10/13, blood cx cleared on 10/16. Some notes claim pt placed on cefazolin 2gm Q8hr, however can only find that patient was on vancomycin per Epic records. Noted TEE on 10/19 , negative for vegetations. She was to finish 14 day course  on 10/30 using 10/16 as day 1. She was discharged to SNF for IV abx and rehab. On 12/3 pt brought to ED for IVC as pt refusing care.  Patient was started on empiric vancomycin; changed to cefazolin with Cx showing MSSA.  Also with E. Coli UTI sens to cefazolin.  Noted pt allergy to PCN, but has tolerated ceftriaxone and cefazolin well.  Per ID, to complete 6 wks cefazolin 2g IV q8 as source of recurrent bacteremia has yet to be identified.  Will need TEE when stable, MRI of foot/ankle and pelvis to rule out septic arthritis or osteo.  First clear BCx on 12/4 = day 1/42.  PICC placed for outpatient antibiotics and possible TPN  12/3 >> Vancomycin >> 12/6 12/3 >> Aztreonam >>12/4 12/3 >> Levaquin x 1 dose 12/5 >> cefazolin >>  Tmax: 101.6 on 12/4, afebrile since WBC: sl low  Renal: SCr 1.27, Cl ~ 54N, but noted this is 3-4x baseline; appears to have plateaued 12/11 Lactate: 2.53 > 1.6  12/3 blood x2: 2/2 MSSA (R=PCN only) 12/3 urine: >100k GNR - Ecoli - resistant to amp and Bactrim only 12/4 acute hepatitis panel: all negative 12/4 HIV Ab: none reactive 12/4 blood x2: NGTD 12/9 Synovial fluid (bilateral knee): ngtd L or R 12/7 CXR: increasing infiltrate     Goal of Therapy:  treatment of infection, adjustment per renal function   Plan:   Continue cefazolin 2g IV q8h.  Given relatively high SCr compared to pt baseline, will continue to dose per CrCl but follow closely for signs of neuro- or nephrotoxicity.  SCr appears to have plateaued today.  If the rise in SCr was due to vancomycin, this may begin to resolve in the next few days.    Daily BMP until AKI resolved  Bernadene Person, PharmD Pager: [phone] 03/27/2014, 8:25 AM

## 2014-03-27 NOTE — Progress Notes (Signed)
Pt noted to have moderate of bloody discharge from around catheter site. MD is in room and aware of the bleeding. Pt cleaned and will monitor closely.

## 2014-03-27 NOTE — Progress Notes (Signed)
PT Cancellation Note  Patient Details Name: Robin Schroeder MRN: [id number] DOB: [birth year]   Cancelled Treatment:     Multiple medical issues inhibiting pt tolerance today.  Will check back as schedule permits.  Felecia Shelling  PTA [facility]  Acute  Rehab Pager      [phone]    Armando Reichert 03/27/2014, 2:20 PM

## 2014-03-27 NOTE — Progress Notes (Addendum)
[facility]    Date of Admission:  03/19/2014   Total days of antibiotics 9        Day 7 cefazolin           ID: Robin Schroeder is a 57 y.o. female with recurrent MSSA bacteremia c/b inferior STEMI, AKI Active Problems:   DM2 (diabetes mellitus, type 2)   Physical deconditioning   Hypoalbuminemia   Hyponatremia   Severe sepsis   Acute UTI   Protein-calorie malnutrition, severe   Sacral decubitus ulcer   Acute pulmonary edema   Effusion into joint   ST elevation myocardial infarction (STEMI) of inferolateral wall, initial episode of care   Severe sepsis with septic shock   Diabetic ulcer of left foot associated with diabetes mellitus due to underlying condition   Osteomyelitis   Ulcer of heel   Acute osteomyelitis of femur   Septic arthritis of knee, left   STEMI (ST elevation myocardial infarction)   Ulcer of left heel   Metabolic acidosis   Bacteremia   Knee pain    Subjective: Afebrile,  She is having nausea and vomiting this morning. Feeling poorly. Not speaking much to her sister     Medications:  . antiseptic oral rinse  7 mL Mouth Rinse q12n4p  . atorvastatin  20 mg Oral q1800  . bisacodyl  10 mg Rectal Once  .  ceFAZolin (ANCEF) IV  2 g Intravenous Q8H  . chlorhexidine  15 mL Mouth Rinse BID  . feeding supplement (ENSURE COMPLETE)  237 mL Oral TID BM  . feeding supplement (GLUCERNA 1.2 CAL)  1,000 mL Per Tube Q24H  . fludrocortisone  0.1 mg Oral Daily  . hydrocortisone cream   Topical BID  . insulin aspart  0-5 Units Subcutaneous QHS  . insulin aspart  0-9 Units Subcutaneous TID WC  . [START ON 03/28/2014] insulin glargine  12 Units Subcutaneous Daily  . mirtazapine  15 mg Oral QHS  . OLANZapine zydis  5 mg Oral QHS  . predniSONE  20 mg Oral Q breakfast  . sodium bicarbonate  650 mg Oral TID  . sodium chloride  10-40 mL Intracatheter Q12H    Objective: Vital signs in last 24 hours: Temp:  [97.2 F (36.2 C)-97.9 F  (36.6 C)] 97.2 F (36.2 C) (12/11 1500) Pulse Rate:  [77-87] 77 (12/11 1500) Resp:  [16] 16 (12/11 1500) BP: (120-125)/(58-67) 120/64 mmHg (12/11 1500) SpO2:  [98 %-99 %] 99 % (12/11 1500) Weight:  [205 lb 4 oz (93.1 kg)] 205 lb 4 oz (93.1 kg) (12/11 1034) Physical Exam  Constitutional: . appears anasarcic, disheveled. Awakens to voice answering more questions today, looks fatigue HENT: ng in place Mouth/Throat: Oropharynx is dry.  Cardiovascular: Normal rate, regular rhythm and normal heart sounds. Exam reveals no gallop and no friction rub.  No murmur heard.  Pulmonary/Chest: Effort normal and breath sounds normal. No respiratory distress.  has no wheezes.  Abdominal: mildlly distension. Decrease bowel sounds.There is no tenderness.  Neurological:  She raises right arm and right elbow and but unable to do so with left arm. Feels too weak to move legs Skin: heel ulcer to medial aspect of left leg. echymosis to right foot sole, decub to right calf Ext: diffusely anasarca to L> R arm, but also anasarca to all extremities. Psychiatric: flat affect, responding to questions less today  Lab Results  Recent Labs  03/26/14 0421 03/27/14 0450  WBC 2.9* 3.1*  HGB  7.8* 8.1*  HCT 23.9* 25.0*  NA 138 141  K 3.5* 4.1  CL 106 108  CO2 20 20  BUN 52* 55*  CREATININE 1.26* 1.27*   Lab Results  Component Value Date   ESRSEDRATE 90* 03/21/2014   Lab Results  Component Value Date   CRP 16.1* 03/21/2014   Arthrocentesis =  40,000 cells 88%N and 11,050 95% N and no cells. Cloudy with rBC Microbiology: 12/3 blood cx x 2 MSSA 12/4 blood cx NGTD  Studies/Results: Dg Abd 1 View  03/27/2014   CLINICAL DATA:  Severe nausea and vomiting.  EXAM: ABDOMEN - 1 VIEW  COMPARISON:  03/25/2014  FINDINGS: Gaseous distension of the large bowel loops noted. A large desiccated stool ball is noted within the rectum. The patient's feeding tube is no longer visualized.  IMPRESSION: 1. Feeding tube is no  longer visualized and may have been pulled back into the esophagus. 2. No change in gaseous distension of large bowel loops with desiccated stool in the rectum.   Electronically Signed   By: Signa Kell M.D.   On: 03/27/2014 15:58   Dg Fluoro Guide Ndl Plc/bx  03/26/2014   CLINICAL DATA:  58 year old female with recent hospitalization with MSSA bacteremia of unknown source and possible rheumatoid arthritis. She is currently admitted with sepsis of unclear source requiring pressors. Possible osteomyelitis of the medial left knee on x-ray. Bilateral knee joint aspirations are requested to evaluate for infectious source  EXAM: FLUOROSCOPICALLY GUIDED BILATERAL KNEE JOINT ASPIRATIONS.  FLUOROSCOPY TIME:  17 seconds  PROCEDURE: After a thorough discussion of risks and benefits of the procedure including the general risks of bleeding, infection, injury to nerves, blood vessels, adjacent structures, and allergic reaction, verbal and written consent was obtained. Verbal consent was obtained by Dr. Archer Asa. Specific risks of this procedure included false positive and false negative results and introduction of infection into the joint. Time-out form was completed when appropriate.  Attention was first turned to the left knee.  The joint was localized under fluoroscopy. A skin entry site medial to the joint was chosen and marked. The skin was prepped and draped in the usual sterile fashion with Betadine soap. Local anesthesia and deep anesthesia was provided with 1% lidocaine without epinephrine. Careful attention was paid not to inject bacteriostatic lidocaine into the joint. Under fluoroscopic guidance an 18 gauge spinal needle was advanced into the joint. Location within the suprapatellar recess was confirmed by a gentle hand injection of 3 mL Omnipaque 300. Vigorous aspiration was performed which yielded approximately 5 cc of bloody synovial fluid.  The needle was removed and a sterile dressing applied.   Attention was next turned to the right knee.  The joint was localized under fluoroscopy. A skin entry site lateral to the joint was chosen and marked. The skin was prepped and draped in the usual sterile fashion with Betadine soap. Local anesthesia and deep anesthesia was provided with 1% lidocaine without epinephrine. Careful attention was paid not to inject bacteriostatic lidocaine into the joint. Under fluoroscopic guidance an 18 gauge spinal needle was advanced into the joint. Location within the suprapatellar recess was confirmed by a gentle hand injection of 3 mL Omnipaque 300. Vigorous aspiration was performed which yielded approximately 5 cc of bloody synovial fluid.  Both synovial fluid samples were sent for Gram stain and culture.  IMPRESSION: Technically successful fluoroscopic guided aspiration of the bilateral knee joints.   Electronically Signed   By: Malachy Moan M.D.   On:  03/26/2014 08:39   Dg Fluoro Guide Ndl Plc/bx  03/26/2014   CLINICAL DATA:  58 year old female with recent hospitalization with MSSA bacteremia of unknown source and possible rheumatoid arthritis. She is currently admitted with sepsis of unclear source requiring pressors. Possible osteomyelitis of the medial left knee on x-ray. Bilateral knee joint aspirations are requested to evaluate for infectious source  EXAM: FLUOROSCOPICALLY GUIDED BILATERAL KNEE JOINT ASPIRATIONS.  FLUOROSCOPY TIME:  17 seconds  PROCEDURE: After a thorough discussion of risks and benefits of the procedure including the general risks of bleeding, infection, injury to nerves, blood vessels, adjacent structures, and allergic reaction, verbal and written consent was obtained. Verbal consent was obtained by Dr. Archer Asa. Specific risks of this procedure included false positive and false negative results and introduction of infection into the joint. Time-out form was completed when appropriate.  Attention was first turned to the left knee.  The joint  was localized under fluoroscopy. A skin entry site medial to the joint was chosen and marked. The skin was prepped and draped in the usual sterile fashion with Betadine soap. Local anesthesia and deep anesthesia was provided with 1% lidocaine without epinephrine. Careful attention was paid not to inject bacteriostatic lidocaine into the joint. Under fluoroscopic guidance an 18 gauge spinal needle was advanced into the joint. Location within the suprapatellar recess was confirmed by a gentle hand injection of 3 mL Omnipaque 300. Vigorous aspiration was performed which yielded approximately 5 cc of bloody synovial fluid.  The needle was removed and a sterile dressing applied.  Attention was next turned to the right knee.  The joint was localized under fluoroscopy. A skin entry site lateral to the joint was chosen and marked. The skin was prepped and draped in the usual sterile fashion with Betadine soap. Local anesthesia and deep anesthesia was provided with 1% lidocaine without epinephrine. Careful attention was paid not to inject bacteriostatic lidocaine into the joint. Under fluoroscopic guidance an 18 gauge spinal needle was advanced into the joint. Location within the suprapatellar recess was confirmed by a gentle hand injection of 3 mL Omnipaque 300. Vigorous aspiration was performed which yielded approximately 5 cc of bloody synovial fluid.  Both synovial fluid samples were sent for Gram stain and culture.  IMPRESSION: Technically successful fluoroscopic guided aspiration of the bilateral knee joints.   Electronically Signed   By: Malachy Moan M.D.   On: 03/26/2014 08:39    Assessment/Plan: Recurrent MSSA bacteremia but also has numerous pressure ulcers, unable to mobilize due to deconditioning and polyarthritis from untreated rheumatoid arthritis,(newly diagnosed in Oct 2015)  MSSA bacteremia = still need to determine source of infection. She had bilateral knee arthrocentesis that appear more  consistent with inflammation rather than septic arthritis, although these were drawn after being on antibiotics for 4-5 days.  Due to ACS, she is unable to undergo any extensive sedation for TEE or MRI.  treat for extended course with 6 wk with cefazolin 2gm IV q8hr for now using 12/4 as day 1 of 32.  Still recommend to get MRI of left foot to see if signs of osteomyelitis associate with heal decub. There is prn dilaudid to treat pain and would not need sedation for foot mri.  aki with oligouria = appears to have peaked.  She is intravascularly dry from anasarca but fluid and albumin have not improved her UOP. Defer to nephro for further management. For now keep on renally dosed cefazolin, she continues to have AKI, cr up to 1.2 with  her baseline at 0.3.  If some of aki due to vanco toxicity, it should start to improve.   N/V = recommend to get abd xray since she is also mildly distended with decrease bowel sounds. Consider ileus vs. dobhoff malpositioend  Anasarca  2/2 protein caloric malnutrition= continue with TF today. Her malnutrition probably occurred over a period of a few months. I am concern that her lack of appetite is likely due to depression or other mental illness. May need peg  Pressure ulcer = continue with local wound care  Newly diagnosed Rheumatoid Arthritis = continue on prednisone 20 mg daily to see if she has temporary relief of joint pain, possibly increased appetite. I suspect her immobility is in part due other polyarthritis from RA.   - recommend to get case management started on medicaid app so that she may have resources and access to care when she gets discharged  ACS/inferior STEMI= in setting of sepsis from MSSA. Cards recommending low dose asa and low dose metop for management.   Depression =  TSH and FT4 are more or less within normal limits. Appreciated Dr. Shela Commons input on management   Dr. Orvan Falconer to see over the weekend.  Drue Second [facility]  [facility] Cell: [phone] Pager: [phone]  03/27/2014, 5:16 PM

## 2014-03-27 NOTE — Consult Note (Signed)
58 year old female with multiple medical problems who was treated for MSSA bacteremia with sepsis.  MSSA bacteremia on 10/13, blood cultures cleared on 10/16. TEE on 1019 which was negative for vegetations. Patient was discharged to skilled nursing facility for IV cefazolin and rehabilitation.  She was readmitted on 12/3 with altered mental status, temp to 103.2 and hypotension and treated for shock.  Since last hospitalization she has had failure to thrive, decubitus ulcer on sacrum, is nonambulatory and has had limited PO intake. On 12/3 creat was 0.37 and albumin 1.2. Renal is asked to see at this time due to a creat rise to 1.27 todayand decreased UOP. Urine is growing E.COli from 12/04 and blood pos for MSSA fro 12/03.  She has had septic shock, acute IMI, and ? Knee osteo on L. Mental status has improved. UA today had 171m/dl protein, 21-50 WBCs and 11-20 RBCs.  Renal ultrasound 01/27/14 revealed nl kidneys and evidence of splenomegaly.     Past Medical History  Diagnosis Date  . Hypertension   . Diabetes mellitus without complication   . Arthritis   . Coronary artery disease     pt unaware   Past Surgical History  Procedure Laterality Date  . Tee without cardioversion N/A 02/02/2014    Procedure: TRANSESOPHAGEAL ECHOCARDIOGRAM (TEE);  Surgeon: MCandee Furbish MD;  [facility];  Service: Cardiovascular;  Laterality: N/A;   Social History:  reports that she has never smoked. She does not have any smokeless tobacco history on file. She reports that she does not drink alcohol or use illicit drugs. Allergies:  Allergies  Allergen Reactions  . Penicillins Rash   Family History  Problem Relation Age of Onset  . Hypertension Brother     Medications:  Scheduled: . antiseptic oral rinse  7 mL Mouth Rinse q12n4p  . atorvastatin  20 mg Oral q1800  . bisacodyl  10 mg Rectal Once  .  ceFAZolin (ANCEF) IV  2 g Intravenous Q8H  . chlorhexidine  15 mL Mouth Rinse BID  . feeding  supplement (ENSURE COMPLETE)  237 mL Oral TID BM  . feeding supplement (GLUCERNA 1.2 CAL)  1,000 mL Per Tube Q24H  . fludrocortisone  0.1 mg Oral Daily  . hydrocortisone cream   Topical BID  . insulin aspart  0-5 Units Subcutaneous QHS  . insulin aspart  0-9 Units Subcutaneous TID WC  . [START ON 03/28/2014] insulin glargine  12 Units Subcutaneous Daily  . mirtazapine  15 mg Oral QHS  . OLANZapine zydis  5 mg Oral QHS  . predniSONE  20 mg Oral Q breakfast  . sodium bicarbonate  650 mg Oral TID  . sodium chloride  10-40 mL Intracatheter Q12H   ROS: not obtainable  Blood pressure 115/47, pulse 86, temperature 98 F (36.7 C), temperature source Oral, resp. rate 18, height 5' 4"  (1.626 m), weight 90.5 kg (199 lb 8.3 oz), SpO2 96 %.  General appearance: awake and nonengaging in conversation Head: Normocephalic, without obvious abnormality, atraumatic Eyes: negative Resp: clear to auscultation bilaterally Chest wall: no tenderness Cardio: regular rate and rhythm, S1, S2 normal, no murmur, click, rub or gallop GI: soft, non-tender; bowel sounds normal; no masses,  no organomegaly Extremities: edema diffuse with skin breakdown Skin: Skin color, texture, turgor normal. No rashes or lesions  Neuro limited verbal communication Results for orders placed or performed during the hospital encounter of 03/19/14 (from the past 48 hour(s))  Glucose, capillary     Status: Abnormal  Collection Time: 03/25/14  9:17 PM  Result Value Ref Range   Glucose-Capillary 122 (H) 70 - 99 mg/dL   Comment 1 Notify RN   CBC     Status: Abnormal   Collection Time: 03/26/14  4:21 AM  Result Value Ref Range   WBC 2.9 (L) 4.0 - 10.5 K/uL   RBC 2.79 (L) 3.87 - 5.11 MIL/uL   Hemoglobin 7.8 (L) 12.0 - 15.0 g/dL   HCT 23.9 (L) 36.0 - 46.0 %   MCV 85.7 78.0 - 100.0 fL   MCH 28.0 26.0 - 34.0 pg   MCHC 32.6 30.0 - 36.0 g/dL   RDW 18.0 (H) 11.5 - 15.5 %   Platelets 79 (L) 150 - 400 K/uL    Comment: CONSISTENT WITH  PREVIOUS RESULT  Comprehensive metabolic panel     Status: Abnormal   Collection Time: 03/26/14  4:21 AM  Result Value Ref Range   Sodium 138 137 - 147 mEq/L   Potassium 3.5 (L) 3.7 - 5.3 mEq/L   Chloride 106 96 - 112 mEq/L   CO2 20 19 - 32 mEq/L   Glucose, Bld 103 (H) 70 - 99 mg/dL   BUN 52 (H) 6 - 23 mg/dL   Creatinine, Ser 1.26 (H) 0.50 - 1.10 mg/dL   Calcium 7.5 (L) 8.4 - 10.5 mg/dL   Total Protein 5.0 (L) 6.0 - 8.3 g/dL   Albumin 1.3 (L) 3.5 - 5.2 g/dL   AST 52 (H) 0 - 37 U/L   ALT <5 0 - 35 U/L   Alkaline Phosphatase 426 (H) 39 - 117 U/L   Total Bilirubin 0.8 0.3 - 1.2 mg/dL   GFR calc non Af Amer 45 (L) >90 mL/min   GFR calc Af Amer 53 (L) >90 mL/min    Comment: (NOTE) The eGFR has been calculated using the CKD EPI equation. This calculation has not been validated in all clinical situations. eGFR's persistently <90 mL/min signify possible Chronic Kidney Disease.    Anion gap 12 5 - 15  Phosphorus     Status: None   Collection Time: 03/26/14  4:21 AM  Result Value Ref Range   Phosphorus 4.3 2.3 - 4.6 mg/dL  Magnesium     Status: None   Collection Time: 03/26/14  4:21 AM  Result Value Ref Range   Magnesium 2.2 1.5 - 2.5 mg/dL  Glucose, capillary     Status: None   Collection Time: 03/26/14  7:50 AM  Result Value Ref Range   Glucose-Capillary 95 70 - 99 mg/dL  Glucose, capillary     Status: None   Collection Time: 03/26/14 11:58 AM  Result Value Ref Range   Glucose-Capillary 88 70 - 99 mg/dL  Glucose, capillary     Status: Abnormal   Collection Time: 03/26/14  5:22 PM  Result Value Ref Range   Glucose-Capillary 69 (L) 70 - 99 mg/dL  Glucose, capillary     Status: Abnormal   Collection Time: 03/26/14  5:58 PM  Result Value Ref Range   Glucose-Capillary 101 (H) 70 - 99 mg/dL  Glucose, capillary     Status: None   Collection Time: 03/26/14  9:26 PM  Result Value Ref Range   Glucose-Capillary 73 70 - 99 mg/dL  CBC     Status: Abnormal   Collection Time:  03/27/14  4:50 AM  Result Value Ref Range   WBC 3.1 (L) 4.0 - 10.5 K/uL   RBC 2.92 (L) 3.87 - 5.11 MIL/uL   Hemoglobin  8.1 (L) 12.0 - 15.0 g/dL   HCT 25.0 (L) 36.0 - 46.0 %   MCV 85.6 78.0 - 100.0 fL   MCH 27.7 26.0 - 34.0 pg   MCHC 32.4 30.0 - 36.0 g/dL   RDW 19.3 (H) 11.5 - 15.5 %   Platelets 92 (L) 150 - 400 K/uL    Comment: CONSISTENT WITH PREVIOUS RESULT  Basic metabolic panel     Status: Abnormal   Collection Time: 03/27/14  4:50 AM  Result Value Ref Range   Sodium 141 137 - 147 mEq/L   Potassium 4.1 3.7 - 5.3 mEq/L   Chloride 108 96 - 112 mEq/L   CO2 20 19 - 32 mEq/L   Glucose, Bld 74 70 - 99 mg/dL   BUN 55 (H) 6 - 23 mg/dL   Creatinine, Ser 1.27 (H) 0.50 - 1.10 mg/dL   Calcium 7.4 (L) 8.4 - 10.5 mg/dL   GFR calc non Af Amer 45 (L) >90 mL/min   GFR calc Af Amer 52 (L) >90 mL/min    Comment: (NOTE) The eGFR has been calculated using the CKD EPI equation. This calculation has not been validated in all clinical situations. eGFR's persistently <90 mL/min signify possible Chronic Kidney Disease.    Anion gap 13 5 - 15  Glucose, capillary     Status: None   Collection Time: 03/27/14  7:55 AM  Result Value Ref Range   Glucose-Capillary 82 70 - 99 mg/dL  Glucose, capillary     Status: None   Collection Time: 03/27/14 12:05 PM  Result Value Ref Range   Glucose-Capillary 89 70 - 99 mg/dL  Glucose, capillary     Status: None   Collection Time: 03/27/14  4:22 PM  Result Value Ref Range   Glucose-Capillary 81 70 - 99 mg/dL  CBC     Status: Abnormal   Collection Time: 03/27/14  5:00 PM  Result Value Ref Range   WBC 5.2 4.0 - 10.5 K/uL   RBC 3.08 (L) 3.87 - 5.11 MIL/uL   Hemoglobin 8.5 (L) 12.0 - 15.0 g/dL   HCT 26.3 (L) 36.0 - 46.0 %   MCV 85.4 78.0 - 100.0 fL   MCH 27.6 26.0 - 34.0 pg   MCHC 32.3 30.0 - 36.0 g/dL   RDW 19.6 (H) 11.5 - 15.5 %   Platelets 113 (L) 150 - 400 K/uL    Comment: REPEATED TO VERIFY SPECIMEN CHECKED FOR CLOTS CONSISTENT WITH PREVIOUS  RESULT   Urinalysis, Routine w reflex microscopic     Status: Abnormal   Collection Time: 03/27/14  5:21 PM  Result Value Ref Range   Color, Urine AMBER (A) YELLOW    Comment: BIOCHEMICALS MAY BE AFFECTED BY COLOR   APPearance TURBID (A) CLEAR   Specific Gravity, Urine 1.021 1.005 - 1.030   pH 5.0 5.0 - 8.0   Glucose, UA NEGATIVE NEGATIVE mg/dL   Hgb urine dipstick LARGE (A) NEGATIVE   Bilirubin Urine NEGATIVE NEGATIVE   Ketones, ur NEGATIVE NEGATIVE mg/dL   Protein, ur 100 (A) NEGATIVE mg/dL   Urobilinogen, UA 0.2 0.0 - 1.0 mg/dL   Nitrite NEGATIVE NEGATIVE   Leukocytes, UA LARGE (A) NEGATIVE  Urine microscopic-add on     Status: Abnormal   Collection Time: 03/27/14  5:21 PM  Result Value Ref Range   Squamous Epithelial / LPF RARE RARE   WBC, UA 21-50 <3 WBC/hpf   RBC / HPF 11-20 <3 RBC/hpf   Bacteria, UA FEW (A) RARE  Casts HYALINE CASTS (A) NEGATIVE    Comment: GRANULAR CAST   Urine-Other MANY YEAST     Comment: BUDDING   Dg Abd 1 View  03/27/2014   CLINICAL DATA:  Severe nausea and vomiting.  EXAM: ABDOMEN - 1 VIEW  COMPARISON:  03/25/2014  FINDINGS: Gaseous distension of the large bowel loops noted. A large desiccated stool ball is noted within the rectum. The patient's feeding tube is no longer visualized.  IMPRESSION: 1. Feeding tube is no longer visualized and may have been pulled back into the esophagus. 2. No change in gaseous distension of large bowel loops with desiccated stool in the rectum.   Electronically Signed   By: Kerby Moors M.D.   On: 03/27/2014 15:58    Assessment:  1 AKI, hemodynamically mediated v. immune complex mediated from infx  2 MSSA bacteremia 3 UTI, E. Coli 4 Anasarca/hypoalbuminemia 5 ? proteinuria  Plan: 1 Give blood prn 2 Restart Albumin infusions 3 Tx infxs as you are doing     Mckynzi Cammon C 03/27/2014, 8:08 PM

## 2014-03-27 NOTE — Progress Notes (Signed)
Md updated via phone Pt with large amount of emesis in the bed and floor. Will hold tube feeding per new orders. Pt cleaned and medicated for c/o nausea.

## 2014-03-27 NOTE — Progress Notes (Signed)
Patient ID: [name], female   DOB: Dec 11, 1955, 58 y.o.   MRN: [id number]    Patient Name: Robin Schroeder Date of Encounter: 03/27/2014     Active Problems:   DM2 (diabetes mellitus, type 2)   Physical deconditioning   Hypoalbuminemia   Hyponatremia   Severe sepsis   Acute UTI   Protein-calorie malnutrition, severe   Sacral decubitus ulcer   Acute pulmonary edema   Effusion into joint   ST elevation myocardial infarction (STEMI) of inferolateral wall, initial episode of care   Severe sepsis with septic shock   Diabetic ulcer of left foot associated with diabetes mellitus due to underlying condition   Osteomyelitis   Ulcer of heel   Acute osteomyelitis of femur   Septic arthritis of knee, left   STEMI (ST elevation myocardial infarction)   Ulcer of left heel   Metabolic acidosis   Bacteremia   Knee pain    SUBJECTIVE  C/o knee pain. Denies chest pain or sob.  CURRENT MEDS . antiseptic oral rinse  7 mL Mouth Rinse q12n4p  . aspirin  81 mg Oral Daily  . atorvastatin  20 mg Oral q1800  . bisacodyl  10 mg Rectal Once  . calcium carbonate  1 tablet Oral Once  .  ceFAZolin (ANCEF) IV  2 g Intravenous Q8H  . chlorhexidine  15 mL Mouth Rinse BID  . enoxaparin (LOVENOX) injection  40 mg Subcutaneous Q24H  . feeding supplement (ENSURE COMPLETE)  237 mL Oral TID BM  . feeding supplement (GLUCERNA 1.2 CAL)  1,000 mL Per Tube Q24H  . fludrocortisone  0.1 mg Oral Daily  . hydrocortisone cream   Topical BID  . insulin aspart  0-5 Units Subcutaneous QHS  . insulin aspart  0-9 Units Subcutaneous TID WC  . [START ON 03/28/2014] insulin glargine  12 Units Subcutaneous Daily  . mirtazapine  15 mg Oral QHS  . OLANZapine zydis  5 mg Oral QHS  . predniSONE  20 mg Oral Q breakfast  . sodium bicarbonate  650 mg Oral TID  . sodium chloride  10-40 mL Intracatheter Q12H    OBJECTIVE  Filed Vitals:   03/27/14 0621 03/27/14 1034 03/27/14 1441 03/27/14 1500  BP: 123/67   125/66 120/64  Pulse: 84  82 77  Temp: 97.6 F (36.4 C)  97.4 F (36.3 C) 97.2 F (36.2 C)  TempSrc: Oral  Oral Oral  Resp: 16  16 16   Height:      Weight:  205 lb 4 oz (93.1 kg)    SpO2: 99%  98% 99%    Intake/Output Summary (Last 24 hours) at 03/27/14 1548 Last data filed at 03/27/14 1440  Gross per 24 hour  Intake   1995 ml  Output    300 ml  Net   1695 ml   Filed Weights   03/25/14 0400 03/26/14 0422 03/27/14 1034  Weight: 197 lb 5 oz (89.5 kg) 201 lb 11.5 oz (91.5 kg) 205 lb 4 oz (93.1 kg)    PHYSICAL EXAM  General: Pleasant, NAD. Neuro: Alert and oriented X 3. Moves all extremities spontaneously. Psych: Normal affect. HEENT:  Normal  Neck: Supple without bruits or JVD. Lungs:  Resp regular and unlabored, CTA. Heart: RRR no s3, s4, or murmurs. Abdomen: Soft, non-tender, non-distended, BS + x 4.  Extremities: No clubbing, cyanosis. DP/PT/Radials 2+ and equal bilaterally. Bilateral peripheral edema.  Accessory Clinical Findings  CBC  Recent Labs  03/26/14 0421 03/27/14 0450  WBC 2.9* 3.1*  HGB 7.8* 8.1*  HCT 23.9* 25.0*  MCV 85.7 85.6  PLT 79* 92*   Basic Metabolic Panel  Recent Labs  03/26/14 0421 03/27/14 0450  NA 138 141  K 3.5* 4.1  CL 106 108  CO2 20 20  GLUCOSE 103* 74  BUN 52* 55*  CREATININE 1.26* 1.27*  CALCIUM 7.5* 7.4*  MG 2.2  --   PHOS 4.3  --    Liver Function Tests  Recent Labs  03/25/14 1130 03/26/14 0421  AST  --  52*  ALT  --  <5  ALKPHOS  --  426*  BILITOT  --  0.8  PROT  --  5.0*  ALBUMIN 1.5* 1.3*   No results for input(s): LIPASE, AMYLASE in the last 72 hours. Cardiac Enzymes No results for input(s): CKTOTAL, CKMB, CKMBINDEX, TROPONINI in the last 72 hours. BNP Invalid input(s): POCBNP D-Dimer No results for input(s): DDIMER in the last 72 hours. Hemoglobin A1C No results for input(s): HGBA1C in the last 72 hours. Fasting Lipid Panel No results for input(s): CHOL, HDL, LDLCALC, TRIG, CHOLHDL, LDLDIRECT  in the last 72 hours. Thyroid Function Tests  Recent Labs  03/25/14 0440  TSH 5.080*    TELE  nsr  Radiology/Studies  Dg Chest Port 1 View  03/23/2014   CLINICAL DATA:  Pulmonary edema. Hypertension. Diabetes. Coronary artery disease.  EXAM: PORTABLE CHEST - 1 VIEW  COMPARISON:  03/21/2014  FINDINGS: Mild worsening of infiltrate seen in the left retrocardiac lung base. Mild opacity also seen in the medial right lung base is suspicious for developing infiltrate. Tiny left pleural effusion cannot be excluded Heart size remains within normal limits.  IMPRESSION: Increased infiltrate in the left retrocardiac lung base, and probably the medial right lung base as well.   Electronically Signed   By: Myles Rosenthal M.D.   On: 03/23/2014 07:09   Dg Chest Port 1 View  03/21/2014   CLINICAL DATA:  Acute pulmonary edema. History of hypertension, diabetes and coronary artery disease. Initial encounter.  EXAM: PORTABLE CHEST - 1 VIEW  COMPARISON:  Radiographs 01/27/2014 and 03/20/2014.  FINDINGS: 1929 hr. The heart size and mediastinal contours are stable. There is persistent elevation of the left hemidiaphragm with increased left basilar airspace disease. The right lung is clear. There is no pneumothorax or significant pleural effusion. No acute osseous findings are demonstrated. Degenerative changes are present throughout the thoracic spine and at both shoulders. Telemetry leads overlie the chest.  IMPRESSION: Progressive left lower lobe airspace disease. Although in part chronic, this could reflect superimposed aspiration. No overt pulmonary edema.   Electronically Signed   By: Roxy Horseman M.D.   On: 03/21/2014 08:10   Dg Chest Port 1 View  03/20/2014   CLINICAL DATA:  Fever and sepsis  EXAM: PORTABLE CHEST - 1 VIEW  COMPARISON:  March 19, 2014 study obtained earlier in the day ; January 27, 2014  FINDINGS: There is mild cardiomegaly with mild generalized interstitial edema. There is atelectatic change in  the left base with small left effusion. Pulmonary vascularity is within normal limits. Bones are osteoporotic.  IMPRESSION: Cardiomegaly with interstitial edema. These findings are felt to represent a degree of congestive heart failure. Atelectasis left base. Small left effusion.   Electronically Signed   By: Bretta Bang M.D.   On: 03/20/2014 09:34   Dg Chest Port 1 View  (if Code Sepsis Called)  03/19/2014   CLINICAL DATA:  Failure to thrive  EXAM: PORTABLE CHEST - 1 VIEW  COMPARISON:  01/27/2014  FINDINGS: Patient is rotated considerably to the left and the study was not repeated. Study is significantly limited. Lungs are grossly clear.  IMPRESSION: Recommend repeat study. The patient is rotated considerably. Lungs are grossly clear.   Electronically Signed   By: Marlan Palau M.D.   On: 03/19/2014 14:51   Dg Knee Left Port  03/20/2014   CLINICAL DATA:  Bilateral foot can knee pain, initial evaluation, multiple open wounds due to bacterial infection, bilateral knee effusions  EXAM: PORTABLE LEFT KNEE - 1-2 VIEW  COMPARISON:  None.  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 The medial cortex of the medial tibial plateau demonstrates loss of definition. This is concerning for the possibility of osteomyelitis. The cortex along the medial margin of the distal medial femoral condyles is also indistinct. There is medial soft tissue swelling at the joint line.  IMPRESSION: The findings are concerning for cellulitis with underlying osteomyelitis medially.   Electronically Signed   By: Esperanza Heir M.D.   On: 03/20/2014 17:51   Dg Knee Right Port  03/20/2014   CLINICAL DATA:  Bilateral knee pain  EXAM: PORTABLE RIGHT KNEE - 1-2 VIEW  COMPARISON:  None.  FINDINGS: Severe tricompartmental degenerative change is noted. Small suprapatellar effusion. No fracture or dislocation. No radiopaque foreign body.  IMPRESSION: Severe tricompartmental degenerative change.   Electronically  Signed   By: Christiana Pellant M.D.   On: 03/20/2014 17:51   Dg Abd Portable 1v  03/25/2014   CLINICAL DATA:  Feeding tube placement.  EXAM: PORTABLE ABDOMEN - 1 VIEW  COMPARISON:  None.  FINDINGS: Distal tip of feeding tube is seen in the expected position of the proximal stomach. Visualized bowel appears normal.  IMPRESSION: Distal tip of feeding tube is seen in expected position of the proximal stomach.   Electronically Signed   By: Roque Lias M.D.   On: 03/25/2014 09:56   Dg Foot Complete Left  03/20/2014   CLINICAL DATA:  Left heel wound  EXAM: LEFT FOOT - COMPLETE 3+ VIEW  COMPARISON:  None.  FINDINGS: There is extensive artifact from material overlying the foot which significantly decreases sensitivity and specificity for detection of osseous or soft tissue abnormalities. No grossly displaced fracture is identified. Bones are subjectively osteopenic. Dorsal soft tissue swelling is evident with plantar calcaneal spurring and vascular calcifications noted.  IMPRESSION: Suboptimal visualization due to support apparatus, but with nonspecific dorsal soft tissue swelling identified.   Electronically Signed   By: Christiana Pellant M.D.   On: 03/20/2014 17:07   Dg Foot Complete Right  03/20/2014   CLINICAL DATA:  Bilateral foot pain, open skin wound  EXAM: RIGHT FOOT COMPLETE - 3+ VIEW  COMPARISON:  None.  FINDINGS: Bones are subjectively osteopenic. Positioning is suboptimal due to patient immobility and portable technique. Degenerative changes are noted at the midfoot. No fracture or dislocation allowing for positioning. Plantar calcaneal spurring noted. There is soft tissue swelling involving the dorsum of the foot and adjacent to the lateral malleolus. No radiopaque foreign body.  IMPRESSION: Subjective osteopenia without acute osseous abnormality.  Dorsal soft tissue swelling and swelling over the lateral malleolus. Correlate for cellulitis or skin abnormalities in this area.   Electronically Signed   By:  Christiana Pellant M.D.   On: 03/20/2014 17:51   Dg Fluoro Guide Ndl Plc/bx  03/26/2014   CLINICAL DATA:  58 year old female with recent hospitalization with MSSA  bacteremia of unknown source and possible rheumatoid arthritis. She is currently admitted with sepsis of unclear source requiring pressors. Possible osteomyelitis of the medial left knee on x-ray. Bilateral knee joint aspirations are requested to evaluate for infectious source  EXAM: FLUOROSCOPICALLY GUIDED BILATERAL KNEE JOINT ASPIRATIONS.  FLUOROSCOPY TIME:  17 seconds  PROCEDURE: After a thorough discussion of risks and benefits of the procedure including the general risks of bleeding, infection, injury to nerves, blood vessels, adjacent structures, and allergic reaction, verbal and written consent was obtained. Verbal consent was obtained by Dr. Archer Asa. Specific risks of this procedure included false positive and false negative results and introduction of infection into the joint. Time-out form was completed when appropriate.  Attention was first turned to the left knee.  The joint was localized under fluoroscopy. A skin entry site medial to the joint was chosen and marked. The skin was prepped and draped in the usual sterile fashion with Betadine soap. Local anesthesia and deep anesthesia was provided with 1% lidocaine without epinephrine. Careful attention was paid not to inject bacteriostatic lidocaine into the joint. Under fluoroscopic guidance an 18 gauge spinal needle was advanced into the joint. Location within the suprapatellar recess was confirmed by a gentle hand injection of 3 mL Omnipaque 300. Vigorous aspiration was performed which yielded approximately 5 cc of bloody synovial fluid.  The needle was removed and a sterile dressing applied.  Attention was next turned to the right knee.  The joint was localized under fluoroscopy. A skin entry site lateral to the joint was chosen and marked. The skin was prepped and draped in the usual  sterile fashion with Betadine soap. Local anesthesia and deep anesthesia was provided with 1% lidocaine without epinephrine. Careful attention was paid not to inject bacteriostatic lidocaine into the joint. Under fluoroscopic guidance an 18 gauge spinal needle was advanced into the joint. Location within the suprapatellar recess was confirmed by a gentle hand injection of 3 mL Omnipaque 300. Vigorous aspiration was performed which yielded approximately 5 cc of bloody synovial fluid.  Both synovial fluid samples were sent for Gram stain and culture.  IMPRESSION: Technically successful fluoroscopic guided aspiration of the bilateral knee joints.   Electronically Signed   By: Malachy Moan M.D.   On: 03/26/2014 08:39   Dg Fluoro Guide Ndl Plc/bx  03/26/2014   CLINICAL DATA:  58 year old female with recent hospitalization with MSSA bacteremia of unknown source and possible rheumatoid arthritis. She is currently admitted with sepsis of unclear source requiring pressors. Possible osteomyelitis of the medial left knee on x-ray. Bilateral knee joint aspirations are requested to evaluate for infectious source  EXAM: FLUOROSCOPICALLY GUIDED BILATERAL KNEE JOINT ASPIRATIONS.  FLUOROSCOPY TIME:  17 seconds  PROCEDURE: After a thorough discussion of risks and benefits of the procedure including the general risks of bleeding, infection, injury to nerves, blood vessels, adjacent structures, and allergic reaction, verbal and written consent was obtained. Verbal consent was obtained by Dr. Archer Asa. Specific risks of this procedure included false positive and false negative results and introduction of infection into the joint. Time-out form was completed when appropriate.  Attention was first turned to the left knee.  The joint was localized under fluoroscopy. A skin entry site medial to the joint was chosen and marked. The skin was prepped and draped in the usual sterile fashion with Betadine soap. Local anesthesia and  deep anesthesia was provided with 1% lidocaine without epinephrine. Careful attention was paid not to inject bacteriostatic lidocaine into the joint. Under  fluoroscopic guidance an 18 gauge spinal needle was advanced into the joint. Location within the suprapatellar recess was confirmed by a gentle hand injection of 3 mL Omnipaque 300. Vigorous aspiration was performed which yielded approximately 5 cc of bloody synovial fluid.  The needle was removed and a sterile dressing applied.  Attention was next turned to the right knee.  The joint was localized under fluoroscopy. A skin entry site lateral to the joint was chosen and marked. The skin was prepped and draped in the usual sterile fashion with Betadine soap. Local anesthesia and deep anesthesia was provided with 1% lidocaine without epinephrine. Careful attention was paid not to inject bacteriostatic lidocaine into the joint. Under fluoroscopic guidance an 18 gauge spinal needle was advanced into the joint. Location within the suprapatellar recess was confirmed by a gentle hand injection of 3 mL Omnipaque 300. Vigorous aspiration was performed which yielded approximately 5 cc of bloody synovial fluid.  Both synovial fluid samples were sent for Gram stain and culture.  IMPRESSION: Technically successful fluoroscopic guided aspiration of the bilateral knee joints.   Electronically Signed   By: Malachy Moan M.D.   On: 03/26/2014 08:39    ASSESSMENT AND PLAN  1. Staph bacteremia 2. Inferior MI 3. Acute osteo of the femur Rec: no additional cardiac rec's. She may proceed with MRI and moderate sedation. No cardiac workup indicated. As outpatient, treat with ASA, metoprolol 12.5 mg twice daily.   Gregg Taylor,M.D.  03/27/2014 3:48 PM

## 2014-03-27 NOTE — Progress Notes (Addendum)
PROGRESS NOTE    Robin PORTEE ZOX:[id number] DOB: 02-11-1956 DOA: 03/19/2014 PCP: No PCP Per Patient  HPI/Brief narrative 58 year old female patient with history of hypertension, obesity, DM, chronic diastolic CHF, possible rheumatoid arthritis, recent hospitalization for MSSA bacteremia of unknown source/neg TEE- cleared after Vancomycin (PCN allergy), went to SNF and then discharged from their to home, presented with complaints of not eating or drinking 4 days and husband was concerned that patient was trying to hurt herself and she was IVC and brought to ED where she was assessed as sepsis of unclear source and admitted to stepdown for further management. Patient went into septic shock requiring pressors. She also developed STEMI but poor overall candidate for aggressive intervention i.e. Currently. CCM, ID and cardiology assisting with care. Shock resolved- pressors DC'ed 12/7 AM.   Assessment/Plan:  1. Septic shock secondary to MSSA bacteremia: Continue cefazolin. Source not clear-? Status post arthrocentesis. Cell count from B/L knee synovial fluid consistent with inflammatory arthritis, culture pending. Infectious disease follow-up appreciated. . Shock improved/resolved and DC'd pressors 12/7 AM. Discontinued IV hydrocortisone (tapering dose), continue Fludrocortisone. As per ID, will eventually need repeat TEE when medically stable. Obtain MRI of left knee,foot ,ankle , attempt to do this under no sedation today.if not tolerated then Needs conscious sedation,. Surveillance blood cultures 2 (12/4): Negative to date.  PICC line  placed after ID  Approval.  Stop date would be 04/21/14.   2. ? Osteomyelitis of medial left knee on x-ray: ID follow-up appreciated. MRI of left knee when stable. .   3. Left lower lobe airspace disease: Possibly chronic.? Doubt that the patient has pneumonia.    4.  hyponatremia: Resolved.    5. Inferiolateral wall STEMI: Patient has not complained of  chest pain since admission. Troponins steadily increased to >20 on 12/3. Not candidate for aggressive intervention i.e. cath secondary to unstable status from shock, anemia and thrombocytopenia. Patient treated with 48 hours of IV heparin-DC'd 12/7 and was placed on aspirin-but DC due to bleeding . Will eventually need cardiac catheterization when stable. 2-D echo shows EF of 50-55% the distal inferior and apical hypokinesis that is new.. .  Cards will clear her for MRI as conscious sedation is needed, also determine timing of TEE .   6. IVC/?? Intention to self harm: IVC removed . Psychiatry consultation note reviewed . Discussed with Dr. Tenny Craw 12/5. Patient has capacity to make her own medical decisions and living arrangement. Meds adjusted by psyche . No evidence of imminent risk to self. Sitter discontinued.   7. Anasarca: Secondary to ongoing illnesses, malnutrition and hypo-albuminemia. Treat underlying cause and attempt to improve nutritional status. Tube feeding held  because of nausea. Can restart tube feeding after 4-6 hours if nausea resolved.   8. Right sacral decubitus: Does not appear grossly infected. Wound care consultation requested. 9.  10. Hypertension: Patient in septic shock. Shock is improving.                                                                                         11. Type II DM: Uncontrolled. Likely worsened by steroids. Continue  Lantus-  continue SSI.   12. Possible rheumatoid arthritis: Patient was on prednisone at home. Patient placed on 20 mg of prednisone. RA related pain was probably the reason she had been bed bound since recent DC from SNF. Recommend an OP Rheumatology consultation appointment be set up for her prior to DC- this can be done when she is stable and approaching DC.   13. Chronic diastolic CHF: Generalized anasarca but probably intravascularly dehydrated due to third spacing.  14. Hypokalemia: Replaced. 15.  16. Anemia: Acute on  chronic Unclear etiology. Baseline hemoglobin probably in the 9 g per DL range. Status post 1 unit PRBC 12/4 with appropriate response. Hemoglobin trending down, now with vaginal bleeding ,obtain stat CBC , type and screen, DC asa and lovenox, tx back to step down for monitoring , and transfuse if unstable or hg < 8.0 in the setting of recent MI    17. Thrombocytopenia: Possibly from acute illness/sepsis. Continues to improve. Follow CBCs.   18. Multiple pressure wounds: Details as per WOC nurse note on 12/4. Patient has left heel DTI, right arm full-thickness abrasion and unstageable right buttock wound. Reconsult wound care because of bleeding sacral wounds. 19.  20. Failure to thrive: Multifactorial 21. Severe deconditioning: Severe protein calorie malnutrition: Dietitian consulted. Patient did start enteral feeding but not tolerating it well. Discussed PEG tube placement with the husband. Do not anticipate that this will be done up until next week after   cardiology has cleared patient for conscious sedation   22. Depressive disorder secondary to general medical condition: Management per psychiatry. zyprexa  added. Requested psychiatry to reassess.    23. Oliguria renal failure-improving after transfusion of albumin : Likely secondary to third spacing of fluids in the setting of severe protein calorie malnutrition and intravascular dehydration. . Patient has been given albumin for 3 consecutive days. Nephrology consult because of worsening renal function   24. E Coli UTI: Continue Ancef- sensitive.   25. Mild non-anion gap metabolic acidosis: Unclear etiology. Start by mouth bicarbonate. Follow BMP in a.m.    Code Status: Limited code Family Communication:  I have been having daily discussion with the husband, recommend palliative care  meeting for goals of care as prognosis is poor. Case management to see the patient would be a candidate for LTAC Disposition Plan: Transfer to stepdown   Today because of vaginal bleeding    Consultants:  Cardiology  ID  Psychiatry  PCCM-Signed off  Procedures:  Arterial line-discontinued  Foley catheter  Antibiotics:  IV Vanc 12/3>DC'd   IV Aztreonam 12/3> 12/4   IV Ancef.  Subjective: Patient bleeding from her buttock, bleeding from a Foley catheter site  worsening generalized anasarca, complaining of heart burn Nauseous, with projectile vomitting multiple times   Objective: Filed Vitals:   03/26/14 1413 03/26/14 2128 03/27/14 0621 03/27/14 1034  BP: 123/61 122/58 123/67   Pulse: 101 87 84   Temp: 97.8 F (36.6 C) 97.9 F (36.6 C) 97.6 F (36.4 C)   TempSrc: Oral Oral Oral   Resp:  16 16   Height:      Weight:    93.1 kg (205 lb 4 oz)  SpO2: 97% 99% 99%    blood pressures actually better than listed bowel-as discussed with nursing systolic in the 100s and map 73   Intake/Output Summary (Last 24 hours) at 03/27/14 1206 Last data filed at 03/27/14 0700  Gross per 24 hour  Intake   2230 ml  Output  0 ml  Net   2230 ml   Filed Weights   03/25/14 0400 03/26/14 0422 03/27/14 1034  Weight: 89.5 kg (197 lb 5 oz) 91.5 kg (201 lb 11.5 oz) 93.1 kg (205 lb 4 oz)     Exam:  General exam: Pleasant middle-aged female, moderately built and obese lying comfortably supine in bed. Respiratory system: Slightly diminished breath sounds in the bases but otherwise clear to auscultation. No increased work of breathing. Cardiovascular system: S1 & S2 heard, RRR. No JVD, murmurs, gallops, clicks. Anasarca. Telemetry: SB in the 50s-SR. Gastrointestinal system: Abdomen is nondistended, soft and nontender. Normal bowel sounds heard. Patient has patchy ecchymosis over right upper quadrant. Central nervous system: Alert and oriented 3. No focal neurological deficits. Extremities: Symmetric 5 x 5 power. Left knewithout acute findings today. Skin: Dry appearing right sacral decubitus without signs of overt infection. Patient  has skin tear over the right posterior upper arm. Left heel DTI without signs of acute infection. Psychiatry: Pleasant and comfortable today. Denies suicidal ideations.   Data Reviewed: Basic Metabolic Panel:  Recent Labs Lab 03/22/14 0314 03/23/14 0350 03/24/14 0335 03/26/14 0421 03/27/14 0450  NA 133* 132* 132* 138 141  K 4.0 3.9 3.8 3.5* 4.1  CL 103 101 101 106 108  CO2 18* 16* 18* 20 20  GLUCOSE 276* 239* 123* 103* 74  BUN 24* 30* 38* 52* 55*  CREATININE 0.48* 0.69 0.97 1.26* 1.27*  CALCIUM 7.4* 7.6* 7.7* 7.5* 7.4*  MG 1.8 1.9  --  2.2  --   PHOS 1.9* 2.7  --  4.3  --    Liver Function Tests:  Recent Labs Lab 03/21/14 0357 03/25/14 1130 03/26/14 0421  AST 72*  --  52*  ALT 15  --  <5  ALKPHOS 203*  --  426*  BILITOT 1.0  --  0.8  PROT 5.4*  --  5.0*  ALBUMIN 1.0* 1.5* 1.3*   No results for input(s): LIPASE, AMYLASE in the last 168 hours. No results for input(s): AMMONIA in the last 168 hours. CBC:  Recent Labs Lab 03/21/14 0357  03/23/14 0350 03/24/14 0335 03/25/14 0440 03/26/14 0421 03/27/14 0450  WBC 5.4  < > 7.7 3.3* 3.7* 2.9* 3.1*  NEUTROABS 4.4  --   --   --   --   --   --   HGB 8.7*  < > 9.9* 8.9* 9.1* 7.8* 8.1*  HCT 26.9*  < > 30.7* 27.3* 27.8* 23.9* 25.0*  MCV 84.3  < > 84.8 82.2 84.5 85.7 85.6  PLT 81*  < > 108* 91* 96* 79* 92*  < > = values in this interval not displayed. Cardiac Enzymes:  Recent Labs Lab 03/24/14 0335  TROPONINI 2.28*   BNP (last 3 results) No results for input(s): PROBNP in the last 8760 hours. CBG:  Recent Labs Lab 03/26/14 1158 03/26/14 1722 03/26/14 1758 03/26/14 2126 03/27/14 0755  GLUCAP 88 69* 101* 73 82    Recent Results (from the past 240 hour(s))  Urine culture     Status: None   Collection Time: 03/19/14  3:04 PM  Result Value Ref Range Status   Specimen Description URINE, CATHETERIZED  Final   Special Requests NONE  Final   Culture  Setup Time   Final    03/20/2014 00:29 Performed at  [facility] Count   Final    >=100,000 COLONIES/ML Performed at [facility]    Culture   Final  ESCHERICHIA COLI Note: Two isolates with different morphologies were identified as the same organism.The most resistant organism was reported. Performed at [facility]    Report Status 03/23/2014 FINAL  Final   Organism ID, Bacteria ESCHERICHIA COLI  Final      Susceptibility   Escherichia coli - MIC*    AMPICILLIN >=32 RESISTANT Resistant     CEFAZOLIN <=4 SENSITIVE Sensitive     CEFTRIAXONE <=1 SENSITIVE Sensitive     CIPROFLOXACIN <=0.25 SENSITIVE Sensitive     GENTAMICIN <=1 SENSITIVE Sensitive     LEVOFLOXACIN <=0.12 SENSITIVE Sensitive     NITROFURANTOIN <=16 SENSITIVE Sensitive     TOBRAMYCIN <=1 SENSITIVE Sensitive     TRIMETH/SULFA >=320 RESISTANT Resistant     PIP/TAZO <=4 SENSITIVE Sensitive     * ESCHERICHIA COLI  Blood Culture (routine x 2)     Status: None   Collection Time: 03/19/14  3:23 PM  Result Value Ref Range Status   Specimen Description BLOOD RIGHT WRIST  Final   Special Requests BOTTLES DRAWN AEROBIC AND ANAEROBIC 5 ML  Final   Culture  Setup Time   Final    03/20/2014 07:48 Performed at [facility]    Culture   Final    STAPHYLOCOCCUS AUREUS Note: RIFAMPIN AND GENTAMICIN SHOULD NOT BE USED AS SINGLE DRUGS FOR TREATMENT OF STAPH INFECTIONS. Note: Gram Stain Report Called to,Read Back By and Verified With: FAYE S BY INGRAM A 03/20/14 11AM Performed at [facility]    Report Status 03/22/2014 FINAL  Final   Organism ID, Bacteria STAPHYLOCOCCUS AUREUS  Final      Susceptibility   Staphylococcus aureus - MIC*    CLINDAMYCIN <=0.25 SENSITIVE Sensitive     ERYTHROMYCIN <=0.25 SENSITIVE Sensitive     GENTAMICIN <=0.5 SENSITIVE Sensitive     LEVOFLOXACIN <=0.12 SENSITIVE Sensitive     OXACILLIN 0.5 SENSITIVE Sensitive     PENICILLIN RESISTANT      RIFAMPIN <=0.5 SENSITIVE Sensitive     TRIMETH/SULFA  <=10 SENSITIVE Sensitive     VANCOMYCIN <=0.5 SENSITIVE Sensitive     TETRACYCLINE <=1 SENSITIVE Sensitive     MOXIFLOXACIN <=0.25 SENSITIVE Sensitive     * STAPHYLOCOCCUS AUREUS  Blood Culture (routine x 2)     Status: None   Collection Time: 03/19/14  3:23 PM  Result Value Ref Range Status   Specimen Description BLOOD BLOOD LEFT FOREARM  Final   Special Requests BOTTLES DRAWN AEROBIC AND ANAEROBIC 5 ML  Final   Culture  Setup Time   Final    03/20/2014 07:51 Performed at [facility]    Culture   Final    STAPHYLOCOCCUS AUREUS Note: SUSCEPTIBILITIES PERFORMED ON PREVIOUS CULTURE WITHIN THE LAST 5 DAYS. Note: Gram Stain Report Called to,Read Back By and Verified With: FAYE S BY INGRAM A 03/20/14 11AM Performed at [facility]    Report Status 03/22/2014 FINAL  Final  Culture, blood (routine x 2)     Status: None   Collection Time: 03/20/14  5:30 PM  Result Value Ref Range Status   Specimen Description BLOOD RIGHT HAND  Final   Special Requests BOTTLES DRAWN AEROBIC ONLY 8CC  Final   Culture  Setup Time   Final    03/21/2014 01:05 Performed at [facility]    Culture   Final    NO GROWTH 5 DAYS Performed at [facility]    Report Status 03/27/2014 FINAL  Final  Culture,  blood (routine x 2)     Status: None   Collection Time: 03/20/14  5:37 PM  Result Value Ref Range Status   Specimen Description BLOOD LEFT ARM  Final   Special Requests   Final    BOTTLES DRAWN AEROBIC ONLY BLUE BOTTLE 9 CC, RED BOTTLE 1 CC   Culture  Setup Time   Final    03/21/2014 01:06 Performed at [facility]    Culture   Final    NO GROWTH 5 DAYS Performed at [facility]    Report Status 03/27/2014 FINAL  Final  Body fluid culture     Status: None (Preliminary result)   Collection Time: 03/25/14  6:37 PM  Result Value Ref Range Status   Specimen Description SYNOVIAL RIGHT KNEE  Final   Special Requests NONE  Final   Gram Stain   Final    FEW  WBC PRESENT,BOTH PMN AND MONONUCLEAR NO ORGANISMS SEEN Performed at [facility]    Culture NO GROWTH Performed at [facility]   Final   Report Status PENDING  Incomplete  Body fluid culture     Status: None (Preliminary result)   Collection Time: 03/25/14  6:46 PM  Result Value Ref Range Status   Specimen Description SYNOVIAL LEFT KNEE  Final   Special Requests NONE  Final   Gram Stain   Final    ABUNDANT WBC PRESENT,BOTH PMN AND MONONUCLEAR NO ORGANISMS SEEN Performed at [facility]    Culture NO GROWTH Performed at [facility]   Final   Report Status PENDING  Incomplete         Studies: Dg Fluoro Guide Ndl Plc/bx  03/26/2014   CLINICAL DATA:  58 year old female with recent hospitalization with MSSA bacteremia of unknown source and possible rheumatoid arthritis. She is currently admitted with sepsis of unclear source requiring pressors. Possible osteomyelitis of the medial left knee on x-ray. Bilateral knee joint aspirations are requested to evaluate for infectious source  EXAM: FLUOROSCOPICALLY GUIDED BILATERAL KNEE JOINT ASPIRATIONS.  FLUOROSCOPY TIME:  17 seconds  PROCEDURE: After a thorough discussion of risks and benefits of the procedure including the general risks of bleeding, infection, injury to nerves, blood vessels, adjacent structures, and allergic reaction, verbal and written consent was obtained. Verbal consent was obtained by Dr. Archer Asa. Specific risks of this procedure included false positive and false negative results and introduction of infection into the joint. Time-out form was completed when appropriate.  Attention was first turned to the left knee.  The joint was localized under fluoroscopy. A skin entry site medial to the joint was chosen and marked. The skin was prepped and draped in the usual sterile fashion with Betadine soap. Local anesthesia and deep anesthesia was provided with 1% lidocaine without epinephrine.  Careful attention was paid not to inject bacteriostatic lidocaine into the joint. Under fluoroscopic guidance an 18 gauge spinal needle was advanced into the joint. Location within the suprapatellar recess was confirmed by a gentle hand injection of 3 mL Omnipaque 300. Vigorous aspiration was performed which yielded approximately 5 cc of bloody synovial fluid.  The needle was removed and a sterile dressing applied.  Attention was next turned to the right knee.  The joint was localized under fluoroscopy. A skin entry site lateral to the joint was chosen and marked. The skin was prepped and draped in the usual sterile fashion with Betadine soap. Local anesthesia and deep anesthesia was provided with 1% lidocaine without epinephrine.  Careful attention was paid not to inject bacteriostatic lidocaine into the joint. Under fluoroscopic guidance an 18 gauge spinal needle was advanced into the joint. Location within the suprapatellar recess was confirmed by a gentle hand injection of 3 mL Omnipaque 300. Vigorous aspiration was performed which yielded approximately 5 cc of bloody synovial fluid.  Both synovial fluid samples were sent for Gram stain and culture.  IMPRESSION: Technically successful fluoroscopic guided aspiration of the bilateral knee joints.   Electronically Signed   By: Malachy Moan M.D.   On: 03/26/2014 08:39   Dg Fluoro Guide Ndl Plc/bx  03/26/2014   CLINICAL DATA:  58 year old female with recent hospitalization with MSSA bacteremia of unknown source and possible rheumatoid arthritis. She is currently admitted with sepsis of unclear source requiring pressors. Possible osteomyelitis of the medial left knee on x-ray. Bilateral knee joint aspirations are requested to evaluate for infectious source  EXAM: FLUOROSCOPICALLY GUIDED BILATERAL KNEE JOINT ASPIRATIONS.  FLUOROSCOPY TIME:  17 seconds  PROCEDURE: After a thorough discussion of risks and benefits of the procedure including the general risks of  bleeding, infection, injury to nerves, blood vessels, adjacent structures, and allergic reaction, verbal and written consent was obtained. Verbal consent was obtained by Dr. Archer Asa. Specific risks of this procedure included false positive and false negative results and introduction of infection into the joint. Time-out form was completed when appropriate.  Attention was first turned to the left knee.  The joint was localized under fluoroscopy. A skin entry site medial to the joint was chosen and marked. The skin was prepped and draped in the usual sterile fashion with Betadine soap. Local anesthesia and deep anesthesia was provided with 1% lidocaine without epinephrine. Careful attention was paid not to inject bacteriostatic lidocaine into the joint. Under fluoroscopic guidance an 18 gauge spinal needle was advanced into the joint. Location within the suprapatellar recess was confirmed by a gentle hand injection of 3 mL Omnipaque 300. Vigorous aspiration was performed which yielded approximately 5 cc of bloody synovial fluid.  The needle was removed and a sterile dressing applied.  Attention was next turned to the right knee.  The joint was localized under fluoroscopy. A skin entry site lateral to the joint was chosen and marked. The skin was prepped and draped in the usual sterile fashion with Betadine soap. Local anesthesia and deep anesthesia was provided with 1% lidocaine without epinephrine. Careful attention was paid not to inject bacteriostatic lidocaine into the joint. Under fluoroscopic guidance an 18 gauge spinal needle was advanced into the joint. Location within the suprapatellar recess was confirmed by a gentle hand injection of 3 mL Omnipaque 300. Vigorous aspiration was performed which yielded approximately 5 cc of bloody synovial fluid.  Both synovial fluid samples were sent for Gram stain and culture.  IMPRESSION: Technically successful fluoroscopic guided aspiration of the bilateral knee  joints.   Electronically Signed   By: Malachy Moan M.D.   On: 03/26/2014 08:39        Scheduled Meds: . albumin human  12.5 g Intravenous Once  . antiseptic oral rinse  7 mL Mouth Rinse q12n4p  . aspirin  81 mg Oral Daily  . atorvastatin  20 mg Oral q1800  . bisacodyl  10 mg Rectal Once  .  ceFAZolin (ANCEF) IV  2 g Intravenous Q8H  . chlorhexidine  15 mL Mouth Rinse BID  . collagenase   Topical Daily  . enoxaparin (LOVENOX) injection  40 mg Subcutaneous Q24H  . feeding supplement (  ENSURE COMPLETE)  237 mL Oral TID BM  . feeding supplement (GLUCERNA 1.2 CAL)  1,000 mL Per Tube Q24H  . fludrocortisone  0.1 mg Oral Daily  . hydrocortisone cream   Topical BID  . insulin aspart  0-5 Units Subcutaneous QHS  . insulin aspart  0-9 Units Subcutaneous TID WC  . [START ON 03/28/2014] insulin glargine  12 Units Subcutaneous Daily  . mirtazapine  15 mg Oral QHS  . OLANZapine zydis  5 mg Oral QHS  . predniSONE  20 mg Oral Q breakfast  . sodium bicarbonate  650 mg Oral TID  . sodium chloride  10-40 mL Intracatheter Q12H   Continuous Infusions: . 0.9 % NaCl with KCl 20 mEq / L 75 mL/hr at 03/26/14 2207  . phenylephrine (NEO-SYNEPHRINE) Adult infusion Stopped (03/23/14 0800)    Active Problems:   DM2 (diabetes mellitus, type 2)   Physical deconditioning   Hypoalbuminemia   Hyponatremia   Severe sepsis   Acute UTI   Protein-calorie malnutrition, severe   Sacral decubitus ulcer   Acute pulmonary edema   Effusion into joint   ST elevation myocardial infarction (STEMI) of inferolateral wall, initial episode of care   Severe sepsis with septic shock   Diabetic ulcer of left foot associated with diabetes mellitus due to underlying condition   Osteomyelitis   Ulcer of heel   Acute osteomyelitis of femur   Septic arthritis of knee, left   STEMI (ST elevation myocardial infarction)   Ulcer of left heel   Metabolic acidosis   Bacteremia   Knee pain    Time spent: 40  minutes.    Richarda Overlie, MD [facility] Pager [phone]  If 7PM-7AM, please contact night-coverage [email or web address] Password [email or web address] 03/27/2014, 12:06 PM    LOS: 8 days

## 2014-03-27 NOTE — Progress Notes (Signed)
Inpatient Diabetes Program Recommendations  AACE/ADA: New Consensus Statement on Inpatient Glycemic Control (2013)  Target Ranges:  Prepandial:   less than 140 mg/dL      Peak postprandial:   less than 180 mg/dL (1-2 hours)      Critically ill patients:  140 - 180 mg/dL     Results for AYLISSA, HEINEMANN (MRN [id number]) as of 03/27/2014 08:50  Ref. Range 03/26/2014 07:50 03/26/2014 11:58 03/26/2014 17:22 03/26/2014 17:58 03/26/2014 21:26  Glucose-Capillary Latest Range: 70-99 mg/dL 95 88 69 (L) 094 (H) 73    Results for JAMMY, STLOUIS (MRN [id number]) as of 03/27/2014 08:50  Ref. Range 03/27/2014 07:55  Glucose-Capillary Latest Range: 70-99 mg/dL 82     **Patient with Mild Hypoglycemia yesterday at 5pm.  **Stopped IV steroids on 03/24/14.     MD- Please consider decreasing Lantus to 12 units daily in the AM    Will follow Ambrose Finland RN, MSN, CDE Diabetes Coordinator Inpatient Diabetes Program Team Pager: [phone] (8a-10p)

## 2014-03-27 NOTE — Consult Note (Signed)
WOC wound consult note Reason for Consult:Numerous areas of skin breakdown, the most significant is at the Right buttocks and left medial heel,sDTIs Wound type:pressure, perfusion Pressure Ulcer POA: Yes Measurement:Right buttocks:  11cm x 6cm with no depth.  Area is purple/maroon in color and is indicative of a deeper tissue injury than we are able to assess at this time.  Right lateral foot;  2cm x 1cm area of eschar.  Right lateral LE:  2cm x 1cm x 0.2cm pink, moist tissue loss.  Left medial heel:  3cm x 4cm area indicative of a sDTI (unstageable).  Purple/Maroon in color. Right lateral knee:  1.5cm x 2cm x 0.2cm pink, moist area of partial thickness tissue loss.  Right antecubital space skin tear: 1.5cm x 5cm x 0.2cm, pink, moist tissue loss. Wound bed:As described above Drainage (amount, consistency, odor) serous Periwound: intact, dry, edematous. Dressing procedure/placement/frequency: I have provided a Prevalon boot set for pressure redistribution, and a therapeutic mattress with low air loss feature as she is difficult to position and prefers the supine and right buttock position.  I will provide guidance for turning and repositioning as well as the skin care products desired for incontinence care. Topical therapy is best addressed with soft silicone foam dressings. It is noted that this woman is still very ill and that recovery of her integumentary system is contingent upon her overall recovery and status.  In fact, it is expected taht the ulcers will not improve and may worsen, extend if her current status (perfusion, nutrition, infection) doe not change. WOC nursing team will not follow, but will remain available to this patient, the nursing and medical teams.  Please re-consult if needed. Thanks, Ladona Mow, MSN, RN, GNP, [place], [facility]ON[facility] [phone])

## 2014-03-27 NOTE — Progress Notes (Signed)
MD given update regarding Pt's status. Pt with further episodes of vomiting with one episode of projectile vomiting. Pt also noted to have more bleeding around catheter area bright red with few small clots noted. Abd x ray also read to MD. Pt's VS BP 119/64 pulse 74. Resp  18.  New orders given by MD

## 2014-03-28 ENCOUNTER — Inpatient Hospital Stay (HOSPITAL_COMMUNITY): Payer: Medicaid Other

## 2014-03-28 ENCOUNTER — Encounter (HOSPITAL_COMMUNITY): Payer: Self-pay | Admitting: Radiology

## 2014-03-28 DIAGNOSIS — R161 Splenomegaly, not elsewhere classified: Secondary | ICD-10-CM

## 2014-03-28 DIAGNOSIS — R630 Anorexia: Secondary | ICD-10-CM | POA: Insufficient documentation

## 2014-03-28 DIAGNOSIS — M069 Rheumatoid arthritis, unspecified: Secondary | ICD-10-CM

## 2014-03-28 DIAGNOSIS — Z515 Encounter for palliative care: Secondary | ICD-10-CM | POA: Insufficient documentation

## 2014-03-28 DIAGNOSIS — E43 Unspecified severe protein-calorie malnutrition: Secondary | ICD-10-CM

## 2014-03-28 DIAGNOSIS — R112 Nausea with vomiting, unspecified: Secondary | ICD-10-CM | POA: Insufficient documentation

## 2014-03-28 DIAGNOSIS — M0500 Felty's syndrome, unspecified site: Secondary | ICD-10-CM

## 2014-03-28 LAB — FOLATE: Folate: 5.1 ng/mL

## 2014-03-28 LAB — IRON AND TIBC
Iron: 31 ug/dL — ABNORMAL LOW (ref 42–135)
SATURATION RATIOS: 34 % (ref 20–55)
TIBC: 91 ug/dL — ABNORMAL LOW (ref 250–470)
UIBC: 60 ug/dL — ABNORMAL LOW (ref 125–400)

## 2014-03-28 LAB — RETICULOCYTES
RBC.: 2.42 MIL/uL — AB (ref 3.87–5.11)
RETIC COUNT ABSOLUTE: 77.4 10*3/uL (ref 19.0–186.0)
RETIC CT PCT: 3.2 % — AB (ref 0.4–3.1)

## 2014-03-28 LAB — COMPREHENSIVE METABOLIC PANEL
AST: 46 U/L — ABNORMAL HIGH (ref 0–37)
Albumin: 1.6 g/dL — ABNORMAL LOW (ref 3.5–5.2)
Alkaline Phosphatase: 392 U/L — ABNORMAL HIGH (ref 39–117)
Anion gap: 13 (ref 5–15)
BILIRUBIN TOTAL: 1.1 mg/dL (ref 0.3–1.2)
BUN: 59 mg/dL — ABNORMAL HIGH (ref 6–23)
CALCIUM: 7.1 mg/dL — AB (ref 8.4–10.5)
CO2: 19 mEq/L (ref 19–32)
Chloride: 111 mEq/L (ref 96–112)
Creatinine, Ser: 1.33 mg/dL — ABNORMAL HIGH (ref 0.50–1.10)
GFR calc non Af Amer: 42 mL/min — ABNORMAL LOW (ref >90)
GFR, EST AFRICAN AMERICAN: 49 mL/min — AB (ref >90)
Glucose, Bld: 64 mg/dL — ABNORMAL LOW (ref 70–99)
Potassium: 5.2 mEq/L (ref 3.7–5.3)
SODIUM: 143 meq/L (ref 137–147)
Total Protein: 4.8 g/dL — ABNORMAL LOW (ref 6.0–8.3)

## 2014-03-28 LAB — SAVE SMEAR

## 2014-03-28 LAB — GLUCOSE, CAPILLARY
GLUCOSE-CAPILLARY: 67 mg/dL — AB (ref 70–99)
Glucose-Capillary: 105 mg/dL — ABNORMAL HIGH (ref 70–99)
Glucose-Capillary: 78 mg/dL (ref 70–99)
Glucose-Capillary: 91 mg/dL (ref 70–99)

## 2014-03-28 LAB — PREPARE RBC (CROSSMATCH)

## 2014-03-28 LAB — CBC
HCT: 22.1 % — ABNORMAL LOW (ref 36.0–46.0)
HEMOGLOBIN: 7 g/dL — AB (ref 12.0–15.0)
MCH: 27.7 pg (ref 26.0–34.0)
MCHC: 31.7 g/dL (ref 30.0–36.0)
MCV: 87.4 fL (ref 78.0–100.0)
Platelets: 79 10*3/uL — ABNORMAL LOW (ref 150–400)
RBC: 2.53 MIL/uL — AB (ref 3.87–5.11)
RDW: 19.6 % — ABNORMAL HIGH (ref 11.5–15.5)
WBC: 3.3 10*3/uL — ABNORMAL LOW (ref 4.0–10.5)

## 2014-03-28 LAB — PROTIME-INR
INR: 1.7 — ABNORMAL HIGH (ref 0.00–1.49)
Prothrombin Time: 20.1 seconds — ABNORMAL HIGH (ref 11.6–15.2)

## 2014-03-28 LAB — VITAMIN B12: VITAMIN B 12: 1699 pg/mL — AB (ref 211–911)

## 2014-03-28 LAB — FERRITIN: Ferritin: 1545 ng/mL — ABNORMAL HIGH (ref 10–291)

## 2014-03-28 LAB — APTT: APTT: 36 s (ref 24–37)

## 2014-03-28 MED ORDER — DIPHENHYDRAMINE HCL 25 MG PO CAPS
25.0000 mg | ORAL_CAPSULE | Freq: Once | ORAL | Status: AC
  Administered 2014-03-28: 25 mg via ORAL
  Filled 2014-03-28: qty 1

## 2014-03-28 MED ORDER — ACETAMINOPHEN 325 MG PO TABS
650.0000 mg | ORAL_TABLET | Freq: Once | ORAL | Status: AC
  Administered 2014-03-28: 650 mg via ORAL
  Filled 2014-03-28: qty 2

## 2014-03-28 MED ORDER — FUROSEMIDE 10 MG/ML IJ SOLN
20.0000 mg | Freq: Once | INTRAMUSCULAR | Status: DC
  Filled 2014-03-28: qty 2

## 2014-03-28 MED ORDER — SODIUM CHLORIDE 0.9 % IV SOLN
Freq: Once | INTRAVENOUS | Status: AC
  Administered 2014-03-28: 11:00:00 via INTRAVENOUS

## 2014-03-28 MED ORDER — DEXTROSE 50 % IV SOLN: 1.0000 | Freq: Once | INTRAVENOUS | Status: AC

## 2014-03-28 MED ORDER — PANTOPRAZOLE SODIUM 40 MG IV SOLR
40.0000 mg | Freq: Two times a day (BID) | INTRAVENOUS | Status: DC
  Administered 2014-03-28 – 2014-04-03 (×12): 40 mg via INTRAVENOUS
  Filled 2014-03-28 (×13): qty 40

## 2014-03-28 MED ORDER — ALBUMIN HUMAN 25 % IV SOLN
12.5000 g | Freq: Three times a day (TID) | INTRAVENOUS | Status: DC
  Administered 2014-03-28 – 2014-03-30 (×7): 12.5 g via INTRAVENOUS
  Filled 2014-03-28 (×5): qty 50
  Filled 2014-03-28: qty 100
  Filled 2014-03-28: qty 50

## 2014-03-28 MED ORDER — DEXTROSE 50 % IV SOLN
INTRAVENOUS | Status: AC
  Administered 2014-03-28: 07:00:00
  Filled 2014-03-28: qty 50

## 2014-03-28 NOTE — Progress Notes (Signed)
Patient not taking pills nor allowing NG Tube to be placed. Platelets also 79. Patient states is having nausea. PRN Zofran given and still states nausea. Attempted to give pills but dry-heaves and says this is what caused her to get sick last time and does not want anything. Consulting civil engineer at bedside for conversation. Text-paged MD Abrol to make aware.

## 2014-03-28 NOTE — Consult Note (Signed)
Patient VZ:CHYIFO Robin Schroeder      DOB: [birth year]      YDX:[id number]     Consult Note from the Palliative Medicine Team at [facility]    Consult Requested by: Dr Susie Cassette     PCP: No PCP Per Patient Reason for Consultation: GOC     Phone Number:None  Assessment/Recommendations: 58 yo female with multiple medical problems as below who presented with fevers, decreased PO intake. Found to have sepsis, STEMI, splenomegaly in hospital.   1.  Code Status: Partial. No CPR/Intubation  2. Goals of Care:  [name] really does not engage me in any converstion. This is likely multifactorial with medical and potentially psychiatric issues as well.  Certainly her clinical trajectory is concerning.  Oncology eval today for splenomegaly, cytopenias.  She allowed me to speak with husband today and I was able to reach him via phone.  We will meet tomorrow morning at 730.  With recurrent infection, comorbid issues, poor functional/nutritional status, her prognosis certainly seems poor.     3. Symptom Management:   1. Nausea- Only symptom she will tell me much about today.  Has PRN zofran which she seemed to respond to.  Can consider addition of compazine or phenergan if needed.  Remeron also beneficial but may need to target dopamine receptors if refractory nausea. 2. Appetite Loss- She is already on steroids, zyprexa, remeron. Doubt addition of other agents would make significant impact.    4. Psychosocial/Spiritual: Will meet with husband tomorrow   Brief HPI: 58 yo female with PMHx of HTN, DM, diastolic HF, ?RA, recent MSSA bacteremia with unknown source who presented on 03/19/14 with fevers, decreased PO intake. There was also concern that she wanted to harm herself.  Hypotensive, elevated Troponin on admission and also found to have MSSA bacteremia.  Later felt to have STEMI with continued increase in cardiac enzymes.  Stay also complicated by hemodynamic instability, AKI, continued FTT, and episode of acute  blood loss today.  Imaging reveals splenomegaly for which Hematology has been consulted.  ID following and still no source of MSSA identified. Difficult to evaluate for source with MRI because of her medical fragility.  Transferred to SDU today.  Had some nausea. Still appears a bit lethargic and wont engage me in conversation. Okay with me contacting her husband [name].  Psychiatry has followed for mood disorder and started multiple medications that also have benefit of appetite stimulation.  Did not tolerate attempts at enteral feeding well.      PMH:  Past Medical History  Diagnosis Date  . Hypertension   . Diabetes mellitus without complication   . Arthritis   . Coronary artery disease     pt unaware     PSH: Past Surgical History  Procedure Laterality Date  . Tee without cardioversion N/A 02/02/2014    Procedure: TRANSESOPHAGEAL ECHOCARDIOGRAM (TEE);  Surgeon: Donato Schultz, MD;  Location: [facility] ENDOSCOPY;  Service: Cardiovascular;  Laterality: N/A;   I have reviewed the FH and SH and  If appropriate update it with new information. Allergies  Allergen Reactions  . Penicillins Rash   Scheduled Meds: . albumin human  12.5 g Intravenous TID  . antiseptic oral rinse  7 mL Mouth Rinse q12n4p  . atorvastatin  20 mg Oral q1800  . bisacodyl  10 mg Rectal Once  .  ceFAZolin (ANCEF) IV  2 g Intravenous Q8H  . chlorhexidine  15 mL Mouth Rinse BID  . feeding supplement (ENSURE COMPLETE)  237 mL  Oral TID BM  . feeding supplement (GLUCERNA 1.2 CAL)  1,000 mL Per Tube Q24H  . fludrocortisone  0.1 mg Oral Daily  . hydrocortisone cream   Topical BID  . insulin aspart  0-5 Units Subcutaneous QHS  . insulin aspart  0-9 Units Subcutaneous TID WC  . insulin glargine  12 Units Subcutaneous Daily  . mirtazapine  15 mg Oral QHS  . OLANZapine zydis  5 mg Oral QHS  . predniSONE  20 mg Oral Q breakfast  . sodium bicarbonate  650 mg Oral TID  . sodium chloride  10-40 mL Intracatheter Q12H    Continuous Infusions: . 0.9 % NaCl with KCl 20 mEq / L 10 mL/hr at 03/28/14 0700  . phenylephrine (NEO-SYNEPHRINE) Adult infusion Stopped (03/23/14 0800)   PRN Meds:.acetaminophen **OR** acetaminophen, chlorpheniramine-HYDROcodone, HYDROmorphone (DILAUDID) injection, ondansetron **OR** ondansetron (ZOFRAN) IV, sodium chloride    BP 115/51 mmHg  Pulse 88  Temp(Src) 98.6 F (37 C) (Oral)  Resp 22  Ht 5\' 4"  (1.626 m)  Wt 90.5 kg (199 lb 8.3 oz)  BMI 34.23 kg/m2  SpO2 93%   PPS: 20   Intake/Output Summary (Last 24 hours) at 03/28/14 1357 Last data filed at 03/28/14 1200  Gross per 24 hour  Intake 1431.33 ml  Output    980 ml  Net 451.33 ml    Physical Exam:  General: Drowsy but can open eyes and respond briefly to questions. Does not make efforts to engage me in conversation HEENT:  [place], mmm Chest:   CTAB CVS:RRR Abdomen: obese, NT Ext: anasarca Psych: withdrawn  Labs: CBC    Component Value Date/Time   WBC 3.3* 03/28/2014 0430   WBC 5.1 05/09/2013 1233   RBC 2.42* 03/28/2014 0825   RBC 2.53* 03/28/2014 0430   RBC 4.84 05/09/2013 1233   HGB 7.0* 03/28/2014 0430   HGB 14.4 05/09/2013 1233   HCT 22.1* 03/28/2014 0430   HCT 47.4 05/09/2013 1233   PLT 79* 03/28/2014 0430   MCV 87.4 03/28/2014 0430   MCV 97.9* 05/09/2013 1233   MCH 27.7 03/28/2014 0430   MCH 29.8 05/09/2013 1233   MCHC 31.7 03/28/2014 0430   MCHC 30.4* 05/09/2013 1233   RDW 19.6* 03/28/2014 0430   LYMPHSABS 0.7 03/21/2014 0357   MONOABS 0.3 03/21/2014 0357   EOSABS 0.0 03/21/2014 0357   BASOSABS 0.0 03/21/2014 0357    BMET    Component Value Date/Time   NA 143 03/28/2014 0430   K 5.2 03/28/2014 0430   CL 111 03/28/2014 0430   CO2 19 03/28/2014 0430   GLUCOSE 64* 03/28/2014 0430   BUN 59* 03/28/2014 0430   CREATININE 1.33* 03/28/2014 0430   CREATININE 0.44* 02/12/2014 1232   CALCIUM 7.1* 03/28/2014 0430   GFRNONAA 42* 03/28/2014 0430   GFRNONAA >89 02/12/2014 1232   GFRAA 49*  03/28/2014 0430   GFRAA >89 02/12/2014 1232    CMP     Component Value Date/Time   NA 143 03/28/2014 0430   K 5.2 03/28/2014 0430   CL 111 03/28/2014 0430   CO2 19 03/28/2014 0430   GLUCOSE 64* 03/28/2014 0430   BUN 59* 03/28/2014 0430   CREATININE 1.33* 03/28/2014 0430   CREATININE 0.44* 02/12/2014 1232   CALCIUM 7.1* 03/28/2014 0430   PROT 4.8* 03/28/2014 0430   ALBUMIN 1.6* 03/28/2014 0430   AST 46* 03/28/2014 0430   ALT <5 03/28/2014 0430   ALKPHOS 392* 03/28/2014 0430   BILITOT 1.1 03/28/2014 0430   GFRNONAA  42* 03/28/2014 0430   GFRNONAA >89 02/12/2014 1232   GFRAA 49* 03/28/2014 0430   GFRAA >89 02/12/2014 1232   12/4 R KNEE XR IMPRESSION: Severe tricompartmental degenerative change.  12/4 L Knee XR IMPRESSION: The findings are concerning for cellulitis with underlying osteomyelitis medially.  12/4 R Foot XR IMPRESSION: Subjective osteopenia without acute osseous abnormality.  Dorsal soft tissue swelling and swelling over the lateral malleolus. Correlate for cellulitis or skin abnormalities in this area.  12/4 L Foot XR IMPRESSION: Suboptimal visualization due to support apparatus, but with nonspecific dorsal soft tissue swelling identified.    12/12 CT Abd/Pelvis IMPRESSION: 1. Small, left greater right, bilateral pleural effusions, small to moderate ascites and diffuse subcutaneous edema. Findings reflect anasarca of unclear etiology. 2. Left lower lobe lung base opacity, which may all be atelectasis. Consider pneumonia given the provided history. 3. Abnormal spleen. Spleen is enlarged to a maximum of 17.7 cm. It shows multiple hypo attenuating areas that may reflect infarcts but are nonspecific. Cause of the splenomegaly is unclear. Possible etiologies include infection, hemolytic anemias, neoplastic disease is including leukemia and lymphoma and areas metabolic disorders. Increased venous drainage pressure such as from splenic  vein thrombosis should also be considered. 4. Other chronic findings as detailed. No other evidence of a source of infection.  12/12 Abd US IMPRESSION: 1. Thickened endometrium for this patient's age. In the setting of post-menopausal bleeding, endometrial sampling is indicated to exclude carcinoma. If results are benign, sonohysterogram should be considered for focal lesion work-up. (Ref: Radiological Reasoning: Algorithmic Workup of Abnormal Vaginal Bleeding with Endovaginal Sonography and Sonohysterography. AJR 2008; 161:W96-04) 2. No uterine masses. Neither ovary visualized. Small to moderate amount of ascites.  Total Time: 50 minutes  Greater than 50%  of this time was spent counseling and coordinating care related to the above assessment and plan, extensive review of records.  Orvis Brill D.O. Palliative Medicine Team at [facility]  Pager: [phone] Team Phone: [phone]

## 2014-03-28 NOTE — Progress Notes (Signed)
Assessment:  1 AKI, hemodynamically mediated v. immune complex mediated from infx  2 MSSA bacteremia 3 UTI, E. Coli 4 Anasarca/hypoalbuminemia 5 Proteinuria of uncertain significance 6 Anemia  Plan: 1 Blood transfusion planned for today 2 Restart Albumin infusions 3 Tx infxs as you are doing 4 Hold diuretics  Subjective: Interval History: ABLA  Objective: Vital signs in last 24 hours: Temp:  [97.2 F (36.2 C)-98.6 F (37 C)] 98.6 F (37 C) (12/12 0749) Pulse Rate:  [77-99] 87 (12/12 0600) Resp:  [16-31] 25 (12/12 0600) BP: (112-142)/(42-66) 129/57 mmHg (12/12 0600) SpO2:  [92 %-99 %] 94 % (12/12 0600) Weight:  [90.5 kg (199 lb 8.3 oz)-93.1 kg (205 lb 4 oz)] 90.5 kg (199 lb 8.3 oz) (12/11 1825) Weight change:   Intake/Output from previous day: 12/11 0701 - 12/12 0700 In: 1275 [I.V.:975; IV Piggyback:300] Out: 695 [Urine:695] Intake/Output this shift:    General appearance: alert and cooperative GI: soft, non-tender; bowel sounds normal; no masses,  no organomegaly difuse anasarca  Lab Results:  Recent Labs  03/27/14 1700 03/28/14 0430  WBC 5.2 3.3*  HGB 8.5* 7.0*  HCT 26.3* 22.1*  PLT 113* 79*   BMET:  Recent Labs  03/27/14 0450 03/28/14 0430  NA 141 143  K 4.1 5.2  CL 108 111  CO2 20 19  GLUCOSE 74 64*  BUN 55* 59*  CREATININE 1.27* 1.33*  CALCIUM 7.4* 7.1*   No results for input(s): PTH in the last 72 hours. Iron Studies: No results for input(s): IRON, TIBC, TRANSFERRIN, FERRITIN in the last 72 hours. Studies/Results: Dg Abd 1 View  03/27/2014   CLINICAL DATA:  Severe nausea and vomiting.  EXAM: ABDOMEN - 1 VIEW  COMPARISON:  03/25/2014  FINDINGS: Gaseous distension of the large bowel loops noted. A large desiccated stool ball is noted within the rectum. The patient's feeding tube is no longer visualized.  IMPRESSION: 1. Feeding tube is no longer visualized and may have been pulled back into the esophagus. 2. No change in gaseous distension of  large bowel loops with desiccated stool in the rectum.   Electronically Signed   By: Signa Kell M.D.   On: 03/27/2014 15:58     LOS: 9 days   Jacinta Penalver C 03/28/2014,8:37 AM

## 2014-03-28 NOTE — Progress Notes (Addendum)
PROGRESS NOTE    Robin Schroeder:[id number] DOB: 02-11-1956 DOA: 03/19/2014 PCP: No PCP Per Patient  HPI/Brief narrative 58 year old female patient with history of hypertension, obesity, DM, chronic diastolic CHF, possible rheumatoid arthritis, recent hospitalization for MSSA bacteremia of unknown source/neg TEE- cleared after Vancomycin (PCN allergy), went to SNF and then discharged from their to home, presented with complaints of not eating or drinking 4 days and husband was concerned that patient was trying to hurt herself and she was IVC and brought to ED where she was assessed as sepsis of unclear source and admitted to stepdown for further management. Patient went into septic shock requiring pressors. She also developed STEMI but poor overall candidate for aggressive intervention i.e. Currently. CCM, ID and cardiology assisting with care. Shock resolved- pressors DC'ed 12/7 AM.   Assessment/Plan:  1. Septic shock secondary to MSSA bacteremia: Continue cefazolin. Source not clear-? Status post arthrocentesis. Cell count from B/L knee synovial fluid consistent with inflammatory arthritis, culture pending. Infectious disease follow-up appreciated. . Shock improved/resolved and DC'd pressors 12/7 AM. Discontinued IV hydrocortisone (tapering dose), continue Fludrocortisone. As per ID, will eventually need repeat TEE when medically stable. Obtain MRI of left knee,foot ,ankle , attempt to do this under no sedation today.if not tolerated then Needs conscious sedation,. Surveillance blood cultures 2 (12/4): Negative to date.  PICC line  placed after ID  Approval.  Stop date would be 04/21/14.   2. ? Osteomyelitis of medial left knee on x-ray: ID follow-up appreciated. MRI of left knee when stable. .   3. Left lower lobe airspace disease: Possibly chronic.? Doubt that the patient has pneumonia.    4.  hyponatremia: Resolved.    5. Inferiolateral wall STEMI: Patient has not complained of  chest pain since admission. Troponins steadily increased to >20 on 12/3. Not candidate for aggressive intervention i.e. cath secondary to unstable status from shock, anemia and thrombocytopenia. Patient treated with 48 hours of IV heparin-DC'd 12/7 and was placed on aspirin-but DC due to bleeding . Will eventually need cardiac catheterization when stable. 2-D echo shows EF of 50-55% the distal inferior and apical hypokinesis that is new.. .  Cards will clear her for MRI as conscious sedation is needed, also determine timing of TEE .   6. IVC/?? Intention to self harm: IVC removed . Psychiatry consultation note reviewed . Discussed with Dr. Tenny Craw 12/5. Patient has capacity to make her own medical decisions and living arrangement. Meds adjusted by psyche . No evidence of imminent risk to self. Sitter discontinued.   7. Anasarca: Secondary to ongoing illnesses, malnutrition and hypo-albuminemia. Treat underlying cause and attempt to improve nutritional status. Tube feeding held  because of nausea. Can restart tube feeding after 4-6 hours if nausea resolved.   8. Right sacral decubitus: Does not appear grossly infected. Wound care consultation requested. 9.  10. Hypertension: Patient in septic shock. Shock is improving.                                                                                         11. Type II DM: Uncontrolled. Likely worsened by steroids. Continue  Lantus-  continue SSI.   12. Possible rheumatoid arthritis: Patient was on prednisone at home. Patient placed on 20 mg of prednisone. RA related pain was probably the reason she had been bed bound since recent DC from SNF. Recommend an OP Rheumatology consultation appointment be set up for her prior to DC- this can be done when she is stable and approaching DC.   13. Chronic diastolic CHF: Generalized anasarca but probably intravascularly dehydrated due to third spacing.  14. Hypokalemia: Replaced.   15. Anemia: Acute on  chronic. Vaginal bleeding , splenomegaly on CT scan. ,  Baseline hemoglobin probably in the 9 g per DL range. Status post 1 unit PRBC 12/4 with appropriate response. Hemoglobin trending down, now with vaginal bleeding ,obtain stat CBC , type and screen, DC asa and lovenox, tx back to step down for monitoring , and transfusing 2 units of packed red blood cells. Will obtain hematology consultation for splenomegaly and thrombocytopenia..   16. Thrombocytopenia: Possibly from acute illness/sepsis. Continues to improve. Follow CBCs.   17. Multiple pressure wounds: Details as per WOC nurse note on 12/4. Patient has left heel DTI, right arm full-thickness abrasion and unstageable right buttock wound. Reconsult wound care because of bleeding sacral wounds.   18. Failure to thrive: Multifactorial   19. Severe deconditioning: Severe protein calorie malnutrition: Dietitian consulted. Patient did start enteral feeding but not tolerating it well. Discussed PEG tube placement with the husband. Do not anticipate that this will be done up until next week after   cardiology has cleared patient for conscious sedation   20. Depressive disorder secondary to general medical condition: Management per psychiatry. zyprexa  added. Requested psychiatry to reassess.    21. oliguria renal failure-improving after transfusion of albumin, albumin transfusion 3 times a day, holding IV fluids. Appreciate nephrology input : Likely secondary to third spacing of fluids in the setting of severe protein calorie malnutrition and intravascular dehydration. .   22. E Coli UTI: Continue Ancef- sensitive.   23. Mild non-anion gap metabolic acidosis: Unclear etiology. Start by mouth bicarbonate. Follow BMP in a.m.    Code Status: Limited code Family Communication:  I have been having daily discussion with the husband, recommend palliative care  meeting for goals of care as prognosis is poor. Case management to see the patient would  be a candidate for LTAC Disposition Plan: Transfer to stepdown  Today because of vaginal bleeding    Consultants:  Cardiology  ID  Psychiatry  PCCM-Signed off  Procedures:  Arterial line-discontinued  Foley catheter  Antibiotics:  IV Vanc 12/3>DC'd   IV Aztreonam 12/3> 12/4   IV Ancef.  Subjective: Patient bleeding from her buttock, bleeding from a Foley catheter site  worsening generalized anasarca, patient still has some mild vaginal bleeding Nausea resolved   Objective: Filed Vitals:   03/28/14 0800 03/28/14 1000 03/28/14 1030 03/28/14 1100  BP: 120/56 118/64 115/45 115/51  Pulse: 92 120 90 88  Temp:   98.8 F (37.1 C) 98.6 F (37 C)  TempSrc:   Oral Oral  Resp: 23 19 23 22   Height:      Weight:      SpO2: 95% 94% 93% 93%   blood pressures actually better than listed bowel-as discussed with nursing systolic in the 100s and map 73   Intake/Output Summary (Last 24 hours) at 03/28/14 1212 Last data filed at 03/28/14 1200  Gross per 24 hour  Intake 1431.33 ml  Output    980 ml  Net 451.33  ml   Filed Weights   03/26/14 0422 03/27/14 1034 03/27/14 1825  Weight: 91.5 kg (201 lb 11.5 oz) 93.1 kg (205 lb 4 oz) 90.5 kg (199 lb 8.3 oz)     Exam:  General exam: Pleasant middle-aged female, moderately built and obese lying comfortably supine in bed. Respiratory system: Slightly diminished breath sounds in the bases but otherwise clear to auscultation. No increased work of breathing. Cardiovascular system: S1 & S2 heard, RRR. No JVD, murmurs, gallops, clicks. Anasarca. Telemetry: SB in the 50s-SR. Gastrointestinal system: Abdomen is nondistended, soft and nontender. Normal bowel sounds heard. Patient has patchy ecchymosis over right upper quadrant. Central nervous system: Alert and oriented 3. No focal neurological deficits. Extremities: Symmetric 5 x 5 power. Left knewithout acute findings today. Skin: Dry appearing right sacral decubitus without signs of  overt infection. Patient has skin tear over the right posterior upper arm. Left heel DTI without signs of acute infection. Psychiatry: Pleasant and comfortable today. Denies suicidal ideations.   Data Reviewed: Basic Metabolic Panel:  Recent Labs Lab 03/22/14 0314 03/23/14 0350 03/24/14 0335 03/26/14 0421 03/27/14 0450 03/28/14 0430  NA 133* 132* 132* 138 141 143  K 4.0 3.9 3.8 3.5* 4.1 5.2  CL 103 101 101 106 108 111  CO2 18* 16* 18* GLUCOSE 276* 239* 123* 103* 74 64*  BUN 24* 30* 38* 52* 55* 59*  CREATININE 0.48* 0.69 0.97 1.26* 1.27* 1.33*  CALCIUM 7.4* 7.6* 7.7* 7.5* 7.4* 7.1*  MG 1.8 1.9  --  2.2  --   --   PHOS 1.9* 2.7  --  4.3  --   --    Liver Function Tests:  Recent Labs Lab 03/25/14 1130 03/26/14 0421 03/28/14 0430  AST  --  52* 46*  ALT  --  <5 <5  ALKPHOS  --  426* 392*  BILITOT  --  0.8 1.1  PROT  --  5.0* 4.8*  ALBUMIN 1.5* 1.3* 1.6*   No results for input(s): LIPASE, AMYLASE in the last 168 hours. No results for input(s): AMMONIA in the last 168 hours. CBC:  Recent Labs Lab 03/25/14 0440 03/26/14 0421 03/27/14 0450 03/27/14 1700 03/28/14 0430  WBC 3.7* 2.9* 3.1* 5.2 3.3*  HGB 9.1* 7.8* 8.1* 8.5* 7.0*  HCT 27.8* 23.9* 25.0* 26.3* 22.1*  MCV 84.5 85.7 85.6 85.4 87.4  PLT 96* 79* 92* 113* 79*   Cardiac Enzymes:  Recent Labs Lab 03/24/14 0335  TROPONINI 2.28*   BNP (last 3 results) No results for input(s): PROBNP in the last 8760 hours. CBG:  Recent Labs Lab 03/27/14 1205 03/27/14 1622 03/27/14 2051 03/28/14 0634 03/28/14 0751  GLUCAP 89 81 91 67* 105*    Recent Results (from the past 240 hour(s))  Urine culture     Status: None   Collection Time: 03/19/14  3:04 PM  Result Value Ref Range Status   Specimen Description URINE, CATHETERIZED  Final   Special Requests NONE  Final   Culture  Setup Time   Final    03/20/2014 00:29 Performed at [facility] Count   Final    >=100,000  COLONIES/ML Performed at [facility]    Culture   Final    ESCHERICHIA COLI Note: Two isolates with different morphologies were identified as the same organism.The most resistant organism was reported. Performed at [facility]    Report Status 03/23/2014 FINAL  Final   Organism ID, Bacteria ESCHERICHIA COLI  Final      Susceptibility   Escherichia coli - MIC*    AMPICILLIN >=32 RESISTANT Resistant     CEFAZOLIN <=4 SENSITIVE Sensitive     CEFTRIAXONE <=1 SENSITIVE Sensitive     CIPROFLOXACIN <=0.25 SENSITIVE Sensitive     GENTAMICIN <=1 SENSITIVE Sensitive     LEVOFLOXACIN <=0.12 SENSITIVE Sensitive     NITROFURANTOIN <=16 SENSITIVE Sensitive     TOBRAMYCIN <=1 SENSITIVE Sensitive     TRIMETH/SULFA >=320 RESISTANT Resistant     PIP/TAZO <=4 SENSITIVE Sensitive     * ESCHERICHIA COLI  Blood Culture (routine x 2)     Status: None   Collection Time: 03/19/14  3:23 PM  Result Value Ref Range Status   Specimen Description BLOOD RIGHT WRIST  Final   Special Requests BOTTLES DRAWN AEROBIC AND ANAEROBIC 5 ML  Final   Culture  Setup Time   Final    03/20/2014 07:48 Performed at [facility]    Culture   Final    STAPHYLOCOCCUS AUREUS Note: RIFAMPIN AND GENTAMICIN SHOULD NOT BE USED AS SINGLE DRUGS FOR TREATMENT OF STAPH INFECTIONS. Note: Gram Stain Report Called to,Read Back By and Verified With: FAYE S BY INGRAM A 03/20/14 11AM Performed at [facility]    Report Status 03/22/2014 FINAL  Final   Organism ID, Bacteria STAPHYLOCOCCUS AUREUS  Final      Susceptibility   Staphylococcus aureus - MIC*    CLINDAMYCIN <=0.25 SENSITIVE Sensitive     ERYTHROMYCIN <=0.25 SENSITIVE Sensitive     GENTAMICIN <=0.5 SENSITIVE Sensitive     LEVOFLOXACIN <=0.12 SENSITIVE Sensitive     OXACILLIN 0.5 SENSITIVE Sensitive     PENICILLIN RESISTANT      RIFAMPIN <=0.5 SENSITIVE Sensitive     TRIMETH/SULFA <=10 SENSITIVE Sensitive     VANCOMYCIN <=0.5 SENSITIVE  Sensitive     TETRACYCLINE <=1 SENSITIVE Sensitive     MOXIFLOXACIN <=0.25 SENSITIVE Sensitive     * STAPHYLOCOCCUS AUREUS  Blood Culture (routine x 2)     Status: None   Collection Time: 03/19/14  3:23 PM  Result Value Ref Range Status   Specimen Description BLOOD BLOOD LEFT FOREARM  Final   Special Requests BOTTLES DRAWN AEROBIC AND ANAEROBIC 5 ML  Final   Culture  Setup Time   Final    03/20/2014 07:51 Performed at [facility]    Culture   Final    STAPHYLOCOCCUS AUREUS Note: SUSCEPTIBILITIES PERFORMED ON PREVIOUS CULTURE WITHIN THE LAST 5 DAYS. Note: Gram Stain Report Called to,Read Back By and Verified With: FAYE S BY INGRAM A 03/20/14 11AM Performed at [facility]    Report Status 03/22/2014 FINAL  Final  Culture, blood (routine x 2)     Status: None   Collection Time: 03/20/14  5:30 PM  Result Value Ref Range Status   Specimen Description BLOOD RIGHT HAND  Final   Special Requests BOTTLES DRAWN AEROBIC ONLY 8CC  Final   Culture  Setup Time   Final    03/21/2014 01:05 Performed at [facility]    Culture   Final    NO GROWTH 5 DAYS Performed at [facility]    Report Status 03/27/2014 FINAL  Final  Culture, blood (routine x 2)     Status: None   Collection Time: 03/20/14  5:37 PM  Result Value Ref Range Status   Specimen Description BLOOD LEFT ARM  Final   Special Requests   Final  BOTTLES DRAWN AEROBIC ONLY BLUE BOTTLE 9 CC, RED BOTTLE 1 CC   Culture  Setup Time   Final    03/21/2014 01:06 Performed at [facility]    Culture   Final    NO GROWTH 5 DAYS Performed at [facility]    Report Status 03/27/2014 FINAL  Final  Body fluid culture     Status: None (Preliminary result)   Collection Time: 03/25/14  6:37 PM  Result Value Ref Range Status   Specimen Description SYNOVIAL RIGHT KNEE  Final   Special Requests NONE  Final   Gram Stain   Final    FEW WBC PRESENT,BOTH PMN AND MONONUCLEAR NO ORGANISMS  SEEN Performed at [facility]    Culture   Final    NO GROWTH 2 DAYS Performed at [facility]    Report Status PENDING  Incomplete  Body fluid culture     Status: None (Preliminary result)   Collection Time: 03/25/14  6:46 PM  Result Value Ref Range Status   Specimen Description SYNOVIAL LEFT KNEE  Final   Special Requests NONE  Final   Gram Stain   Final    ABUNDANT WBC PRESENT,BOTH PMN AND MONONUCLEAR NO ORGANISMS SEEN Performed at [facility]    Culture   Final    NO GROWTH 2 DAYS Performed at [facility]    Report Status PENDING  Incomplete         Studies: Ct Abdomen Pelvis Wo Contrast  03/28/2014   CLINICAL DATA:  patient with history of hypertension, obesity, DM, chronic diastolic CHF, possible rheumatoid arthritis, recent hospitalization for MSSA bacteremia of unknown source/neg TEE- cleared after Vancomycin (PCN allergy), went to SNF and then discharged from there to home, presented with complaints of not eating or drinking 4 days and husband was concerned that patient was trying to hurt herself and she was IVC and brought to ED where she was assessed as sepsis of unclear source and admitted to stepdown for further management. Patient went into septic shock requiring pressors.  Patient with renal failure.  No contrast utilized.  EXAM: CT ABDOMEN AND PELVIS WITHOUT CONTRAST  TECHNIQUE: Multidetector CT imaging of the abdomen and pelvis was performed following the standard protocol without IV contrast.  COMPARISON:  None.  FINDINGS: Small bilateral pleural effusions, larger on the left. There is dependent lower lobe opacity, also greater on the left, most likely atelectasis. Left lower lobe pneumonia should be considered given history. There is no pulmonary edema. Heart is normal in size.  There is small amount of low-density material that lies adjacent to the distal esophagus in the lower mediastinum just above the esophageal hiatus. This  measures 3.2 cm x 2.7 cm x 2.8 cm. Is likely a focal collection of fluid. It could reflect an enlarged lymph node.  Unremarkable liver.  Gallbladder is distended. A single gallstone lies in the dependent aspect of the gallbladder. There is no gallbladder wall thickening or convincing pericholecystic inflammation. No bile duct dilation or stone.  Abnormal spleen. Spleen is enlarged measuring 17.7 cm x 6.7 cm x 14.5 cm. There are irregular low-density areas throughout the spleen. These may reflect areas of infarction are nonspecific on this unenhanced study.  Pancreas is partly fatty replaced but otherwise unremarkable. No adrenal masses.  Ill-defined low-density lesion arises from the posterior midpole of the left kidney measuring 2.6 cm. This is likely a cyst. No other renal masses, no stones and no hydronephrosis. Normal  ureters. Bladder is decompressed by a Foley catheter.  Uterus is unremarkable.  No adnexal masses.  No pathologically enlarged lymph nodes.  Small to moderate amount of ascites.  No evidence of an abscess.  No colon or small bowel wall thickening or dilation. No bowel inflammatory change. Normal appendix visualized.  Mild atherosclerotic calcifications noted along a normal caliber abdominal aorta and its branch vessels.  There is diffuse subcutaneous soft tissue edema.  Marked bilateral concentric hip joint space narrowing. Mild degenerative changes noted of the visualized spine. Bones are demineralized. No osteoblastic or osteolytic lesions.  IMPRESSION: 1. Small, left greater right, bilateral pleural effusions, small to moderate ascites and diffuse subcutaneous edema. Findings reflect anasarca of unclear etiology. 2. Left lower lobe lung base opacity, which may all be atelectasis. Consider pneumonia given the provided history. 3. Abnormal spleen. Spleen is enlarged to a maximum of 17.7 cm. It shows multiple hypo attenuating areas that may reflect infarcts but are nonspecific. Cause of the  splenomegaly is unclear. Possible etiologies include infection, hemolytic anemias, neoplastic disease is including leukemia and lymphoma and areas metabolic disorders. Increased venous drainage pressure such as from splenic vein thrombosis should also be considered. 4. Other chronic findings as detailed. No other evidence of a source of infection.   Electronically Signed   By: Amie Portland M.D.   On: 03/28/2014 11:46   Dg Abd 1 View  03/27/2014   CLINICAL DATA:  Severe nausea and vomiting.  EXAM: ABDOMEN - 1 VIEW  COMPARISON:  03/25/2014  FINDINGS: Gaseous distension of the large bowel loops noted. A large desiccated stool ball is noted within the rectum. The patient's feeding tube is no longer visualized.  IMPRESSION: 1. Feeding tube is no longer visualized and may have been pulled back into the esophagus. 2. No change in gaseous distension of large bowel loops with desiccated stool in the rectum.   Electronically Signed   By: Signa Kell M.D.   On: 03/27/2014 15:58   US Pelvis Complete  03/28/2014   CLINICAL DATA:  Vaginal bleeding. Patient did not allow endovaginal imaging.  EXAM: TRANSABDOMINAL ULTRASOUND OF PELVIS  TECHNIQUE: Transabdominal ultrasound examination of the pelvis was performed including evaluation of the uterus, ovaries, adnexal regions, and pelvic cul-de-sac.  COMPARISON:  None.  FINDINGS: Uterus  Measurements: 8.7 cm x 3.8 cm x 5.5 cm. No uterine mass. Cervix is unremarkable.  Endometrium  Thickness: 14 mm.  No focal endometrial mass.  No endometrial fluid.  Right ovary  Not visualized.  No adnexal masses.  Left ovary  Not visualized.  No adnexal masses.  Other findings: The small to moderate amount of ascites is seen adjacent to the uterus and on the right lower and left lower quadrants.  IMPRESSION: 1. Thickened endometrium for this patient's age. In the setting of post-menopausal bleeding, endometrial sampling is indicated to exclude carcinoma. If results are benign,  sonohysterogram should be considered for focal lesion work-up. (Ref: Radiological Reasoning: Algorithmic Workup of Abnormal Vaginal Bleeding with Endovaginal Sonography and Sonohysterography. AJR 2008; 130:Q65-78) 2. No uterine masses. Neither ovary visualized. Small to moderate amount of ascites.   Electronically Signed   By: Amie Portland M.D.   On: 03/28/2014 11:31        Scheduled Meds: . albumin human  12.5 g Intravenous TID  . antiseptic oral rinse  7 mL Mouth Rinse q12n4p  . atorvastatin  20 mg Oral q1800  . bisacodyl  10 mg Rectal Once  .  ceFAZolin (ANCEF) IV  2 g Intravenous Q8H  . chlorhexidine  15 mL Mouth Rinse BID  . feeding supplement (ENSURE COMPLETE)  237 mL Oral TID BM  . feeding supplement (GLUCERNA 1.2 CAL)  1,000 mL Per Tube Q24H  . fludrocortisone  0.1 mg Oral Daily  . hydrocortisone cream   Topical BID  . insulin aspart  0-5 Units Subcutaneous QHS  . insulin aspart  0-9 Units Subcutaneous TID WC  . insulin glargine  12 Units Subcutaneous Daily  . mirtazapine  15 mg Oral QHS  . OLANZapine zydis  5 mg Oral QHS  . predniSONE  20 mg Oral Q breakfast  . sodium bicarbonate  650 mg Oral TID  . sodium chloride  10-40 mL Intracatheter Q12H   Continuous Infusions: . 0.9 % NaCl with KCl 20 mEq / L 10 mL/hr at 03/28/14 0700  . phenylephrine (NEO-SYNEPHRINE) Adult infusion Stopped (03/23/14 0800)    Active Problems:   DM2 (diabetes mellitus, type 2)   Physical deconditioning   Hypoalbuminemia   Hyponatremia   Severe sepsis   Acute UTI   Protein-calorie malnutrition, severe   Sacral decubitus ulcer   Acute pulmonary edema   Effusion into joint   ST elevation myocardial infarction (STEMI) of inferolateral wall, initial episode of care   Severe sepsis with septic shock   Diabetic ulcer of left foot associated with diabetes mellitus due to underlying condition   Osteomyelitis   Ulcer of heel   Acute osteomyelitis of femur   Septic arthritis of knee, left    STEMI (ST elevation myocardial infarction)   Ulcer of left heel   Metabolic acidosis   Bacteremia   Knee pain   Ileus    Time spent: 40 minutes.    Richarda Overlie, MD [facility] Pager [phone]  If 7PM-7AM, please contact night-coverage [email or web address] Password [email or web address] 03/28/2014, 12:12 PM    LOS: 9 days

## 2014-03-28 NOTE — Progress Notes (Signed)
Petechiae covering entire abdomen and vaginal bleeding still occuring. Harder on left side of abdomen. Text-paged MD Abrol to make aware of assessment findings. Patient currently receiving PRBC.

## 2014-03-28 NOTE — Consult Note (Signed)
Referral MD  Reason for Referral: Thrombocytopenia, anemia, splenomegaly, sepsis   Chief Complaint  Patient presents with  . Failure To Thrive  : She cannot give any history  HPI: Robin Schroeder is a very nice 58 year old white female. She was admitted on December 3. She previously had been hospitalized in October for staph bacteremia. She was placed on Ancef. She then went to a nursing home. She subsequently went home area and she came back and was admitted because of not eating or drinking. There is some concern about her try to herself.  She had a temperature of 103.2. She is hypotensive. She started on antibiotics. 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  In late October, her platelet count was 174,000.  Her blood count worsened during the hospitalization. Today, her white count 3.3. Hemoglobin 7. Platelet count 79,000. Patient having some bleeding. She was on aspirin and Lovenox for DVT prophylaxis.  She had a CT of the abdomen done. She does not have split megaly. The liver looked okay I guess. She had a small amount of ascites. There is no lymphadenopathy.    Her chemistry studies show albumin of 1.6. Total protein was 4.8. BUN 59 creatinine 1.33. Potassium 5.2.  Infectious disease is following. She was negative for hepatitis and HIV. Cultures so far show Escherichia coli in the urine. She has staph aureus in the blood.  She has diabetes. She has osteoarthritis.  She had a ST elevation myocardial infarction. I am not sure when this was.  Infectious disease is following. She has MSSA bacteremia. She is on antibiotics.  She apparently has a diagnosis of rheumatoid arthritis. She apparently was on prednisone at home.  We are asked to see her to try to help with her hematologic issues.  She has bad anasarca.    Renal is seeing her.         Past Medical History  Diagnosis Date  . Hypertension   . Diabetes mellitus without  complication   . Arthritis   . Coronary artery disease     pt unaware  :  Past Surgical History  Procedure Laterality Date  . Tee without cardioversion N/A 02/02/2014    Procedure: TRANSESOPHAGEAL ECHOCARDIOGRAM (TEE);  Surgeon: Candee Furbish, MD;  Location: [facility];  Service: Cardiovascular;  Laterality: N/A;  :  Current facility-administered medications: 0.9 % NaCl with KCl 20 mEq/ L  infusion, , Intravenous, Continuous, Reyne Dumas, MD, Last Rate: 10 mL/hr at 03/28/14 0700;  acetaminophen (TYLENOL) tablet 650 mg, 650 mg, Oral, Q6H PRN, 650 mg at 03/22/14 2207 **OR** acetaminophen (TYLENOL) suppository 650 mg, 650 mg, Rectal, Q6H PRN, Oswald Hillock, MD;  albumin human 25 % solution 12.5 g, 12.5 g, Intravenous, TID, Reyne Dumas, MD, 12.5 g at 03/28/14 0856 antiseptic oral rinse (CPC / CETYLPYRIDINIUM CHLORIDE 0.05%) solution 7 mL, 7 mL, Mouth Rinse, q12n4p, Modena Jansky, MD, 7 mL at 03/28/14 1200;  atorvastatin (LIPITOR) tablet 20 mg, 20 mg, Oral, q1800, Dorothy Spark, MD, 20 mg at 03/26/14 1828;  bisacodyl (DULCOLAX) suppository 10 mg, 10 mg, Rectal, Once, Reyne Dumas, MD, 10 mg at 03/26/14 1026 ceFAZolin (ANCEF) IVPB 2 g/50 mL premix, 2 g, Intravenous, Q8H, Mosetta Pigeon, RPH, 2 g at 03/28/14 1201;  chlorhexidine (PERIDEX) 0.12 % solution 15 mL, 15 mL, Mouth Rinse, BID, Modena Jansky, MD, 15 mL at 03/28/14 0800;  chlorpheniramine-HYDROcodone (TUSSIONEX) 10-8 MG/5ML suspension 5 mL, 5 mL, Oral, Q12H PRN, Remo Lipps  Ernestina Patches, MD, 5 mL at 03/22/14 0133 feeding supplement (ENSURE COMPLETE) (ENSURE COMPLETE) liquid 237 mL, 237 mL, Oral, TID BM, Hazle Coca, RD, 237 mL at 03/28/14 1200;  feeding supplement (GLUCERNA 1.2 CAL) liquid 1,000 mL, 1,000 mL, Per Tube, Q24H, Hazle Coca, RD, Last Rate: 30 mL/hr at 03/26/14 1930, 1,000 mL at 03/26/14 1930;  fludrocortisone (FLORINEF) tablet 0.1 mg, 0.1 mg, Oral, Daily, Raylene Miyamoto, MD, 0.1 mg at 03/28/14 1200 hydrocortisone cream  1 %, , Topical, BID, Reyne Dumas, MD;  HYDROmorphone (DILAUDID) injection 0.5-1 mg, 0.5-1 mg, Intravenous, Q4H PRN, Reyne Dumas, MD, 0.5 mg at 03/26/14 1050;  insulin aspart (novoLOG) injection 0-5 Units, 0-5 Units, Subcutaneous, QHS, Modena Jansky, MD, 3 Units at 03/22/14 2206;  insulin aspart (novoLOG) injection 0-9 Units, 0-9 Units, Subcutaneous, TID WC, Modena Jansky, MD, 1 Units at 03/25/14 1249 insulin glargine (LANTUS) injection 12 Units, 12 Units, Subcutaneous, Daily, Reyne Dumas, MD, 12 Units at 03/28/14 1130;  mirtazapine (REMERON SOL-TAB) disintegrating tablet 15 mg, 15 mg, Oral, QHS, Durward Parcel, MD, 15 mg at 03/27/14 2152;  OLANZapine zydis (ZYPREXA) disintegrating tablet 5 mg, 5 mg, Oral, QHS, Durward Parcel, MD, 5 mg at 03/27/14 2152 ondansetron (ZOFRAN) tablet 4 mg, 4 mg, Oral, Q6H PRN, 4 mg at 03/21/14 1352 **OR** ondansetron (ZOFRAN) injection 4 mg, 4 mg, Intravenous, Q6H PRN, Oswald Hillock, MD, 4 mg at 03/28/14 1211;  phenylephrine (NEO-SYNEPHRINE) 10 mg in dextrose 5 % 250 mL (0.04 mg/mL) infusion, 30-200 mcg/min, Intravenous, Titrated, Raylene Miyamoto, MD, Stopped at 03/23/14 0800 predniSONE (DELTASONE) tablet 20 mg, 20 mg, Oral, Q breakfast, Reyne Dumas, MD, 20 mg at 03/28/14 0830;  sodium bicarbonate tablet 650 mg, 650 mg, Oral, TID, Modena Jansky, MD, 650 mg at 03/27/14 1035;  sodium chloride 0.9 % injection 10-40 mL, 10-40 mL, Intracatheter, Q12H, Reyne Dumas, MD, 10 mL at 03/28/14 1159;  sodium chloride 0.9 % injection 10-40 mL, 10-40 mL, Intracatheter, PRN, Reyne Dumas, MD, 10 mL at 03/27/14 0449:  . albumin human  12.5 g Intravenous TID  . antiseptic oral rinse  7 mL Mouth Rinse q12n4p  . atorvastatin  20 mg Oral q1800  . bisacodyl  10 mg Rectal Once  .  ceFAZolin (ANCEF) IV  2 g Intravenous Q8H  . chlorhexidine  15 mL Mouth Rinse BID  . feeding supplement (ENSURE COMPLETE)  237 mL Oral TID BM  . feeding supplement (GLUCERNA 1.2 CAL)   1,000 mL Per Tube Q24H  . fludrocortisone  0.1 mg Oral Daily  . hydrocortisone cream   Topical BID  . insulin aspart  0-5 Units Subcutaneous QHS  . insulin aspart  0-9 Units Subcutaneous TID WC  . insulin glargine  12 Units Subcutaneous Daily  . mirtazapine  15 mg Oral QHS  . OLANZapine zydis  5 mg Oral QHS  . predniSONE  20 mg Oral Q breakfast  . sodium bicarbonate  650 mg Oral TID  . sodium chloride  10-40 mL Intracatheter Q12H  :  Allergies  Allergen Reactions  . Penicillins Rash  :  Family History  Problem Relation Age of Onset  . Hypertension Brother   :  History   Social History  . Marital Status: Married    Spouse Name: N/A    Number of Children: N/A  . Years of Education: N/A   Occupational History  . Not on file.   Social History Main Topics  . Smoking status: Never Smoker   .  Smokeless tobacco: Not on file  . Alcohol Use: No  . Drug Use: No  . Sexual Activity: Not on file   Other Topics Concern  . Not on file   Social History Narrative  :  Pertinent items are noted in HPI.  Exam: Patient Vitals for the past 24 hrs:  BP Temp Temp src Pulse Resp SpO2 Height Weight  03/28/14 1100 (!) 115/51 mmHg 98.6 F (37 C) Oral 88 (!) 22 93 % - -  03/28/14 1030 (!) 115/45 mmHg 98.8 F (37.1 C) Oral 90 (!) 23 93 % - -  03/28/14 1000 118/64 mmHg - - (!) 120 19 94 % - -  03/28/14 0800 (!) 120/56 mmHg - - 92 (!) 23 95 % - -  03/28/14 0749 - 98.6 F (37 C) Oral - - - - -  03/28/14 0600 (!) 129/57 mmHg - - 87 (!) 25 94 % - -  03/28/14 0500 (!) 119/55 mmHg - - 85 (!) 25 94 % - -  03/28/14 0400 (!) 119/52 mmHg 98.5 F (36.9 C) Oral 86 (!) 22 95 % - -  03/28/14 0300 (!) 121/46 mmHg - - 81 (!) 23 92 % - -  03/28/14 0200 (!) 118/42 mmHg - - 85 20 96 % - -  03/28/14 0100 (!) 114/49 mmHg - - 88 (!) 23 95 % - -  03/28/14 0000 (!) 117/49 mmHg 98.2 F (36.8 C) Oral 87 18 95 % - -  03/27/14 2300 (!) 114/50 mmHg - - 88 19 97 % - -  03/27/14 2200 (!) 114/45 mmHg - - 88 17  96 % - -  03/27/14 2100 (!) 112/50 mmHg - - 86 18 97 % - -  03/27/14 2000 (!) 115/47 mmHg 97.8 F (36.6 C) Oral 86 18 96 % - -  03/27/14 1900 (!) 142/66 mmHg - - 99 (!) 31 96 % - -  03/27/14 1825 (!) 122/47 mmHg 98 F (36.7 C) Oral 82 19 97 % _0  (1.626 m) 199 lb 8.3 oz (90.5 kg)  03/27/14 1500 120/64 mmHg 97.2 F (36.2 C) Oral 77 16 99 % - -  03/27/14 1441 125/66 mmHg 97.4 F (36.3 C) Oral 82 16 98 % - -    chronically ill-appearing white female. She is lethargic. She will open her eyes area and she does try to answer simple questions. She has anasarca. There is no obvious lymphadenopathy. I cannot palpate her spleen. Cardiac exam is regular in rhythm. There are no murmurs. Extremities shows 3+ edema in upper and lower extremities. Skin exam shows some pressure sores. Neurological exam is nonfocal.    Recent Labs  03/27/14 1700 03/28/14 0430  WBC 5.2 3.3*  HGB 8.5* 7.0*  HCT 26.3* 22.1*  PLT 113* 79*    Recent Labs  03/27/14 0450 03/28/14 0430  NA 141 143  K 4.1 5.2  CL 108 111  CO2 20 19  GLUCOSE 74 64*  BUN 55* 59*  CREATININE 1.27* 1.33*  CALCIUM 7.4* 7.1*    Blood smear review: Normochromic and normocytic papillation of red blood cells. She has some microcytic and hypochromic cells. There are no nucleated red cells. I see a rare schistocytes. There is no target cells. She has no low formation. White cells are decreased in number. She has no hypersegmented polys. There are no atypical lymphocytes. I see no immature myeloid forms. Platelets are decreased in number. Platelets are well granulated. She has a few large platelets.  Pathology:  None     Assessment and Plan: Mrs. Nied is a 58 year old female. She has multiple, multiple medical problems. Sepsis is the being problem right now.  She has splenomegaly. Given that she has rheumatoid arthritis, I just wonder if she might have Felty's syndrome.  Her blood smear does not show anything that would suggest a  microangiopathic process. As such, I don't think there is anything such as TTP or HUS.  Having sepsis certainly well cause platelets to decrease.  Her platelets are not that low that would cause any kind of bleeding. I agree with stopping the aspirin and Lovenox.  I would think that her prognosis is miserable. Her albumin being so low is clearly a huge problem.  I think she does have bleeding, then I would give her a platelet transfusion. She may have some platelet dysfunction in addition to a quantitative problem.  Again, she is incredibly malnourished. Just being so malnourished and also effect her marrow function.  We will follow along and try to help out as much as possible.  I don't see that she has any obvious hematologic malignancy. I don't think she has myelodysplasia. As such, I don't think a bone marrow biopsy is indicated.  We had a very good prayer session. I will certainly pray hard for her.  Pete E.  Romans 5:3-5

## 2014-03-28 NOTE — Progress Notes (Signed)
MD Abrol also made aware patient not taking anything PO at this time due to refusing. MD stated it was okay and to begin her on Protonix.

## 2014-03-28 NOTE — Progress Notes (Signed)
PT Cancellation Note  Patient Details Name: Robin Schroeder MRN: [id number] DOB: [birth year]   Cancelled Treatment:    Reason Eval/Treat Not Completed: Medical issues which prohibited therapy--Hgb 7.0. Will hold PT today and check back another day. Thanks.    Rebeca Alert, MPT Pager: [phone]

## 2014-03-29 LAB — TYPE AND SCREEN
ABO/RH(D): A POS
Antibody Screen: NEGATIVE
UNIT DIVISION: 0
Unit division: 0

## 2014-03-29 LAB — GLUCOSE, CAPILLARY
GLUCOSE-CAPILLARY: 97 mg/dL (ref 70–99)
Glucose-Capillary: 84 mg/dL (ref 70–99)
Glucose-Capillary: 58 mg/dL — ABNORMAL LOW (ref 70–99)
Glucose-Capillary: 79 mg/dL (ref 70–99)
Glucose-Capillary: 80 mg/dL (ref 70–99)

## 2014-03-29 LAB — COMPREHENSIVE METABOLIC PANEL
ALK PHOS: 309 U/L — AB (ref 39–117)
ALT: <5 U/L (ref 0–35)
ANION GAP: 14 (ref 5–15)
AST: 35 U/L (ref 0–37)
Albumin: 2.4 g/dL — ABNORMAL LOW (ref 3.5–5.2)
BILIRUBIN TOTAL: 1.3 mg/dL — AB (ref 0.3–1.2)
BUN: 67 mg/dL — AB (ref 6–23)
CHLORIDE: 112 meq/L (ref 96–112)
CO2: 19 mEq/L (ref 19–32)
CREATININE: 1.55 mg/dL — AB (ref 0.50–1.10)
Calcium: 7.8 mg/dL — ABNORMAL LOW (ref 8.4–10.5)
GFR calc non Af Amer: 35 mL/min — ABNORMAL LOW (ref >90)
GFR, EST AFRICAN AMERICAN: 41 mL/min — AB (ref >90)
Glucose, Bld: 77 mg/dL (ref 70–99)
POTASSIUM: 3.6 meq/L — AB (ref 3.7–5.3)
Sodium: 145 mEq/L (ref 137–147)
Total Protein: 5.3 g/dL — ABNORMAL LOW (ref 6.0–8.3)

## 2014-03-29 LAB — CBC
HEMATOCRIT: 27.3 % — AB (ref 36.0–46.0)
HEMATOCRIT: 27.2 % — AB (ref 36.0–46.0)
Hemoglobin: 8.9 g/dL — ABNORMAL LOW (ref 12.0–15.0)
Hemoglobin: 8.7 g/dL — ABNORMAL LOW (ref 12.0–15.0)
MCH: 28.6 pg (ref 26.0–34.0)
MCH: 28.2 pg (ref 26.0–34.0)
MCHC: 32.6 g/dL (ref 30.0–36.0)
MCHC: 32 g/dL (ref 30.0–36.0)
MCV: 87.8 fL (ref 78.0–100.0)
MCV: 88 fL (ref 78.0–100.0)
Platelets: 75 10*3/uL — ABNORMAL LOW (ref 150–400)
Platelets: 74 10*3/uL — ABNORMAL LOW (ref 150–400)
RBC: 3.11 MIL/uL — ABNORMAL LOW (ref 3.87–5.11)
RBC: 3.09 MIL/uL — ABNORMAL LOW (ref 3.87–5.11)
RDW: 18.6 % — ABNORMAL HIGH (ref 11.5–15.5)
RDW: 18.6 % — ABNORMAL HIGH (ref 11.5–15.5)
WBC: 3.8 10*3/uL — ABNORMAL LOW (ref 4.0–10.5)
WBC: 3.4 10*3/uL — ABNORMAL LOW (ref 4.0–10.5)

## 2014-03-29 LAB — BODY FLUID CULTURE: Culture: NO GROWTH

## 2014-03-29 LAB — MAGNESIUM: Magnesium: 2.3 mg/dL (ref 1.5–2.5)

## 2014-03-29 LAB — PHOSPHORUS: Phosphorus: 4.7 mg/dL — ABNORMAL HIGH (ref 2.3–4.6)

## 2014-03-29 MED ORDER — METHYLPREDNISOLONE SODIUM SUCC 40 MG IJ SOLR
40.0000 mg | Freq: Every day | INTRAMUSCULAR | Status: DC
  Administered 2014-03-29 – 2014-04-05 (×8): 40 mg via INTRAVENOUS
  Filled 2014-03-29 (×9): qty 1

## 2014-03-29 MED ORDER — DEXTROSE 50 % IV SOLN
INTRAVENOUS | Status: AC
  Administered 2014-03-29: 25 mL
  Filled 2014-03-29: qty 50

## 2014-03-29 MED ORDER — INSULIN ASPART 100 UNIT/ML ~~LOC~~ SOLN
0.0000 [IU] | Freq: Four times a day (QID) | SUBCUTANEOUS | Status: AC
  Administered 2014-03-30 (×3): 2 [IU] via SUBCUTANEOUS

## 2014-03-29 MED ORDER — INSULIN GLARGINE 100 UNIT/ML ~~LOC~~ SOLN
12.0000 [IU] | Freq: Every day | SUBCUTANEOUS | Status: DC
  Administered 2014-03-29 – 2014-04-03 (×6): 12 [IU] via SUBCUTANEOUS
  Filled 2014-03-29 (×6): qty 0.12

## 2014-03-29 MED ORDER — METOCLOPRAMIDE HCL 5 MG/ML IJ SOLN
5.0000 mg | Freq: Three times a day (TID) | INTRAMUSCULAR | Status: DC
  Administered 2014-03-29 – 2014-03-31 (×7): 5 mg via INTRAVENOUS
  Filled 2014-03-29 (×2): qty 1
  Filled 2014-03-29 (×5): qty 2
  Filled 2014-03-29 (×2): qty 1
  Filled 2014-03-29: qty 2

## 2014-03-29 MED ORDER — FAT EMULSION 20 % IV EMUL
250.0000 mL | INTRAVENOUS | Status: AC
  Administered 2014-03-29: 250 mL via INTRAVENOUS
  Filled 2014-03-29: qty 250

## 2014-03-29 MED ORDER — TRACE MINERALS CR-CU-F-FE-I-MN-MO-SE-ZN IV SOLN
INTRAVENOUS | Status: AC
  Administered 2014-03-29: 17:00:00 via INTRAVENOUS
  Filled 2014-03-29: qty 1000

## 2014-03-29 MED ORDER — FENTANYL 12 MCG/HR TD PT72
12.5000 ug | MEDICATED_PATCH | TRANSDERMAL | Status: DC
  Administered 2014-03-29: 12.5 ug via TRANSDERMAL
  Filled 2014-03-29: qty 1

## 2014-03-29 NOTE — Progress Notes (Addendum)
PROGRESS NOTE    Robin Schroeder PPJ:[id number] DOB: 07/21/1955 DOA: 03/19/2014 PCP: No PCP Per Patient  HPI/Brief narrative 58 year old female patient with history of hypertension, obesity, DM, chronic diastolic CHF, possible rheumatoid arthritis, recent hospitalization for MSSA bacteremia of unknown source/neg TEE- cleared after Vancomycin (PCN allergy), went to SNF and then discharged from their to home, presented with complaints of not eating or drinking 4 days and husband was concerned that patient was trying to hurt herself and she was IVC and brought to ED where she was assessed as sepsis of unclear source and admitted to stepdown for further management. Patient went into septic shock requiring pressors. She also developed STEMI but poor overall candidate for aggressive intervention i.e. Currently. CCM, ID and cardiology assisting with care. Shock resolved- pressors DC'ed 12/7 AM.   Assessment/Plan:  1. Septic shock secondary to MSSA bacteremia: Continue cefazolin. Source not clear-? Status post arthrocentesis. Cell count from B/L knee synovial fluid consistent with inflammatory arthritis, culture pending. Infectious disease follow-up appreciated. . Shock improved/resolved and DC'd pressors 12/7 AM. She has been off IV hydrocortisone . Currently on prednisone,  Fludrocortisone. As per ID, will eventually need repeat TEE when medically stable. Obtain MRI of left knee,foot ,ankle , attempt to do this under no sedation today.if not tolerated then Needs conscious sedation,. Surveillance blood cultures 2 (12/4): Negative to date.  PICC line  placed after ID  Approval.  Stop date would be 04/21/14.   2. ? Osteomyelitis of medial left knee on x-ray: ID follow-up appreciated. MRI of left knee when stable. Patient has not been stable enough to have this done .   3. Left lower lobe airspace disease: Possibly chronic.? Doubt that the patient has pneumonia.    4.  hyponatremia: Resolved.     5. Inferiolateral wall STEMI: Patient has not complained of chest pain since admission. Troponins steadily increased to >20 on 12/3. Not candidate for aggressive intervention i.e. cath secondary to unstable status from shock, anemia and thrombocytopenia. Patient treated with 48 hours of IV heparin-DC'd 12/7 and was placed on aspirin-but DC due to bleeding . Will eventually need cardiac catheterization when stable. 2-D echo shows EF of 50-55% the distal inferior and apical hypokinesis that is new.. .  Cards will clear her for MRI as conscious sedation is needed, also determine timing of TEE .   6. IVC/?? Intention to self harm: IVC removed . Psychiatry consultation note reviewed . Discussed with Dr. Harrington Challenger 12/5. Patient has capacity to make her own medical decisions and living arrangement. Meds adjusted by psyche . No evidence of imminent risk to self. Sitter discontinued.   7. Anasarca: Secondary to ongoing illnesses, malnutrition and hypo-albuminemia. Treat underlying cause and attempt to improve nutritional status. Tube feeding held  because of nausea. Intolerant to tube feeding. Therefore switch to TPN.   8. Right sacral decubitus: Does not appear grossly infected. Wound care consultation requested.  9. Hypertension: Patient in septic shock. Shock is improving.                                                                                         10.  Type II DM: Uncontrolled. Likely worsened by steroids. Continue  Lantus-  continue SSI.   11. New diagnosis of rheumatoid arthritis: Patient was on prednisone at home. Patient placed on 20 mg of prednisone. Unable to tolerate by mouth medicines therefore changed to IV Solu-Medrol minimum dose of 40 mg. RA related pain was probably the reason she had been bed bound since recent DC from SNF. Patient will have OP Rheumatology consultation . In the meantime pain not controlled. Will start the patient on a low-dose fentanyl patch to see if it  helps.   12. Chronic diastolic CHF: Generalized anasarca but probably intravascularly dehydrated due to third spacing.  13. Hypokalemia: Replaced.   14. Anemia: Acute on chronic. Vaginal bleeding , splenomegaly on CT scan. ,  Baseline hemoglobin probably in the 9 g per DL range. Status post 1 unit PRBC 12/4 with appropriate response. Hemoglobin trending down, now with vaginal bleeding ,obtain stat CBC , type and screen, DC asa and lovenox, tx back to step down for monitoring , and transfusing 2 units of packed red blood cells. Kindly see hematology consultation note. Recommend platelet transfusion and the patient is actively bleeding. INR 1.7 probably secondary to poor oral intake   15. Thrombocytopenia: Possibly from acute illness/sepsis. 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 No suspicion for myelodysplasia. No indication for bone marrow biopsy. May have some underlying platelet dysfunction contribution to the bleeding. If profoundly bleeding please transfuse platelets.   16. Multiple pressure wounds: Details as per [facility] nurse note on 12/4. Patient has left heel DTI, right arm full-thickness abrasion and unstageable right buttock wound. Reconsult wound care because of bleeding sacral wounds.   17. Failure to thrive: Multifactorial   18. Intractable nausea. Patient started on Protonix IV, discontinued prednisone by mouth, unable to tolerate tube feeding, started on IV Reglan     19. Severe deconditioning: Severe protein calorie malnutrition: Dietitian consulted. Patient did start enteral feeding but not tolerating it well. Discussed PEG tube placement with the husband. Do not anticipate that this will be done up until next week after   cardiology has cleared patient for conscious sedation   20. Depressive disorder secondary to general medical condition: Management per psychiatry. zyprexa  added. Requested psychiatry to reassess.    21. oliguria renal failure-improving  after transfusion of albumin, albumin transfusion 3 times a day, holding IV fluids. Appreciate nephrology input : Likely secondary to third spacing of fluids in the setting of severe protein calorie malnutrition and intravascular dehydration. .   22. E Coli UTI: Continue Ancef- sensitive.   23. Mild non-anion gap metabolic acidosis: Unclear etiology. Start by mouth bicarbonate. Follow BMP in a.m.  DVT: unable to tolerate locenox, will attempt  scd 's   Code Status: Limited code Family Communication:  I have been having daily discussion with the husband, recommend palliative care  meeting for goals of care as prognosis is poor. Case management to see the patient would be a candidate for LTAC Disposition Plan: Continue in stepdown. Start TPN   Consultants:  Cardiology  ID  Psychiatry  PCCM-Signed off  Procedures:  Arterial line-discontinued  Foley catheter  Antibiotics:  IV Vanc 12/3>DC'd   IV Aztreonam 12/3> 12/4   IV Ancef.  Subjective: Patient bleeding from her buttock, bleeding from a Foley catheter site  worsening generalized anasarca, patient still has some mild vaginal bleeding Nausea resolved   Objective: Filed Vitals:   03/29/14 0400 03/29/14 0500 03/29/14 0600 03/29/14 0800  BP: 140/68  121/54 131/57 138/51  Pulse: 94 76 82 87  Temp: 98.9 F (37.2 C)   98 F (36.7 C)  TempSrc: Oral   Oral  Resp: _0 Height:      Weight: 90.3 kg (199 lb 1.2 oz)     SpO2: 96% 92% 97% 94%   blood pressures actually better than listed bowel-as discussed with nursing systolic in the 812X and map 73   Intake/Output Summary (Last 24 hours) at 03/29/14 1219 Last data filed at 03/29/14 0802  Gross per 24 hour  Intake    500 ml  Output    810 ml  Net   -310 ml   Filed Weights   03/27/14 1034 03/27/14 1825 03/29/14 0400  Weight: 93.1 kg (205 lb 4 oz) 90.5 kg (199 lb 8.3 oz) 90.3 kg (199 lb 1.2 oz)     Exam:  General exam: Pleasant middle-aged female,  moderately built and obese lying comfortably supine in bed. Respiratory system: Slightly diminished breath sounds in the bases but otherwise clear to auscultation. No increased work of breathing. Cardiovascular system: S1 & S2 heard, RRR. No JVD, murmurs, gallops, clicks. Anasarca. Telemetry: SB in the 50s-SR. Gastrointestinal system: Abdomen is nondistended, soft and nontender. Normal bowel sounds heard. Patient has patchy ecchymosis over right upper quadrant. Central nervous system: Alert and oriented 3. No focal neurological deficits. Extremities: Symmetric 5 x 5 power. Left knewithout acute findings today. Skin: Dry appearing right sacral decubitus without signs of overt infection. Patient has skin tear over the right posterior upper arm. Left heel DTI without signs of acute infection. Psychiatry: Pleasant and comfortable today. Denies suicidal ideations.   Data Reviewed: Basic Metabolic Panel:  Recent Labs Lab 03/23/14 0350 03/24/14 0335 03/26/14 0421 03/27/14 0450 03/28/14 0430  NA 132* 132* 138 141 143  K 3.9 3.8 3.5* 4.1 5.2  CL 101 101 106 108 111  CO2 16* 18* _1 GLUCOSE 239* 123* 103* 74 64*  BUN 30* 38* 52* 55* 59*  CREATININE 0.69 0.97 1.26* 1.27* 1.33*  CALCIUM 7.6* 7.7* 7.5* 7.4* 7.1*  MG 1.9  --  2.2  --   --   PHOS 2.7  --  4.3  --   --    Liver Function Tests:  Recent Labs Lab 03/25/14 1130 03/26/14 0421 03/28/14 0430  AST  --  52* 46*  ALT  --  <5 <5  ALKPHOS  --  426* 392*  BILITOT  --  0.8 1.1  PROT  --  5.0* 4.8*  ALBUMIN 1.5* 1.3* 1.6*   No results for input(s): LIPASE, AMYLASE in the last 168 hours. No results for input(s): AMMONIA in the last 168 hours. CBC:  Recent Labs Lab 03/27/14 0450 03/27/14 1700 03/28/14 0430 03/29/14 0420 03/29/14 1120  WBC 3.1* 5.2 3.3* 3.8* 3.4*  HGB 8.1* 8.5* 7.0* 8.9* 8.7*  HCT 25.0* 26.3* 22.1* 27.3* 27.2*  MCV 85.6 85.4 87.4 87.8 88.0  PLT 92* 113* 79* 75* 74*   Cardiac Enzymes:  Recent  Labs Lab 03/24/14 0335  TROPONINI 2.28*   BNP (last 3 results) No results for input(s): PROBNP in the last 8760 hours. CBG:  Recent Labs Lab 03/27/14 2051 03/28/14 0634 03/28/14 0751 03/28/14 1122 03/28/14 1706  GLUCAP 91 67* 105* 78 91    Recent Results (from the past 240 hour(s))  Urine culture     Status: None   Collection Time: 03/19/14  3:04 PM  Result Value Ref Range  Status   Specimen Description URINE, CATHETERIZED  Final   Special Requests NONE  Final   Culture  Setup Time   Final    03/20/2014 00:29 Performed at [place] [facility]   Final    >=100,000 COLONIES/ML Performed at [facility]    Culture   Final    ESCHERICHIA COLI Note: Two isolates with different morphologies were identified as the same organism.The most resistant organism was reported. Performed at [facility]    Report Status 03/23/2014 FINAL  Final   Organism ID, Bacteria ESCHERICHIA COLI  Final      Susceptibility   Escherichia coli - MIC*    AMPICILLIN >=32 RESISTANT Resistant     CEFAZOLIN <=4 SENSITIVE Sensitive     CEFTRIAXONE <=1 SENSITIVE Sensitive     CIPROFLOXACIN <=0.25 SENSITIVE Sensitive     GENTAMICIN <=1 SENSITIVE Sensitive     LEVOFLOXACIN <=0.12 SENSITIVE Sensitive     NITROFURANTOIN <=16 SENSITIVE Sensitive     TOBRAMYCIN <=1 SENSITIVE Sensitive     TRIMETH/SULFA >=320 RESISTANT Resistant     PIP/TAZO <=4 SENSITIVE Sensitive     * ESCHERICHIA COLI  Blood Culture (routine x 2)     Status: None   Collection Time: 03/19/14  3:23 PM  Result Value Ref Range Status   Specimen Description BLOOD RIGHT WRIST  Final   Special Requests BOTTLES DRAWN AEROBIC AND ANAEROBIC 5 ML  Final   Culture  Setup Time   Final    03/20/2014 07:48 Performed at [facility]    Culture   Final    STAPHYLOCOCCUS AUREUS Note: RIFAMPIN AND GENTAMICIN SHOULD NOT BE USED AS SINGLE DRUGS FOR TREATMENT OF STAPH INFECTIONS. Note: Gram Stain Report Called  to,Read Back By and Verified With: FAYE S BY INGRAM A 03/20/14 11AM Performed at [facility]    Report Status 03/22/2014 FINAL  Final   Organism ID, Bacteria STAPHYLOCOCCUS AUREUS  Final      Susceptibility   Staphylococcus aureus - MIC*    CLINDAMYCIN <=0.25 SENSITIVE Sensitive     ERYTHROMYCIN <=0.25 SENSITIVE Sensitive     GENTAMICIN <=0.5 SENSITIVE Sensitive     LEVOFLOXACIN <=0.12 SENSITIVE Sensitive     OXACILLIN 0.5 SENSITIVE Sensitive     PENICILLIN RESISTANT      RIFAMPIN <=0.5 SENSITIVE Sensitive     TRIMETH/SULFA <=10 SENSITIVE Sensitive     VANCOMYCIN <=0.5 SENSITIVE Sensitive     TETRACYCLINE <=1 SENSITIVE Sensitive     MOXIFLOXACIN <=0.25 SENSITIVE Sensitive     * STAPHYLOCOCCUS AUREUS  Blood Culture (routine x 2)     Status: None   Collection Time: 03/19/14  3:23 PM  Result Value Ref Range Status   Specimen Description BLOOD BLOOD LEFT FOREARM  Final   Special Requests BOTTLES DRAWN AEROBIC AND ANAEROBIC 5 ML  Final   Culture  Setup Time   Final    03/20/2014 07:51 Performed at [facility]    Culture   Final    STAPHYLOCOCCUS AUREUS Note: SUSCEPTIBILITIES PERFORMED ON PREVIOUS CULTURE WITHIN THE LAST 5 DAYS. Note: Gram Stain Report Called to,Read Back By and Verified With: FAYE S BY INGRAM A 03/20/14 11AM Performed at [facility]    Report Status 03/22/2014 FINAL  Final  Culture, blood (routine x 2)     Status: None   Collection Time: 03/20/14  5:30 PM  Result Value Ref Range Status   Specimen Description BLOOD RIGHT  HAND  Final   Special Requests BOTTLES DRAWN AEROBIC ONLY 8CC  Final   Culture  Setup Time   Final    03/21/2014 01:05 Performed at [facility]    Culture   Final    NO GROWTH 5 DAYS Performed at [facility]    Report Status 03/27/2014 FINAL  Final  Culture, blood (routine x 2)     Status: None   Collection Time: 03/20/14  5:37 PM  Result Value Ref Range Status   Specimen Description BLOOD LEFT  ARM  Final   Special Requests   Final    BOTTLES DRAWN AEROBIC ONLY BLUE BOTTLE 9 CC, RED BOTTLE 1 CC   Culture  Setup Time   Final    03/21/2014 01:06 Performed at [facility]    Culture   Final    NO GROWTH 5 DAYS Performed at [facility]    Report Status 03/27/2014 FINAL  Final  Body fluid culture     Status: None (Preliminary result)   Collection Time: 03/25/14  6:37 PM  Result Value Ref Range Status   Specimen Description SYNOVIAL RIGHT KNEE  Final   Special Requests NONE  Final   Gram Stain   Final    FEW WBC PRESENT,BOTH PMN AND MONONUCLEAR NO ORGANISMS SEEN Performed at [facility]    Culture   Final    NO GROWTH 2 DAYS Performed at [facility]    Report Status PENDING  Incomplete  Body fluid culture     Status: None (Preliminary result)   Collection Time: 03/25/14  6:46 PM  Result Value Ref Range Status   Specimen Description SYNOVIAL LEFT KNEE  Final   Special Requests NONE  Final   Gram Stain   Final    ABUNDANT WBC PRESENT,BOTH PMN AND MONONUCLEAR NO ORGANISMS SEEN Performed at [facility]    Culture   Final    NO GROWTH 2 DAYS Performed at [facility]    Report Status PENDING  Incomplete         Studies: Ct Abdomen Pelvis Wo Contrast  03/28/2014   CLINICAL DATA:  patient with history of hypertension, obesity, DM, chronic diastolic CHF, possible rheumatoid arthritis, recent hospitalization for MSSA bacteremia of unknown source/neg TEE- cleared after Vancomycin (PCN allergy), went to SNF and then discharged from there to home, presented with complaints of not eating or drinking 4 days and husband was concerned that patient was trying to hurt herself and she was IVC and brought to ED where she was assessed as sepsis of unclear source and admitted to stepdown for further management. Patient went into septic shock requiring pressors.  Patient with renal failure.  No contrast utilized.  EXAM: CT ABDOMEN  AND PELVIS WITHOUT CONTRAST  TECHNIQUE: Multidetector CT imaging of the abdomen and pelvis was performed following the standard protocol without IV contrast.  COMPARISON:  None.  FINDINGS: Small bilateral pleural effusions, larger on the left. There is dependent lower lobe opacity, also greater on the left, most likely atelectasis. Left lower lobe pneumonia should be considered given history. There is no pulmonary edema. Heart is normal in size.  There is small amount of low-density material that lies adjacent to the distal esophagus in the lower mediastinum just above the esophageal hiatus. This measures 3.2 cm x 2.7 cm x 2.8 cm. Is likely a focal collection of fluid. It could reflect an enlarged lymph node.  Unremarkable liver.  Gallbladder is  distended. A single gallstone lies in the dependent aspect of the gallbladder. There is no gallbladder wall thickening or convincing pericholecystic inflammation. No bile duct dilation or stone.  Abnormal spleen. Spleen is enlarged measuring 17.7 cm x 6.7 cm x 14.5 cm. There are irregular low-density areas throughout the spleen. These may reflect areas of infarction are nonspecific on this unenhanced study.  Pancreas is partly fatty replaced but otherwise unremarkable. No adrenal masses.  Ill-defined low-density lesion arises from the posterior midpole of the left kidney measuring 2.6 cm. This is likely a cyst. No other renal masses, no stones and no hydronephrosis. Normal ureters. Bladder is decompressed by a Foley catheter.  Uterus is unremarkable.  No adnexal masses.  No pathologically enlarged lymph nodes.  Small to moderate amount of ascites.  No evidence of an abscess.  No colon or small bowel wall thickening or dilation. No bowel inflammatory change. Normal appendix visualized.  Mild atherosclerotic calcifications noted along a normal caliber abdominal aorta and its branch vessels.  There is diffuse subcutaneous soft tissue edema.  Marked bilateral concentric hip  joint space narrowing. Mild degenerative changes noted of the visualized spine. Bones are demineralized. No osteoblastic or osteolytic lesions.  IMPRESSION: 1. Small, left greater right, bilateral pleural effusions, small to moderate ascites and diffuse subcutaneous edema. Findings reflect anasarca of unclear etiology. 2. Left lower lobe lung base opacity, which may all be atelectasis. Consider pneumonia given the provided history. 3. Abnormal spleen. Spleen is enlarged to a maximum of 17.7 cm. It shows multiple hypo attenuating areas that may reflect infarcts but are nonspecific. Cause of the splenomegaly is unclear. Possible etiologies include infection, hemolytic anemias, neoplastic disease is including leukemia and lymphoma and areas metabolic disorders. Increased venous drainage pressure such as from splenic vein thrombosis should also be considered. 4. Other chronic findings as detailed. No other evidence of a source of infection.   Electronically Signed   By: Lajean Manes M.D.   On: 03/28/2014 11:46   Dg Abd 1 View  03/27/2014   CLINICAL DATA:  Severe nausea and vomiting.  EXAM: ABDOMEN - 1 VIEW  COMPARISON:  03/25/2014  FINDINGS: Gaseous distension of the large bowel loops noted. A large desiccated stool ball is noted within the rectum. The patient's feeding tube is no longer visualized.  IMPRESSION: 1. Feeding tube is no longer visualized and may have been pulled back into the esophagus. 2. No change in gaseous distension of large bowel loops with desiccated stool in the rectum.   Electronically Signed   By: Kerby Moors M.D.   On: 03/27/2014 15:58   US Pelvis Complete  03/28/2014   CLINICAL DATA:  Vaginal bleeding. Patient did not allow endovaginal imaging.  EXAM: TRANSABDOMINAL ULTRASOUND OF PELVIS  TECHNIQUE: Transabdominal ultrasound examination of the pelvis was performed including evaluation of the uterus, ovaries, adnexal regions, and pelvic cul-de-sac.  COMPARISON:  None.  FINDINGS:  Uterus  Measurements: 8.7 cm x 3.8 cm x 5.5 cm. No uterine mass. Cervix is unremarkable.  Endometrium  Thickness: 14 mm.  No focal endometrial mass.  No endometrial fluid.  Right ovary  Not visualized.  No adnexal masses.  Left ovary  Not visualized.  No adnexal masses.  Other findings: The small to moderate amount of ascites is seen adjacent to the uterus and on the right lower and left lower quadrants.  IMPRESSION: 1. Thickened endometrium for this patient's age. In the setting of post-menopausal bleeding, endometrial sampling is indicated to exclude carcinoma. If results  are benign, sonohysterogram should be considered for focal lesion work-up. (Ref: Radiological Reasoning: Algorithmic Workup of Abnormal Vaginal Bleeding with Endovaginal Sonography and Sonohysterography. AJR 2008; 657:Q46-96) 2. No uterine masses. Neither ovary visualized. Small to moderate amount of ascites.   Electronically Signed   By: Lajean Manes M.D.   On: 03/28/2014 11:31        Scheduled Meds: . albumin human  12.5 g Intravenous TID  . antiseptic oral rinse  7 mL Mouth Rinse q12n4p  . atorvastatin  20 mg Oral q1800  . bisacodyl  10 mg Rectal Once  .  ceFAZolin (ANCEF) IV  2 g Intravenous Q8H  . chlorhexidine  15 mL Mouth Rinse BID  . feeding supplement (ENSURE COMPLETE)  237 mL Oral TID BM  . feeding supplement (GLUCERNA 1.2 CAL)  1,000 mL Per Tube Q24H  . fludrocortisone  0.1 mg Oral Daily  . hydrocortisone cream   Topical BID  . insulin aspart  0-5 Units Subcutaneous QHS  . insulin aspart  0-9 Units Subcutaneous TID WC  . insulin glargine  12 Units Subcutaneous Daily  . methylPREDNISolone (SOLU-MEDROL) injection  40 mg Intravenous Daily  . metoCLOPramide (REGLAN) injection  5 mg Intravenous 3 times per day  . mirtazapine  15 mg Oral QHS  . OLANZapine zydis  5 mg Oral QHS  . pantoprazole (PROTONIX) IV  40 mg Intravenous Q12H  . sodium bicarbonate  650 mg Oral TID  . sodium chloride  10-40 mL Intracatheter Q12H    Continuous Infusions: . 0.9 % NaCl with KCl 20 mEq / L 10 mL/hr at 03/28/14 2145  . phenylephrine (NEO-SYNEPHRINE) Adult infusion Stopped (03/23/14 0800)    Active Problems:   DM2 (diabetes mellitus, type 2)   Physical deconditioning   Hypoalbuminemia   Hyponatremia   Severe sepsis   Acute UTI   Protein-calorie malnutrition, severe   Sacral decubitus ulcer   Acute pulmonary edema   Effusion into joint   ST elevation myocardial infarction (STEMI) of inferolateral wall, initial episode of care   Severe sepsis with septic shock   Diabetic ulcer of left foot associated with diabetes mellitus due to underlying condition   Osteomyelitis   Ulcer of heel   Acute osteomyelitis of femur   Septic arthritis of knee, left   STEMI (ST elevation myocardial infarction)   Ulcer of left heel   Metabolic acidosis   Bacteremia   Knee pain   Ileus   Nausea with vomiting   Palliative care encounter   Appetite loss    Time spent: 40 minutes.    Reyne Dumas, MD [facility] Pager [phone]  If 7PM-7AM, please contact night-coverage [email or web address] Password [email or web address] 03/29/2014, 12:19 PM    LOS: 10 days

## 2014-03-29 NOTE — Progress Notes (Signed)
PARENTERAL NUTRITION CONSULT NOTE - INITIAL  Pharmacy Consult for TPN Indication: intolerance to enteral feeding  Allergies  Allergen Reactions  . Penicillins Rash    Patient Measurements: Height: 5' 4"  (162.6 cm) Weight: 199 lb 1.2 oz (90.3 kg) IBW/kg (Calculated) : 54.7  Vital Signs: Temp: 98.9 F (37.2 C) (12/13 0400) Temp Source: Oral (12/13 0400) BP: 138/51 mmHg (12/13 0800) Pulse Rate: 87 (12/13 0800) Intake/Output from previous day: 12/12 0701 - 12/13 0700 In: 580 [I.V.:230; Blood:50; IV Piggyback:300] Out: 985 [Urine:985] Intake/Output from this shift: Total I/O In: 70 [I.V.:20; IV Piggyback:50] Out: 110 [Urine:110]  Labs:  Recent Labs  03/27/14 1700 03/28/14 0430 03/28/14 1445 03/29/14 0420  WBC 5.2 3.3*  --  3.8*  HGB 8.5* 7.0*  --  8.9*  HCT 26.3* 22.1*  --  27.3*  PLT 113* 79*  --  75*  APTT  --   --  36  --   INR  --   --  1.70*  --      Recent Labs  03/27/14 0450 03/27/14 1721 03/28/14 0430  NA 141  --  143  K 4.1  --  5.2  CL 108  --  111  CO2 20  --  19  GLUCOSE 74  --  64*  BUN 55*  --  59*  CREATININE 1.27*  --  1.33*  LABCREA  --  43.45  --   CALCIUM 7.4*  --  7.1*  PROT  --   --  4.8*  ALBUMIN  --   --  1.6*  AST  --   --  46*  ALT  --   --  <5  ALKPHOS  --   --  392*  BILITOT  --   --  1.1   Estimated Creatinine Clearance: 48.9 mL/min (by C-G formula based on Cr of 1.33).    Recent Labs  03/28/14 0751 03/28/14 1122 03/28/14 1706  GLUCAP 105* 78 91    Medical History: Past Medical History  Diagnosis Date  . Hypertension   . Diabetes mellitus without complication   . Arthritis   . Coronary artery disease     pt unaware    Medications:  Scheduled:  . albumin human  12.5 g Intravenous TID  . antiseptic oral rinse  7 mL Mouth Rinse q12n4p  . atorvastatin  20 mg Oral q1800  . bisacodyl  10 mg Rectal Once  .  ceFAZolin (ANCEF) IV  2 g Intravenous Q8H  . chlorhexidine  15 mL Mouth Rinse BID  . feeding  supplement (ENSURE COMPLETE)  237 mL Oral TID BM  . feeding supplement (GLUCERNA 1.2 CAL)  1,000 mL Per Tube Q24H  . fludrocortisone  0.1 mg Oral Daily  . hydrocortisone cream   Topical BID  . insulin aspart  0-5 Units Subcutaneous QHS  . insulin aspart  0-9 Units Subcutaneous TID WC  . insulin glargine  12 Units Subcutaneous Daily  . methylPREDNISolone (SOLU-MEDROL) injection  40 mg Intravenous Daily  . mirtazapine  15 mg Oral QHS  . OLANZapine zydis  5 mg Oral QHS  . pantoprazole (PROTONIX) IV  40 mg Intravenous Q12H  . sodium bicarbonate  650 mg Oral TID  . sodium chloride  10-40 mL Intracatheter Q12H   Infusions:  . 0.9 % NaCl with KCl 20 mEq / L 10 mL/hr at 03/28/14 2145  . phenylephrine (NEO-SYNEPHRINE) Adult infusion Stopped (03/23/14 0800)   PRN: acetaminophen **OR** acetaminophen, chlorpheniramine-HYDROcodone, HYDROmorphone (DILAUDID) injection, ondansetron **  OR** ondansetron (ZOFRAN) IV, sodium chloride   Insulin Requirements:   Lantus 12 units daily (started 12/12, previously 15 units daily 12/7-12/11)  Sensitive SSI: none used in past 24 hours (last used 12/9)  Current Nutrition:   NONE  Glucerna 1.2 TF @ 20-65 ml/hr (started 12/9, has not been able to advance, currently held)  Enusure supplement TID (pt has refused all doses)  IVF: NS+KCl 20 meq/L @ 10 ml/hr  Central access: PICC line placed 12/8 TPN start date: 12/13  ASSESSMENT                                                                                                          67 yoF admitted on 12/3 with fever, confusion, refusing care/PO intake, septic shock with recurrent MSSA bacteremia.  PICC line placed 12/8 for prolonged IV antibiotics.  She has severe deconditioning and severe protein calorie malnutrition; she has failed to advance tube feeds to goal and continues to complain of N/V with enteral intake.  MD to discuss PEG tube placement for long-term management, but may not be available until next  week.  She has been started on steroids, olanzapine, mirtazapine, and prn anti-emetics to help with nausea and anorexia.  Pharmacy is consulted to dose TPN.  She is high risk for refeeding.  Significant events:  12/8 Pt refusing PO.  Pharmacy consulted for TPN, but NOT started d/t contraindication w/ functioning gut.  Pt and husband agree to try enteral feeding.   12/10 Glucerna tolerated at 20 ml/hr but had N/V when advanced to 30 ml/hr. 12/11 TF advanced to 40 ml/hr overnight, but pt had large volume emesis.  TF held, pt refusing all PO intake.  Today, 03/29/2014:  Glucose - CBGs have been low/normal.  Most are < 100 on Solumedrol, Lantus, and no SSI.  Electrolytes - Phos 4.7 slightly elevated, K low, Mag WNL, CorrCa 9.08.    NaBicarb tabs ordered TID, refusing most doses.   Renal - SCr 1.55, elevated and increasing  LFTs - AST/ALT WNL, Tbili elevated, alk phos elevated  TGs - pending   Prealbumin: <3 (12/5), repeat pending  NUTRITIONAL GOALS                                                                                              RD recs (12/11): 1800-2000 kCal, 100-110 grams of protein per day  Clinimix 5/15 at a goal rate of 83 ml/hr + 20% fat emulsion at 10 ml/hr to provide: 100 g/day protein, 1894 Kcal/day.  PLAN  At 1800 today:  Start Clinimix E 5/15 at 40 ml/hr.  Watch electrolytes closely; may need to remove electrolytes from TPN if Phos remains elevated/increased.  20% fat emulsion at 10 ml/hr.  Plan to advance as tolerated to the goal rate.  TPN to contain standard multivitamins and trace elements.  Continue IVF at 10 ml/hr.  Continue Lantus 12 units daily and sensitive SSI q4h.  TPN lab panels on Mondays & Thursdays.  F/u daily, watch for refeeding.   Gretta Arab PharmD, BCPS Pager [phone] 03/29/2014 10:03 AM

## 2014-03-29 NOTE — Progress Notes (Signed)
Assessment:  1 Nonoliguric AKI, hemodynamically mediated v. immune complex mediated from infx --improved UOP 2 MSSA bacteremia 3 UTI, E. Coli 4 Anasarca/hypoalbuminemia 5 Proteinuria of uncertain significance 6 Anemia, S/P prbcS  Plan: continue colloid support to improve hemodynamics; renal fct labs pending for today  Subjective: Interval History: UOP has improved  Objective: Vital signs in last 24 hours: Temp:  [98.4 F (36.9 C)-98.9 F (37.2 C)] 98.9 F (37.2 C) (12/13 0400) Pulse Rate:  [74-120] 87 (12/13 0800) Resp:  [15-24] 18 (12/13 0800) BP: (113-140)/(45-68) 138/51 mmHg (12/13 0800) SpO2:  [92 %-98 %] 94 % (12/13 0800) Weight:  [90.3 kg (199 lb 1.2 oz)] 90.3 kg (199 lb 1.2 oz) (12/13 0400) Weight change: -2.8 kg (-6 lb 2.8 oz)  Intake/Output from previous day: 12/12 0701 - 12/13 0700 In: 580 [I.V.:230; Blood:50; IV Piggyback:300] Out: 985 [Urine:985] Intake/Output this shift: Total I/O In: 70 [I.V.:20; IV Piggyback:50] Out: 110 [Urine:110]  General appearance: alert, cooperative and ill appearing Resp: clear to auscultation bilaterally anteriorly Cardio: regular rate and rhythm, S1, S2 normal, no murmur, click, rub or gallop Extremities: anasarca  Lab Results:  Recent Labs  03/28/14 0430 03/29/14 0420  WBC 3.3* 3.8*  HGB 7.0* 8.9*  HCT 22.1* 27.3*  PLT 79* 75*   BMET:  Recent Labs  03/27/14 0450 03/28/14 0430  NA 141 143  K 4.1 5.2  CL 108 111  CO2 20 19  GLUCOSE 74 64*  BUN 55* 59*  CREATININE 1.27* 1.33*  CALCIUM 7.4* 7.1*   No results for input(s): PTH in the last 72 hours. Iron Studies:  Recent Labs  03/28/14 0825  IRON 31*  TIBC 91*  FERRITIN 1545*   Studies/Results: Ct Abdomen Pelvis Wo Contrast  03/28/2014   CLINICAL DATA:  patient with history of hypertension, obesity, DM, chronic diastolic CHF, possible rheumatoid arthritis, recent hospitalization for MSSA bacteremia of unknown source/neg TEE- cleared after Vancomycin (PCN  allergy), went to SNF and then discharged from there to home, presented with complaints of not eating or drinking 4 days and husband was concerned that patient was trying to hurt herself and she was IVC and brought to ED where she was assessed as sepsis of unclear source and admitted to stepdown for further management. Patient went into septic shock requiring pressors.  Patient with renal failure.  No contrast utilized.  EXAM: CT ABDOMEN AND PELVIS WITHOUT CONTRAST  TECHNIQUE: Multidetector CT imaging of the abdomen and pelvis was performed following the standard protocol without IV contrast.  COMPARISON:  None.  FINDINGS: Small bilateral pleural effusions, larger on the left. There is dependent lower lobe opacity, also greater on the left, most likely atelectasis. Left lower lobe pneumonia should be considered given history. There is no pulmonary edema. Heart is normal in size.  There is small amount of low-density material that lies adjacent to the distal esophagus in the lower mediastinum just above the esophageal hiatus. This measures 3.2 cm x 2.7 cm x 2.8 cm. Is likely a focal collection of fluid. It could reflect an enlarged lymph node.  Unremarkable liver.  Gallbladder is distended. A single gallstone lies in the dependent aspect of the gallbladder. There is no gallbladder wall thickening or convincing pericholecystic inflammation. No bile duct dilation or stone.  Abnormal spleen. Spleen is enlarged measuring 17.7 cm x 6.7 cm x 14.5 cm. There are irregular low-density areas throughout the spleen. These may reflect areas of infarction are nonspecific on this unenhanced study.  Pancreas is partly fatty  replaced but otherwise unremarkable. No adrenal masses.  Ill-defined low-density lesion arises from the posterior midpole of the left kidney measuring 2.6 cm. This is likely a cyst. No other renal masses, no stones and no hydronephrosis. Normal ureters. Bladder is decompressed by a Foley catheter.  Uterus is  unremarkable.  No adnexal masses.  No pathologically enlarged lymph nodes.  Small to moderate amount of ascites.  No evidence of an abscess.  No colon or small bowel wall thickening or dilation. No bowel inflammatory change. Normal appendix visualized.  Mild atherosclerotic calcifications noted along a normal caliber abdominal aorta and its branch vessels.  There is diffuse subcutaneous soft tissue edema.  Marked bilateral concentric hip joint space narrowing. Mild degenerative changes noted of the visualized spine. Bones are demineralized. No osteoblastic or osteolytic lesions.  IMPRESSION: 1. Small, left greater right, bilateral pleural effusions, small to moderate ascites and diffuse subcutaneous edema. Findings reflect anasarca of unclear etiology. 2. Left lower lobe lung base opacity, which may all be atelectasis. Consider pneumonia given the provided history. 3. Abnormal spleen. Spleen is enlarged to a maximum of 17.7 cm. It shows multiple hypo attenuating areas that may reflect infarcts but are nonspecific. Cause of the splenomegaly is unclear. Possible etiologies include infection, hemolytic anemias, neoplastic disease is including leukemia and lymphoma and areas metabolic disorders. Increased venous drainage pressure such as from splenic vein thrombosis should also be considered. 4. Other chronic findings as detailed. No other evidence of a source of infection.   Electronically Signed   By: Amie Portland M.D.   On: 03/28/2014 11:46   Dg Abd 1 View  03/27/2014   CLINICAL DATA:  Severe nausea and vomiting.  EXAM: ABDOMEN - 1 VIEW  COMPARISON:  03/25/2014  FINDINGS: Gaseous distension of the large bowel loops noted. A large desiccated stool ball is noted within the rectum. The patient's feeding tube is no longer visualized.  IMPRESSION: 1. Feeding tube is no longer visualized and may have been pulled back into the esophagus. 2. No change in gaseous distension of large bowel loops with desiccated stool in  the rectum.   Electronically Signed   By: Signa Kell M.D.   On: 03/27/2014 15:58   US Pelvis Complete  03/28/2014   CLINICAL DATA:  Vaginal bleeding. Patient did not allow endovaginal imaging.  EXAM: TRANSABDOMINAL ULTRASOUND OF PELVIS  TECHNIQUE: Transabdominal ultrasound examination of the pelvis was performed including evaluation of the uterus, ovaries, adnexal regions, and pelvic cul-de-sac.  COMPARISON:  None.  FINDINGS: Uterus  Measurements: 8.7 cm x 3.8 cm x 5.5 cm. No uterine mass. Cervix is unremarkable.  Endometrium  Thickness: 14 mm.  No focal endometrial mass.  No endometrial fluid.  Right ovary  Not visualized.  No adnexal masses.  Left ovary  Not visualized.  No adnexal masses.  Other findings: The small to moderate amount of ascites is seen adjacent to the uterus and on the right lower and left lower quadrants.  IMPRESSION: 1. Thickened endometrium for this patient's age. In the setting of post-menopausal bleeding, endometrial sampling is indicated to exclude carcinoma. If results are benign, sonohysterogram should be considered for focal lesion work-up. (Ref: Radiological Reasoning: Algorithmic Workup of Abnormal Vaginal Bleeding with Endovaginal Sonography and Sonohysterography. AJR 2008; 818:H63-14) 2. No uterine masses. Neither ovary visualized. Small to moderate amount of ascites.   Electronically Signed   By: Amie Portland M.D.   On: 03/28/2014 11:31   Scheduled: . albumin human  12.5 g Intravenous TID  .  antiseptic oral rinse  7 mL Mouth Rinse q12n4p  . atorvastatin  20 mg Oral q1800  . bisacodyl  10 mg Rectal Once  .  ceFAZolin (ANCEF) IV  2 g Intravenous Q8H  . chlorhexidine  15 mL Mouth Rinse BID  . feeding supplement (ENSURE COMPLETE)  237 mL Oral TID BM  . feeding supplement (GLUCERNA 1.2 CAL)  1,000 mL Per Tube Q24H  . fludrocortisone  0.1 mg Oral Daily  . hydrocortisone cream   Topical BID  . insulin aspart  0-5 Units Subcutaneous QHS  . insulin aspart  0-9 Units  Subcutaneous TID WC  . insulin glargine  12 Units Subcutaneous Daily  . methylPREDNISolone (SOLU-MEDROL) injection  40 mg Intravenous Daily  . mirtazapine  15 mg Oral QHS  . OLANZapine zydis  5 mg Oral QHS  . pantoprazole (PROTONIX) IV  40 mg Intravenous Q12H  . sodium bicarbonate  650 mg Oral TID  . sodium chloride  10-40 mL Intracatheter Q12H     LOS: 10 days   Gabryel Files C 03/29/2014,9:12 AM

## 2014-03-29 NOTE — Progress Notes (Signed)
Patient UR:[facility] Robin Schroeder      DOB: 08/06/1955      BJS:[id number]   Palliative Medicine Team at [facility] Progress Note    Subjective: [name] still will not engage me much.  Even when I attempt garnering assistance from her husband, she will only give me short bursts of one word answers. Noted nausea/vomiting overnight. No other complaints I can elicit.  She will not engage me in discussion around illness understanding.      Filed Vitals:   03/29/14 0800  BP: 138/51  Pulse: 87  Temp:   Resp: 18   Physical exam: General: Drowsy but can open eyes and respond briefly to questions. Does not make efforts to engage me in conversation Chest: CTAB CVS:RRR Abdomen: obese, NT Ext: anasarca Psych: withdrawn      CBC    Component Value Date/Time   WBC 3.8* 03/29/2014 0420   WBC 5.1 05/09/2013 1233   RBC 3.11* 03/29/2014 0420   RBC 2.42* 03/28/2014 0825   RBC 4.84 05/09/2013 1233   HGB 8.9* 03/29/2014 0420   HGB 14.4 05/09/2013 1233   HCT 27.3* 03/29/2014 0420   HCT 47.4 05/09/2013 1233   PLT 75* 03/29/2014 0420   MCV 87.8 03/29/2014 0420   MCV 97.9* 05/09/2013 1233   MCH 28.6 03/29/2014 0420   MCH 29.8 05/09/2013 1233   MCHC 32.6 03/29/2014 0420   MCHC 30.4* 05/09/2013 1233   RDW 18.6* 03/29/2014 0420   LYMPHSABS 0.7 03/21/2014 0357   MONOABS 0.3 03/21/2014 0357   EOSABS 0.0 03/21/2014 0357   BASOSABS 0.0 03/21/2014 0357    CMP     Component Value Date/Time   NA 143 03/28/2014 0430   K 5.2 03/28/2014 0430   CL 111 03/28/2014 0430   CO2 19 03/28/2014 0430   GLUCOSE 64* 03/28/2014 0430   BUN 59* 03/28/2014 0430   CREATININE 1.33* 03/28/2014 0430   CREATININE 0.44* 02/12/2014 1232   CALCIUM 7.1* 03/28/2014 0430   PROT 4.8* 03/28/2014 0430   ALBUMIN 1.6* 03/28/2014 0430   AST 46* 03/28/2014 0430   ALT <5 03/28/2014 0430   ALKPHOS 392* 03/28/2014 0430   BILITOT 1.1 03/28/2014 0430   GFRNONAA 42* 03/28/2014 0430   GFRNONAA >89 02/12/2014 1232   GFRAA  49* 03/28/2014 0430   GFRAA >89 02/12/2014 1232     Assessment and plan: 58 yo female with multiple medical problems as below who presented with fevers, decreased PO intake. Found to have sepsis, STEMI, splenomegaly in hospital.   1. Code Status: Partial. No CPR/Intubation  2. Goals of Care:  See previous notes. I had extensive discussion with husband today.  He outlined her gradual physical decline since 2013 which was precipitated by fall at work.  Functional decline accelerated about 1 year ago when she had more difficulty with walking. Husband attempted multiple times to persuade her to see physicians and she generally refused. When mobility and joint pain became increasingly debilitating she did eventually agree to an urgent care visit (January per husband). She was told she needed to loose weight and take better care of herself, which husband believes made her feel bad and withdraw even more from medical care.  Her health continued to decline until her hospitalization in October.  She has been functionally bed bound since then.  She was at SNF/rehab for a month and made some progress in her strength, but not much. When she returned home, she would only lay in bed, and be resistant to  care even from her husband.  At one point in this course, her husband reports trying to get her IVC just so she would receive medical care which she refused.    Marlatt expresses what seems to be genuine concern and care for Robin Schroeder.  This clearly has been a struggle emotionally/physically/mentally for him.  He remains very fearful of losing his wife. He has had conversations with multiple physicians both on this admission and last about her apparent poor prognosis.  He worries about her suffering and lack of improvement over these past several months after finally Vessie engaged with medical care.  Comfort care has been mentioned as an option to him in the past.  If she is able to improve some from here, he validly  worries about how she will do in long run as she often refuses to participate in care. He wonders if we are getting close to point where we should pursue comfort care (though not made up in his mind).  I have tried to engage [name] about this but she will not give me more than one word responses.  Ultimately Warren and I talked about making decisions about general goals of care around how she reponds (or does not) to treatment. I think if in the coming days are medical interventions are not leading to improvement, that is further worsening her chances of any meaningul recovery.  Psych to see again. i am unable to engage [name] to determine if she still has capacity.  If psych feels she still has capacity, decision for comfort care will need to be made by her.  I am not sure how well she would do with PEG as she seemingly did not tolerate tube feeds well with NG.  I would be hesitant to place PEG without her tolerating this first.    3. Symptom Management:  1. Nausea- difficulty tolerating tube feeds. Makes me worry that she will not do well with PEG. On multiple drugs for nausea. I will try pro-kinetic reglan to see if any improvement.  May make TF more tolerable.   2. Appetite Loss/FTT- She is already on steroids, zyprexa, remeron. Doubt addition of other agents would make significant impact.  4. Psychosocial/Spiritual: Family requested chaplain support. I have placed consult. Few sisters in [place]. 1 sister and a brother in [place].  Married to [name] for >15 years.    Total Time: 90 minutes Time In: 745 Time Out: 915  Greater than 50% of this time was spent counseling and coordinating care related to the above assessment and plan, extensive review of records.  Orvis Brill D.O. Palliative Medicine Team at [facility]  Pager: [phone] Team Phone: [phone]

## 2014-03-30 DIAGNOSIS — D62 Acute posthemorrhagic anemia: Secondary | ICD-10-CM

## 2014-03-30 DIAGNOSIS — N939 Abnormal uterine and vaginal bleeding, unspecified: Secondary | ICD-10-CM

## 2014-03-30 DIAGNOSIS — D6959 Other secondary thrombocytopenia: Secondary | ICD-10-CM

## 2014-03-30 DIAGNOSIS — R58 Hemorrhage, not elsewhere classified: Secondary | ICD-10-CM

## 2014-03-30 LAB — PREALBUMIN: PREALBUMIN: 11.5 mg/dL — AB (ref 17.0–34.0)

## 2014-03-30 LAB — COMPREHENSIVE METABOLIC PANEL
ALBUMIN: 2.4 g/dL — AB (ref 3.5–5.2)
ALK PHOS: 245 U/L — AB (ref 39–117)
ALT: <5 U/L (ref 0–35)
AST: 25 U/L (ref 0–37)
Anion gap: 14 (ref 5–15)
BUN: 74 mg/dL — ABNORMAL HIGH (ref 6–23)
CO2: 19 mEq/L (ref 19–32)
Calcium: 8 mg/dL — ABNORMAL LOW (ref 8.4–10.5)
Chloride: 113 mEq/L — ABNORMAL HIGH (ref 96–112)
Creatinine, Ser: 1.63 mg/dL — ABNORMAL HIGH (ref 0.50–1.10)
GFR calc Af Amer: 39 mL/min — ABNORMAL LOW (ref >90)
GFR calc non Af Amer: 33 mL/min — ABNORMAL LOW (ref >90)
Glucose, Bld: 167 mg/dL — ABNORMAL HIGH (ref 70–99)
POTASSIUM: 3.5 meq/L — AB (ref 3.7–5.3)
SODIUM: 146 meq/L (ref 137–147)
Total Bilirubin: 0.9 mg/dL (ref 0.3–1.2)
Total Protein: 5.1 g/dL — ABNORMAL LOW (ref 6.0–8.3)

## 2014-03-30 LAB — DIFFERENTIAL
BASOS ABS: 0 10*3/uL (ref 0.0–0.1)
Basophils Relative: 0 % (ref 0–1)
Eosinophils Absolute: 0 10*3/uL (ref 0.0–0.7)
Eosinophils Relative: 0 % (ref 0–5)
LYMPHS PCT: 9 % — AB (ref 12–46)
Lymphs Abs: 0.3 10*3/uL — ABNORMAL LOW (ref 0.7–4.0)
MONO ABS: 0.2 10*3/uL (ref 0.1–1.0)
MONOS PCT: 6 % (ref 3–12)
NEUTROS ABS: 2.9 10*3/uL (ref 1.7–7.7)
Neutrophils Relative %: 86 % — ABNORMAL HIGH (ref 43–77)

## 2014-03-30 LAB — GLUCOSE, CAPILLARY
GLUCOSE-CAPILLARY: 176 mg/dL — AB (ref 70–99)
GLUCOSE-CAPILLARY: 137 mg/dL — AB (ref 70–99)
Glucose-Capillary: 168 mg/dL — ABNORMAL HIGH (ref 70–99)
Glucose-Capillary: 160 mg/dL — ABNORMAL HIGH (ref 70–99)

## 2014-03-30 LAB — CBC
HEMATOCRIT: 25.2 % — AB (ref 36.0–46.0)
HEMOGLOBIN: 8.2 g/dL — AB (ref 12.0–15.0)
MCH: 29 pg (ref 26.0–34.0)
MCHC: 32.5 g/dL (ref 30.0–36.0)
MCV: 89 fL (ref 78.0–100.0)
PLATELETS: 63 10*3/uL — AB (ref 150–400)
RBC: 2.83 MIL/uL — AB (ref 3.87–5.11)
RDW: 18.9 % — ABNORMAL HIGH (ref 11.5–15.5)
WBC: 3.4 10*3/uL — ABNORMAL LOW (ref 4.0–10.5)

## 2014-03-30 LAB — MAGNESIUM: Magnesium: 2.3 mg/dL (ref 1.5–2.5)

## 2014-03-30 LAB — PHOSPHORUS: Phosphorus: 5 mg/dL — ABNORMAL HIGH (ref 2.3–4.6)

## 2014-03-30 LAB — TRIGLYCERIDES: Triglycerides: 65 mg/dL (ref <150)

## 2014-03-30 MED ORDER — SODIUM CHLORIDE 0.9 % IV SOLN
INTRAVENOUS | Status: DC
  Administered 2014-03-30: 11:00:00 via INTRAVENOUS

## 2014-03-30 MED ORDER — POTASSIUM CHLORIDE 10 MEQ/50ML IV SOLN
10.0000 meq | INTRAVENOUS | Status: AC
  Administered 2014-03-30 (×2): 10 meq via INTRAVENOUS
  Filled 2014-03-30 (×2): qty 50

## 2014-03-30 MED ORDER — INSULIN ASPART 100 UNIT/ML ~~LOC~~ SOLN
0.0000 [IU] | SUBCUTANEOUS | Status: DC
  Administered 2014-03-30: 2 [IU] via SUBCUTANEOUS
  Administered 2014-03-30: 3 [IU] via SUBCUTANEOUS
  Administered 2014-03-31: 11 [IU] via SUBCUTANEOUS
  Administered 2014-03-31: 3 [IU] via SUBCUTANEOUS
  Administered 2014-03-31: 8 [IU] via SUBCUTANEOUS
  Administered 2014-03-31: 2 [IU] via SUBCUTANEOUS
  Administered 2014-04-01: 5 [IU] via SUBCUTANEOUS
  Administered 2014-04-01: 3 [IU] via SUBCUTANEOUS
  Administered 2014-04-01 (×2): 5 [IU] via SUBCUTANEOUS
  Administered 2014-04-01 (×2): 3 [IU] via SUBCUTANEOUS
  Administered 2014-04-02: 5 [IU] via SUBCUTANEOUS
  Administered 2014-04-02 (×2): 3 [IU] via SUBCUTANEOUS

## 2014-03-30 MED ORDER — CLINIMIX/DEXTROSE (5/15) 5 % IV SOLN
INTRAVENOUS | Status: AC
  Administered 2014-03-30: 19:00:00 via INTRAVENOUS
  Filled 2014-03-30: qty 2000

## 2014-03-30 MED ORDER — FAT EMULSION 20 % IV EMUL
250.0000 mL | INTRAVENOUS | Status: AC
  Administered 2014-03-30 (×2): 250 mL via INTRAVENOUS
  Filled 2014-03-30: qty 250

## 2014-03-30 MED ORDER — VITAMIN K1 10 MG/ML IJ SOLN
10.0000 mg | Freq: Every morning | INTRAVENOUS | Status: DC
  Administered 2014-03-30 – 2014-03-31 (×2): 10 mg via INTRAVENOUS
  Filled 2014-03-30 (×3): qty 1

## 2014-03-30 NOTE — Progress Notes (Signed)
PARENTERAL NUTRITION CONSULT NOTE - INITIAL  Pharmacy Consult for TPN Indication: intolerance to enteral feeding  Allergies  Allergen Reactions  . Penicillins Rash    Patient Measurements: Height: _0  (162.6 cm) Weight: 201 lb 15.1 oz (91.6 kg) IBW/kg (Calculated) : 54.7  Vital Signs: Temp: 98 F (36.7 C) (12/14 0800) Temp Source: Oral (12/14 0800) BP: 125/50 mmHg (12/14 0800) Intake/Output from previous day: 12/13 0701 - 12/14 0700 In: 1189.8 [I.V.:240; IV Piggyback:300; TPN:649.8] Out: 756 [Urine:755; Stool:1] Intake/Output from this shift:    Labs:  Recent Labs  03/28/14 1445 03/29/14 0420 03/29/14 1120 03/30/14 0400  WBC  --  3.8* 3.4* 3.4*  HGB  --  8.9* 8.7* 8.2*  HCT  --  27.3* 27.2* 25.2*  PLT  --  75* 74* 63*  APTT 36  --   --   --   INR 1.70*  --   --   --      Recent Labs  03/27/14 1721 03/28/14 0430 03/29/14 1120 03/30/14 0400  NA  --  143 145 146  K  --  5.2 3.6* 3.5*  CL  --  111 112 113*  CO2  --  _1 GLUCOSE  --  64* 77 167*  BUN  --  59* 67* 74*  CREATININE  --  1.33* 1.55* 1.63*  LABCREA 43.45  --   --   --   CALCIUM  --  7.1* 7.8* 8.0*  MG  --   --  2.3 2.3  PHOS  --   --  4.7* 5.0*  PROT  --  4.8* 5.3* 5.1*  ALBUMIN  --  1.6* 2.4* 2.4*  AST  --  46* 35 25  ALT  --  <5 <5 <5  ALKPHOS  --  392* 309* 245*  BILITOT  --  1.1 1.3* 0.9  TRIG  --   --   --  65   Estimated Creatinine Clearance: 40.3 mL/min (by C-G formula based on Cr of 1.63).    Recent Labs  03/29/14 1604 03/30/14 0035 03/30/14 0548  GLUCAP 80 168* 176*    Medical History: Past Medical History  Diagnosis Date  . Hypertension   . Diabetes mellitus without complication   . Arthritis   . Coronary artery disease     pt unaware    Medications:  Scheduled:  . albumin human  12.5 g Intravenous TID  . antiseptic oral rinse  7 mL Mouth Rinse q12n4p  . atorvastatin  20 mg Oral q1800  . bisacodyl  10 mg Rectal Once  .  ceFAZolin (ANCEF) IV  2 g  Intravenous Q8H  . chlorhexidine  15 mL Mouth Rinse BID  . feeding supplement (ENSURE COMPLETE)  237 mL Oral TID BM  . feeding supplement (GLUCERNA 1.2 CAL)  1,000 mL Per Tube Q24H  . fentaNYL  12.5 mcg Transdermal Q72H  . fludrocortisone  0.1 mg Oral Daily  . hydrocortisone cream   Topical BID  . insulin aspart  0-9 Units Subcutaneous 4 times per day  . insulin glargine  12 Units Subcutaneous Daily  . methylPREDNISolone (SOLU-MEDROL) injection  40 mg Intravenous Daily  . metoCLOPramide (REGLAN) injection  5 mg Intravenous 3 times per day  . mirtazapine  15 mg Oral QHS  . OLANZapine zydis  5 mg Oral QHS  . pantoprazole (PROTONIX) IV  40 mg Intravenous Q12H  . sodium bicarbonate  650 mg Oral TID  . sodium chloride  10-40 mL  Intracatheter Q12H   Infusions:  . sodium chloride    . 0.9 % NaCl with KCl 20 mEq / L 10 mL/hr at 03/28/14 2145  . Marland KitchenTPN (CLINIMIX-E) Adult 40 mL/hr at 03/29/14 1702   And  . fat emulsion 250 mL (03/30/14 0300)  . phenylephrine (NEO-SYNEPHRINE) Adult infusion Stopped (03/23/14 0800)   PRN: acetaminophen **OR** acetaminophen, chlorpheniramine-HYDROcodone, HYDROmorphone (DILAUDID) injection, ondansetron **OR** ondansetron (ZOFRAN) IV, sodium chloride   Insulin Requirements:   Lantus 12 units daily (started 12/12, previously 15 units daily 12/7-12/11)  Sensitive SSI: 4 units since TPN initiation  Current Nutrition:  Ensure supplement TID, has been refusing, did receive one dose this AM  IVF:  NS @ 75 ml/hr  Central access: PICC line placed 12/8 TPN start date: 12/13  ASSESSMENT                                                                                                          62 yoF admitted on 12/3 with fever, confusion, refusing care/PO intake, septic shock with recurrent MSSA bacteremia.  PICC line placed 12/8 for prolonged IV antibiotics.  She has severe deconditioning and severe protein calorie malnutrition; she has failed to advance tube feeds to  goal and continues to complain of N/V with enteral intake.  MD to discuss PEG tube placement for long-term management, but may not be available until next week.  She has been started on steroids, olanzapine, mirtazapine, and prn anti-emetics to help with nausea and anorexia.  Pharmacy is consulted to dose TPN.  She is high risk for refeeding.  Significant events:  12/8 Pt refusing PO.  Pharmacy consulted for TPN, but NOT started d/t contraindication w/ functioning gut.  Pt and husband agree to try enteral feeding.   12/10 Glucerna tolerated at 20 ml/hr but had N/V when advanced to 30 ml/hr. 12/11 TF advanced to 40 ml/hr overnight, but pt had large volume emesis.  TF held, pt refusing all PO intake. 12/13 TPN initiated  Today, 03/30/2014:  Glucose - CBGs elevated since TPN initiation.  Currently on steroids, which can also elevate CBGs. Receiving Lantus and SSI.  Electrolytes - Phos elevated at 5, K low at 3.5, Mag WNL, CorrCa WNL at 9.28.    NaBicarb tabs ordered TID, refusing most doses.   Renal - SCr 1.63, elevated and increasing  LFTs - AST/ALT WNL, Tbili now WNL, Alk phos elevated  TGs - 65   Prealbumin: <3 (12/5), repeat pending  Patient took oral medications this AM and Ensure pudding. Discussed with Dr. Avie Echevaria is to continue TPN for now.  NUTRITIONAL GOALS  RD recs (12/11): 1800-2000 kCal, 100-110 grams of protein per day  Clinimix 5/15 at a goal rate of 83 ml/hr + 20% fat emulsion at 10 ml/hr to provide: 100 g/day protein, 1894 Kcal/day.  PLAN                                                                                                                         At 1800 today:  Change Clinimix 5/15 formulation to WITHOUT electrolytes due to elevated Phos.  Increase rate slowly to 50 ml/hr due to risk of refeeding syndrome.  20% fat emulsion at 10 ml/hr.  Plan to advance as  tolerated to the goal rate.  TPN to contain standard multivitamins and trace elements.  IVF rate per MD.  Continue Lantus 12 units daily.  Change SSI to moderate scale with continued blood sugar checks q4h.  TPN lab panels on Mondays & Thursdays.  KCl 10 mEq IV x 2 runs for hypokalemia.  CMET, Mag, Phos in AM  F/u daily, watch for refeeding.   Lindell Spar, PharmD, BCPS Pager: [phone] 03/30/2014 1:35 PM

## 2014-03-30 NOTE — Progress Notes (Signed)
Robin Schroeder   DOB:1966-05-01   MV#:[id number]   GGE#:[id number]  Patient Care Team: No Pcp Per Patient as PCP - General (General Practice)  Subjective: Events since 12/12 noted. Patient is asleep, did not disturb. Ill appearing. Per staff report, she does not engage in conversation. Intermittent nausea, vomiting controlled. Tmx 99.3. Oral intake poor. Denies chest pain. Continues to have vaginal and urethral bleeding (ASA and Lovenox d/c's on 12/13 due to this event and 2 units of blood given)  No other issues reported. Cultures negative to date.  Scheduled Meds: . albumin human  12.5 g Intravenous TID  . antiseptic oral rinse  7 mL Mouth Rinse q12n4p  . atorvastatin  20 mg Oral q1800  . bisacodyl  10 mg Rectal Once  .  ceFAZolin (ANCEF) IV  2 g Intravenous Q8H  . chlorhexidine  15 mL Mouth Rinse BID  . feeding supplement (ENSURE COMPLETE)  237 mL Oral TID BM  . feeding supplement (GLUCERNA 1.2 CAL)  1,000 mL Per Tube Q24H  . fentaNYL  12.5 mcg Transdermal Q72H  . fludrocortisone  0.1 mg Oral Daily  . hydrocortisone cream   Topical BID  . insulin aspart  0-9 Units Subcutaneous 4 times per day  . insulin glargine  12 Units Subcutaneous Daily  . methylPREDNISolone (SOLU-MEDROL) injection  40 mg Intravenous Daily  . metoCLOPramide (REGLAN) injection  5 mg Intravenous 3 times per day  . mirtazapine  15 mg Oral QHS  . OLANZapine zydis  5 mg Oral QHS  . pantoprazole (PROTONIX) IV  40 mg Intravenous Q12H  . sodium bicarbonate  650 mg Oral TID  . sodium chloride  10-40 mL Intracatheter Q12H   Continuous Infusions: . sodium chloride    . 0.9 % NaCl with KCl 20 mEq / L 10 mL/hr at 03/28/14 2145  . Marland KitchenTPN (CLINIMIX-E) Adult 40 mL/hr at 03/29/14 1702   And  . fat emulsion 250 mL (03/30/14 0300)  . phenylephrine (NEO-SYNEPHRINE) Adult infusion Stopped (03/23/14 0800)   PRN Meds:acetaminophen **OR** acetaminophen, chlorpheniramine-HYDROcodone, HYDROmorphone (DILAUDID) injection, ondansetron  **OR** ondansetron (ZOFRAN) IV, sodium chloride   Objective:  Filed Vitals:   03/30/14 0800  BP: 125/50  Pulse:   Temp: 98 F (36.7 C)  Resp: 15      Intake/Output Summary (Last 24 hours) at 03/30/14 4650 Last data filed at 03/30/14 0600  Gross per 24 hour  Intake 1119.83 ml  Output    646 ml  Net 473.83 ml    ECOG PERFORMANCE STATUS: 4  GENERAL:ill appearing patient is asleep, responds to verbal and tactile stimuli SKIN: remarkable for petechiae and echymmoses on lower extremities, skin frail LUNGS: decreased breath sounds at the bases anteriorly. HEART: regular rate & rhythm and no murmurs and 3-4+ lower extremity edema ABDOMEN:abdomen soft, non-tender and decreased bowel sounds Musculoskeletal:no cyanosis of digits and no clubbing     CBG (last 3)   Recent Labs  03/29/14 1604 03/30/14 0035 03/30/14 0548  GLUCAP 80 168* 176*     Labs:   Recent Labs Lab 03/27/14 1700 03/28/14 0430 03/29/14 0420 03/29/14 1120 03/30/14 0400  WBC 5.2 3.3* 3.8* 3.4* 3.4*  HGB 8.5* 7.0* 8.9* 8.7* 8.2*  HCT 26.3* 22.1* 27.3* 27.2* 25.2*  PLT 113* 79* 75* 74* 63*  MCV 85.4 87.4 87.8 88.0 89.0  MCH 27.6 27.7 28.6 28.2 29.0  MCHC 32.3 31.7 32.6 32.0 32.5  RDW 19.6* 19.6* 18.6* 18.6* 18.9*  LYMPHSABS  --   --   --   --  0.3*  MONOABS  --   --   --   --  0.2  EOSABS  --   --   --   --  0.0  BASOSABS  --   --   --   --  0.0     Chemistries:    Recent Labs Lab 03/26/14 0421 03/27/14 0450 03/28/14 0430 03/29/14 1120 03/30/14 0400  NA 138 141 143 145 146  K 3.5* 4.1 5.2 3.6* 3.5*  CL 106 108 111 112 113*  CO2 20 20 19 19 19   GLUCOSE 103* 74 64* 77 167*  BUN 52* 55* 59* 67* 74*  CREATININE 1.26* 1.27* 1.33* 1.55* 1.63*  CALCIUM 7.5* 7.4* 7.1* 7.8* 8.0*  MG 2.2  --   --  2.3 2.3  AST 52*  --  46* 35 25  ALT <5  --  <5 <5 <5  ALKPHOS 426*  --  392* 309* 245*  BILITOT 0.8  --  1.1 1.3* 0.9    GFR Estimated Creatinine Clearance: 40.3 mL/min (by C-G formula  based on Cr of 1.63).  Liver Function Tests:  Recent Labs Lab 03/25/14 1130 03/26/14 0421 03/28/14 0430 03/29/14 1120 03/30/14 0400  AST  --  52* 46* 35 25  ALT  --  <5 <5 <5 <5  ALKPHOS  --  426* 392* 309* 245*  BILITOT  --  0.8 1.1 1.3* 0.9  PROT  --  5.0* 4.8* 5.3* 5.1*  ALBUMIN 1.5* 1.3* 1.6* 2.4* 2.4*   No results for input(s): LIPASE, AMYLASE in the last 168 hours. No results for input(s): AMMONIA in the last 168 hours.  Urine Studies     Component Value Date/Time   COLORURINE AMBER* 03/27/2014 1721   APPEARANCEUR TURBID* 03/27/2014 1721   LABSPEC 1.021 03/27/2014 1721   PHURINE 5.0 03/27/2014 1721   GLUCOSEU NEGATIVE 03/27/2014 1721   HGBUR LARGE* 03/27/2014 1721   BILIRUBINUR NEGATIVE 03/27/2014 1721   KETONESUR NEGATIVE 03/27/2014 1721   PROTEINUR 100* 03/27/2014 1721   UROBILINOGEN 0.2 03/27/2014 1721   NITRITE NEGATIVE 03/27/2014 1721   LEUKOCYTESUR LARGE* 03/27/2014 1721    Coagulation profile  Recent Labs Lab 03/28/14 1445  INR 1.70*    Cardiac Enzymes:  Recent Labs Lab 03/24/14 0335  TROPONINI 2.28*   BNP: Invalid input(s): POCBNP CBG:  Recent Labs Lab 03/29/14 0924 03/29/14 1238 03/29/14 1604 03/30/14 0035 03/30/14 0548  GLUCAP 97 79 80 168* 176*  Lipid Profile  Recent Labs  03/30/14 0400  TRIG 65   Thyroid function studies No results for input(s): TSH, T4TOTAL, T3FREE, THYROIDAB in the last 72 hours.  Invalid input(s): FREET3 Microbiology Recent Results (from the past 240 hour(s))  Culture, blood (routine x 2)     Status: None   Collection Time: 03/20/14  5:30 PM  Result Value Ref Range Status   Specimen Description BLOOD RIGHT HAND  Final   Special Requests BOTTLES DRAWN AEROBIC ONLY 8CC  Final   Culture  Setup Time   Final    03/21/2014 01:05 Performed at 14/08/2013    Culture   Final    NO GROWTH 5 DAYS Performed at [facility]    Report Status 03/27/2014 FINAL  Final  Culture, blood  (routine x 2)     Status: None   Collection Time: 03/20/14  5:37 PM  Result Value Ref Range Status   Specimen Description BLOOD LEFT ARM  Final   Special Requests   Final  BOTTLES DRAWN AEROBIC ONLY BLUE BOTTLE 9 CC, RED BOTTLE 1 CC   Culture  Setup Time   Final    03/21/2014 01:06 Performed at [facility]    Culture   Final    NO GROWTH 5 DAYS Performed at [facility]    Report Status 03/27/2014 FINAL  Final  Body fluid culture     Status: None   Collection Time: 03/25/14  6:37 PM  Result Value Ref Range Status   Specimen Description SYNOVIAL RIGHT KNEE  Final   Special Requests NONE  Final   Gram Stain   Final    FEW WBC PRESENT,BOTH PMN AND MONONUCLEAR NO ORGANISMS SEEN Performed at [facility]    Culture   Final    NO GROWTH 3 DAYS Performed at [facility]    Report Status 03/29/2014 FINAL  Final  Body fluid culture     Status: None (Preliminary result)   Collection Time: 03/25/14  6:46 PM  Result Value Ref Range Status   Specimen Description SYNOVIAL LEFT KNEE  Final   Special Requests NONE  Final   Gram Stain   Final    ABUNDANT WBC PRESENT,BOTH PMN AND MONONUCLEAR NO ORGANISMS SEEN Performed at [facility]    Culture   Final    RARE STAPHYLOCOCCUS AUREUS Performed at [facility]    Report Status PENDING  Incomplete       Imaging Studies:  Ct Abdomen Pelvis Wo Contrast  03/28/2014   CLINICAL DATA:  patient with history of hypertension, obesity, DM, chronic diastolic CHF, possible rheumatoid arthritis, recent hospitalization for MSSA bacteremia of unknown source/neg TEE- cleared after Vancomycin (PCN allergy), went to SNF and then discharged from there to home, presented with complaints of not eating or drinking 4 days and husband was concerned that patient was trying to hurt herself and she was IVC and brought to ED where she was assessed as sepsis of unclear source and admitted to stepdown for further  management. Patient went into septic shock requiring pressors.  Patient with renal failure.  No contrast utilized.  EXAM: CT ABDOMEN AND PELVIS WITHOUT CONTRAST  TECHNIQUE: Multidetector CT imaging of the abdomen and pelvis was performed following the standard protocol without IV contrast.  COMPARISON:  None.  FINDINGS: Small bilateral pleural effusions, larger on the left. There is dependent lower lobe opacity, also greater on the left, most likely atelectasis. Left lower lobe pneumonia should be considered given history. There is no pulmonary edema. Heart is normal in size.  There is small amount of low-density material that lies adjacent to the distal esophagus in the lower mediastinum just above the esophageal hiatus. This measures 3.2 cm x 2.7 cm x 2.8 cm. Is likely a focal collection of fluid. It could reflect an enlarged lymph node.  Unremarkable liver.  Gallbladder is distended. A single gallstone lies in the dependent aspect of the gallbladder. There is no gallbladder wall thickening or convincing pericholecystic inflammation. No bile duct dilation or stone.  Abnormal spleen. Spleen is enlarged measuring 17.7 cm x 6.7 cm x 14.5 cm. There are irregular low-density areas throughout the spleen. These may reflect areas of infarction are nonspecific on this unenhanced study.  Pancreas is partly fatty replaced but otherwise unremarkable. No adrenal masses.  Ill-defined low-density lesion arises from the posterior midpole of the left kidney measuring 2.6 cm. This is likely a cyst. No other renal masses, no stones and no hydronephrosis. Normal ureters. Bladder is  decompressed by a Foley catheter.  Uterus is unremarkable.  No adnexal masses.  No pathologically enlarged lymph nodes.  Small to moderate amount of ascites.  No evidence of an abscess.  No colon or small bowel wall thickening or dilation. No bowel inflammatory change. Normal appendix visualized.  Mild atherosclerotic calcifications noted along a normal  caliber abdominal aorta and its branch vessels.  There is diffuse subcutaneous soft tissue edema.  Marked bilateral concentric hip joint space narrowing. Mild degenerative changes noted of the visualized spine. Bones are demineralized. No osteoblastic or osteolytic lesions.  IMPRESSION: 1. Small, left greater right, bilateral pleural effusions, small to moderate ascites and diffuse subcutaneous edema. Findings reflect anasarca of unclear etiology. 2. Left lower lobe lung base opacity, which may all be atelectasis. Consider pneumonia given the provided history. 3. Abnormal spleen. Spleen is enlarged to a maximum of 17.7 cm. It shows multiple hypo attenuating areas that may reflect infarcts but are nonspecific. Cause of the splenomegaly is unclear. Possible etiologies include infection, hemolytic anemias, neoplastic disease is including leukemia and lymphoma and areas metabolic disorders. Increased venous drainage pressure such as from splenic vein thrombosis should also be considered. 4. Other chronic findings as detailed. No other evidence of a source of infection.   Electronically Signed   By: Amie Portland M.D.   On: 03/28/2014 11:46   US Pelvis Complete  03/28/2014   CLINICAL DATA:  Vaginal bleeding. Patient did not allow endovaginal imaging.  EXAM: TRANSABDOMINAL ULTRASOUND OF PELVIS  TECHNIQUE: Transabdominal ultrasound examination of the pelvis was performed including evaluation of the uterus, ovaries, adnexal regions, and pelvic cul-de-sac.  COMPARISON:  None.  FINDINGS: Uterus  Measurements: 8.7 cm x 3.8 cm x 5.5 cm. No uterine mass. Cervix is unremarkable.  Endometrium  Thickness: 14 mm.  No focal endometrial mass.  No endometrial fluid.  Right ovary  Not visualized.  No adnexal masses.  Left ovary  Not visualized.  No adnexal masses.  Other findings: The small to moderate amount of ascites is seen adjacent to the uterus and on the right lower and left lower quadrants.  IMPRESSION: 1. Thickened  endometrium for this patient's age. In the setting of post-menopausal bleeding, endometrial sampling is indicated to exclude carcinoma. If results are benign, sonohysterogram should be considered for focal lesion work-up. (Ref: Radiological Reasoning: Algorithmic Workup of Abnormal Vaginal Bleeding with Endovaginal Sonography and Sonohysterography. AJR 2008; 115:B26-20) 2. No uterine masses. Neither ovary visualized. Small to moderate amount of ascites.   Electronically Signed   By: Amie Portland M.D.   On: 03/28/2014 11:31    Assessment/Plan: 58 y.o.  Anemia Thrombocytopenia In the setting of acute on chronic illness, including vaginal and urethral bleeding. CT abdomen shows splenomegaly ASA and Lovenox on hold S/p transfusion of 2 units of blood on 12/13.baseline is 9. Smear unremarkable.   Transfuse platelets if less than 20k or if  patient is bleeding.  New diagnosis of RA On IV solumedrol and pain meds as she is not able to tolerate oral meds  Septic Shock with MSSA bacteremia E Coli UTI Osteomyelitis of L knee Cultures pending, negative to date Continue antibiotic therapy and supportive care Appreciate CCM involvement  Inferolateral wall STEMI EF 50-55 % with  New distal inferior and apical hypokinesis per ECHO Appreciate Cards follow up  Anasarca In the setting of malnutrition Intolerant to tube feeding, switched to TPN Other medical issues as per admitting team  DVT prophylaxis On SCDs  Code Status: Prognosis is poor.  She is limited code. Discussions among [facility] team and family ongoing.   Other medical issues, including oliguria of renal failure, depression, CHF, deconditioning, nausea, anasarca, etc  as per primary team.  Marcos Eke, PA-C 03/30/2014  9:26 AM   ADDENDUM:  I saw and examined the patient. I agree with the above note.  I think if bleeding is a problem, then transfuse her with platelets probably will be a good idea.  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 I don't see anything obvious that would suggest a malignancy (i.e. splenic lymphoma) that we would have to worry about.  I think her being septic with this staph infection is causing some suppression.  We will continue to follow along closely.

## 2014-03-30 NOTE — Progress Notes (Signed)
Chaplain present on unit.  Follow up with pt d/t spiritual care consult indicating family would like daily chaplain support.  Pt asleep upon chaplain arrival no family in room.  Will follow up for continued support when pt is awake / family present.

## 2014-03-30 NOTE — Progress Notes (Addendum)
Pt sleeping. Not responding to voice.

## 2014-03-30 NOTE — Progress Notes (Signed)
Attempted to see [name] again this evening. She again does not converse with me.  Husband has been coming by in AM. I will stop by tomorrow morning to see if we can continue conversations from weekend.  Now with Staph Aureus septic arthritis identified.  I have a hard time saying [name] has capacity at this point given that she will not even engage me in brief conversation about illness insight.  Will fu in AM.   Orvis Brill D.O. Palliative Medicine Team at [facility]  Pager: [phone] Team Phone: [phone]

## 2014-03-30 NOTE — Progress Notes (Signed)
[facility] Progress Note   Subjective: denies pain, oriented, very weak  Filed Vitals:   03/30/14 0800 03/30/14 1000 03/30/14 1200 03/30/14 1400  BP: 125/50 125/51 133/54 121/51  Pulse:      Temp: 98 F (36.7 C)  98 F (36.7 C)   TempSrc: Oral  Oral   Resp: 15 14 19 19   Height:      Weight:      SpO2: 94% 95% 94% 93%   Exam: Ill appearing, somnolent, arouses No jvd Chest is clear bilat RRR no MRG Abd obese, diffuse petechiae, nontender, no HSM or ascites Bilat diffuse edema UE's and LE's 2-3+ Neuro is diffusely weak, oriented x 3, requires staff to help with eating/drinking  UA (lab) 12/11 - large LE, large Hb, 100 prot, 21-50 wbc, 11-20 rbc, rare epi, few bact, hyaline casts UPC ratio 3.71       Assessment: 1. AKI - nonoliguric, creat rising to 1.63 today. UOP ~800 cc/day. Cause hemodynamic, vs immune-complex vs other. BP's normal, wt's are stable.   2. MSSA bacteremia/ L knee septic arthritis - on abx per ID 3. EColi UTI 4. Severe malnutrition / hypoalbuminemia - on TNA D#2, will d/c IV albumin 5. Hx of rheumatoid arthritis, was steroid-dependent on admission 6. Hx MSSA sepsis Oct 2015 - TEE was negative 7. 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 Acute MI / inferior wall by ECHO / +trop peak > 20  Plan- cont supportive care, prognosis is not good overall.  She is not a good candidate for RRT, agree w palliative care consult. D/C IV alb and IVF's.  Will follow.     Nov 2015 MD  pager [phone]    cell [phone]  03/30/2014, 3:29 PM     Recent Labs Lab 03/26/14 0421  03/28/14 0430 03/29/14 1120 03/30/14 0400  NA 138  < > 143 145 146  K 3.5*  < > 5.2 3.6* 3.5*  CL 106  < > 111 112 113*  CO2 20  < > 19 19 19   GLUCOSE 103*  < > 64* 77 167*  BUN 52*  < > 59* 67* 74*  CREATININE 1.26*  < > 1.33* 1.55* 1.63*  CALCIUM 7.5*  < > 7.1* 7.8* 8.0*  PHOS 4.3  --   --  4.7* 5.0*  < > = values in this interval not  displayed.  Recent Labs Lab 03/28/14 0430 03/29/14 1120 03/30/14 0400  AST 46* 35 25  ALT <5 <5 <5  ALKPHOS 392* 309* 245*  BILITOT 1.1 1.3* 0.9  PROT 4.8* 5.3* 5.1*  ALBUMIN 1.6* 2.4* 2.4*    Recent Labs Lab 03/29/14 0420 03/29/14 1120 03/30/14 0400  WBC 3.8* 3.4* 3.4*  NEUTROABS  --   --  2.9  HGB 8.9* 8.7* 8.2*  HCT 27.3* 27.2* 25.2*  MCV 87.8 88.0 89.0  PLT 75* 74* 63*   . albumin human  12.5 g Intravenous TID  . antiseptic oral rinse  7 mL Mouth Rinse q12n4p  . atorvastatin  20 mg Oral q1800  . bisacodyl  10 mg Rectal Once  .  ceFAZolin (ANCEF) IV  2 g Intravenous Q8H  . chlorhexidine  15 mL Mouth Rinse BID  . feeding supplement (ENSURE COMPLETE)  237 mL Oral TID BM  . fentaNYL  12.5 mcg Transdermal Q72H  . fludrocortisone  0.1 mg Oral Daily  . hydrocortisone cream   Topical BID  . insulin aspart  0-15 Units Subcutaneous 6  times per day  . insulin glargine  12 Units Subcutaneous Daily  . methylPREDNISolone (SOLU-MEDROL) injection  40 mg Intravenous Daily  . metoCLOPramide (REGLAN) injection  5 mg Intravenous 3 times per day  . mirtazapine  15 mg Oral QHS  . OLANZapine zydis  5 mg Oral QHS  . pantoprazole (PROTONIX) IV  40 mg Intravenous Q12H  . phytonadione (VITAMIN K) IV  10 mg Intravenous q morning - 10a  . sodium bicarbonate  650 mg Oral TID  . sodium chloride  10-40 mL Intracatheter Q12H   . sodium chloride 75 mL/hr at 03/30/14 1050  . 0.9 % NaCl with KCl 20 mEq / L 10 mL/hr at 03/28/14 2145  . Marland KitchenTPN (CLINIMIX-E) Adult 40 mL/hr at 03/29/14 1702   And  . fat emulsion 250 mL (03/30/14 0300)  . TPN (CLINIMIX) Adult without lytes     And  . fat emulsion    . phenylephrine (NEO-SYNEPHRINE) Adult infusion Stopped (03/23/14 0800)   acetaminophen **OR** acetaminophen, chlorpheniramine-HYDROcodone, HYDROmorphone (DILAUDID) injection, ondansetron **OR** ondansetron (ZOFRAN) IV, sodium chloride

## 2014-03-30 NOTE — Progress Notes (Signed)
Nurse related husband's request for a Chaplain visit, and for regular visits to the patient.  Prayed for the patient, as she was sleeping or unresponsive, both on a quick visit in the morning to talk to the nurse, and on a longer one in the afternoon.  Patient listed as failure to thrive.  No family present.  Rema Jasmine, Chaplain Pager: [phone]

## 2014-03-30 NOTE — Progress Notes (Addendum)
PROGRESS NOTE    Robin Schroeder MMN:[id number] DOB: [birth year] DOA: 03/19/2014 PCP: No PCP Per Patient  HPI/Brief narrative 58 year old female patient with history of hypertension, obesity, DM, chronic diastolic CHF, possible rheumatoid arthritis, recent hospitalization for MSSA bacteremia of unknown source/neg TEE- cleared after Vancomycin (PCN allergy), went to SNF and then discharged from their to home, presented with complaints of not eating or drinking 4 days and husband was concerned that patient was trying to hurt herself and she was IVC and brought to ED where she was assessed as sepsis of unclear source and admitted to stepdown for further management. Patient went into septic shock requiring pressors. She also developed STEMI but poor overall candidate for aggressive intervention i.e. Currently. CCM, ID and cardiology assisting with care. Shock resolved- pressors DC'ed 12/7 AM.   Assessment/Plan:  1. Septic shock secondary to MSSA bacteremia: Continue cefazolin. Source not clear-? Status post arthrocentesis. Cell count from B/L knee synovial fluid consistent with inflammatory arthritis, culture pending. Infectious disease follow-up appreciated. . Shock improved/resolved and DC'd pressors 12/7 AM. She has been off IV hydrocortisone . Currently on prednisone,  Fludrocortisone. As per ID, will eventually need repeat TEE when medically stable. Obtain MRI of left knee,foot ,ankle , attempt to do this under no sedation today.if not tolerated then Needs conscious sedation,. Surveillance blood cultures 2 (12/4): Negative to date.  PICC line  placed after ID  Approval.  Stop date would be 04/21/14.   2. ? Osteomyelitis of medial left knee on x-ray: ID follow-up appreciated. MRI of left knee when stable. Patient has not been stable enough to have this done .   3. Left lower lobe airspace disease: Possibly chronic.? Doubt that the patient has pneumonia.    4.  hyponatremia: Resolved.     5. Inferiolateral wall STEMI: Patient has not complained of chest pain since admission. Troponins steadily increased to >20 on 12/3. Not candidate for aggressive intervention i.e. cath secondary to unstable status from shock, anemia and thrombocytopenia. Patient treated with 48 hours of IV heparin-DC'd 12/7 and was placed on aspirin-but DC due to bleeding . Will eventually need cardiac catheterization when stable. 2-D echo shows EF of 50-55% the distal inferior and apical hypokinesis that is new.. .  Cards will clear her for MRI as conscious sedation is needed, also determine timing of TEE .   6. IVC/?? Intention to self harm: IVC removed . Psychiatry consultation note reviewed . Discussed with Dr. Harrington Challenger 12/5. Patient has capacity to make her own medical decisions and living arrangement. Meds adjusted by psyche . No evidence of imminent risk to self. Sitter discontinued.   7. Anasarca: Secondary to ongoing illnesses, malnutrition and hypo-albuminemia.worsening , Treat underlying cause and attempt to improve nutritional status. Tube feeding held  because of nausea. Intolerant to tube feeding. Therefore switch to TPN.   8. Right sacral decubitus: Does not appear grossly infected. Wound care consultation requested.  9. Hypertension: Patient in septic shock. Shock is improving.  10. Type II DM: Uncontrolled. Likely worsened by steroids. Continue  Lantus-  continue SSI.   11. New diagnosis of rheumatoid arthritis: Patient was on prednisone at home. Patient placed on 20 mg of prednisone. Unable to tolerate by mouth medicines therefore changed to IV Solu-Medrol minimum dose of 40 mg. RA related pain was probably the reason she had been bed bound since recent DC from SNF. Patient will have OP Rheumatology consultation . In the meantime pain not controlled. Will start the patient on a low-dose fentanyl patch to see if  it helps.   12. Chronic diastolic CHF: Generalized anasarca but probably intravascularly dehydrated due to third spacing.  13. Hypokalemia: Replaced.   14. Anemia: Acute on chronic. Vaginal bleeding , splenomegaly on CT scan. ,  Baseline hemoglobin probably in the 9 g per DL range. Status post 1 unit PRBC 12/4 with appropriate response. Hemoglobin trending down, now with vaginal bleeding ,obtain stat CBC , type and screen, DC asa and lovenox, continue step down for higher nursing needs , hemoglobin stable. Appreciate hematology input.. Recommend platelet transfusion and the patient is actively bleeding. INR 1.7 probably secondary to poor oral intake. Administer vitamin K to see if the INR improves. Repeat INR in the morning   15. Thrombocytopenia: Possibly from acute illness/sepsis. 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 No suspicion for myelodysplasia. No indication for bone marrow biopsy. May have some underlying platelet dysfunction contribution to the bleeding. If profoundly bleeding please transfuse platelets.   16. Multiple pressure wounds: Details as per [facility] nurse note on 12/4. Patient has left heel DTI, right arm full-thickness abrasion and unstageable right buttock wound. Reconsult wound care because of bleeding sacral wounds.   17. Failure to thrive: Multifactorial   18. Intractable nausea. Patient started on Protonix IV, discontinued prednisone by mouth, unable to tolerate tube feeding, started on IV Reglan     19. Severe deconditioning: Severe protein calorie malnutrition: Dietitian consulted. Patient did start enteral feeding but not tolerating it well. Discussed PEG tube placement with the husband. Do not anticipate that this will be done up until next week after   cardiology has cleared patient for conscious sedation   20. Depressive disorder secondary to general medical condition: Management per psychiatry. zyprexa  added. Requested psychiatry to reassess.     21. oliguria renal failure-DC albumin,  IV fluids. Per nephrology. Appreciate nephrology input : Likely secondary to third spacing of fluids in the setting of severe protein calorie malnutrition and intravascular dehydration. .   22. E Coli UTI: Continue Ancef- sensitive.   23. Mild non-anion gap metabolic acidosis: Unclear etiology. Start by mouth bicarbonate. Follow BMP in a.m.  DVT: unable to tolerate locenox, scd's ,asa  Code Status: Limited code Family Communication:  I have been having daily discussion with the husband, recommend palliative care  meeting for goals of care as prognosis is poor. Case management to see the patient would be a candidate for LTAC Disposition Plan: transfer to tele . Continue TPN   Consultants:  Cardiology  ID  Psychiatry  PCCM-Signed off  Procedures:  Arterial line-discontinued  Foley catheter  Antibiotics:  IV Vanc 12/3>DC'd   IV Aztreonam 12/3> 12/4   IV Ancef.  Subjective: Patient still has ongoing bleeding from her vagina Minimally conversive   Objective: Filed Vitals:   03/30/14 0300 03/30/14 0400 03/30/14 0600 03/30/14 0800  BP:  122/51 130/53 125/50  Pulse:      Temp:  99 F (37.2 C)  98  F (36.7 C)  TempSrc:  Axillary  Oral  Resp:  14 14 15   Height:      Weight: 91.6 kg (201 lb 15.1 oz)     SpO2:  94% 94% 94%   blood pressures actually better than listed bowel-as discussed with nursing systolic in the 111B and map 73   Intake/Output Summary (Last 24 hours) at 03/30/14 1039 Last data filed at 03/30/14 0600  Gross per 24 hour  Intake 1119.83 ml  Output    646 ml  Net 473.83 ml   Filed Weights   03/27/14 1825 03/29/14 0400 03/30/14 0300  Weight: 90.5 kg (199 lb 8.3 oz) 90.3 kg (199 lb 1.2 oz) 91.6 kg (201 lb 15.1 oz)     Exam:  General exam: Pleasant middle-aged female, moderately built and obese lying comfortably supine in bed. Respiratory system: Slightly diminished breath sounds in the bases but  otherwise clear to auscultation. No increased work of breathing. Cardiovascular system: S1 & S2 heard, RRR. No JVD, murmurs, gallops, clicks. Anasarca. Telemetry: SB in the 50s-SR. Gastrointestinal system: Abdomen is nondistended, soft and nontender. Normal bowel sounds heard. Patient has patchy ecchymosis over right upper quadrant. Central nervous system: Alert and oriented 3. No focal neurological deficits. Extremities: Symmetric 5 x 5 power. Diffuse anasarca Skin: Dry appearing right sacral decubitus without signs of overt infection. Patient has skin tear over the right posterior upper arm. Left heel DTI without signs of acute infection. Psychiatry: Pleasant and comfortable today. Denies suicidal ideations.   Data Reviewed: Basic Metabolic Panel:  Recent Labs Lab 03/26/14 0421 03/27/14 0450 03/28/14 0430 03/29/14 1120 03/30/14 0400  NA 138 141 143 145 146  K 3.5* 4.1 5.2 3.6* 3.5*  CL 106 108 111 112 113*  CO2 20 20 19 19 19   GLUCOSE 103* 74 64* 77 167*  BUN 52* 55* 59* 67* 74*  CREATININE 1.26* 1.27* 1.33* 1.55* 1.63*  CALCIUM 7.5* 7.4* 7.1* 7.8* 8.0*  MG 2.2  --   --  2.3 2.3  PHOS 4.3  --   --  4.7* 5.0*   Liver Function Tests:  Recent Labs Lab 03/25/14 1130 03/26/14 0421 03/28/14 0430 03/29/14 1120 03/30/14 0400  AST  --  52* 46* 35 25  ALT  --  <5 <5 <5 <5  ALKPHOS  --  426* 392* 309* 245*  BILITOT  --  0.8 1.1 1.3* 0.9  PROT  --  5.0* 4.8* 5.3* 5.1*  ALBUMIN 1.5* 1.3* 1.6* 2.4* 2.4*   No results for input(s): LIPASE, AMYLASE in the last 168 hours. No results for input(s): AMMONIA in the last 168 hours. CBC:  Recent Labs Lab 03/27/14 1700 03/28/14 0430 03/29/14 0420 03/29/14 1120 03/30/14 0400  WBC 5.2 3.3* 3.8* 3.4* 3.4*  NEUTROABS  --   --   --   --  2.9  HGB 8.5* 7.0* 8.9* 8.7* 8.2*  HCT 26.3* 22.1* 27.3* 27.2* 25.2*  MCV 85.4 87.4 87.8 88.0 89.0  PLT 113* 79* 75* 74* 63*   Cardiac Enzymes:  Recent Labs Lab 03/24/14 0335  TROPONINI 2.28*     BNP (last 3 results) No results for input(s): PROBNP in the last 8760 hours. CBG:  Recent Labs Lab 03/29/14 0924 03/29/14 1238 03/29/14 1604 03/30/14 0035 03/30/14 0548  GLUCAP 97 79 80 168* 176*    Recent Results (from the past 240 hour(s))  Culture, blood (routine x 2)     Status: None   Collection Time: 03/20/14  5:30 PM  Result Value Ref Range Status   Specimen Description BLOOD RIGHT HAND  Final   Special Requests BOTTLES DRAWN AEROBIC ONLY 8CC  Final   Culture  Setup Time   Final    03/21/2014 01:05 Performed at [place]   Final    NO GROWTH 5 DAYS Performed at [facility]    Report Status 03/27/2014 FINAL  Final  Culture, blood (routine x 2)     Status: None   Collection Time: 03/20/14  5:37 PM  Result Value Ref Range Status   Specimen Description BLOOD LEFT ARM  Final   Special Requests   Final    BOTTLES DRAWN AEROBIC ONLY BLUE BOTTLE 9 CC, RED BOTTLE 1 CC   Culture  Setup Time   Final    03/21/2014 01:06 Performed at [facility]    Culture   Final    NO GROWTH 5 DAYS Performed at [facility]    Report Status 03/27/2014 FINAL  Final  Body fluid culture     Status: None   Collection Time: 03/25/14  6:37 PM  Result Value Ref Range Status   Specimen Description SYNOVIAL RIGHT KNEE  Final   Special Requests NONE  Final   Gram Stain   Final    FEW WBC PRESENT,BOTH PMN AND MONONUCLEAR NO ORGANISMS SEEN Performed at [facility]    Culture   Final    NO GROWTH 3 DAYS Performed at [facility]    Report Status 03/29/2014 FINAL  Final  Body fluid culture     Status: None (Preliminary result)   Collection Time: 03/25/14  6:46 PM  Result Value Ref Range Status   Specimen Description SYNOVIAL LEFT KNEE  Final   Special Requests NONE  Final   Gram Stain   Final    ABUNDANT WBC PRESENT,BOTH PMN AND MONONUCLEAR NO ORGANISMS SEEN Performed at [facility]    Culture   Final     RARE STAPHYLOCOCCUS AUREUS Performed at [facility]    Report Status PENDING  Incomplete         Studies: No results found.      Scheduled Meds: . albumin human  12.5 g Intravenous TID  . antiseptic oral rinse  7 mL Mouth Rinse q12n4p  . atorvastatin  20 mg Oral q1800  . bisacodyl  10 mg Rectal Once  .  ceFAZolin (ANCEF) IV  2 g Intravenous Q8H  . chlorhexidine  15 mL Mouth Rinse BID  . feeding supplement (ENSURE COMPLETE)  237 mL Oral TID BM  . feeding supplement (GLUCERNA 1.2 CAL)  1,000 mL Per Tube Q24H  . fentaNYL  12.5 mcg Transdermal Q72H  . fludrocortisone  0.1 mg Oral Daily  . hydrocortisone cream   Topical BID  . insulin aspart  0-9 Units Subcutaneous 4 times per day  . insulin glargine  12 Units Subcutaneous Daily  . methylPREDNISolone (SOLU-MEDROL) injection  40 mg Intravenous Daily  . metoCLOPramide (REGLAN) injection  5 mg Intravenous 3 times per day  . mirtazapine  15 mg Oral QHS  . OLANZapine zydis  5 mg Oral QHS  . pantoprazole (PROTONIX) IV  40 mg Intravenous Q12H  . potassium chloride  10 mEq Intravenous Q1 Hr x 2  . sodium bicarbonate  650 mg Oral TID  . sodium chloride  10-40 mL Intracatheter Q12H   Continuous Infusions: . sodium chloride    . 0.9 % NaCl with  KCl 20 mEq / L 10 mL/hr at 03/28/14 2145  . Marland KitchenTPN (CLINIMIX-E) Adult 40 mL/hr at 03/29/14 1702   And  . fat emulsion 250 mL (03/30/14 0300)  . phenylephrine (NEO-SYNEPHRINE) Adult infusion Stopped (03/23/14 0800)    Active Problems:   DM2 (diabetes mellitus, type 2)   Physical deconditioning   Hypoalbuminemia   Hyponatremia   Severe sepsis   Acute UTI   Protein-calorie malnutrition, severe   Sacral decubitus ulcer   Acute pulmonary edema   Effusion into joint   ST elevation myocardial infarction (STEMI) of inferolateral wall, initial episode of care   Severe sepsis with septic shock   Diabetic ulcer of left foot associated with diabetes mellitus due to underlying  condition   Osteomyelitis   Ulcer of heel   Acute osteomyelitis of femur   Septic arthritis of knee, left   STEMI (ST elevation myocardial infarction)   Ulcer of left heel   Metabolic acidosis   Bacteremia   Knee pain   Ileus   Nausea with vomiting   Palliative care encounter   Appetite loss    Time spent: 40 minutes.    Reyne Dumas, MD [facility] Pager [phone]  If 7PM-7AM, please contact night-coverage [email or web address] Password [email or web address] 03/30/2014, 10:39 AM    LOS: 11 days

## 2014-03-30 NOTE — Progress Notes (Addendum)
NUTRITION FOLLOW-UP  INTERVENTION: -TPN per pharmacy; recommend formula w/out electrolytes d/t elevated phos and transition to Clinimix E 5/15 as tolerated -Recommend refeeding labs (Phos/K/Mg) for at least next three days and replete as warranted -Discontinued Glucerna 1.2 d/t pt's refusal of NGT replacement -RD to continue to monitor  NUTRITION DIAGNOSIS: Inadequate oral intake related to decreased appetite/refusal of meals as evidenced by PO intake < 75% > one month; progressing with tube feeding  Goal: TF +PO intake to meet >/= 90% of their estimated nutrition needs, unmet    Monitor:  Total protein/energy intake, labs, weights, supplement acceptance, skin integrity   Admitting Dx: Septic shock secondary to staph bacteremia  ASSESSMENT: 58 year old female with multiple medical problems who was discharged from the hospital in October after she was treated for MSSA bacteremia with sepsis.  As per patient's husband, patient did not want to eat or drink and she is not eating and drinking for past 5 days and so he got concerned that she wants to hurt herself. So he filled out IVC and patient was brought by the EMS.  Pt reports eating better today, NT agreed. Pt is taking small sips and bites of meals, PO improved. Pt states that she likes the Glucerna shakes and was drinking one during visit.  RD encouraged pt to eat as much as tolerated to promote wound healing.   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 Recommended use of TF vs TPN; however it is likely pt will refuse TF access. Husband verbalized understanding and planned on discussing with pt.  Encouraged nutrition to assist with anasarca, skin integrity and improve deconditioning -Discussed options with RN. RN in agreement,  however also concerned with pt's acceptance of NG/NJ tube -Followed up with pt; however pt's husband not in room -Attempted to contact husband; however was unable to reach. Left voicemail. -RD to follow up tomorrow for decision. Pharmacy in agreement to hold of TPN until decision is made by family -Modified supplement to Ensure as husband believed that she might be more agreeable to different flavors  12/09 -Pt agreeable to initiate TF -NG tube being placed by RN at time of RD follow up -Discussed pt with pharmacy, PharmD noted placement of feeding tube being confirmed by x-ray with plan to start TF as soon as placement confirmed -Modified recommendation to Glucerna 1.2 d/t hx of DM2 -Recommend refeeding labs and conservative advancement, advancement rate can be increased pending lab results  12/10: -RN reported pt tolerating Glucerna 1.2 at 20 ml/hr; however when tube feed was advanced to 30 ml/hr, pt experienced nausea and vomiting. Residuals were 160 ml. RN paged MD, and was instructed to hold TF -Followed up with RN, who reported pt received anti-emetics, and was able to tolerate restarting TF at 20 ml/hr. Pt attributed nausea to oral medication/rinse -Current PO intake 0%, RN noted pt refusing meals and supplements. -Phos/Mg WNL -K Low -Continue with conservative advancement until pt able to tolerate >50% of tube feeding goal rate; replete as needed  12/11: -Per discussion with pt's RN, pt's TF advanced to 40 ml/hr overnight. Experienced large episode of vomiting. TF held.  RN attributes this to possible feeding tube misplacement.  -RN plan to order xray to confirm placement and to make appropriate adjustments -Recommend to restart at lower rate and conservative advancement to  goal rate -Continue with refeeding labs as pt continues to be unable to consistently met >50% estimated nutrition needs and TF goal rate. -Pt for possible PEG tube placement. MD discussing with pt's husband  during time of follow up. Likely next week -WOC to reconsult d/t worsening wounds on sacrum -Pt with 0% PO intake of meals or supplements. Husband may bring in preferred flavor of supplement. Contacted pharmacy but not available with current formulary -Phos/Mg/K WNL, however tube feeding currently held  12/14: -Pt refusing tube feeding since 12/12 -NGT removed (documentation unclear when this occurred, no results of abd x-ray for previoustube placement confirmation). Per RN documentation, pt continued with nausea, and refused NGT replacement. -Due to nausea/vomiting (which may have been resulting from tube misplacement) and pt's refusal for PO intake and TF re-initation, TPN was started on 12/13 -Palliative following. Last week MD noted plan for possible PEG, unclear at this time if pt would still be agreeable to this. MD noted continue plan to increase TPN as tolerated to goal rate -Clinimix 5/15 E infusing at 40 ml/hr. Due to elevated phos, recommend to transition to electrolyte free formula and transition as tolerated -Per pharmacy note, plan for Clinimix 5/15 at a goal rate of 83 ml/hr + 20% fat emulsion at 10 ml/hr to provide: 100 g/day protein, 1894 Kcal/day (100% est kcal and protein needs) -Discussed pt with pharmacy. Plan for daily follow ups for GOC and long term nutritional support goals. Appreciate assistance. -Mg/K WNL -CBG elevated, likely r/t to initiation of TPN   Height: Ht Readings from Last 1 Encounters:  03/27/14 5' 4"  (1.626 m)    Weight: Wt Readings from Last 1 Encounters:  03/30/14 201 lb 15.1 oz (91.6 kg)  03/27/14 205 lb  BMI:  Body mass index is 34.65 kg/(m^2).  Estimated Nutritional Needs: Kcal: 1800-2000 Protein: 100-110 gram Fluid: per MD  Skin: stg 2 pressure ulcer on heel and arm, deep tissue injury on heel, +4 generalized edema  Diet Order: Diet Carb Modified TPN (CLINIMIX-E) Adult TPN (CLINIMIX) Adult without lytes  EDUCATION NEEDS: -No  education needs identified at this time   Intake/Output Summary (Last 24 hours) at 03/30/14 1257 Last data filed at 03/30/14 0600  Gross per 24 hour  Intake 1079.83 ml  Output    526 ml  Net 553.83 ml    Last BM: 12/12  Labs:   Recent Labs Lab 03/26/14 0421  03/28/14 0430 03/29/14 1120 03/30/14 0400  NA 138  < > 143 145 146  K 3.5*  < > 5.2 3.6* 3.5*  CL 106  < > 111 112 113*  CO2 20  < > 19 19 19   BUN 52*  < > 59* 67* 74*  CREATININE 1.26*  < > 1.33* 1.55* 1.63*  CALCIUM 7.5*  < > 7.1* 7.8* 8.0*  MG 2.2  --   --  2.3 2.3  PHOS 4.3  --   --  4.7* 5.0*  GLUCOSE 103*  < > 64* 77 167*  < > = values in this interval not displayed.  CBG (last 3)   Recent Labs  03/29/14 1604 03/30/14 0035 03/30/14 0548  GLUCAP 80 168* 176*    Scheduled Meds: . albumin human  12.5 g Intravenous TID  . antiseptic oral rinse  7 mL Mouth Rinse q12n4p  . atorvastatin  20 mg Oral q1800  . bisacodyl  10 mg Rectal Once  .  ceFAZolin (ANCEF) IV  2 g Intravenous Q8H  . chlorhexidine  15 mL Mouth Rinse BID  . feeding supplement (ENSURE COMPLETE)  237 mL Oral TID BM  . fentaNYL  12.5 mcg Transdermal Q72H  . fludrocortisone  0.1 mg Oral Daily  . hydrocortisone cream   Topical BID  . insulin aspart  0-15 Units Subcutaneous 6 times per day  . insulin aspart  0-9 Units Subcutaneous 4 times per day  . insulin glargine  12 Units Subcutaneous Daily  . methylPREDNISolone (SOLU-MEDROL) injection  40 mg Intravenous Daily  . metoCLOPramide (REGLAN) injection  5 mg Intravenous 3 times per day  . mirtazapine  15 mg Oral QHS  . OLANZapine zydis  5 mg Oral QHS  . pantoprazole (PROTONIX) IV  40 mg Intravenous Q12H  . phytonadione (VITAMIN K) IV  10 mg Intravenous q morning - 10a  . potassium chloride  10 mEq Intravenous Q1 Hr x 2  . sodium bicarbonate  650 mg Oral TID  . sodium chloride  10-40 mL Intracatheter Q12H    Continuous Infusions: . sodium chloride 75 mL/hr at 03/30/14 1050  . 0.9 % NaCl  with KCl 20 mEq / L 10 mL/hr at 03/28/14 2145  . Marland KitchenTPN (CLINIMIX-E) Adult 40 mL/hr at 03/29/14 1702   And  . fat emulsion 250 mL (03/30/14 0300)  . TPN (CLINIMIX) Adult without lytes     And  . fat emulsion    . phenylephrine (NEO-SYNEPHRINE) Adult infusion Stopped (03/23/14 0800)    Atlee Abide MS RD LDN Clinical Dietitian [phone]

## 2014-03-30 NOTE — Progress Notes (Signed)
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 Penicillins Rash    Patient Measurements: Height: 5\' 4"  (162.6 cm) Weight: 201 lb 15.1 oz (91.6 kg) IBW/kg (Calculated) : 54.7  Vital Signs: Temp: 98 F (36.7 C) (12/14 1200) Temp Source: Oral (12/14 1200) BP: 125/50 mmHg (12/14 0800) Intake/Output from previous day: 12/13 0701 - 12/14 0700 In: 1189.8 [I.V.:240; IV Piggyback:300; TPN:649.8] Out: 756 [Urine:755; Stool:1] Intake/Output from this shift:    Labs:  Recent Labs  03/27/14 1721 03/28/14 0430 03/29/14 0420 03/29/14 1120 03/30/14 0400  WBC  --  3.3* 3.8* 3.4* 3.4*  HGB  --  7.0* 8.9* 8.7* 8.2*  PLT  --  79* 75* 74* 63*  LABCREA 43.45  --   --   --   --   CREATININE  --  1.33*  --  1.55* 1.63*   Estimated Creatinine Clearance: 40.3 mL/min (by C-G formula based on Cr of 1.63). No results for input(s): VANCOTROUGH, VANCOPEAK, VANCORANDOM, GENTTROUGH, GENTPEAK, GENTRANDOM, TOBRATROUGH, TOBRAPEAK, TOBRARND, AMIKACINPEAK, AMIKACINTROU, AMIKACIN in the last 72 hours.   Microbiology: Recent Results (from the past 720 hour(s))  Urine culture     Status: None   Collection Time: 03/19/14  3:04 PM  Result Value Ref Range Status   Specimen Description URINE, CATHETERIZED  Final   Special Requests NONE  Final   Culture  Setup Time   Final    03/20/2014 00:29 Performed at [facility] Count   Final    >=100,000 COLONIES/ML Performed at [facility]    Culture   Final    ESCHERICHIA COLI Note: Two isolates with different morphologies were identified as the same organism.The most resistant organism was reported. Performed at [facility]    Report Status 03/23/2014 FINAL  Final   Organism ID, Bacteria ESCHERICHIA COLI  Final      Susceptibility   Escherichia coli - MIC*    AMPICILLIN >=32  RESISTANT Resistant     CEFAZOLIN <=4 SENSITIVE Sensitive     CEFTRIAXONE <=1 SENSITIVE Sensitive     CIPROFLOXACIN <=0.25 SENSITIVE Sensitive     GENTAMICIN <=1 SENSITIVE Sensitive     LEVOFLOXACIN <=0.12 SENSITIVE Sensitive     NITROFURANTOIN <=16 SENSITIVE Sensitive     TOBRAMYCIN <=1 SENSITIVE Sensitive     TRIMETH/SULFA >=320 RESISTANT Resistant     PIP/TAZO <=4 SENSITIVE Sensitive     * ESCHERICHIA COLI  Blood Culture (routine x 2)     Status: None   Collection Time: 03/19/14  3:23 PM  Result Value Ref Range Status   Specimen Description BLOOD RIGHT WRIST  Final   Special Requests BOTTLES DRAWN AEROBIC AND ANAEROBIC 5 ML  Final   Culture  Setup Time   Final    03/20/2014 07:48 Performed at 14[facility]    Culture   Final    STAPHYLOCOCCUS AUREUS Note: RIFAMPIN AND GENTAMICIN SHOULD NOT BE USED AS SINGLE DRUGS FOR TREATMENT OF STAPH INFECTIONS. Note: Gram Stain Report Called to,Read Back By and Verified With: FAYE S BY INGRAM A 03/20/14 11AM Performed at [facility]    Report Status 03/22/2014 FINAL  Final   Organism ID, Bacteria STAPHYLOCOCCUS AUREUS  Final      Susceptibility   Staphylococcus aureus - MIC*    CLINDAMYCIN <=0.25 SENSITIVE Sensitive     ERYTHROMYCIN <=  0.25 SENSITIVE Sensitive     GENTAMICIN <=0.5 SENSITIVE Sensitive     LEVOFLOXACIN <=0.12 SENSITIVE Sensitive     OXACILLIN 0.5 SENSITIVE Sensitive     PENICILLIN RESISTANT      RIFAMPIN <=0.5 SENSITIVE Sensitive     TRIMETH/SULFA <=10 SENSITIVE Sensitive     VANCOMYCIN <=0.5 SENSITIVE Sensitive     TETRACYCLINE <=1 SENSITIVE Sensitive     MOXIFLOXACIN <=0.25 SENSITIVE Sensitive     * STAPHYLOCOCCUS AUREUS  Blood Culture (routine x 2)     Status: None   Collection Time: 03/19/14  3:23 PM  Result Value Ref Range Status   Specimen Description BLOOD BLOOD LEFT FOREARM  Final   Special Requests BOTTLES DRAWN AEROBIC AND ANAEROBIC 5 ML  Final   Culture  Setup Time   Final    03/20/2014  07:51 Performed at [facility]    Culture   Final    STAPHYLOCOCCUS AUREUS Note: SUSCEPTIBILITIES PERFORMED ON PREVIOUS CULTURE WITHIN THE LAST 5 DAYS. Note: Gram Stain Report Called to,Read Back By and Verified With: FAYE S BY INGRAM A 03/20/14 11AM Performed at [facility]    Report Status 03/22/2014 FINAL  Final  Culture, blood (routine x 2)     Status: None   Collection Time: 03/20/14  5:30 PM  Result Value Ref Range Status   Specimen Description BLOOD RIGHT HAND  Final   Special Requests BOTTLES DRAWN AEROBIC ONLY 8CC  Final   Culture  Setup Time   Final    03/21/2014 01:05 Performed at [facility]    Culture   Final    NO GROWTH 5 DAYS Performed at [facility]    Report Status 03/27/2014 FINAL  Final  Culture, blood (routine x 2)     Status: None   Collection Time: 03/20/14  5:37 PM  Result Value Ref Range Status   Specimen Description BLOOD LEFT ARM  Final   Special Requests   Final    BOTTLES DRAWN AEROBIC ONLY BLUE BOTTLE 9 CC, RED BOTTLE 1 CC   Culture  Setup Time   Final    03/21/2014 01:06 Performed at [facility]    Culture   Final    NO GROWTH 5 DAYS Performed at [facility]    Report Status 03/27/2014 FINAL  Final  Body fluid culture     Status: None   Collection Time: 03/25/14  6:37 PM  Result Value Ref Range Status   Specimen Description SYNOVIAL RIGHT KNEE  Final   Special Requests NONE  Final   Gram Stain   Final    FEW WBC PRESENT,BOTH PMN AND MONONUCLEAR NO ORGANISMS SEEN Performed at [facility]    Culture   Final    NO GROWTH 3 DAYS Performed at [facility]    Report Status 03/29/2014 FINAL  Final  Body fluid culture     Status: None (Preliminary result)   Collection Time: 03/25/14  6:46 PM  Result Value Ref Range Status   Specimen Description SYNOVIAL LEFT KNEE  Final   Special Requests NONE  Final   Gram Stain   Final    ABUNDANT WBC PRESENT,BOTH PMN AND  MONONUCLEAR NO ORGANISMS SEEN Performed at [facility]    Culture   Final    RARE STAPHYLOCOCCUS AUREUS Note: RIFAMPIN AND GENTAMICIN SHOULD NOT BE USED AS SINGLE DRUGS FOR TREATMENT OF STAPH INFECTIONS. CALLED TO KIMBERLY OKAFOR 03/30/14 1315 BY SMITHERSJ Performed at  [facility]    Report Status PENDING  Incomplete   Assessment: 89 yoF with RA and recent MSSA bacteremia 10/13, blood cx cleared on 10/16. Some notes claim pt placed on cefazolin 2gm Q8hr, however can only find that patient was on vancomycin per Epic records. Noted TEE on 10/19 , negative for vegetations. She was to finish 14 day course on 10/30 using 10/16 as day 1. She was discharged to SNF for IV abx and rehab. On 12/3 pt brought to ED for IVC as pt refusing care.  Patient was started on empiric vancomycin; changed to cefazolin with blood cx showing MSSA.  Also with E. Coli UTI sens to cefazolin.  Noted pt allergy to PCN, but has tolerated ceftriaxone and cefazolin well.  Per ID, to complete 6 wks cefazolin 2g IV q8h using start date 03/25/14, with source of recurrent bacteremia maybe due to L knee septic arthritis.  Will need TEE when stable.  MRI of L knee, foot, ankle scheduled for today.  12/3 >> Vancomycin >> 12/6 12/3 >> Aztreonam >>12/4 12/3 >> Levaquin x 1 dose 12/5 >> Cefazolin >>  Tmax: 99 WBC: 3.4 Renal: SCr 1.63, CrCl ~ 40 mL/min CG Lactate: 2.53 > 1.6  12/3 blood x2: 2/2 MSSA (R=PCN only) 12/3 urine: >100k GNR - Ecoli - resistant to amp and Bactrim only 12/4 acute hepatitis panel: all negative 12/4 HIV Ab: none reactive 12/4 blood x2: NGTD 12/9 Synovial fluid (R knee): NGF 12/9 Synovial fluid (L knee): rare Staph aureus  Goal of Therapy:  treatment of infection, adjustment per renal function   Plan:   Continue Cefazolin 2g IV q8h.  Dose remains appropriate for renal function.  Per ID, treating for 6 weeks, with start date of 03/25/14-therefore, today will be day 6/42.  Continue  to monitor renal function, cultures, clinical progress.   Greer Pickerel, PharmD, BCPS Pager: [phone] 03/30/2014 1:49 PM

## 2014-03-30 NOTE — Progress Notes (Signed)
PT Cancellation Note  Patient Details Name: Robin Schroeder MRN: [id number] DOB: [birth year]   Cancelled Treatment:     Pt unengaged and unable to tolerate.  Per chart review prognosis is poor.  Will consult LPT need to possibly D/C from Acute PT services and have MD re order if situation changes.  Discussed with PTA; Will D/C PT/sign-off at this time, re-order if need should arise; Thank you  Felecia Shelling  PTA [facility]  Acute  Rehab Pager      [phone]

## 2014-03-30 NOTE — Progress Notes (Signed)
[facility]    Date of Admission:  03/19/2014   Total days of antibiotics 12        Day 10 cefazolin           ID: Robin Schroeder is a 58 y.o. female with recurrent MSSA bacteremia from LEFT SEPTIC ARTHRITIS c/b inferior STEMI, AKI, deconditioned/bed bound, severe RA, depression, and anasarca from severe protein caloric malnutrition Active Problems:   DM2 (diabetes mellitus, type 2)   Physical deconditioning   Hypoalbuminemia   Hyponatremia   Severe sepsis   Acute UTI   Protein-calorie malnutrition, severe   Sacral decubitus ulcer   Acute pulmonary edema   Effusion into joint   ST elevation myocardial infarction (STEMI) of inferolateral wall, initial episode of care   Severe sepsis with septic shock   Diabetic ulcer of left foot associated with diabetes mellitus due to underlying condition   Osteomyelitis   Ulcer of heel   Acute osteomyelitis of femur   Septic arthritis of knee, left   STEMI (ST elevation myocardial infarction)   Ulcer of left heel   Metabolic acidosis   Bacteremia   Knee pain   Ileus   Nausea with vomiting   Palliative care encounter   Appetite loss    Subjective: Afebrile,  Has not been able to restart on tube feeds, now on TPN. Has ongoing vaginal bleeding receiving vitamin K. Sleeping and withdrawn, not answering questions this morning.    Medications:  . albumin human  12.5 g Intravenous TID  . antiseptic oral rinse  7 mL Mouth Rinse q12n4p  . atorvastatin  20 mg Oral q1800  . bisacodyl  10 mg Rectal Once  .  ceFAZolin (ANCEF) IV  2 g Intravenous Q8H  . chlorhexidine  15 mL Mouth Rinse BID  . feeding supplement (ENSURE COMPLETE)  237 mL Oral TID BM  . feeding supplement (GLUCERNA 1.2 CAL)  1,000 mL Per Tube Q24H  . fentaNYL  12.5 mcg Transdermal Q72H  . fludrocortisone  0.1 mg Oral Daily  . hydrocortisone cream   Topical BID  . insulin aspart  0-9 Units Subcutaneous 4 times per day  . insulin glargine  12  Units Subcutaneous Daily  . methylPREDNISolone (SOLU-MEDROL) injection  40 mg Intravenous Daily  . metoCLOPramide (REGLAN) injection  5 mg Intravenous 3 times per day  . mirtazapine  15 mg Oral QHS  . OLANZapine zydis  5 mg Oral QHS  . pantoprazole (PROTONIX) IV  40 mg Intravenous Q12H  . potassium chloride  10 mEq Intravenous Q1 Hr x 2  . sodium bicarbonate  650 mg Oral TID  . sodium chloride  10-40 mL Intracatheter Q12H    Objective: Vital signs in last 24 hours: Temp:  [98 F (36.7 C)-99.3 F (37.4 C)] 98 F (36.7 C) (12/14 0800) Pulse Rate:  [70-80] 80 (12/13 1600) Resp:  [14-17] 15 (12/14 0800) BP: (118-133)/(49-58) 125/50 mmHg (12/14 0800) SpO2:  [91 %-95 %] 94 % (12/14 0800) Weight:  [201 lb 15.1 oz (91.6 kg)] 201 lb 15.1 oz (91.6 kg) (12/14 0300) Physical Exam  Constitutional: . appears anasarcic, disheveled.  looks fatigue. Does not answer questions. Cardiovascular: Normal rate, regular rhythm and normal heart sounds. Exam reveals no gallop and no friction rub.  No murmur heard.  Pulmonary/Chest: Effort normal and breath sounds normal. No respiratory distress.  has no wheezes.  Abdominal: mildlly distension. Decrease bowel sounds.There is no tenderness.  Skin:d heel ulcer to medial  aspect of left leg. echymosis to right foot sole, decub to right calf Ext: diffusely anasarca are equal in L and R arm, more so that anasarca to lower extremities. Psychiatric: flat affect, not responding to questions  Lab Results  Recent Labs  03/29/14 1120 03/30/14 0400  WBC 3.4* 3.4*  HGB 8.7* 8.2*  HCT 27.2* 25.2*  NA 145 146  K 3.6* 3.5*  CL 112 113*  CO2 19 19  BUN 67* 74*  CREATININE 1.55* 1.63*   Lab Results  Component Value Date   ESRSEDRATE 90* 03/21/2014   Lab Results  Component Value Date   CRP 16.1* 03/21/2014   Arthrocentesis =  40,000 cells 88%N and 11,050 95% N and no cells. Cloudy with rBC Microbiology: 12/3 blood cx x 2 MSSA 12/4 blood cx NGTD 12/9  left knee arthrocentesis = Staph aureus 12/9 right knee arthrocentesis = NGTD  Studies/Results: Ct Abdomen Pelvis Wo Contrast  03/28/2014   CLINICAL DATA:  patient with history of hypertension, obesity, DM, chronic diastolic CHF, possible rheumatoid arthritis, recent hospitalization for MSSA bacteremia of unknown source/neg TEE- cleared after Vancomycin (PCN allergy), went to SNF and then discharged from there to home, presented with complaints of not eating or drinking 4 days and husband was concerned that patient was trying to hurt herself and she was IVC and brought to ED where she was assessed as sepsis of unclear source and admitted to stepdown for further management. Patient went into septic shock requiring pressors.  Patient with renal failure.  No contrast utilized.  EXAM: CT ABDOMEN AND PELVIS WITHOUT CONTRAST  TECHNIQUE: Multidetector CT imaging of the abdomen and pelvis was performed following the standard protocol without IV contrast.  COMPARISON:  None.  FINDINGS: Small bilateral pleural effusions, larger on the left. There is dependent lower lobe opacity, also greater on the left, most likely atelectasis. Left lower lobe pneumonia should be considered given history. There is no pulmonary edema. Heart is normal in size.  There is small amount of low-density material that lies adjacent to the distal esophagus in the lower mediastinum just above the esophageal hiatus. This measures 3.2 cm x 2.7 cm x 2.8 cm. Is likely a focal collection of fluid. It could reflect an enlarged lymph node.  Unremarkable liver.  Gallbladder is distended. A single gallstone lies in the dependent aspect of the gallbladder. There is no gallbladder wall thickening or convincing pericholecystic inflammation. No bile duct dilation or stone.  Abnormal spleen. Spleen is enlarged measuring 17.7 cm x 6.7 cm x 14.5 cm. There are irregular low-density areas throughout the spleen. These may reflect areas of infarction are nonspecific  on this unenhanced study.  Pancreas is partly fatty replaced but otherwise unremarkable. No adrenal masses.  Ill-defined low-density lesion arises from the posterior midpole of the left kidney measuring 2.6 cm. This is likely a cyst. No other renal masses, no stones and no hydronephrosis. Normal ureters. Bladder is decompressed by a Foley catheter.  Uterus is unremarkable.  No adnexal masses.  No pathologically enlarged lymph nodes.  Small to moderate amount of ascites.  No evidence of an abscess.  No colon or small bowel wall thickening or dilation. No bowel inflammatory change. Normal appendix visualized.  Mild atherosclerotic calcifications noted along a normal caliber abdominal aorta and its branch vessels.  There is diffuse subcutaneous soft tissue edema.  Marked bilateral concentric hip joint space narrowing. Mild degenerative changes noted of the visualized spine. Bones are demineralized. No osteoblastic or osteolytic lesions.  IMPRESSION: 1. Small, left greater right, bilateral pleural effusions, small to moderate ascites and diffuse subcutaneous edema. Findings reflect anasarca of unclear etiology. 2. Left lower lobe lung base opacity, which may all be atelectasis. Consider pneumonia given the provided history. 3. Abnormal spleen. Spleen is enlarged to a maximum of 17.7 cm. It shows multiple hypo attenuating areas that may reflect infarcts but are nonspecific. Cause of the splenomegaly is unclear. Possible etiologies include infection, hemolytic anemias, neoplastic disease is including leukemia and lymphoma and areas metabolic disorders. Increased venous drainage pressure such as from splenic vein thrombosis should also be considered. 4. Other chronic findings as detailed. No other evidence of a source of infection.   Electronically Signed   By: Amie Portland M.D.   On: 03/28/2014 11:46    Assessment/Plan: Recurrent MSSA bacteremia likely associated with left knee septic arthritis. Unclear if other  sources since she hasnumerous pressure ulcers, unable to mobilize due to deconditioning and polyarthritis from untreated rheumatoid arthritis,(newly diagnosed in Oct 2015)  MSSA bacteremia = recurrent bacteremia maybe due to left knee septic arthritis (since cx grew Staph Aureus).  We will  treat for extended course with 6 wk with cefazolin 2gm IV q8hr for now using 12/9 as day 1 of 42.  Staph aureus septic arthritis = normally would have orthopedics wash out. Patient is in such poor deconditioned state, not sure what they would recommend. Would wait to consult ortho since patient will likely needs goals of care discussion. Recent STEMI, inferior infarct precludes her from any extensive sedation.  If she becomes clinically stable, may consider getting MRI of left foot to see if signs of osteomyelitis associate with heal decub.   aki with oligouria = She is intravascularly dry from anasarca and continue to have worsening anarsaca from fluid and pre renal azotemia with worsening cr. Currently getting blood tsf and albumin. Defer to renal fro further recs  Coagulopathy = getting vitamin k tom inimize bleeding and possibly plt tsf today  Anasarca  2/2 protein caloric malnutrition= currently on TPN. Would probably prefer to have peg or peg-j for her since TPN would place her at risk for candidemia. . Her malnutrition probably occurred over a period of a few months. Very difficult scenario since patient has severe anhedonia and not participating in discussion for wanting aggressive care or PEG. If she says yes to peg, may need cardiology input to whether she can tolerate sedation for procedure.   Pressure ulcer = continue with local wound care  Newly diagnosed Rheumatoid Arthritis = continue on prednisone 20 mg daily to see if she has temporary relief of joint pain, possibly increased appetite. I suspect her immobility is in part due other polyarthritis from RA.   - recommend to get case management  started on medicaid app so that she may have resources and access to care when she gets discharged  ACS/inferior STEMI= in setting of sepsis from MSSA. Cards recommending low dose asa and low dose metop for management.   Depression =   Appreciated Dr. Shela Commons input on management    Rick Warnick, [facility] Cell: [phone] Pager: [phone]  03/30/2014, 10:17 AM

## 2014-03-30 NOTE — Progress Notes (Signed)
MD made aware of rare amount of Staph growth in Synovial fluid.

## 2014-03-30 NOTE — Progress Notes (Signed)
CSW continuing to follow for potential disposition needs.   CSW reviewed chart and noted that pt having daily follow up from palliative care for goals of care and long term nutritional support goals.   CSW to continue to follow to assist with disposition needs as pt progresses and pt goals are determined.   Loletta Specter, MSW, LCSW Clinical Social Work [phone]

## 2014-03-30 NOTE — Progress Notes (Signed)
Summary Note: This unfortunate woman will only accept sips of Coke when she requests it, otherwise she will refuse to open her mouth for meds. She will not even accept SL meds, therefore she has not received any of her psychiatric meds. She denies/or will not take anything for pain when offered. She appears oriented and is only vague about about the seriousness of her condition. I inspected her perineal area where blood is reported. It appears that the blood is coming from her vagina, and stool is noted in the vagina also.No blood is noted coming from the urethra directly. She has significant anasarca; petechiae, bruising. Left leg painful to movement. Central TPN/ lipids started this evening. Prognosis looks poor. SCDs are not appropriate for the amount of edema in her lower extremities.

## 2014-03-31 DIAGNOSIS — A4101 Sepsis due to Methicillin susceptible Staphylococcus aureus: Secondary | ICD-10-CM

## 2014-03-31 LAB — CBC
HEMATOCRIT: 27.9 % — AB (ref 36.0–46.0)
HEMOGLOBIN: 8.9 g/dL — AB (ref 12.0–15.0)
MCH: 28.8 pg (ref 26.0–34.0)
MCHC: 31.9 g/dL (ref 30.0–36.0)
MCV: 90.3 fL (ref 78.0–100.0)
Platelets: 64 10*3/uL — ABNORMAL LOW (ref 150–400)
RBC: 3.09 MIL/uL — AB (ref 3.87–5.11)
RDW: 19.1 % — ABNORMAL HIGH (ref 11.5–15.5)
WBC: 4.1 10*3/uL (ref 4.0–10.5)

## 2014-03-31 LAB — GLUCOSE, CAPILLARY
GLUCOSE-CAPILLARY: 120 mg/dL — AB (ref 70–99)
Glucose-Capillary: 157 mg/dL — ABNORMAL HIGH (ref 70–99)
Glucose-Capillary: 110 mg/dL — ABNORMAL HIGH (ref 70–99)
Glucose-Capillary: 123 mg/dL — ABNORMAL HIGH (ref 70–99)
Glucose-Capillary: 163 mg/dL — ABNORMAL HIGH (ref 70–99)
Glucose-Capillary: 263 mg/dL — ABNORMAL HIGH (ref 70–99)
Glucose-Capillary: 317 mg/dL — ABNORMAL HIGH (ref 70–99)

## 2014-03-31 LAB — PROTIME-INR
INR: 1.29 (ref 0.00–1.49)
Prothrombin Time: 16.2 s — ABNORMAL HIGH (ref 11.6–15.2)

## 2014-03-31 LAB — COMPREHENSIVE METABOLIC PANEL
ALBUMIN: 2.3 g/dL — AB (ref 3.5–5.2)
ALT: <5 U/L (ref 0–35)
AST: 26 U/L (ref 0–37)
Alkaline Phosphatase: 260 U/L — ABNORMAL HIGH (ref 39–117)
Anion gap: 15 (ref 5–15)
BILIRUBIN TOTAL: 0.9 mg/dL (ref 0.3–1.2)
BUN: 80 mg/dL — AB (ref 6–23)
CHLORIDE: 113 meq/L — AB (ref 96–112)
CO2: 18 meq/L — AB (ref 19–32)
Calcium: 7.7 mg/dL — ABNORMAL LOW (ref 8.4–10.5)
Creatinine, Ser: 1.55 mg/dL — ABNORMAL HIGH (ref 0.50–1.10)
GFR calc Af Amer: 41 mL/min — ABNORMAL LOW (ref >90)
GFR calc non Af Amer: 35 mL/min — ABNORMAL LOW (ref >90)
Glucose, Bld: 113 mg/dL — ABNORMAL HIGH (ref 70–99)
Potassium: 3 mEq/L — ABNORMAL LOW (ref 3.7–5.3)
Sodium: 146 mEq/L (ref 137–147)
Total Protein: 5.2 g/dL — ABNORMAL LOW (ref 6.0–8.3)

## 2014-03-31 LAB — BODY FLUID CULTURE

## 2014-03-31 LAB — MAGNESIUM
Magnesium: 2.2 mg/dL (ref 1.5–2.5)
Magnesium: 2.1 mg/dL (ref 1.5–2.5)

## 2014-03-31 LAB — PHOSPHORUS: Phosphorus: 4.1 mg/dL (ref 2.3–4.6)

## 2014-03-31 MED ORDER — SODIUM CHLORIDE 0.9 % IV SOLN
INTRAVENOUS | Status: DC
  Administered 2014-03-31 – 2014-04-01 (×2): via INTRAVENOUS
  Filled 2014-03-31 (×3): qty 1000

## 2014-03-31 MED ORDER — POTASSIUM CHLORIDE 10 MEQ/100ML IV SOLN: 10.0000 meq | INTRAVENOUS | Status: DC

## 2014-03-31 MED ORDER — TRACE MINERALS CR-CU-F-FE-I-MN-MO-SE-ZN IV SOLN
INTRAVENOUS | Status: AC
  Administered 2014-03-31: 17:00:00 via INTRAVENOUS
  Filled 2014-03-31: qty 1000

## 2014-03-31 MED ORDER — POTASSIUM CHLORIDE 10 MEQ/100ML IV SOLN
10.0000 meq | INTRAVENOUS | Status: DC
  Filled 2014-03-31 (×7): qty 100

## 2014-03-31 MED ORDER — FAT EMULSION 20 % IV EMUL
250.0000 mL | INTRAVENOUS | Status: AC
  Administered 2014-03-31: 250 mL via INTRAVENOUS
  Filled 2014-03-31: qty 250

## 2014-03-31 MED ORDER — POTASSIUM CHLORIDE 10 MEQ/100ML IV SOLN
10.0000 meq | INTRAVENOUS | Status: AC
  Administered 2014-03-31 (×6): 10 meq via INTRAVENOUS
  Filled 2014-03-31 (×6): qty 100

## 2014-03-31 NOTE — Progress Notes (Signed)
PARENTERAL NUTRITION CONSULT NOTE - Follow up  Pharmacy Consult for TPN Indication: intolerance to enteral feeding  Allergies  Allergen Reactions  . Penicillins Rash    Patient Measurements: Height: 5' 4"  (162.6 cm) Weight: 208 lb 1.6 oz (94.394 kg) IBW/kg (Calculated) : 54.7  Vital Signs:   Intake/Output from previous day: 12/14 0701 - 12/15 0700 In: 3929.8 [P.O.:120; I.V.:2151.5; IV Piggyback:350; TPN:1308.3] Out: 725 [Urine:725] Intake/Output from this shift:    Labs:  Recent Labs  03/28/14 1445  03/29/14 1120 03/30/14 0400 03/31/14 0435  WBC  --   < > 3.4* 3.4* 4.1  HGB  --   < > 8.7* 8.2* 8.9*  HCT  --   < > 27.2* 25.2* 27.9*  PLT  --   < > 74* 63* 64*  APTT 36  --   --   --   --   INR 1.70*  --   --   --  1.29  < > = values in this interval not displayed.   Recent Labs  03/29/14 1120 03/30/14 0400 03/31/14 0435  NA 145 146 146  K 3.6* 3.5* 3.0*  CL 112 113* 113*  CO2 19 19 18*  GLUCOSE 77 167* 113*  BUN 67* 74* 80*  CREATININE 1.55* 1.63* 1.55*  CALCIUM 7.8* 8.0* 7.7*  MG 2.3 2.3 2.1  2.2  PHOS 4.7* 5.0* 4.1  PROT 5.3* 5.1* 5.2*  ALBUMIN 2.4* 2.4* 2.3*  AST 35 25 26  ALT <5 <5 <5  ALKPHOS 309* 245* 260*  BILITOT 1.3* 0.9 0.9  PREALBUMIN  --  11.5*  --   TRIG  --  65  --    Estimated Creatinine Clearance: 43 mL/min (by C-G formula based on Cr of 1.55).    Recent Labs  03/31/14 0012 03/31/14 0408 03/31/14 0740  GLUCAP 120* 110* 123*    Medical History: Past Medical History  Diagnosis Date  . Hypertension   . Diabetes mellitus without complication   . Arthritis   . Coronary artery disease     pt unaware    Medications:  Scheduled:  . antiseptic oral rinse  7 mL Mouth Rinse q12n4p  . atorvastatin  20 mg Oral q1800  . bisacodyl  10 mg Rectal Once  .  ceFAZolin (ANCEF) IV  2 g Intravenous Q8H  . chlorhexidine  15 mL Mouth Rinse BID  . feeding supplement (ENSURE COMPLETE)  237 mL Oral TID BM  . fentaNYL  12.5 mcg  Transdermal Q72H  . fludrocortisone  0.1 mg Oral Daily  . hydrocortisone cream   Topical BID  . insulin aspart  0-15 Units Subcutaneous 6 times per day  . insulin glargine  12 Units Subcutaneous Daily  . methylPREDNISolone (SOLU-MEDROL) injection  40 mg Intravenous Daily  . mirtazapine  15 mg Oral QHS  . OLANZapine zydis  5 mg Oral QHS  . pantoprazole (PROTONIX) IV  40 mg Intravenous Q12H  . phytonadione (VITAMIN K) IV  10 mg Intravenous q morning - 10a  . potassium chloride  10 mEq Intravenous Q1 Hr x 6  . sodium bicarbonate  650 mg Oral TID  . sodium chloride  10-40 mL Intracatheter Q12H   Infusions:  . TPN (CLINIMIX) Adult without lytes 50 mL/hr at 03/30/14 1834   And  . fat emulsion 250 mL (03/30/14 1834)  . 0.9 % sodium chloride with kcl 50 mL/hr at 03/31/14 0812   PRN: acetaminophen **OR** acetaminophen, chlorpheniramine-HYDROcodone, HYDROmorphone (DILAUDID) injection, ondansetron **OR** ondansetron (ZOFRAN) IV,  sodium chloride   Insulin Requirements:   Lantus 12 units daily (started 12/12, previously 15 units daily 12/7-12/11)  Sensitive SSI: 8 units since TPN initiation  Current Nutrition:  Ensure supplement TID, has been refusing, did take 2 yesterday  IVF:  NS @ 75 ml/hr  Central access: PICC line placed 12/8 TPN start date: 12/13  ASSESSMENT                                                                                                          85 yoF admitted on 12/3 with fever, confusion, refusing care/PO intake, septic shock with recurrent MSSA bacteremia.  PICC line placed 12/8 for prolonged IV antibiotics.  She has severe deconditioning and severe protein calorie malnutrition; she has failed to advance tube feeds to goal and continues to complain of N/V with enteral intake.  MD to discuss PEG tube placement for long-term management, but may not be available until next week.  She has been started on steroids, olanzapine, mirtazapine, and prn anti-emetics to help  with nausea and anorexia.  Pharmacy is consulted to dose TPN.  She is high risk for refeeding.  Significant events:  12/8 Pt refusing PO.  Pharmacy consulted for TPN, but NOT started d/t contraindication w/ functioning gut.  Pt and husband agree to try enteral feeding.   12/10 Glucerna tolerated at 20 ml/hr but had N/V when advanced to 30 ml/hr. 12/11 TF advanced to 40 ml/hr overnight, but pt had large volume emesis.  TF held, pt refusing all PO intake. 12/13 TPN initiated  Today, 03/31/2014:  Glucose - CBGs elevated since TPN initiation.  Currently on steroids, which can also elevate CBGs. Receiving Lantus and SSI.  Electrolytes - Phos 4.1 (was 5), K low at 3.0, Mag WNL, CorrCa WNL at 9.06.    NaBicarb tabs ordered TID, refusing most doses-took 2 doses yesterday  Renal - SCr 1.55, elevated   LFTs - AST/ALT WNL, Tbili now WNL, Alk phos elevated  TGs - 65   Prealbumin: 11.5 (12/14) improved  Patient took oral medications this AM but refused the Ensure pudding. Discussed with Dr. Avie Echevaria is to continue TPN for now.  NUTRITIONAL GOALS                                                                                              RD recs (12/11): 1800-2000 kCal, 100-110 grams of protein per day  Clinimix 5/15 at a goal rate of 83 ml/hr + 20% fat emulsion at 10 ml/hr to provide: 100 g/day protein, 1894 Kcal/day.  PLAN  At 1800 today:  Continue Clinimix 5/15 formulation to WITHOUT electrolytes due to previous elevated Phos.  Decrease rate to 47m/hour in anticipation of patient going to residential hospice tomorrow.  20% fat emulsion at 10 ml/hr.  Plan not to advance due to potential discharge tomorrow.  TPN to contain standard multivitamins and trace elements.  IVF rate per MD.  Continue Lantus 12 units daily.  Change SSI to moderate scale with continued  blood sugar checks q4h.  TPN lab panels on Mondays & Thursdays.  KCl 10 mEq IV x 6 runs for hypokalemia.  F/u daily , continue to watch for refeeding syndrome.   EDolly RiasRPh 03/31/2014, 10:31 AM Pager [phone]

## 2014-03-31 NOTE — Clinical Social Work Note (Signed)
Spoke with husband via phone and he is agreeable to seeking a residential hospice setting for her. I have made referrals to [facility] as well as [facility] for consideration.  Plan to meet with husband later today when he visits- will update on disposition options as I hear back-  Reece Levy, MSW, [place] [facility] [phone]

## 2014-03-31 NOTE — Progress Notes (Signed)
Pt was lying in bed and awake when I arrived. Her iv was beeping and when asked she said she just wanted it to stop. Retrieved pt's nurse who took care of iv. Pt asked for prayer. Following prayer pt thanked me and nurse continued to care for her. Marjory Lies Chaplain   03/31/14 1000  Clinical Encounter Type  Visited With Patient

## 2014-03-31 NOTE — Progress Notes (Addendum)
PROGRESS NOTE    ARTELIA GAME NTI:[id number] DOB: December 18, 1955 DOA: 03/19/2014 PCP: No PCP Per Patient  HPI/Brief narrative 58 year old female patient with history of hypertension, obesity, DM, chronic diastolic CHF, possible rheumatoid arthritis, recent hospitalization for MSSA bacteremia of unknown source/neg TEE- cleared after Vancomycin (PCN allergy), went to SNF and then discharged from their to home, presented with complaints of not eating or drinking 4 days and husband was concerned that patient was trying to hurt herself and she was IVC and brought to ED where she was assessed as sepsis of unclear source and admitted to stepdown for further management. Patient went into septic shock requiring pressors. She also developed STEMI but poor overall candidate for aggressive intervention i.e. Currently. CCM, ID and cardiology assisting with care. Shock resolved- pressors DC'ed 12/7 AM.   Assessment/Plan:  1. Septic shock secondary to MSSA bacteremia: Continue cefazolin. Source not clear-? Status post arthrocentesis. Cell count from B/L knee synovial fluid consistent with inflammatory arthritis, culture pending. Infectious disease follow-up appreciated. . Shock improved/resolved and DC'd pressors 12/7 AM. She has been off IV hydrocortisone . Currently on prednisone,  Fludrocortisone. As per ID, will eventually need repeat TEE when medically stable. Obtain MRI of left knee,foot ,ankle , attempt to do this under no sedation today.if not tolerated then Needs conscious sedation,. Surveillance blood cultures 2 (12/4): Negative to date.  PICC line  placed after ID  Approval.  Stop date would be 04/21/14.   2. ? Osteomyelitis of medial left knee on x-ray: ID follow-up appreciated. MRI of left knee when stable. Patient has not been stable enough to have this done .   3. Left lower lobe airspace disease: Possibly chronic.? Doubt that the patient has pneumonia.    4.  hyponatremia: Resolved.     5. Inferiolateral wall STEMI: Patient has not complained of chest pain since admission. Troponins steadily increased to >20 on 12/3. Not candidate for aggressive intervention i.e. cath secondary to unstable status from shock, anemia and thrombocytopenia. Patient treated with 48 hours of IV heparin-DC'd 12/7 and was placed on aspirin-but DC due to bleeding . Will eventually need cardiac catheterization when stable. 2-D echo shows EF of 50-55% the distal inferior and apical hypokinesis that is new.. .  Cards will clear her for MRI as conscious sedation is needed, also determine timing of TEE .   6. IVC/?? Intention to self harm: IVC removed . Psychiatry consultation note reviewed . Discussed with Dr. Harrington Challenger 12/5. Patient has capacity to make her own medical decisions and living arrangement. Meds adjusted by psyche . No evidence of imminent risk to self. Sitter discontinued. Patient does have capacity for psych evaluation.   7. Anasarca: Secondary to ongoing illnesses, malnutrition and hypo-albuminemia.worsening , Treat underlying cause and attempt to improve nutritional status. Tube feeding held  because of nausea. Intolerant to tube feeding. Continue TPN until patient is discharged   8. Right sacral decubitus: Does not appear grossly infected. Wound care consultation requested.  9. Hypertension: Patient in septic shock. Shock is improving.  10. Type II DM: Uncontrolled. Likely worsened by steroids. Continue  Lantus-  continue SSI.   11. New diagnosis of rheumatoid arthritis: Patient was on prednisone at home. Patient placed on 20 mg of prednisone. Unable to tolerate by mouth medicines therefore changed to IV Solu-Medrol minimum dose of 40 mg. RA related pain was probably the reason she had been bed bound since recent DC from SNF. Patient will have OP Rheumatology consultation . In the meantime pain not  controlled. Will start the patient on a low-dose fentanyl patch to see if it helps.   12. Chronic diastolic CHF: Generalized anasarca but probably intravascularly dehydrated due to third spacing.  13. Hypokalemia: Replaced.   14. Anemia: Acute on chronic. Vaginal bleeding , splenomegaly on CT scan. ,  Baseline hemoglobin probably in the 9 g per DL range. Status post 1 unit PRBC 12/4 with appropriate response. Hemoglobin trending down, now with vaginal bleeding ,obtain stat CBC , type and screen, DC asa and lovenox, continue step down for higher nursing needs , hemoglobin stable. Appreciate hematology input.. Recommend platelet transfusion and the patient is actively bleeding. INR 1.7 probably secondary to poor oral intake. Administer vitamin K to see if the INR improves.    15. Thrombocytopenia: Possibly from acute illness/sepsis. 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 No suspicion for myelodysplasia. No indication for bone marrow biopsy. May have some underlying platelet dysfunction contribution to the bleeding. If profoundly bleeding please transfuse platelets.   16. Multiple pressure wounds: Details as per [facility] nurse note on 12/4. Patient has left heel DTI, right arm full-thickness abrasion and unstageable right buttock wound. Reconsult wound care because of bleeding sacral wounds.   17. Failure to thrive: Multifactorial   18. Intractable nausea. Patient started on Protonix IV, discontinued prednisone by mouth, unable to tolerate tube feeding, discontinue Reglan     19. Severe deconditioning: Severe protein calorie malnutrition: Dietitian consulted. Did not tolerate enteral feeding , now on TPN. Discussed PEG tube placement with the husband. Do not anticipate that this will be done up until  after   cardiology has cleared patient for conscious sedation   20. Depressive disorder secondary to general medical condition: Management per psychiatry. zyprexa  added. I doubt that  psychiatry has anything further to offer   21. oliguria renal failure-DC albumin,  IV fluids. Per nephrology. Appreciate nephrology input : Likely secondary to third spacing of fluids in the setting of severe protein calorie malnutrition and intravascular dehydration. . Creatinine stable for 3 days   22. E Coli UTI: Continue Ancef- sensitive.   23. Mild non-anion gap metabolic acidosis: Unclear etiology. Start by mouth bicarbonate. Follow BMP in a.m.  DVT: unable to tolerate locenox, scd's ,asa  Code Status: Limited code Family Communication:  Husband present by the bedside Disposition Plan: Long discussion with the husband, strongly recommend residential hospice Patient does not want continued hospitalization and psychiatry (see note from 12/9) and myself feel that the patient has capacity to make her own decisions   Consultants:  Cardiology  ID  Psychiatry  PCCM-Signed off  Procedures:  Arterial line-discontinued  Foley catheter  Antibiotics:  IV Vanc 12/3>DC'd   IV Aztreonam 12/3> 12/4   IV Ancef.  Subjective: Patient awake alert oriented, appears to have capacity and requesting her husband "to get her out of the hospital", enough is enough    Objective: Filed Vitals:   03/30/14 1200 03/30/14 1400 03/30/14 1600 03/30/14 2035  BP: 133/54 121/51  123/60  Pulse:  119  Temp: 98 F (36.7 C)  98.4 F (36.9 C) 97.4 F (36.3 C)  TempSrc: Oral  Oral Axillary  Resp: 19 19  16   Height:    5' 4"  (1.626 m)  Weight:    94.394 kg (208 lb 1.6 oz)  SpO2: 94% 93%  96%   blood pressures actually better than listed bowel-as discussed with nursing systolic in the 169I and map 73   Intake/Output Summary (Last 24 hours) at 03/31/14 1144 Last data filed at 03/31/14 0556  Gross per 24 hour  Intake 3879.84 ml  Output    725 ml  Net 3154.84 ml   Filed Weights   03/29/14 0400 03/30/14 0300 03/30/14 2035  Weight: 90.3 kg (199 lb 1.2 oz) 91.6 kg (201 lb 15.1 oz) 94.394  kg (208 lb 1.6 oz)     Exam:  General exam: Pleasant middle-aged female, extremely dry oral mucosa, generalized anasarca Respiratory system: Slightly diminished breath sounds in the bases but otherwise clear to auscultation. No increased work of breathing. Cardiovascular system: S1 & S2 heard, RRR. No JVD, murmurs, gallops, clicks. Anasarca. Telemetry: SB in the 50s-SR. Gastrointestinal system: Abdomen is nondistended, soft and nontender. Normal bowel sounds heard. Patient has patchy ecchymosis over right upper quadrant. Central nervous system: Alert and oriented 3. No focal neurological deficits. Extremities: Symmetric 5 x 5 power. Diffuse anasarca Skin: Dry appearing right sacral decubitus without signs of overt infection. Patient has skin tear over the right posterior upper arm. Left heel DTI without signs of acute infection. Psychiatry: Pleasant and comfortable today. Denies suicidal ideations.   Data Reviewed: Basic Metabolic Panel:  Recent Labs Lab 03/26/14 0421 03/27/14 0450 03/28/14 0430 03/29/14 1120 03/30/14 0400 03/31/14 0435  NA 138 141 143 145 146 146  K 3.5* 4.1 5.2 3.6* 3.5* 3.0*  CL 106 108 111 112 113* 113*  CO2 20 20 19 19 19  18*  GLUCOSE 103* 74 64* 77 167* 113*  BUN 52* 55* 59* 67* 74* 80*  CREATININE 1.26* 1.27* 1.33* 1.55* 1.63* 1.55*  CALCIUM 7.5* 7.4* 7.1* 7.8* 8.0* 7.7*  MG 2.2  --   --  2.3 2.3 2.1  2.2  PHOS 4.3  --   --  4.7* 5.0* 4.1   Liver Function Tests:  Recent Labs Lab 03/26/14 0421 03/28/14 0430 03/29/14 1120 03/30/14 0400 03/31/14 0435  AST 52* 46* 35 25 26  ALT <5 <5 <5 <5 <5  ALKPHOS 426* 392* 309* 245* 260*  BILITOT 0.8 1.1 1.3* 0.9 0.9  PROT 5.0* 4.8* 5.3* 5.1* 5.2*  ALBUMIN 1.3* 1.6* 2.4* 2.4* 2.3*   No results for input(s): LIPASE, AMYLASE in the last 168 hours. No results for input(s): AMMONIA in the last 168 hours. CBC:  Recent Labs Lab 03/28/14 0430 03/29/14 0420 03/29/14 1120 03/30/14 0400 03/31/14 0435   WBC 3.3* 3.8* 3.4* 3.4* 4.1  NEUTROABS  --   --   --  2.9  --   HGB 7.0* 8.9* 8.7* 8.2* 8.9*  HCT 22.1* 27.3* 27.2* 25.2* 27.9*  MCV 87.4 87.8 88.0 89.0 90.3  PLT 79* 75* 74* 63* 64*   Cardiac Enzymes: No results for input(s): CKTOTAL, CKMB, CKMBINDEX, TROPONINI in the last 168 hours. BNP (last 3 results) No results for input(s): PROBNP in the last 8760 hours. CBG:  Recent Labs Lab 03/30/14 1750 03/30/14 2120 03/31/14 0012 03/31/14 0408 03/31/14 0740  GLUCAP 160* 137* 120* 110* 123*    Recent Results (from the past 240 hour(s))  Body  fluid culture     Status: None   Collection Time: 03/25/14  6:37 PM  Result Value Ref Range Status   Specimen Description SYNOVIAL RIGHT KNEE  Final   Special Requests NONE  Final   Gram Stain   Final    FEW WBC PRESENT,BOTH PMN AND MONONUCLEAR NO ORGANISMS SEEN Performed at [facility]    Culture   Final    NO GROWTH 3 DAYS Performed at [facility]    Report Status 03/29/2014 FINAL  Final  Body fluid culture     Status: None   Collection Time: 03/25/14  6:46 PM  Result Value Ref Range Status   Specimen Description SYNOVIAL LEFT KNEE  Final   Special Requests NONE  Final   Gram Stain   Final    ABUNDANT WBC PRESENT,BOTH PMN AND MONONUCLEAR NO ORGANISMS SEEN Performed at [facility]    Culture   Final    RARE STAPHYLOCOCCUS AUREUS Note: RIFAMPIN AND GENTAMICIN SHOULD NOT BE USED AS SINGLE DRUGS FOR TREATMENT OF STAPH INFECTIONS. CALLED TO KIMBERLY OKAFOR 03/30/14 1315 BY SMITHERSJ Performed at [facility]    Report Status 03/31/2014 FINAL  Final   Organism ID, Bacteria STAPHYLOCOCCUS AUREUS  Final      Susceptibility   Staphylococcus aureus - MIC*    CLINDAMYCIN <=0.25 SENSITIVE Sensitive     ERYTHROMYCIN <=0.25 SENSITIVE Sensitive     GENTAMICIN <=0.5 SENSITIVE Sensitive     LEVOFLOXACIN 0.25 SENSITIVE Sensitive     OXACILLIN 0.5 SENSITIVE Sensitive     PENICILLIN 0.12 SENSITIVE  Sensitive     RIFAMPIN <=0.5 SENSITIVE Sensitive     TRIMETH/SULFA <=10 SENSITIVE Sensitive     VANCOMYCIN <=0.5 SENSITIVE Sensitive     TETRACYCLINE <=1 SENSITIVE Sensitive     MOXIFLOXACIN <=0.25 SENSITIVE Sensitive     * RARE STAPHYLOCOCCUS AUREUS         Studies: No results found.      Scheduled Meds: . antiseptic oral rinse  7 mL Mouth Rinse q12n4p  . atorvastatin  20 mg Oral q1800  . bisacodyl  10 mg Rectal Once  .  ceFAZolin (ANCEF) IV  2 g Intravenous Q8H  . chlorhexidine  15 mL Mouth Rinse BID  . feeding supplement (ENSURE COMPLETE)  237 mL Oral TID BM  . fentaNYL  12.5 mcg Transdermal Q72H  . fludrocortisone  0.1 mg Oral Daily  . hydrocortisone cream   Topical BID  . insulin aspart  0-15 Units Subcutaneous 6 times per day  . insulin glargine  12 Units Subcutaneous Daily  . methylPREDNISolone (SOLU-MEDROL) injection  40 mg Intravenous Daily  . mirtazapine  15 mg Oral QHS  . OLANZapine zydis  5 mg Oral QHS  . pantoprazole (PROTONIX) IV  40 mg Intravenous Q12H  . phytonadione (VITAMIN K) IV  10 mg Intravenous q morning - 10a  . potassium chloride  10 mEq Intravenous Q1 Hr x 6  . sodium bicarbonate  650 mg Oral TID  . sodium chloride  10-40 mL Intracatheter Q12H   Continuous Infusions: . TPN (CLINIMIX) Adult without lytes 50 mL/hr at 03/30/14 1834   And  . fat emulsion 250 mL (03/30/14 1834)  . TPN (CLINIMIX) Adult without lytes     And  . fat emulsion    . 0.9 % sodium chloride with kcl 50 mL/hr at 03/31/14 4332    Active Problems:   DM2 (diabetes mellitus, type 2)   Physical deconditioning  Hypoalbuminemia   Hyponatremia   Severe sepsis   Acute UTI   Protein-calorie malnutrition, severe   Sacral decubitus ulcer   Acute pulmonary edema   Effusion into joint   ST elevation myocardial infarction (STEMI) of inferolateral wall, initial episode of care   Severe sepsis with septic shock   Diabetic ulcer of left foot associated with diabetes mellitus  due to underlying condition   Osteomyelitis   Ulcer of heel   Acute osteomyelitis of femur   Septic arthritis of knee, left   STEMI (ST elevation myocardial infarction)   Ulcer of left heel   Metabolic acidosis   Bacteremia   Knee pain   Ileus   Nausea with vomiting   Palliative care encounter   Appetite loss    Time spent: 40 minutes.    Reyne Dumas, MD [facility] Pager [phone]  If 7PM-7AM, please contact night-coverage [email or web address] Password [email or web address] 03/31/2014, 11:44 AM    LOS: 12 days

## 2014-03-31 NOTE — Progress Notes (Signed)
[facility]    Date of Admission:  03/19/2014   Total days of antibiotics 13        Day 11 cefazolin           ID: Robin Schroeder is a 58 y.o. female with recurrent MSSA bacteremia from LEFT SEPTIC ARTHRITIS c/b inferior STEMI, AKI, deconditioned/bed bound, severe RA, depression, and anasarca from severe protein caloric malnutrition Active Problems:   DM2 (diabetes mellitus, type 2)   Physical deconditioning   Hypoalbuminemia   Hyponatremia   Severe sepsis   Acute UTI   Protein-calorie malnutrition, severe   Sacral decubitus ulcer   Acute pulmonary edema   Effusion into joint   ST elevation myocardial infarction (STEMI) of inferolateral wall, initial episode of care   Severe sepsis with septic shock   Diabetic ulcer of left foot associated with diabetes mellitus due to underlying condition   Osteomyelitis   Ulcer of heel   Acute osteomyelitis of femur   Septic arthritis of knee, left   STEMI (ST elevation myocardial infarction)   Ulcer of left heel   Metabolic acidosis   Bacteremia   Knee pain   Ileus   Nausea with vomiting   Palliative care encounter   Appetite loss    Subjective: Afebrile,    Medications:  . antiseptic oral rinse  7 mL Mouth Rinse q12n4p  . atorvastatin  20 mg Oral q1800  . bisacodyl  10 mg Rectal Once  .  ceFAZolin (ANCEF) IV  2 g Intravenous Q8H  . chlorhexidine  15 mL Mouth Rinse BID  . feeding supplement (ENSURE COMPLETE)  237 mL Oral TID BM  . fentaNYL  12.5 mcg Transdermal Q72H  . fludrocortisone  0.1 mg Oral Daily  . hydrocortisone cream   Topical BID  . insulin aspart  0-15 Units Subcutaneous 6 times per day  . insulin glargine  12 Units Subcutaneous Daily  . methylPREDNISolone (SOLU-MEDROL) injection  40 mg Intravenous Daily  . mirtazapine  15 mg Oral QHS  . OLANZapine zydis  5 mg Oral QHS  . pantoprazole (PROTONIX) IV  40 mg Intravenous Q12H  . phytonadione (VITAMIN K) IV  10 mg Intravenous q  morning - 10a  . sodium bicarbonate  650 mg Oral TID  . sodium chloride  10-40 mL Intracatheter Q12H    Objective: Vital signs in last 24 hours: Temp:  [97.4 F (36.3 C)-97.9 F (36.6 C)] 97.9 F (36.6 C) (12/15 1417) Pulse Rate:  [92-119] 92 (12/15 1417) Resp:  [16-17] 17 (12/15 1417) BP: (123-136)/(60-69) 136/69 mmHg (12/15 1417) SpO2:  [96 %-98 %] 98 % (12/15 1417) Weight:  [208 lb 1.6 oz (94.394 kg)] 208 lb 1.6 oz (94.394 kg) (12/14 2035) Physical Exam  Constitutional: . appears anasarcic, disheveled.  looks fatigue. Answering some questions today. Awakens to verbal stimuli Heent: dried blood in nares Cardiovascular: Normal rate, regular rhythm and normal heart sounds. Exam reveals no gallop and no friction rub.  No murmur heard.  Pulmonary/Chest: Effort normal and breath sounds normal. No respiratory distress.  has no wheezes.  Abdominal: mildlly distension. Decrease bowel sounds.There is no tenderness.  Skin:d heel ulcer to medial aspect of left leg. echymosis to right foot sole, decub to right calf Ext: diffusely anasarca are equal in L and R arm, more so that anasarca to lower extremities. Psychiatric: flat affect, not responding to questions  Lab Results  Recent Labs  03/30/14 0400 03/31/14 0435  WBC 3.4* 4.1  HGB 8.2* 8.9*  HCT 25.2* 27.9*  NA 146 146  K 3.5* 3.0*  CL 113* 113*  CO2 19 18*  BUN 74* 80*  CREATININE 1.63* 1.55*   Lab Results  Component Value Date   ESRSEDRATE 90* 03/21/2014   Lab Results  Component Value Date   CRP 16.1* 03/21/2014   Arthrocentesis =  40,000 cells 88%N and 11,050 95% N and no cells. Cloudy with rBC Microbiology: 12/3 blood cx x 2 MSSA 12/4 blood cx NGTD 12/9 left knee arthrocentesis = Staph aureus 12/9 right knee arthrocentesis = NGTD  Studies/Results: No results found.  Assessment/Plan: Recurrent MSSA bacteremia likely associated with left knee septic arthritis. Unclear if other sources since she has numerous  pressure ulcers, unable to mobilize due to deconditioning and polyarthritis from untreated rheumatoid arthritis,(newly diagnosed in Oct 2015) severe protein caloric malnutrition, and oligouric ATN AKI. Patient and family having discussion with palliative care. Plan is unchanged from ID standpoint  MSSA bacteremia = recurrent bacteremia maybe due to left knee septic arthritis (since cx grew MSSA).  We will  treat for extended course with 6 wk with cefazolin 2gm IV q8hr for now using 12/9 as day 1 of 42.  MSSA septic arthritis = normally would have orthopedics wash out. Patient is in such poor deconditioned state, not sure what they would recommend. Would wait to consult ortho since patient will likely needs goals of care discussion. Recent STEMI, inferior infarct precludes her from any extensive sedation.  If she becomes clinically stable, may consider getting MRI of left foot to see if signs of osteomyelitis associate with heal decub.   aki with oligouria =  Currently getting blood tsf and albumin. Defer to renal fro further recs  Coagulopathy = getting vitamin k to minimize bleeding and possibly plt tsf today  Anasarca  2/2 protein caloric malnutrition= currently on TPN. Awaiting to address goals of care to see if pursuing PEG would be better mode of treating malnutrition. Challenging in many regards due to diffuse anasarca as well as thrombocytopenia  Pressure ulcer = continue with local wound care  Newly diagnosed Rheumatoid Arthritis = continue on prednisone 20 mg daily to see if she has temporary relief of joint pain, possibly increased appetite. I suspect her immobility is in part due other polyarthritis from RA.   - recommend to get case management started on medicaid app so that she may have resources and access to care when she gets discharged  ACS/inferior STEMI= in setting of sepsis from MSSA. Cards recommending low dose asa and low dose metop for management.   Depression =    Appreciated Dr. Shela Commons input on management    Lance [place], [facility] Cell: [phone] Pager: [phone]  03/31/2014, 4:58 PM

## 2014-03-31 NOTE — Progress Notes (Signed)
[facility] Progress Note   Subjective: denies pain  Filed Vitals:   03/30/14 1200 03/30/14 1400 03/30/14 1600 03/30/14 2035  BP: 133/54 121/51  123/60  Pulse:    119  Temp: 98 F (36.7 C)  98.4 F (36.9 C) 97.4 F (36.3 C)  TempSrc: Oral  Oral Axillary  Resp: 19 19  16   Height:    5\' 4"  (1.626 m)  Weight:    94.394 kg (208 lb 1.6 oz)  SpO2: 94% 93%  96%   Exam: Ill appearing, somnolent, arouses No jvd Chest is clear bilat RRR no MRG Abd obese, diffuse petechiae, nontender, no HSM or ascites Bilat diffuse edema UE's and LE's 2+ Neuro is diffusely weak, oriented x 3, requires staff to help with eating/drinking  UA (lab) 12/11 - large LE, large Hb, 100 prot, 21-50 wbc, 11-20 rbc, rare epi, few bact, hyaline casts UA 12/14 (undersigned) > diffuse granular casts, 50 wbc / hpf, numerous budding yeast, RBC's 3-6 per hpf UPC ratio 3.71       Assessment/Rec: 1. AKI - nonoliguric, creat stable x 3 days. Sediment c/w ATN, due to hypotension in this case which has resolved.  In recovery phase from AKI.  Poor candidate for RRT.  No other suggestions at this time, will sign off. Please call as needed.  2. MSSA bacteremia/ L knee septic arthritis - on abx 3. EColi UTI 4. Severe malnutrition / hypoalbuminemia - on TNA 5. Hx of rheumatoid arthritis, was steroid-dependent on admission 6. Hx MSSA sepsis Oct 2015 - TEE was negative 7. 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 Acute MI / inferior wall by ECHO / +trop peak > 20    1/15 MD  pager [phone]    cell [phone]  03/31/2014, 2:08 PM     Recent Labs Lab 03/29/14 1120 03/30/14 0400 03/31/14 0435  NA 145 146 146  K 3.6* 3.5* 3.0*  CL 112 113* 113*  CO2 19 19 18*  GLUCOSE 77 167* 113*  BUN 67* 74* 80*  CREATININE 1.55* 1.63* 1.55*  CALCIUM 7.8* 8.0* 7.7*  PHOS 4.7* 5.0* 4.1    Recent Labs Lab 03/29/14 1120 03/30/14 0400 03/31/14 0435  AST 35 25 26  ALT <5 <5 <5  ALKPHOS  309* 245* 260*  BILITOT 1.3* 0.9 0.9  PROT 5.3* 5.1* 5.2*  ALBUMIN 2.4* 2.4* 2.3*    Recent Labs Lab 03/29/14 1120 03/30/14 0400 03/31/14 0435  WBC 3.4* 3.4* 4.1  NEUTROABS  --  2.9  --   HGB 8.7* 8.2* 8.9*  HCT 27.2* 25.2* 27.9*  MCV 88.0 89.0 90.3  PLT 74* 63* 64*   . antiseptic oral rinse  7 mL Mouth Rinse q12n4p  . atorvastatin  20 mg Oral q1800  . bisacodyl  10 mg Rectal Once  .  ceFAZolin (ANCEF) IV  2 g Intravenous Q8H  . chlorhexidine  15 mL Mouth Rinse BID  . feeding supplement (ENSURE COMPLETE)  237 mL Oral TID BM  . fentaNYL  12.5 mcg Transdermal Q72H  . fludrocortisone  0.1 mg Oral Daily  . hydrocortisone cream   Topical BID  . insulin aspart  0-15 Units Subcutaneous 6 times per day  . insulin glargine  12 Units Subcutaneous Daily  . methylPREDNISolone (SOLU-MEDROL) injection  40 mg Intravenous Daily  . mirtazapine  15 mg Oral QHS  . OLANZapine zydis  5 mg Oral QHS  . pantoprazole (PROTONIX) IV  40 mg Intravenous Q12H  .  phytonadione (VITAMIN K) IV  10 mg Intravenous q morning - 10a  . potassium chloride  10 mEq Intravenous Q1 Hr x 6  . sodium bicarbonate  650 mg Oral TID  . sodium chloride  10-40 mL Intracatheter Q12H   . TPN (CLINIMIX) Adult without lytes 50 mL/hr at 03/30/14 1834   And  . fat emulsion 250 mL (03/30/14 1834)  . TPN (CLINIMIX) Adult without lytes     And  . fat emulsion    . 0.9 % sodium chloride with kcl 50 mL/hr at 03/31/14 0973   acetaminophen **OR** acetaminophen, chlorpheniramine-HYDROcodone, HYDROmorphone (DILAUDID) injection, ondansetron **OR** ondansetron (ZOFRAN) IV, sodium chloride

## 2014-03-31 NOTE — Progress Notes (Signed)
Patient AT:[facility] ELLENI Schroeder      DOB: 07-20-1955      GUR:[id number]   Palliative Medicine Team at [facility] Progress Note    Subjective: Robin Schroeder is more alert today. Talking some with husband. Tells me she feels bad but wont elaborate.  Asks about what is going on. Even gently stating that she has gotten so sick from multiple medical issues that I wish we had a quick fix for, leads to her becoming tearful and asking me to leave.      Filed Vitals:   03/30/14 2035  BP: 123/60  Pulse: 119  Resp: 16   Physical exam: GEN: more alert, tearful HEENT: [place], dry mm EXT: anasarca   CBC    Component Value Date/Time   WBC 4.1 03/31/2014 0435   WBC 5.1 05/09/2013 1233   RBC 3.09* 03/31/2014 0435   RBC 2.42* 03/28/2014 0825   RBC 4.84 05/09/2013 1233   HGB 8.9* 03/31/2014 0435   HGB 14.4 05/09/2013 1233   HCT 27.9* 03/31/2014 0435   HCT 47.4 05/09/2013 1233   PLT 64* 03/31/2014 0435   MCV 90.3 03/31/2014 0435   MCV 97.9* 05/09/2013 1233   MCH 28.8 03/31/2014 0435   MCH 29.8 05/09/2013 1233   MCHC 31.9 03/31/2014 0435   MCHC 30.4* 05/09/2013 1233   RDW 19.1* 03/31/2014 0435   LYMPHSABS 0.3* 03/30/2014 0400   MONOABS 0.2 03/30/2014 0400   EOSABS 0.0 03/30/2014 0400   BASOSABS 0.0 03/30/2014 0400    CMP     Component Value Date/Time   NA 146 03/31/2014 0435   K 3.0* 03/31/2014 0435   CL 113* 03/31/2014 0435   CO2 18* 03/31/2014 0435   GLUCOSE 113* 03/31/2014 0435   BUN 80* 03/31/2014 0435   CREATININE 1.55* 03/31/2014 0435   CREATININE 0.44* 02/12/2014 1232   CALCIUM 7.7* 03/31/2014 0435   PROT 5.2* 03/31/2014 0435   ALBUMIN 2.3* 03/31/2014 0435   AST 26 03/31/2014 0435   ALT <5 03/31/2014 0435   ALKPHOS 260* 03/31/2014 0435   BILITOT 0.9 03/31/2014 0435   GFRNONAA 35* 03/31/2014 0435   GFRNONAA >89 02/12/2014 1232   GFRAA 41* 03/31/2014 0435   GFRAA >89 02/12/2014 1232      Assessment and plan: 58 yo female with multiple medical problems as below who  presented with fevers, decreased PO intake. Found to have sepsis, STEMI, splenomegaly in hospital.   1. Code Status: Partial. No CPR/Intubation  2. Goals of Care:  See previous notes.   [name] is a bit more alert today, but withdraws from conversation even when I gently try to answer her question of "what is wrong".  She asked me to leave after very brief mention of having multiple issues that I wish we had easy fix for.  I spoke extensively with husband Lint again today.  He feels that because she is more interactive today that she is "fighting".  We talked about her multitude of issues still making recovery difficult and that she has high chance of further setbacks even leading to in-hospital death.  We also again talked about that if she does survive her hospitalization, her long-term outcome looks poor given her refusal to participate in medical care, dislike of nursing home/hospital/staff, multiple medical issues as described. I told Robin Schroeder that it would be reasonable to switch focus on comfort care given this situation.  He will talk with Earleen's siblings. I have given my contact information if they have questions. I sense that  Longo is curious to see if her mentation continues to improve.  Emotionally difficult decision for him, but I think he agrees that even if she did a little better here, hard to see her doing remotely well in long-run.   I recommended that Roebrto follows her trajectory over next few days, talk with family, re-meet in 2-3 days to see how Robin Schroeder is doing and make decisions regarding care based on that.  With septic joint, I would have concerns about her ability to even tolerate mild surgical intervention.    3. Symptom Management:  1. Nausea- now on TPN.  I will d/c reglan.  Continue other PRN's.  On multiple meds that target nausea 2. Appetite Loss/FTT- She is already on steroids, zyprexa, remeron. Doubt addition of other agents would make significant  impact.  4. Psychosocial/Spiritual: Family requested chaplain support. I have placed consult. Few sisters in [place]. 1 sister and a brother in [place]. Married to Robin Schroeder for >15 years.    Total Time: 40 minutes  Greater than 50% of this time was spent counseling and coordinating care related to the above assessment and plan, extensive review of records.  Orvis Brill D.O. Palliative Medicine Team at [facility]  Pager: [phone] Team Phone: [phone]

## 2014-03-31 NOTE — Plan of Care (Signed)
Problem: Phase II Progression Outcomes Goal: Obtain order to discontinue catheter if appropriate Outcome: Not Met (add Reason) End of life  Problem: Phase III Progression Outcomes Goal: Voiding independently Outcome: Not Met (add Reason) End of life foley

## 2014-04-01 DIAGNOSIS — M00862 Arthritis due to other bacteria, left knee: Secondary | ICD-10-CM

## 2014-04-01 DIAGNOSIS — D689 Coagulation defect, unspecified: Secondary | ICD-10-CM

## 2014-04-01 DIAGNOSIS — M868X6 Other osteomyelitis, lower leg: Secondary | ICD-10-CM

## 2014-04-01 DIAGNOSIS — R34 Anuria and oliguria: Secondary | ICD-10-CM

## 2014-04-01 DIAGNOSIS — Z7401 Bed confinement status: Secondary | ICD-10-CM

## 2014-04-01 LAB — GLUCOSE, CAPILLARY
GLUCOSE-CAPILLARY: 154 mg/dL — AB (ref 70–99)
GLUCOSE-CAPILLARY: 228 mg/dL — AB (ref 70–99)
Glucose-Capillary: 159 mg/dL — ABNORMAL HIGH (ref 70–99)
Glucose-Capillary: 172 mg/dL — ABNORMAL HIGH (ref 70–99)
Glucose-Capillary: 223 mg/dL — ABNORMAL HIGH (ref 70–99)
Glucose-Capillary: 247 mg/dL — ABNORMAL HIGH (ref 70–99)

## 2014-04-01 LAB — COMPREHENSIVE METABOLIC PANEL
AST: 26 U/L (ref 0–37)
Albumin: 2.1 g/dL — ABNORMAL LOW (ref 3.5–5.2)
Alkaline Phosphatase: 264 U/L — ABNORMAL HIGH (ref 39–117)
Anion gap: 13 (ref 5–15)
BILIRUBIN TOTAL: 0.8 mg/dL (ref 0.3–1.2)
BUN: 82 mg/dL — ABNORMAL HIGH (ref 6–23)
CHLORIDE: 112 meq/L (ref 96–112)
CO2: 18 meq/L — AB (ref 19–32)
CREATININE: 1.38 mg/dL — AB (ref 0.50–1.10)
Calcium: 7.6 mg/dL — ABNORMAL LOW (ref 8.4–10.5)
GFR calc Af Amer: 47 mL/min — ABNORMAL LOW (ref >90)
GFR, EST NON AFRICAN AMERICAN: 41 mL/min — AB (ref >90)
Glucose, Bld: 197 mg/dL — ABNORMAL HIGH (ref 70–99)
Potassium: 3.4 mEq/L — ABNORMAL LOW (ref 3.7–5.3)
SODIUM: 143 meq/L (ref 137–147)
Total Protein: 5.2 g/dL — ABNORMAL LOW (ref 6.0–8.3)

## 2014-04-01 LAB — CBC
HCT: 26.5 % — ABNORMAL LOW (ref 36.0–46.0)
HEMOGLOBIN: 8.5 g/dL — AB (ref 12.0–15.0)
MCH: 29.2 pg (ref 26.0–34.0)
MCHC: 32.1 g/dL (ref 30.0–36.0)
MCV: 91.1 fL (ref 78.0–100.0)
Platelets: 51 10*3/uL — ABNORMAL LOW (ref 150–400)
RBC: 2.91 MIL/uL — AB (ref 3.87–5.11)
RDW: 19.2 % — ABNORMAL HIGH (ref 11.5–15.5)
WBC: 4 10*3/uL (ref 4.0–10.5)

## 2014-04-01 MED ORDER — VITAMIN K1 10 MG/ML IJ SOLN
10.0000 mg | Freq: Every morning | INTRAVENOUS | Status: AC
  Administered 2014-04-01 – 2014-04-02 (×2): 10 mg via INTRAVENOUS
  Filled 2014-04-01 (×2): qty 1

## 2014-04-01 MED ORDER — FENTANYL 25 MCG/HR TD PT72
25.0000 ug | MEDICATED_PATCH | TRANSDERMAL | Status: DC
  Administered 2014-04-01: 25 ug via TRANSDERMAL
  Filled 2014-04-01: qty 1

## 2014-04-01 MED ORDER — FLUCONAZOLE IN SODIUM CHLORIDE 200-0.9 MG/100ML-% IV SOLN
200.0000 mg | INTRAVENOUS | Status: DC
  Administered 2014-04-01 – 2014-04-05 (×5): 200 mg via INTRAVENOUS
  Filled 2014-04-01 (×5): qty 100

## 2014-04-01 MED ORDER — FAT EMULSION 20 % IV EMUL
250.0000 mL | INTRAVENOUS | Status: AC
  Administered 2014-04-01: 250 mL via INTRAVENOUS
  Filled 2014-04-01: qty 250

## 2014-04-01 MED ORDER — MAGIC MOUTHWASH
10.0000 mL | Freq: Three times a day (TID) | ORAL | Status: DC
  Administered 2014-04-01: 10 mL via ORAL
  Filled 2014-04-01 (×5): qty 10

## 2014-04-01 MED ORDER — METOPROLOL TARTRATE 25 MG PO TABS
12.5000 mg | ORAL_TABLET | Freq: Every morning | ORAL | Status: DC
  Administered 2014-04-04 – 2014-04-06 (×2): 12.5 mg via ORAL
  Filled 2014-04-01 (×4): qty 1

## 2014-04-01 MED ORDER — SODIUM CHLORIDE 0.9 % IV SOLN
INTRAVENOUS | Status: AC
  Administered 2014-04-01: 18:00:00 via INTRAVENOUS
  Filled 2014-04-01 (×3): qty 1000

## 2014-04-01 MED ORDER — TRACE MINERALS CR-CU-F-FE-I-MN-MO-SE-ZN IV SOLN
INTRAVENOUS | Status: AC
  Administered 2014-04-01: 18:00:00 via INTRAVENOUS
  Filled 2014-04-01: qty 1000

## 2014-04-01 NOTE — Progress Notes (Addendum)
PROGRESS NOTE    Robin Schroeder FSF:[id number] DOB: [birth year] DOA: 03/19/2014 PCP: No PCP Per Patient  HPI/Brief narrative 58 year old female patient with history of hypertension, obesity, DM, chronic diastolic CHF, possible rheumatoid arthritis, recent hospitalization for MSSA bacteremia of unknown source/neg TEE- cleared after Vancomycin (PCN allergy), went to SNF and then discharged from their to home, presented with complaints of not eating or drinking 4 days and husband was concerned that patient was trying to hurt herself and she was IVC and brought to ED where she was assessed as sepsis of unclear source and admitted to stepdown for further management. Patient went into septic shock requiring pressors. She also developed STEMI but poor overall candidate for aggressive intervention i.e. Currently. CCM, ID and cardiology assisting with care. Shock resolved- pressors DC'ed 12/7 AM.   Assessment/Plan:  1. Septic shock secondary to MSSA bacteremia: Continue cefazolin. Source not clear-? Status post arthrocentesis. Cell count from B/L knee synovial fluid consistent with inflammatory arthritis, culture shows MSSA from the left knee. Infectious disease follow-up appreciated. . Shock improved/resolved and DC'd pressors 12/7 AM. She has been off IV hydrocortisone . Currently on prednisone,  Fludrocortisone. As per ID, will eventually need repeat TEE when medically stable. Obtain MRI of left knee,foot ,ankle , attempt to do this under no sedation today.if not tolerated then Needs conscious sedation,. Surveillance blood cultures 2 (12/4): Negative to date.  PICC line  placed after ID  Approval.  Stop date would be 05/06/14. Increase fentanyl patch today because of uncontrolled pain   2. ? Osteomyelitis of medial left knee on x-ray: ID follow-up appreciated. MRI of left knee when stable. Patient has not been stable enough to have this done .   3. Left lower lobe airspace disease: Possibly chronic.?  Doubt that the patient has pneumonia.    4.  hyponatremia: Resolved.    5. Inferiolateral wall STEMI: Patient has not complained of chest pain since admission. Troponins steadily increased to >20 on 12/3. Not candidate for aggressive intervention i.e. cath secondary to unstable status from shock, anemia and thrombocytopenia. Patient treated with 48 hours of IV heparin-DC'd 12/7 and was placed on aspirin-but DC due to bleeding . Will eventually need cardiac catheterization when stable. 2-D echo shows EF of 50-55% the distal inferior and apical hypokinesis that is new.. .  Cards will clear her for MRI as conscious sedation is needed, also determine timing of TEE .   6. IVC/?? Intention to self harm: IVC removed . Psychiatry consultation note reviewed . Discussed with Dr. Harrington Challenger 12/5. Patient has capacity to make her own medical decisions and living arrangement. Meds adjusted by psyche . No evidence of imminent risk to self. Sitter discontinued. Patient does have capacity for psych evaluation.   7. Anasarca: Secondary to ongoing illnesses, malnutrition and hypo-albuminemia.worsening , Treat underlying cause and attempt to improve nutritional status. Tube feeding held  because of nausea. Intolerant to tube feeding. Continue TPN until patient is discharged   8. Right sacral decubitus: Does not appear grossly infected. Wound care consultation requested.  9. Hypertension: Patient in septic shock. Shock is improving.  10. Type II DM: Uncontrolled. Likely worsened by steroids. Continue  Lantus-  continue SSI.   11. New diagnosis of rheumatoid arthritis: Patient was on prednisone at home. Patient placed on 20 mg of prednisone. Unable to tolerate by mouth medicines therefore changed to IV Solu-Medrol minimum dose of 40 mg. RA related pain was probably the reason she had been bed bound since recent DC from SNF. Patient  will have OP Rheumatology consultation . In the meantime pain not controlled. Will start the patient on a low-dose fentanyl patch to see if it helps.   12. Chronic diastolic CHF: Generalized anasarca but probably intravascularly dehydrated due to third spacing.  13. Hypokalemia: Replaced.   14. Anemia: Acute on chronic. Vaginal bleeding , splenomegaly on CT scan. ,  Baseline hemoglobin probably in the 9 g per DL range. Status post 1 unit PRBC 12/4 with appropriate response. Hemoglobin trending down, now with vaginal bleeding ,obtain stat CBC , type and screen, DC asa and lovenox, hemoglobin stable. Appreciate hematology input.. Recommend platelet transfusion if the patient is actively bleeding. INR improving with vitamin K injections.   15. Thrombocytopenia: Possibly from acute illness/sepsis. 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 No suspicion for myelodysplasia. No indication for bone marrow biopsy. May have some underlying platelet dysfunction contribution to the bleeding. If profoundly bleeding please transfuse platelets.   16. Multiple pressure wounds: Details as per [place] nurse note on 12/4. Patient has left heel DTI, right arm full-thickness abrasion and unstageable right buttock wound. Reconsult wound care because of bleeding sacral wounds.   17. Failure to thrive: Multifactorial   18. Intractable nausea. Patient started on Protonix IV, discontinued prednisone by mouth, unable to tolerate tube feeding, discontinue Reglan     19. Severe deconditioning: Severe protein calorie malnutrition: Dietitian consulted. Did not tolerate enteral feeding , now on TPN. Discussed PEG tube placement with the husband. Do not anticipate that this will be done up until  after   cardiology has cleared patient for conscious sedation   20. Depressive disorder secondary to general medical condition: Management per psychiatry. zyprexa  added. I doubt that psychiatry has anything further to  offer   21. oliguria renal failure-DC albumin,  IV fluids. Per nephrology. Appreciate nephrology input : Likely secondary to third spacing of fluids in the setting of severe protein calorie malnutrition and intravascular dehydration. . Creatinine stable for 3 days   22. E Coli UTI: Continue Ancef- sensitive.   23. Mild non-anion gap metabolic acidosis: Unclear etiology. Start by mouth bicarbonate. Follow BMP in a.m.  DVT: unable to tolerate locenox, scd's ,asa  Code Status: Limited code Family Communication:  Husband present by the bedside   Disposition Plan: Long discussion with the husband, patient may be eligible for LTAC Case management investigated further   Patient does not want continued hospitalization and psychiatry (see note from 12/9) and myself feel that the patient has capacity to make her own decisions   Consultants:  Cardiology  ID  Psychiatry  PCCM-Signed off  Procedures:  Arterial line-discontinued  Foley catheter  Antibiotics:  IV Vanc 12/3>DC'd   IV Aztreonam 12/3> 12/4   IV Ancef.  Subjective: Patient awake alert oriented, appears to have capacity , She states that she feels better Patient actually had a hamburger and french fries yesterday  Objective: Filed Vitals:   03/30/14 2035 03/31/14 1417 03/31/14 2136 04/01/14 0427  BP: 123/60 136/69 136/57 135/73  Pulse: 119 92 90 91  Temp:  97.9 F (36.6  C) 97.8 F (36.6 C) 97.7 F (36.5 C)  TempSrc: Axillary Oral Axillary Axillary  Resp: 16 17 22 20   Height: 5' 4"  (1.626 m)     Weight: 94.394 kg (208 lb 1.6 oz)   93.123 kg (205 lb 4.8 oz)  SpO2: 96% 98% 97% 100%   blood pressures actually better than listed bowel-as discussed with nursing systolic in the 878M and map 73   Intake/Output Summary (Last 24 hours) at 04/01/14 1153 Last data filed at 04/01/14 0900  Gross per 24 hour  Intake 2038.67 ml  Output   1050 ml  Net 988.67 ml   Filed Weights   03/30/14 0300 03/30/14 2035  04/01/14 0427  Weight: 91.6 kg (201 lb 15.1 oz) 94.394 kg (208 lb 1.6 oz) 93.123 kg (205 lb 4.8 oz)     Exam:  General exam: Pleasant middle-aged female, extremely dry oral mucosa, generalized anasarca Respiratory system: Slightly diminished breath sounds in the bases but otherwise clear to auscultation. No increased work of breathing. Cardiovascular system: S1 & S2 heard, RRR. No JVD, murmurs, gallops, clicks. Anasarca. Telemetry: SB in the 50s-SR. Gastrointestinal system: Abdomen is nondistended, soft and nontender. Normal bowel sounds heard. Patient has patchy ecchymosis over right upper quadrant. Central nervous system: Alert and oriented 3. No focal neurological deficits. Extremities: Symmetric 5 x 5 power. Diffuse anasarca Skin: Dry appearing right sacral decubitus without signs of overt infection. Patient has skin tear over the right posterior upper arm. Left heel DTI without signs of acute infection. Psychiatry: Pleasant and comfortable today. Denies suicidal ideations.   Data Reviewed: Basic Metabolic Panel:  Recent Labs Lab 03/26/14 0421  03/28/14 0430 03/29/14 1120 03/30/14 0400 03/31/14 0435 04/01/14 0613  NA 138  < > 143 145 146 146 143  K 3.5*  < > 5.2 3.6* 3.5* 3.0* 3.4*  CL 106  < > 111 112 113* 113* 112  CO2 20  < > 19 19 19  18* 18*  GLUCOSE 103*  < > 64* 77 167* 113* 197*  BUN 52*  < > 59* 67* 74* 80* 82*  CREATININE 1.26*  < > 1.33* 1.55* 1.63* 1.55* 1.38*  CALCIUM 7.5*  < > 7.1* 7.8* 8.0* 7.7* 7.6*  MG 2.2  --   --  2.3 2.3 2.1  2.2  --   PHOS 4.3  --   --  4.7* 5.0* 4.1  --   < > = values in this interval not displayed. Liver Function Tests:  Recent Labs Lab 03/28/14 0430 03/29/14 1120 03/30/14 0400 03/31/14 0435 04/01/14 0613  AST 46* 35 25 26 26   ALT <5 <5 <5 <5 <5  ALKPHOS 392* 309* 245* 260* 264*  BILITOT 1.1 1.3* 0.9 0.9 0.8  PROT 4.8* 5.3* 5.1* 5.2* 5.2*  ALBUMIN 1.6* 2.4* 2.4* 2.3* 2.1*   No results for input(s): LIPASE, AMYLASE in  the last 168 hours. No results for input(s): AMMONIA in the last 168 hours. CBC:  Recent Labs Lab 03/29/14 0420 03/29/14 1120 03/30/14 0400 03/31/14 0435 04/01/14 0613  WBC 3.8* 3.4* 3.4* 4.1 4.0  NEUTROABS  --   --  2.9  --   --   HGB 8.9* 8.7* 8.2* 8.9* 8.5*  HCT 27.3* 27.2* 25.2* 27.9* 26.5*  MCV 87.8 88.0 89.0 90.3 91.1  PLT 75* 74* 63* 64* 51*   Cardiac Enzymes: No results for input(s): CKTOTAL, CKMB, CKMBINDEX, TROPONINI in the last 168 hours. BNP (last 3 results) No results for input(s): PROBNP in the last  8760 hours. CBG:  Recent Labs Lab 03/31/14 2007 04/01/14 0019 04/01/14 0426 04/01/14 0732 04/01/14 1140  GLUCAP 317* 247* 172* 159* 154*    Recent Results (from the past 240 hour(s))  Body fluid culture     Status: None   Collection Time: 03/25/14  6:37 PM  Result Value Ref Range Status   Specimen Description SYNOVIAL RIGHT KNEE  Final   Special Requests NONE  Final   Gram Stain   Final    FEW WBC PRESENT,BOTH PMN AND MONONUCLEAR NO ORGANISMS SEEN Performed at [facility]    Culture   Final    NO GROWTH 3 DAYS Performed at [facility]    Report Status 03/29/2014 FINAL  Final  Body fluid culture     Status: None   Collection Time: 03/25/14  6:46 PM  Result Value Ref Range Status   Specimen Description SYNOVIAL LEFT KNEE  Final   Special Requests NONE  Final   Gram Stain   Final    ABUNDANT WBC PRESENT,BOTH PMN AND MONONUCLEAR NO ORGANISMS SEEN Performed at [facility]    Culture   Final    RARE STAPHYLOCOCCUS AUREUS Note: RIFAMPIN AND GENTAMICIN SHOULD NOT BE USED AS SINGLE DRUGS FOR TREATMENT OF STAPH INFECTIONS. CALLED TO KIMBERLY OKAFOR 03/30/14 1315 BY SMITHERSJ Performed at [facility]    Report Status 03/31/2014 FINAL  Final   Organism ID, Bacteria STAPHYLOCOCCUS AUREUS  Final      Susceptibility   Staphylococcus aureus - MIC*    CLINDAMYCIN <=0.25 SENSITIVE Sensitive     ERYTHROMYCIN <=0.25  SENSITIVE Sensitive     GENTAMICIN <=0.5 SENSITIVE Sensitive     LEVOFLOXACIN 0.25 SENSITIVE Sensitive     OXACILLIN 0.5 SENSITIVE Sensitive     PENICILLIN 0.12 SENSITIVE Sensitive     RIFAMPIN <=0.5 SENSITIVE Sensitive     TRIMETH/SULFA <=10 SENSITIVE Sensitive     VANCOMYCIN <=0.5 SENSITIVE Sensitive     TETRACYCLINE <=1 SENSITIVE Sensitive     MOXIFLOXACIN <=0.25 SENSITIVE Sensitive     * RARE STAPHYLOCOCCUS AUREUS         Studies: No results found.      Scheduled Meds: . antiseptic oral rinse  7 mL Mouth Rinse q12n4p  . atorvastatin  20 mg Oral q1800  . bisacodyl  10 mg Rectal Once  .  ceFAZolin (ANCEF) IV  2 g Intravenous Q8H  . chlorhexidine  15 mL Mouth Rinse BID  . feeding supplement (ENSURE COMPLETE)  237 mL Oral TID BM  . fentaNYL  12.5 mcg Transdermal Q72H  . fludrocortisone  0.1 mg Oral Daily  . hydrocortisone cream   Topical BID  . insulin aspart  0-15 Units Subcutaneous 6 times per day  . insulin glargine  12 Units Subcutaneous Daily  . methylPREDNISolone (SOLU-MEDROL) injection  40 mg Intravenous Daily  . metoprolol tartrate  12.5 mg Oral q morning - 10a  . mirtazapine  15 mg Oral QHS  . OLANZapine zydis  5 mg Oral QHS  . pantoprazole (PROTONIX) IV  40 mg Intravenous Q12H  . phytonadione (VITAMIN K) IV  10 mg Intravenous q morning - 10a  . sodium bicarbonate  650 mg Oral TID  . sodium chloride  10-40 mL Intracatheter Q12H   Continuous Infusions: . TPN (CLINIMIX) Adult without lytes 40 mL/hr at 03/31/14 1723   And  . fat emulsion 250 mL (03/31/14 1723)  . 0.9 % sodium chloride with kcl 50 mL/hr at 04/01/14 [id number]  Active Problems:   DM2 (diabetes mellitus, type 2)   Physical deconditioning   Hypoalbuminemia   Hyponatremia   Severe sepsis   Acute UTI   Protein-calorie malnutrition, severe   Sacral decubitus ulcer   Acute pulmonary edema   Effusion into joint   ST elevation myocardial infarction (STEMI) of inferolateral wall, initial episode  of care   Severe sepsis with septic shock   Diabetic ulcer of left foot associated with diabetes mellitus due to underlying condition   Osteomyelitis   Ulcer of heel   Acute osteomyelitis of femur   Septic arthritis of knee, left   STEMI (ST elevation myocardial infarction)   Ulcer of left heel   Metabolic acidosis   Bacteremia   Knee pain   Ileus   Nausea with vomiting   Palliative care encounter   Appetite loss    Time spent: 40 minutes.    Reyne Dumas, MD [facility] Pager [phone]  If 7PM-7AM, please contact night-coverage [email or web address] Password [email or web address] 04/01/2014, 11:53 AM    LOS: 13 days

## 2014-04-01 NOTE — Progress Notes (Signed)
PARENTERAL NUTRITION CONSULT NOTE - Follow up  Pharmacy Consult for TPN Indication: intolerance to enteral feeding  Allergies  Allergen Reactions  . Penicillins Rash    Patient Measurements: Height: 5' 4"  (162.6 cm) Weight: 205 lb 4.8 oz (93.123 kg) IBW/kg (Calculated) : 54.7  Vital Signs: Temp: 97.7 F (36.5 C) (12/16 0427) Temp Source: Axillary (12/16 0427) BP: 135/73 mmHg (12/16 0427) Pulse Rate: 91 (12/16 0427) Intake/Output from previous day: 12/15 0701 - 12/16 0700 In: 1874.5 [P.O.:100; I.V.:986.7; IV Piggyback:100; TPN:687.8] Out: 1050 [Urine:1050] Intake/Output from this shift: Total I/O In: 164.2 [I.V.:164.2] Out: -   Labs:  Recent Labs  03/30/14 0400 03/31/14 0435 04/01/14 0613  WBC 3.4* 4.1 4.0  HGB 8.2* 8.9* 8.5*  HCT 25.2* 27.9* 26.5*  PLT 63* 64* 51*  INR  --  1.29  --      Recent Labs  03/30/14 0400 03/31/14 0435 04/01/14 0613  NA 146 146 143  K 3.5* 3.0* 3.4*  CL 113* 113* 112  CO2 19 18* 18*  GLUCOSE 167* 113* 197*  BUN 74* 80* 82*  CREATININE 1.63* 1.55* 1.38*  CALCIUM 8.0* 7.7* 7.6*  MG 2.3 2.1  2.2  --   PHOS 5.0* 4.1  --   PROT 5.1* 5.2* 5.2*  ALBUMIN 2.4* 2.3* 2.1*  AST 25 26 26   ALT <5 <5 <5  ALKPHOS 245* 260* 264*  BILITOT 0.9 0.9 0.8  PREALBUMIN 11.5*  --   --   TRIG 65  --   --    Estimated Creatinine Clearance: 48 mL/min (by C-G formula based on Cr of 1.38).    Recent Labs  04/01/14 0426 04/01/14 0732 04/01/14 1140  GLUCAP 172* 159* 154*    Medical History: Past Medical History  Diagnosis Date  . Hypertension   . Diabetes mellitus without complication   . Arthritis   . Coronary artery disease     pt unaware    Medications:  Scheduled:  . antiseptic oral rinse  7 mL Mouth Rinse q12n4p  . bisacodyl  10 mg Rectal Once  .  ceFAZolin (ANCEF) IV  2 g Intravenous Q8H  . chlorhexidine  15 mL Mouth Rinse BID  . feeding supplement (ENSURE COMPLETE)  237 mL Oral TID BM  . fentaNYL  25 mcg Transdermal  Q72H  . fludrocortisone  0.1 mg Oral Daily  . hydrocortisone cream   Topical BID  . insulin aspart  0-15 Units Subcutaneous 6 times per day  . insulin glargine  12 Units Subcutaneous Daily  . methylPREDNISolone (SOLU-MEDROL) injection  40 mg Intravenous Daily  . metoprolol tartrate  12.5 mg Oral q morning - 10a  . mirtazapine  15 mg Oral QHS  . OLANZapine zydis  5 mg Oral QHS  . pantoprazole (PROTONIX) IV  40 mg Intravenous Q12H  . phytonadione (VITAMIN K) IV  10 mg Intravenous q morning - 10a  . sodium bicarbonate  650 mg Oral TID  . sodium chloride  10-40 mL Intracatheter Q12H   Infusions:  . TPN (CLINIMIX) Adult without lytes 40 mL/hr at 03/31/14 1723   And  . fat emulsion 250 mL (03/31/14 1723)  . 0.9 % sodium chloride with kcl     PRN: acetaminophen **OR** acetaminophen, chlorpheniramine-HYDROcodone, HYDROmorphone (DILAUDID) injection, ondansetron **OR** ondansetron (ZOFRAN) IV, sodium chloride   Insulin Requirements:   Lantus 12 units daily (started 12/12, previously 15 units daily 12/7-12/11)  Sensitive SSI: 24 units yesterday  Current Nutrition:  Ensure supplement TID, has been refusing,  did eat french fries and hamburger yesterday  IVF:  NS 15mq KCl/L @ 574mhr started 12/16  Central access: PICC line placed 12/8 TPN start date: 12/13  ASSESSMENT                                                                                                          58 yoF admitted on 12/3 with fever, confusion, refusing care/PO intake, septic shock with recurrent MSSA bacteremia.  PICC line placed 12/8 for prolonged IV antibiotics.  She has severe deconditioning and severe protein calorie malnutrition; she has failed to advance tube feeds to goal and continues to complain of N/V with enteral intake.  MD to discuss PEG tube placement for long-term management, but may not be available until next week.  She has been started on steroids, olanzapine, mirtazapine, and prn anti-emetics to  help with nausea and anorexia.  Pharmacy is consulted to dose TPN.  She is high risk for refeeding.  Significant events:  12/8 Pt refusing PO.  Pharmacy consulted for TPN, but NOT started d/t contraindication w/ functioning gut.  Pt and husband agree to try enteral feeding.   12/10 Glucerna tolerated at 20 ml/hr but had N/V when advanced to 30 ml/hr. 12/11 TF advanced to 40 ml/hr overnight, but pt had large volume emesis.  TF held, pt refusing all PO intake. 12/13 TPN initiated  Today, 04/01/2014:  Glucose - CBGs elevated since TPN initiation.  Currently on steroids, which can also elevate CBGs. Receiving Lantus and SSI.  Electrolytes - Phos 4.1 (12/15), K low at 3.4 (KCl added to maintenance fluids Mag WNL, CorrCa WNL at 9.12.    NaBicarb tabs ordered TID, refusing most doses-took 3 doses yesterday  Renal - SCr 1.38, elevated but improved from 1.55  LFTs - AST/ALT WNL, Tbili now WNL, Alk phos elevated  TGs - 65   Prealbumin: 11.5 (12/14) improved  Patient refused all oral medications this AM and refused the Ensure pudding. Discussed with Dr. AbAvie Echevarias to continue TPN for now.  NUTRITIONAL GOALS                                                                                              RD recs (12/11): 1800-2000 kCal, 100-110 grams of protein per day  Clinimix 5/15 at a goal rate of 83 ml/hr + 20% fat emulsion at 10 ml/hr to provide: 100 g/day protein, 1894 Kcal/day.  PLAN  At 1800 today:  Continue Clinimix 5/15 formulation to WITHOUT electrolytes due to previous elevated Phos.  Continue rate of 86m/hour in anticipation of patient going to a hospice facility tomorrow.  20% fat emulsion at 10 ml/hr.  Plan not to advance due to potential discharge tomorrow.  TPN to contain standard multivitamins and trace elements.  IVF rate per MD.  Continue Lantus  12 units daily.  Continue SSI to moderate scale with continued blood sugar checks q4h.  TPN lab panels on Mondays & Thursdays.  F/u daily , continue to watch for refeeding syndrome.   EDolly RiasRPh 04/01/2014, 12:52 PM Pager [phone]

## 2014-04-01 NOTE — Progress Notes (Signed)
[facility]    Date of Admission:  03/19/2014   Total days of antibiotics 14        Day 12cefazolin           ID: Robin Schroeder is a 58 y.o. female with recurrent MSSA bacteremia from LEFT SEPTIC ARTHRITIS c/b inferior STEMI, AKI, deconditioned/bed bound, severe RA, depression, and anasarca from severe protein caloric malnutrition Active Problems:   DM2 (diabetes mellitus, type 2)   Physical deconditioning   Hypoalbuminemia   Hyponatremia   Severe sepsis   Acute UTI   Protein-calorie malnutrition, severe   Sacral decubitus ulcer   Acute pulmonary edema   Effusion into joint   ST elevation myocardial infarction (STEMI) of inferolateral wall, initial episode of care   Severe sepsis with septic shock   Diabetic ulcer of left foot associated with diabetes mellitus due to underlying condition   Osteomyelitis   Ulcer of heel   Acute osteomyelitis of femur   Septic arthritis of knee, left   STEMI (ST elevation myocardial infarction)   Ulcer of left heel   Metabolic acidosis   Bacteremia   Knee pain   Ileus   Nausea with vomiting   Palliative care encounter   Appetite loss    Subjective: Afebrile, tearful, "Robin Schroeder is supposed to  Be here"   Medications:  . antiseptic oral rinse  7 mL Mouth Rinse q12n4p  . bisacodyl  10 mg Rectal Once  .  ceFAZolin (ANCEF) IV  2 g Intravenous Q8H  . chlorhexidine  15 mL Mouth Rinse BID  . feeding supplement (ENSURE COMPLETE)  237 mL Oral TID BM  . fentaNYL  25 mcg Transdermal Q72H  . fludrocortisone  0.1 mg Oral Daily  . hydrocortisone cream   Topical BID  . insulin aspart  0-15 Units Subcutaneous 6 times per day  . insulin glargine  12 Units Subcutaneous Daily  . methylPREDNISolone (SOLU-MEDROL) injection  40 mg Intravenous Daily  . metoprolol tartrate  12.5 mg Oral q morning - 10a  . mirtazapine  15 mg Oral QHS  . OLANZapine zydis  5 mg Oral QHS  . pantoprazole (PROTONIX) IV  40 mg Intravenous Q12H  .  phytonadione (VITAMIN K) IV  10 mg Intravenous q morning - 10a  . sodium bicarbonate  650 mg Oral TID  . sodium chloride  10-40 mL Intracatheter Q12H    Objective: Vital signs in last 24 hours: Temp:  [97.7 F (36.5 C)-98.1 F (36.7 C)] 98.1 F (36.7 C) (12/16 1408) Pulse Rate:  [90-95] 95 (12/16 1408) Resp:  [19-22] 19 (12/16 1408) BP: (135-140)/(57-78) 140/78 mmHg (12/16 1408) SpO2:  [97 %-100 %] 98 % (12/16 1408) Weight:  [93.123 kg (205 lb 4.8 oz)] 93.123 kg (205 lb 4.8 oz) (12/16 0427) Physical Exam  Constitutional: . appears anasarcic, disheveled.  looks fatigue. Answering some questions today. Awakens to verbal stimuli Heent: dried blood in nares Cardiovascular: Normal rate, regular rhythm and normal heart sounds. Exam reveals no gallop and no friction rub.  No murmur heard.  Pulmonary/Chest: Effort normal and breath sounds normal. No respiratory distress.  has no wheezes.  Abdominal: mildlly distension. Decrease bowel sounds.There is no tenderness.  Skin:d heel ulcer to medial aspect of left leg. echymosis to right foot sole, decub to right calf Ext: diffusely anasarca are equal in L and R arm, more so that anasarca to lower extremities. Psychiatric: flat affect, not responding to questions  Lab Results  Recent  Labs  03/31/14 0435 04/01/14 0613  WBC 4.1 4.0  HGB 8.9* 8.5*  HCT 27.9* 26.5*  NA 146 143  K 3.0* 3.4*  CL 113* 112  CO2 18* 18*  BUN 80* 82*  CREATININE 1.55* 1.38*   Lab Results  Component Value Date   ESRSEDRATE 90* 03/21/2014   Lab Results  Component Value Date   CRP 16.1* 03/21/2014   Arthrocentesis =  40,000 cells 88%N and 11,050 95% N and no cells. Cloudy with rBC Microbiology: 12/3 blood cx x 2 MSSA 12/4 blood cx NGTD 12/9 left knee arthrocentesis = Staph aureus 12/9 right knee arthrocentesis = NGTD  Studies/Results: No results found.  Assessment/Plan: Recurrent MSSA bacteremia likely associated with left knee septic arthritis.  Unclear if other sources since she has numerous pressure ulcers, unable to mobilize due to deconditioning and polyarthritis from untreated rheumatoid arthritis,(newly diagnosed in Oct 2015) severe protein caloric malnutrition, and oligouric ATN AKI. Patient and family having discussion with palliative care. Plan is unchanged from ID standpoint  MSSA bacteremia = recurrent bacteremia maybe due to left knee septic arthritis (since cx grew MSSA).  We will  treat for extended course with 6 wk with cefazolin 2gm IV q8hr for now using 12/9 as day 1 of 42.  MSSA septic arthritis = normally would have orthopedics wash out. Patient is in such poor deconditioned state, not sure what they would recommend. Would wait to consult ortho since patient will likely needs goals of care discussion. Recent STEMI, inferior infarct precludes her from any extensive sedation.  If she becomes clinically stable, may consider getting MRI of left foot to see if signs of osteomyelitis associate with heal decub.   aki with oligouria =  Currently getting blood tsf and albumin. Defer to renal fro further recs  Coagulopathy = getting vitamin k to minimize bleeding and possibly plt tsf today  Anasarca  2/2 protein caloric malnutrition= currently on TPN. Awaiting to address goals of care to see if pursuing PEG would be better mode of treating malnutrition. Challenging in many regards due to diffuse anasarca as well as thrombocytopenia. Currently unable to feed herself. Will need assistance  Pressure ulcer = continue with local wound care  Newly diagnosed Rheumatoid Arthritis = continue on prednisone 20 mg daily to see if she has temporary relief of joint pain, possibly increased appetite. I suspect her immobility is in part due other polyarthritis from RA.   - need pt and ot to help with range of motion to minimize deconditioning  ACS/inferior STEMI= in setting of sepsis from MSSA. Cards recommending low dose asa and low dose metop  for management.   Depression =   Appreciated Dr. Shela Commons input on management    Robin Schroeder, [facility] Cell: [phone] Pager: [phone]  04/01/2014, 2:55 PM

## 2014-04-01 NOTE — Progress Notes (Signed)
ANTIBIOTIC CONSULT NOTE - INITIAL  Pharmacy Consult for Fluconazole Indication: Fungal UTI, oropharyngeal candidiasis   Allergies  Allergen Reactions  . Penicillins Rash    Patient Measurements: Height: 5\' 4"  (162.6 cm) Weight: 205 lb 4.8 oz (93.123 kg) IBW/kg (Calculated) : 54.7  Vital Signs: Temp: 98.1 F (36.7 C) (12/16 1408) Temp Source: Oral (12/16 1408) BP: 140/78 mmHg (12/16 1408) Pulse Rate: 95 (12/16 1408) Intake/Output from previous day: 12/15 0701 - 12/16 0700 In: 1874.5 [P.O.:100; I.V.:986.7; IV Piggyback:100; TPN:687.8] Out: 1050 [Urine:1050] Intake/Output from this shift:    Labs:  Recent Labs  03/30/14 0400 03/31/14 0435 04/01/14 0613  WBC 3.4* 4.1 4.0  HGB 8.2* 8.9* 8.5*  PLT 63* 64* 51*  CREATININE 1.63* 1.55* 1.38*   Estimated Creatinine Clearance: 48 mL/min (by C-G formula based on Cr of 1.38). No results for input(s): VANCOTROUGH, VANCOPEAK, VANCORANDOM, GENTTROUGH, GENTPEAK, GENTRANDOM, TOBRATROUGH, TOBRAPEAK, TOBRARND, AMIKACINPEAK, AMIKACINTROU, AMIKACIN in the last 72 hours.   Microbiology: Recent Results (from the past 720 hour(s))  Urine culture     Status: None   Collection Time: 03/19/14  3:04 PM  Result Value Ref Range Status   Specimen Description URINE, CATHETERIZED  Final   Special Requests NONE  Final   Culture  Setup Time   Final    03/20/2014 00:29 Performed at 14/07[facility]2013 Count   Final    >=100,000 COLONIES/ML Performed at [facility]    Culture   Final    ESCHERICHIA COLI Note: Two isolates with different morphologies were identified as the same organism.The most resistant organism was reported. Performed at [facility]    Report Status 03/23/2014 FINAL  Final   Organism ID, Bacteria ESCHERICHIA COLI  Final      Susceptibility   Escherichia coli - MIC*    AMPICILLIN >=32 RESISTANT Resistant     CEFAZOLIN <=4 SENSITIVE Sensitive     CEFTRIAXONE <=1 SENSITIVE Sensitive    CIPROFLOXACIN <=0.25 SENSITIVE Sensitive     GENTAMICIN <=1 SENSITIVE Sensitive     LEVOFLOXACIN <=0.12 SENSITIVE Sensitive     NITROFURANTOIN <=16 SENSITIVE Sensitive     TOBRAMYCIN <=1 SENSITIVE Sensitive     TRIMETH/SULFA >=320 RESISTANT Resistant     PIP/TAZO <=4 SENSITIVE Sensitive     * ESCHERICHIA COLI  Blood Culture (routine x 2)     Status: None   Collection Time: 03/19/14  3:23 PM  Result Value Ref Range Status   Specimen Description BLOOD RIGHT WRIST  Final   Special Requests BOTTLES DRAWN AEROBIC AND ANAEROBIC 5 ML  Final   Culture  Setup Time   Final    03/20/2014 07:48 Performed at 14/07/2013    Culture   Final    STAPHYLOCOCCUS AUREUS Note: RIFAMPIN AND GENTAMICIN SHOULD NOT BE USED AS SINGLE DRUGS FOR TREATMENT OF STAPH INFECTIONS. Note: Gram Stain Report Called to,Read Back By and Verified With: FAYE S BY INGRAM A 03/20/14 11AM Performed at 14/[facility]/15    Report Status 03/22/2014 FINAL  Final   Organism ID, Bacteria STAPHYLOCOCCUS AUREUS  Final      Susceptibility   Staphylococcus aureus - MIC*    CLINDAMYCIN <=0.25 SENSITIVE Sensitive     ERYTHROMYCIN <=0.25 SENSITIVE Sensitive     GENTAMICIN <=0.5 SENSITIVE Sensitive     LEVOFLOXACIN <=0.12 SENSITIVE Sensitive     OXACILLIN 0.5 SENSITIVE Sensitive     PENICILLIN RESISTANT      RIFAMPIN <=0.5 SENSITIVE Sensitive  TRIMETH/SULFA <=10 SENSITIVE Sensitive     VANCOMYCIN <=0.5 SENSITIVE Sensitive     TETRACYCLINE <=1 SENSITIVE Sensitive     MOXIFLOXACIN <=0.25 SENSITIVE Sensitive     * STAPHYLOCOCCUS AUREUS  Blood Culture (routine x 2)     Status: None   Collection Time: 03/19/14  3:23 PM  Result Value Ref Range Status   Specimen Description BLOOD BLOOD LEFT FOREARM  Final   Special Requests BOTTLES DRAWN AEROBIC AND ANAEROBIC 5 ML  Final   Culture  Setup Time   Final    03/20/2014 07:51 Performed at [facility]    Culture   Final    STAPHYLOCOCCUS AUREUS Note:  SUSCEPTIBILITIES PERFORMED ON PREVIOUS CULTURE WITHIN THE LAST 5 DAYS. Note: Gram Stain Report Called to,Read Back By and Verified With: FAYE S BY INGRAM A 03/20/14 11AM Performed at [facility]    Report Status 03/22/2014 FINAL  Final  Culture, blood (routine x 2)     Status: None   Collection Time: 03/20/14  5:30 PM  Result Value Ref Range Status   Specimen Description BLOOD RIGHT HAND  Final   Special Requests BOTTLES DRAWN AEROBIC ONLY 8CC  Final   Culture  Setup Time   Final    03/21/2014 01:05 Performed at [facility]    Culture   Final    NO GROWTH 5 DAYS Performed at [facility]    Report Status 03/27/2014 FINAL  Final  Culture, blood (routine x 2)     Status: None   Collection Time: 03/20/14  5:37 PM  Result Value Ref Range Status   Specimen Description BLOOD LEFT ARM  Final   Special Requests   Final    BOTTLES DRAWN AEROBIC ONLY BLUE BOTTLE 9 CC, RED BOTTLE 1 CC   Culture  Setup Time   Final    03/21/2014 01:06 Performed at [facility]    Culture   Final    NO GROWTH 5 DAYS Performed at [facility]    Report Status 03/27/2014 FINAL  Final  Body fluid culture     Status: None   Collection Time: 03/25/14  6:37 PM  Result Value Ref Range Status   Specimen Description SYNOVIAL RIGHT KNEE  Final   Special Requests NONE  Final   Gram Stain   Final    FEW WBC PRESENT,BOTH PMN AND MONONUCLEAR NO ORGANISMS SEEN Performed at [facility]    Culture   Final    NO GROWTH 3 DAYS Performed at [facility]    Report Status 03/29/2014 FINAL  Final  Body fluid culture     Status: None   Collection Time: 03/25/14  6:46 PM  Result Value Ref Range Status   Specimen Description SYNOVIAL LEFT KNEE  Final   Special Requests NONE  Final   Gram Stain   Final    ABUNDANT WBC PRESENT,BOTH PMN AND MONONUCLEAR NO ORGANISMS SEEN Performed at [facility]    Culture   Final    RARE STAPHYLOCOCCUS AUREUS Note:  RIFAMPIN AND GENTAMICIN SHOULD NOT BE USED AS SINGLE DRUGS FOR TREATMENT OF STAPH INFECTIONS. CALLED TO KIMBERLY OKAFOR 03/30/14 1315 BY SMITHERSJ Performed at [facility]    Report Status 03/31/2014 FINAL  Final   Organism ID, Bacteria STAPHYLOCOCCUS AUREUS  Final      Susceptibility   Staphylococcus aureus - MIC*    CLINDAMYCIN <=0.25 SENSITIVE Sensitive     ERYTHROMYCIN <=0.25 SENSITIVE Sensitive  GENTAMICIN <=0.5 SENSITIVE Sensitive     LEVOFLOXACIN 0.25 SENSITIVE Sensitive     OXACILLIN 0.5 SENSITIVE Sensitive     PENICILLIN 0.12 SENSITIVE Sensitive     RIFAMPIN <=0.5 SENSITIVE Sensitive     TRIMETH/SULFA <=10 SENSITIVE Sensitive     VANCOMYCIN <=0.5 SENSITIVE Sensitive     TETRACYCLINE <=1 SENSITIVE Sensitive     MOXIFLOXACIN <=0.25 SENSITIVE Sensitive     * RARE STAPHYLOCOCCUS AUREUS    Medical History: Past Medical History  Diagnosis Date  . Hypertension   . Diabetes mellitus without complication   . Arthritis   . Coronary artery disease     pt unaware    Medications:  Scheduled:  . antiseptic oral rinse  7 mL Mouth Rinse q12n4p  .  ceFAZolin (ANCEF) IV  2 g Intravenous Q8H  . chlorhexidine  15 mL Mouth Rinse BID  . feeding supplement (ENSURE COMPLETE)  237 mL Oral TID BM  . fentaNYL  25 mcg Transdermal Q72H  . fludrocortisone  0.1 mg Oral Daily  . hydrocortisone cream   Topical BID  . insulin aspart  0-15 Units Subcutaneous 6 times per day  . insulin glargine  12 Units Subcutaneous Daily  . magic mouthwash  10 mL Oral TID  . methylPREDNISolone (SOLU-MEDROL) injection  40 mg Intravenous Daily  . metoprolol tartrate  12.5 mg Oral q morning - 10a  . mirtazapine  15 mg Oral QHS  . OLANZapine zydis  5 mg Oral QHS  . pantoprazole (PROTONIX) IV  40 mg Intravenous Q12H  . phytonadione (VITAMIN K) IV  10 mg Intravenous q morning - 10a  . sodium bicarbonate  650 mg Oral TID  . sodium chloride  10-40 mL Intracatheter Q12H   Infusions:  . TPN (CLINIMIX)  Adult without lytes 40 mL/hr at 04/01/14 1749   And  . fat emulsion 250 mL (04/01/14 1748)  . 0.9 % sodium chloride with kcl 50 mL/hr at 04/01/14 1743   PRN: acetaminophen **OR** acetaminophen, HYDROmorphone (DILAUDID) injection, ondansetron **OR** ondansetron (ZOFRAN) IV, sodium chloride  Assessment: 58 yo female known to Pharmacy from TPN monitoring. Now consulted to dose fluconazole for possible oral candidiasis and fungal UTI.  Goal of Therapy:  Eradication of infection Dose appropriate for renal function  Plan:   Fluconazole 200mg  IV q24h  Follow renal function, planned duration of therapy  Loralee Pacas, PharmD, BCPS Pager: [phone] 04/01/2014,7:23 PM

## 2014-04-01 NOTE — Progress Notes (Signed)
Robin Schroeder   DOB:10-09-1955   UT#:[id number]   SFK#:[id number]  Patient Care Team: No Pcp Per Patient as PCP - General (General Practice)  Subjective:  Patient is asleep, did not disturb. Ill appearing. Per staff report, she does not engage in conversation. Intermittent nausea, vomiting controlled. Afebrile. Denies chest pain. No other issues reported. Cultures negative to date.  Scheduled Meds: . antiseptic oral rinse  7 mL Mouth Rinse q12n4p  . atorvastatin  20 mg Oral q1800  . bisacodyl  10 mg Rectal Once  .  ceFAZolin (ANCEF) IV  2 g Intravenous Q8H  . chlorhexidine  15 mL Mouth Rinse BID  . feeding supplement (ENSURE COMPLETE)  237 mL Oral TID BM  . fentaNYL  12.5 mcg Transdermal Q72H  . fludrocortisone  0.1 mg Oral Daily  . hydrocortisone cream   Topical BID  . insulin aspart  0-15 Units Subcutaneous 6 times per day  . insulin glargine  12 Units Subcutaneous Daily  . methylPREDNISolone (SOLU-MEDROL) injection  40 mg Intravenous Daily  . mirtazapine  15 mg Oral QHS  . OLANZapine zydis  5 mg Oral QHS  . pantoprazole (PROTONIX) IV  40 mg Intravenous Q12H  . phytonadione (VITAMIN K) IV  10 mg Intravenous q morning - 10a  . sodium bicarbonate  650 mg Oral TID  . sodium chloride  10-40 mL Intracatheter Q12H   Continuous Infusions: . TPN (CLINIMIX) Adult without lytes 40 mL/hr at 03/31/14 1723   And  . fat emulsion 250 mL (03/31/14 1723)  . 0.9 % sodium chloride with kcl 50 mL/hr at 04/01/14 0713   PRN Meds:acetaminophen **OR** acetaminophen, chlorpheniramine-HYDROcodone, HYDROmorphone (DILAUDID) injection, ondansetron **OR** ondansetron (ZOFRAN) IV, sodium chloride   Objective:  Filed Vitals:   04/01/14 0427  BP: 135/73  Pulse: 91  Temp: 97.7 F (36.5 C)  Resp: 20      Intake/Output Summary (Last 24 hours) at 04/01/14 0810 Last data filed at 04/01/14 1749  Gross per 24 hour  Intake 2038.67 ml  Output   1050 ml  Net 988.67 ml    ECOG PERFORMANCE STATUS:  4  GENERAL:ill appearing patient is asleep, responds to verbal and tactile stimuli SKIN: remarkable for petechiae and echymmoses on lower extremities, skin frail LUNGS: decreased breath sounds at the bases anteriorly. HEART: regular rate & rhythm and no murmurs and 3-4+ lower extremity edema ABDOMEN:abdomen soft, non-tender and decreased bowel sounds Musculoskeletal:no cyanosis of digits and no clubbing     CBG (last 3)   Recent Labs  04/01/14 0019 04/01/14 0426 04/01/14 0732  GLUCAP 247* 172* 159*     Labs:   Recent Labs Lab 03/29/14 0420 03/29/14 1120 03/30/14 0400 03/31/14 0435 04/01/14 0613  WBC 3.8* 3.4* 3.4* 4.1 4.0  HGB 8.9* 8.7* 8.2* 8.9* 8.5*  HCT 27.3* 27.2* 25.2* 27.9* 26.5*  PLT 75* 74* 63* 64* 51*  MCV 87.8 88.0 89.0 90.3 91.1  MCH 28.6 28.2 29.0 28.8 29.2  MCHC 32.6 32.0 32.5 31.9 32.1  RDW 18.6* 18.6* 18.9* 19.1* 19.2*  LYMPHSABS  --   --  0.3*  --   --   MONOABS  --   --  0.2  --   --   EOSABS  --   --  0.0  --   --   BASOSABS  --   --  0.0  --   --      Chemistries:    Recent Labs Lab 03/26/14 0421  03/28/14 0430 03/29/14 1120 03/30/14  0400 03/31/14 0435 04/01/14 0613  NA 138  < > 143 145 146 146 143  K 3.5*  < > 5.2 3.6* 3.5* 3.0* 3.4*  CL 106  < > 111 112 113* 113* 112  CO2 20  < > 19 19 19  18* 18*  GLUCOSE 103*  < > 64* 77 167* 113* 197*  BUN 52*  < > 59* 67* 74* 80* 82*  CREATININE 1.26*  < > 1.33* 1.55* 1.63* 1.55* 1.38*  CALCIUM 7.5*  < > 7.1* 7.8* 8.0* 7.7* 7.6*  MG 2.2  --   --  2.3 2.3 2.1  2.2  --   AST 52*  --  46* 35 25 26 26   ALT <5  --  <5 <5 <5 <5 <5  ALKPHOS 426*  --  392* 309* 245* 260* 264*  BILITOT 0.8  --  1.1 1.3* 0.9 0.9 0.8  < > = values in this interval not displayed.  GFR Estimated Creatinine Clearance: 48 mL/min (by C-G formula based on Cr of 1.38).  Liver Function Tests:  Recent Labs Lab 03/28/14 0430 03/29/14 1120 03/30/14 0400 03/31/14 0435 04/01/14 0613  AST 46* 35 25 26 26   ALT <5 <5  <5 <5 <5  ALKPHOS 392* 309* 245* 260* 264*  BILITOT 1.1 1.3* 0.9 0.9 0.8  PROT 4.8* 5.3* 5.1* 5.2* 5.2*  ALBUMIN 1.6* 2.4* 2.4* 2.3* 2.1*   No results for input(s): LIPASE, AMYLASE in the last 168 hours. No results for input(s): AMMONIA in the last 168 hours.  Urine Studies     Component Value Date/Time   COLORURINE AMBER* 03/27/2014 1721   APPEARANCEUR TURBID* 03/27/2014 1721   LABSPEC 1.021 03/27/2014 1721   PHURINE 5.0 03/27/2014 1721   GLUCOSEU NEGATIVE 03/27/2014 1721   HGBUR LARGE* 03/27/2014 1721   BILIRUBINUR NEGATIVE 03/27/2014 1721   KETONESUR NEGATIVE 03/27/2014 1721   PROTEINUR 100* 03/27/2014 1721   UROBILINOGEN 0.2 03/27/2014 1721   NITRITE NEGATIVE 03/27/2014 1721   LEUKOCYTESUR LARGE* 03/27/2014 1721    Coagulation profile  Recent Labs Lab 03/28/14 1445 03/31/14 0435  INR 1.70* 1.29    Cardiac Enzymes: No results for input(s): CKTOTAL, CKMB, CKMBINDEX, TROPONINI in the last 168 hours. BNP: Invalid input(s): POCBNP CBG:  Recent Labs Lab 03/31/14 1649 03/31/14 2007 04/01/14 0019 04/01/14 0426 04/01/14 0732  GLUCAP 263* 317* 247* 172* 159*  Lipid Profile  Recent Labs  03/30/14 0400  TRIG 65   Thyroid function studies No results for input(s): TSH, T4TOTAL, T3FREE, THYROIDAB in the last 72 hours.  Invalid input(s): [facility] Recent Results (from the past 240 hour(s))  Body fluid culture     Status: None   Collection Time: 03/25/14  6:37 PM  Result Value Ref Range Status   Specimen Description SYNOVIAL RIGHT KNEE  Final   Special Requests NONE  Final   Gram Stain   Final    FEW WBC PRESENT,BOTH PMN AND MONONUCLEAR NO ORGANISMS SEEN Performed at [facility]    Culture   Final    NO GROWTH 3 DAYS Performed at [facility]    Report Status 03/29/2014 FINAL  Final  Body fluid culture     Status: None   Collection Time: 03/25/14  6:46 PM  Result Value Ref Range Status   Specimen Description SYNOVIAL LEFT  KNEE  Final   Special Requests NONE  Final   Gram Stain   Final    ABUNDANT WBC PRESENT,BOTH PMN AND MONONUCLEAR NO ORGANISMS SEEN  Performed at [facility]   Final    RARE STAPHYLOCOCCUS AUREUS Note: RIFAMPIN AND GENTAMICIN SHOULD NOT BE USED AS SINGLE DRUGS FOR TREATMENT OF STAPH INFECTIONS. CALLED TO KIMBERLY OKAFOR 03/30/14 1315 BY SMITHERSJ Performed at [facility]    Report Status 03/31/2014 FINAL  Final   Organism ID, Bacteria STAPHYLOCOCCUS AUREUS  Final      Susceptibility   Staphylococcus aureus - MIC*    CLINDAMYCIN <=0.25 SENSITIVE Sensitive     ERYTHROMYCIN <=0.25 SENSITIVE Sensitive     GENTAMICIN <=0.5 SENSITIVE Sensitive     LEVOFLOXACIN 0.25 SENSITIVE Sensitive     OXACILLIN 0.5 SENSITIVE Sensitive     PENICILLIN 0.12 SENSITIVE Sensitive     RIFAMPIN <=0.5 SENSITIVE Sensitive     TRIMETH/SULFA <=10 SENSITIVE Sensitive     VANCOMYCIN <=0.5 SENSITIVE Sensitive     TETRACYCLINE <=1 SENSITIVE Sensitive     MOXIFLOXACIN <=0.25 SENSITIVE Sensitive     * RARE STAPHYLOCOCCUS AUREUS       Imaging Studies:  No results found.  Assessment/Plan: 58 y.o.   Anemia Thrombocytopenia In the setting of acute on chronic illness, including vaginal and urethral bleeding. CT abdomen shows splenomegaly ASA and Lovenox on hold S/p transfusion of 2 units of blood on 12/13.baseline is 9. Smear unremarkable.   Transfuse platelets if less than 20k or if  patient is bleeding.  New diagnosis of RA On IV solumedrol and pain meds as she is not able to tolerate oral meds  Septic Shock with MSSA bacteremia E Coli UTI Osteomyelitis of Left knee Cultures positive for Staph bacteremia Continue antibiotic therapy with Ancef and supportive care Appreciate CCM involvement  Inferolateral wall STEMI EF 50-55 % with  New distal inferior and apical hypokinesis per ECHO Appreciate Cards follow up  Anasarca In the setting of malnutrition Intolerant to tube  feeding, switched to TPN Other medical issues as per admitting team  DVT prophylaxis On SCDs  Code Status: Prognosis is poor. She is limited code. Discussions among [facility] team and family ongoing. Likely to be discharged to residential hospice   Other medical issues, including oliguria of renal failure, depression, CHF, deconditioning, nausea, anasarca, etc  as per primary team.  Rondel Jumbo, PA-C 04/01/2014  8:10 AM   ADDENDUM:  I agree with the above. Her platelet count continues to drop. Her hemoglobin is a little bit lower.  She is growing staph in the blood. It sounds like she has osteomyelitis of the left knee. This certainly could be affecting her bone marrow function.  The splenomegaly could be from the staph bacteremia.  I think we will have to be aggressive with respect to blood replacement depending on her blood counts.  I still do not see that we have to embark upon a bone marrow biopsy.  The outcome still is very much not certain.  Laurey Arrow

## 2014-04-01 NOTE — Progress Notes (Signed)
This is a medically complex patient with a complicating psychiatric illness. She was functional and happy prior to October 2015. She worked full-time up until December of last year but volunteered at [employer] and is described by her husband as independent and functional. He does describe her as a very private, modest person physically- he says that she was bullied at her job in [employer] and that her colleagues constantly made fun of her Danton Clap was fired after a series of falls (tripped on mats) on the job and recurrent upper respiratory infections. She was always trying to diet and loose weight. She never wanted to see doctors because she had been humiliated on prior visits about her health and weight. Her husband said that she had UTI symptoms but would not go the doctor. Her husband also described "the horror" of how she was cared for in the nursing home- he reports she was treated with a complete lack of compassion and had no sores on her body prior to going into the SNF. She has had an escalation of her pain and overall failure to thrive since Cassell Clement thinks she has probably lost "80 lbs".    It is too premature to shift towards full comfort care and her husband is definitely not ready to stop interventions. He does not fully understand the concept of hospice. It was helpful for me to have him explain what he understood about her care- and it was clear to me he understand very few facts and is very emotional over her condition.- her psychiatric issues are complicating caring for her - but I am not sure that we have fully characterized her medical problems and rapid decline.   Would not place PEG, she has significant sires in her mouth- I suspect this is a major issues for her PO intake- I added Fluconazole and MMW- she also had +many yeast in her urine and has prior antibiotic use and poorly controlled DM. ?etiology of the lesions- ?vesicular.  Agree with Duragesic patch  but may need to start with a 12.5 instead of patch she is quite sedated this evening.  Husband is requesting Psych meds be stopped-he thinks they are are causing her to be confused.  Will follow- she is still very much receiving a high level of inpatient medical care and evaluation/monitoring. Her uninsured status is challenging- ?status of medicaid?  Anderson Malta, DO Palliative Medicine Total Time: 60 minutes 5PM-6PM Greater than 50%  of this time was spent counseling and coordinating care related to the above assessment and plan.

## 2014-04-01 NOTE — Plan of Care (Signed)
Problem: Phase I Progression Outcomes Goal: OOB as tolerated unless otherwise ordered Outcome: Not Progressing Pt refuses even turning in bed side to side. States repeatedly through the day that she just wants to nap and to leave her alone.

## 2014-04-01 NOTE — Care Management Note (Signed)
CARE MANAGEMENT NOTE 04/01/2014  Patient:  Robin Schroeder, Robin Schroeder   Account Number:  [id number]  Date Initiated:  03/21/2014  Documentation initiated by:  Antony Haste  Subjective/Objective Assessment:   MSSA bacteremia     Action/Plan:   awaiting final recommendations   Anticipated DC Date:  03/28/2014   Anticipated DC Plan:  HOME W HOME HEALTH SERVICES      DC Planning Services  CM consult      Choice offered to / List presented to:             Status of service:  In process, will continue to follow Medicare Important Message given?   (If response is "NO", the following Medicare IM given date fields will be blank) Date Medicare IM given:   Medicare IM given by:   Date Additional Medicare IM given:   Additional Medicare IM given by:    Discharge Disposition:    Per UR Regulation:  Reviewed for med. necessity/level of care/duration of stay  If discussed at Long Length of Stay Meetings, dates discussed:   03/24/2014    Comments:  04/01/14 Sandford Craze RN,BSN,NCM [phone] Was notified by CSW that MD would like pt screened for LTAC eligibility.  Tommy from [employer] to look over pt info to see if pt qualifies.  Will await return call.  cm will continue to follow.  03/27/14 MMcGibboney, RN, BSN Following pt for [employer].   [phone] Earlene Plater, RN, BSN, CCM: Chart reviewed for continued stay and patient discharge needs. At time of review no discharge needs present will follow. Chart note for progression of care: Cardiac  Echo 12/4 showed LVEF low normal with focal wall motion abnormality  May still have been related to sepsis. For now would provide supportive care  She is pain free/BP limits Rx/Severe volume overload but third spacing  Note BUN/Cr have bumped. Would hold lasix for now  To get albumin Should have ischemic eval (cath) when medical problems resolve] 2.  ID  MRI done  Aspiration of knee later today  Patient remains critcially ill

## 2014-04-01 NOTE — Progress Notes (Signed)
Chart reviewed and case discussed with Dr. Greig Right. I will see Macil this afternoon- difficult psycho-social/psychiatric issues in setting of serious acute on chronic illness. It is premature to plan for hospice facility referral/ disposition until her medical issues and goals of care are determined- if IV antibiotics are to be continued and she remains a partial code then I would recommend SNF with full hospice or home with hospice with intention to not readmit since that seems to be the one aspect of her goals that is clear in terms of continued hospitalization. I will attempt to assist this patient and family in the next steps.  Will meet with them at 4:30-5PM today 12/16.  Anderson Malta, DO Palliative Medicine

## 2014-04-02 ENCOUNTER — Inpatient Hospital Stay (HOSPITAL_COMMUNITY): Payer: Medicaid Other

## 2014-04-02 DIAGNOSIS — A4101 Sepsis due to Methicillin susceptible Staphylococcus aureus: Secondary | ICD-10-CM

## 2014-04-02 DIAGNOSIS — B3741 Candidal cystitis and urethritis: Secondary | ICD-10-CM

## 2014-04-02 DIAGNOSIS — Z515 Encounter for palliative care: Secondary | ICD-10-CM

## 2014-04-02 DIAGNOSIS — Z01810 Encounter for preprocedural cardiovascular examination: Secondary | ICD-10-CM

## 2014-04-02 LAB — COMPREHENSIVE METABOLIC PANEL
ALBUMIN: 1.8 g/dL — AB (ref 3.5–5.2)
ALK PHOS: 295 U/L — AB (ref 39–117)
ANION GAP: 11 (ref 5–15)
AST: 33 U/L (ref 0–37)
BUN: 84 mg/dL — ABNORMAL HIGH (ref 6–23)
CALCIUM: 7.5 mg/dL — AB (ref 8.4–10.5)
CO2: 18 mEq/L — ABNORMAL LOW (ref 19–32)
Chloride: 112 mEq/L (ref 96–112)
Creatinine, Ser: 1.35 mg/dL — ABNORMAL HIGH (ref 0.50–1.10)
GFR calc Af Amer: 48 mL/min — ABNORMAL LOW (ref >90)
GFR calc non Af Amer: 42 mL/min — ABNORMAL LOW (ref >90)
Glucose, Bld: 229 mg/dL — ABNORMAL HIGH (ref 70–99)
Potassium: 4.1 mEq/L (ref 3.7–5.3)
SODIUM: 141 meq/L (ref 137–147)
TOTAL PROTEIN: 4.8 g/dL — AB (ref 6.0–8.3)
Total Bilirubin: 0.6 mg/dL (ref 0.3–1.2)

## 2014-04-02 LAB — CBC
HCT: 24 % — ABNORMAL LOW (ref 36.0–46.0)
Hemoglobin: 7.5 g/dL — ABNORMAL LOW (ref 12.0–15.0)
MCH: 28.8 pg (ref 26.0–34.0)
MCHC: 31.3 g/dL (ref 30.0–36.0)
MCV: 92.3 fL (ref 78.0–100.0)
Platelets: 47 10*3/uL — ABNORMAL LOW (ref 150–400)
RBC: 2.6 MIL/uL — AB (ref 3.87–5.11)
RDW: 19 % — ABNORMAL HIGH (ref 11.5–15.5)
WBC: 3.2 10*3/uL — AB (ref 4.0–10.5)

## 2014-04-02 LAB — PREPARE RBC (CROSSMATCH)

## 2014-04-02 LAB — GLUCOSE, CAPILLARY
GLUCOSE-CAPILLARY: 197 mg/dL — AB (ref 70–99)
GLUCOSE-CAPILLARY: 180 mg/dL — AB (ref 70–99)
Glucose-Capillary: 188 mg/dL — ABNORMAL HIGH (ref 70–99)
Glucose-Capillary: 221 mg/dL — ABNORMAL HIGH (ref 70–99)
Glucose-Capillary: 226 mg/dL — ABNORMAL HIGH (ref 70–99)
Glucose-Capillary: 272 mg/dL — ABNORMAL HIGH (ref 70–99)

## 2014-04-02 LAB — LACTATE DEHYDROGENASE: LDH: 341 U/L — ABNORMAL HIGH (ref 94–250)

## 2014-04-02 LAB — PROTIME-INR
INR: 1.3 (ref 0.00–1.49)
Prothrombin Time: 16.4 seconds — ABNORMAL HIGH (ref 11.6–15.2)

## 2014-04-02 LAB — CK: CK TOTAL: 17 U/L (ref 7–177)

## 2014-04-02 LAB — HEMOGLOBIN AND HEMATOCRIT, BLOOD
HCT: 28.7 % — ABNORMAL LOW (ref 36.0–46.0)
HEMOGLOBIN: 9 g/dL — AB (ref 12.0–15.0)

## 2014-04-02 LAB — MAGNESIUM: MAGNESIUM: 1.9 mg/dL (ref 1.5–2.5)

## 2014-04-02 LAB — PHOSPHORUS: Phosphorus: 3.2 mg/dL (ref 2.3–4.6)

## 2014-04-02 MED ORDER — NYSTATIN 100000 UNIT/ML MT SUSP
5.0000 mL | Freq: Four times a day (QID) | OROMUCOSAL | Status: DC
  Administered 2014-04-03 – 2014-04-05 (×3): 500000 [IU] via ORAL
  Filled 2014-04-02 (×20): qty 5

## 2014-04-02 MED ORDER — INSULIN ASPART 100 UNIT/ML ~~LOC~~ SOLN: 0.0000 [IU] | Freq: Three times a day (TID) | SUBCUTANEOUS | Status: DC

## 2014-04-02 MED ORDER — FENTANYL 12 MCG/HR TD PT72
12.5000 ug | MEDICATED_PATCH | TRANSDERMAL | Status: DC
  Administered 2014-04-02 – 2014-04-05 (×2): 12.5 ug via TRANSDERMAL
  Filled 2014-04-02 (×2): qty 1

## 2014-04-02 MED ORDER — SODIUM CHLORIDE 0.9 % IV SOLN
INTRAVENOUS | Status: DC
  Administered 2014-04-02 – 2014-04-05 (×3): via INTRAVENOUS

## 2014-04-02 MED ORDER — SODIUM CHLORIDE 0.9 % IV SOLN
INTRAVENOUS | Status: DC
Start: 2014-04-02 — End: 2014-04-02

## 2014-04-02 MED ORDER — TRACE MINERALS CR-CU-F-FE-I-MN-MO-SE-ZN IV SOLN
INTRAVENOUS | Status: DC
  Administered 2014-04-02: 18:00:00 via INTRAVENOUS
  Filled 2014-04-02: qty 2000

## 2014-04-02 MED ORDER — ONDANSETRON HCL 4 MG/2ML IJ SOLN
4.0000 mg | Freq: Four times a day (QID) | INTRAMUSCULAR | Status: DC
  Administered 2014-04-02 – 2014-04-06 (×15): 4 mg via INTRAVENOUS
  Filled 2014-04-02 (×14): qty 2

## 2014-04-02 MED ORDER — SODIUM CHLORIDE 0.9 % IV SOLN
Freq: Once | INTRAVENOUS | Status: AC
  Administered 2014-04-02: 16:00:00 via INTRAVENOUS

## 2014-04-02 MED ORDER — FAT EMULSION 20 % IV EMUL
250.0000 mL | INTRAVENOUS | Status: DC
  Administered 2014-04-02: 250 mL via INTRAVENOUS
  Filled 2014-04-02: qty 250

## 2014-04-02 MED ORDER — MODAFINIL 200 MG PO TABS
100.0000 mg | ORAL_TABLET | Freq: Every day | ORAL | Status: DC
  Administered 2014-04-04 – 2014-04-06 (×2): 100 mg via ORAL
  Filled 2014-04-02 (×2): qty 1

## 2014-04-02 MED ORDER — MAGIC MOUTHWASH W/LIDOCAINE
10.0000 mL | Freq: Three times a day (TID) | ORAL | Status: DC
  Administered 2014-04-02 – 2014-04-04 (×4): 10 mL via ORAL
  Filled 2014-04-02 (×16): qty 10

## 2014-04-02 MED ORDER — DAPTOMYCIN 500 MG IV SOLR
500.0000 mg | INTRAVENOUS | Status: DC
  Administered 2014-04-03 – 2014-04-05 (×4): 500 mg via INTRAVENOUS
  Filled 2014-04-02 (×4): qty 10

## 2014-04-02 MED ORDER — INSULIN ASPART 100 UNIT/ML ~~LOC~~ SOLN
0.0000 [IU] | SUBCUTANEOUS | Status: DC
  Administered 2014-04-02: 7 [IU] via SUBCUTANEOUS
  Administered 2014-04-02: 11 [IU] via SUBCUTANEOUS
  Administered 2014-04-03: 7 [IU] via SUBCUTANEOUS
  Administered 2014-04-03: 11 [IU] via SUBCUTANEOUS
  Administered 2014-04-03: 7 [IU] via SUBCUTANEOUS
  Administered 2014-04-03: 11 [IU] via SUBCUTANEOUS
  Administered 2014-04-03: 7 [IU] via SUBCUTANEOUS

## 2014-04-02 MED ORDER — DIAZEPAM 5 MG/ML IJ SOLN
5.0000 mg | Freq: Once | INTRAMUSCULAR | Status: AC
  Administered 2014-04-02: 5 mg via INTRAVENOUS
  Filled 2014-04-02: qty 2

## 2014-04-02 MED ORDER — IOHEXOL 300 MG/ML  SOLN: 25.0000 mL | INTRAMUSCULAR | Status: AC

## 2014-04-02 MED ORDER — IOHEXOL 300 MG/ML  SOLN
80.0000 mL | Freq: Once | INTRAMUSCULAR | Status: AC | PRN
  Administered 2014-04-02: 80 mL via INTRAVENOUS

## 2014-04-02 MED ORDER — ALBUMIN HUMAN 25 % IV SOLN
12.5000 g | Freq: Four times a day (QID) | INTRAVENOUS | Status: AC
  Administered 2014-04-02 – 2014-04-03 (×3): 12.5 g via INTRAVENOUS
  Filled 2014-04-02 (×3): qty 50

## 2014-04-02 NOTE — Clinical Social Work Note (Signed)
CSW continues to work with team to assist with disposition.  CSW is working on a SNF placement for patient and have updated Dr Susie Cassette of this as well as patient's husband. Will update as further progress is made. Reece Levy, MSW, Theresia Majors [phone]

## 2014-04-02 NOTE — Progress Notes (Signed)
Palliative Medicine Team Progress Note  Robin Schroeder continues to decline. She is irritable and extremely lethargic-she will not cooperate and is tearful asking that I leave her alone. She confirms that her mouth has been very sore and that she has pain with PO intake. She has little if any insight into her condition. She does not on my assessment today have capacity.   +vesicular mouth sores and some white patches +increase in edema- her abdomen appears much more distended-looks like ASCITES. +minimal PO intake +poorly controlled pain +lethargy   Assessment:  Multiple acute on chronic medical problems- I do feel her prognosis is as a whole very poor - her husband is searching for answers to her rapid decline since only 2 months ago he reports that she was completely independent. I tried to summarize the major problems  1. STEMI, Heart Failure 2. Pancytopenia, severe rapidly worsening thrombocytopenia, gums bleeding and nasal bleeding, vaginal bleeding 3. Uncharacterized skin wounds and lesions that evolved over the past month-they don't look like decubitus ulcers  4. Newly diagnosed RA/uncharacterized autoimmune disease, Felty Syndrome 5. Severe Refractory depression/Asthenia/Allodynia 6. 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 Anasarca, severe protein calorie malnutrition 8. PNA -LLL abnormality on CT-effusions 9. On TPN for malnutrition- trial of PANDA feeds led to high residuals and vomiting.  It is difficult because there is not one predominant untreatable terminal diagnosis that has a predictable trajectory, but more so the combination of ALL of her multiple acute problems on top of what was likely poorly managed chronic diseases (DM, HTN..)-she had no insurance and never saw doctors except for at urgent care.   I have discussed all of the above with her husband- he is grieving and having trouble accepting that we truly may be approaching EOL despite even the most  aggressive medical interventions which have been limited because of her bleeding, heart issues and infection. She can essentially not have any procedures.  Recommendations:  1. Would not recommend PEG - she couldn't tolerate PANDA feeding so a PEG wouldn't be indicated- and with her platlets it wouldn't be done likely- also high residuals and NV- suggests obstruction or intra abdominal process. I would however advocate for EGD to see if she has severe esophagitis that could be treated or even an outlet obstruction or malignancy- there would be value in knowing this information even if it just solidifies the need to transition to to full hospice.  2. Treat pain and all symptoms aggressively- resumed Duragesic 12.5. Added Modafinil daily to see if we can stimulate her energy-worth a try- she has severe asthenia and depression.  3. At this time would not recommend SNF for her care- she is on TPN, IV antibiotics, and is not taking in any PO for unknown etiology other than severe mouth sores and depression-does have persistent nausea of unknown etiology.  4. Decreased number of CBG sticks for her comfort  5. Once medical work up is complete and if there is still not a clear illness trajectory determined I will either recommend LTAC/sub-acute care if the patient/her husband's desire is to continue down a path or treatment ie. Antibiotics, rheumatology work up and platelet transfusion - I would also very much support a transition to full comfort care and a hospice facility- she is very fragile and critically ill.  Her labs are much worse today-albumin is now 1.8, platelets are now 47...discharge in this setting would not be advised and she would be promptly readmitted- she is at risk for active  and acute bleeding and is requiring a very high level of monitoring and interventions currently.  My hope is with more clarity on her condition and prognosis I can confidently and with much certainty prepare her  husband for her EOL and get her to a hospice facility. With at least another 24 hours of inpatient stay I feel certain I could achieve fully clarified goals and a plan moving forward that most likely will be hospice care and achieve resolution for this patient and her husband.  The alternative is prompt readmission and ongoing patient suffering.  Time: 90 minutes-care coordination Will follow closely.  Anderson Malta, DO Palliative Medicine [phone]

## 2014-04-02 NOTE — Progress Notes (Signed)
Able to preform a partial assessment on patient.  She is tearful and does not want me to touch her.  She does not want any medications, cream applied to back, mouth care, or dressing changes.  Husband at bedside and asked me to come back later.  Pt denies any pain and requests to be left alone to sleep.

## 2014-04-02 NOTE — Progress Notes (Signed)
PARENTERAL NUTRITION CONSULT NOTE - Follow up  Pharmacy Consult for TPN Indication: intolerance to enteral feeding  Allergies  Allergen Reactions  . Penicillins Rash    Patient Measurements: Height: 5' 4"  (162.6 cm) Weight: 209 lb 6.4 oz (94.983 kg) IBW/kg (Calculated) : 54.7  Vital Signs: Temp: 98.4 F (36.9 C) (12/17 0452) Temp Source: Oral (12/17 0452) BP: 151/82 mmHg (12/17 0452) Pulse Rate: 120 (12/17 0452) Intake/Output from previous day: 12/16 0701 - 12/17 0700 In: 846.7 [P.O.:120; I.V.:726.7] Out: 951 [Urine:950; Stool:1] Intake/Output from this shift:    Labs:  Recent Labs  03/31/14 0435 04/01/14 0613 04/02/14 0358  WBC 4.1 4.0 3.2*  HGB 8.9* 8.5* 7.5*  HCT 27.9* 26.5* 24.0*  PLT 64* 51* 47*  INR 1.29  --  1.30     Recent Labs  03/31/14 0435 04/01/14 0613 04/02/14 0358  NA 146 143 141  K 3.0* 3.4* 4.1  CL 113* 112 112  CO2 18* 18* 18*  GLUCOSE 113* 197* 229*  BUN 80* 82* 84*  CREATININE 1.55* 1.38* 1.35*  CALCIUM 7.7* 7.6* 7.5*  MG 2.1  2.2  --  1.9  PHOS 4.1  --  3.2  PROT 5.2* 5.2* 4.8*  ALBUMIN 2.3* 2.1* 1.8*  AST 26 26 33  ALT <5 <5 <5  ALKPHOS 260* 264* 295*  BILITOT 0.9 0.8 0.6   Estimated Creatinine Clearance: 49.5 mL/min (by C-G formula based on Cr of 1.35).    Recent Labs  04/02/14 0016 04/02/14 0450 04/02/14 0749  GLUCAP 221* 188* 197*    Medical History: Past Medical History  Diagnosis Date  . Hypertension   . Diabetes mellitus without complication   . Arthritis   . Coronary artery disease     pt unaware    Medications:  Scheduled:  . sodium chloride   Intravenous Once  . albumin human  12.5 g Intravenous Q6H  . antiseptic oral rinse  7 mL Mouth Rinse q12n4p  .  ceFAZolin (ANCEF) IV  2 g Intravenous Q8H  . chlorhexidine  15 mL Mouth Rinse BID  . feeding supplement (ENSURE COMPLETE)  237 mL Oral TID BM  . fentaNYL  12.5 mcg Transdermal Q72H  . fluconazole (DIFLUCAN) IV  200 mg Intravenous Q24H  .  hydrocortisone cream   Topical BID  . insulin aspart  0-20 Units Subcutaneous 6 times per day  . insulin glargine  12 Units Subcutaneous Daily  . magic mouthwash w/lidocaine  10 mL Oral TID  . methylPREDNISolone (SOLU-MEDROL) injection  40 mg Intravenous Daily  . metoprolol tartrate  12.5 mg Oral q morning - 10a  . mirtazapine  15 mg Oral QHS  . modafinil  100 mg Oral Daily  . nystatin  5 mL Oral QID  . OLANZapine zydis  5 mg Oral QHS  . ondansetron (ZOFRAN) IV  4 mg Intravenous 4 times per day  . pantoprazole (PROTONIX) IV  40 mg Intravenous Q12H  . phytonadione (VITAMIN K) IV  10 mg Intravenous q morning - 10a  . sodium bicarbonate  650 mg Oral TID  . sodium chloride  10-40 mL Intracatheter Q12H   Infusions:  . TPN (CLINIMIX) Adult without lytes 40 mL/hr at 04/01/14 1749   And  . fat emulsion 250 mL (04/01/14 1748)  . 0.9 % sodium chloride with kcl 50 mL/hr at 04/01/14 1743   PRN: acetaminophen **OR** acetaminophen, HYDROmorphone (DILAUDID) injection, [DISCONTINUED] ondansetron **OR** ondansetron (ZOFRAN) IV, sodium chloride   Insulin Requirements:   Lantus 12  units daily (started 12/12, previously 15 units daily 12/7-12/11)  Sensitive SSI: 24 units yesterday  Current Nutrition:  Ensure supplement TID, has been refusing  IVF:  NS 20mq KCl/L @ 571mhr started 12/16  Central access: PICC line placed 12/8 TPN start date: 12/13  ASSESSMENT                                                                                                          58 yoF admitted on 12/3 with fever, confusion, refusing care/PO intake, septic shock with recurrent MSSA bacteremia.  PICC line placed 12/8 for prolonged IV antibiotics.  She has severe deconditioning and severe protein calorie malnutrition; she has failed to advance tube feeds to goal and continues to complain of N/V with enteral intake.  MD to discuss PEG tube placement for long-term management, but may not be available until next week.   She has been started on steroids, olanzapine, mirtazapine, and prn anti-emetics to help with nausea and anorexia.  Pharmacy is consulted to dose TPN.  She is high risk for refeeding.  Significant events:  12/8 Pt refusing PO.  Pharmacy consulted for TPN, but NOT started d/t contraindication w/ functioning gut.  Pt and husband agree to try enteral feeding.   12/10 Glucerna tolerated at 20 ml/hr but had N/V when advanced to 30 ml/hr. 12/11 TF advanced to 40 ml/hr overnight, but pt had large volume emesis.  TF held, pt refusing all PO intake. 12/13 TPN initiated  Today, 04/02/2014:  Glucose - CBGs elevated since TPN initiation.  Currently on steroids, which can also elevate CBGs. Receiving Lantus and SSI.  Electrolytes - Phos 3.2, K 4.1  Mag 1.9, CorrCa WNL at 9.12.    NaBicarb tabs ordered TID, refused all 3 doses yesterday  Renal - SCr 1.35, elevated but improved from 1.55  LFTs - AST/ALT WNL, Tbili now WNL, Alk phos elevated  TGs - 65 (12/14)  Prealbumin: 11.5 (12/14) improved  Patient refused all oral medications yesterday and this am. Discussed with Dr. AbAvie Echevarias to continue TPN for now.  NUTRITIONAL GOALS                                                                                              RD recs (12/11): 1800-2000 kCal, 100-110 grams of protein per day  Clinimix 5/15 at a goal rate of 83 ml/hr + 20% fat emulsion at 10 ml/hr to provide: 100 g/day protein, 1894 Kcal/day.  PLAN  At 1800 today:  Change to  Clinimix 5/15 E .  Increase rate to 50m/hour   20% fat emulsion at 10 ml/hr.  TPN to contain standard multivitamins and trace elements.  Change IV fluids to NS @ 446m/hr  Continue Lantus 12 units daily.  Change SSI to resistant scale with continued blood sugar checks q4h.  TPN lab panels on Mondays & Thursdays.  BMet, mag and phos in  am  F/u daily , continue to watch for refeeding syndrome.   ElDolly RiasPh 04/02/2014, 12:15 PM Pager [phone]

## 2014-04-02 NOTE — Progress Notes (Addendum)
room  PROGRESS NOTE    Robin Schroeder PQD:[id number] DOB: 12-12-1955 DOA: 03/19/2014 PCP: No PCP Per Patient  HPI/Brief narrative 58 year old female patient with history of hypertension, obesity, DM, chronic diastolic CHF, possible rheumatoid arthritis, recent hospitalization for MSSA bacteremia of unknown source/neg TEE- cleared after Vancomycin (PCN allergy), went to SNF and then discharged from their to home, presented with complaints of not eating or drinking 4 days and husband was concerned that patient was trying to hurt herself and she was IVC and brought to ED where she was assessed as sepsis of unclear source and admitted to stepdown for further management. Patient went into septic shock requiring pressors. She also developed STEMI but poor overall candidate for aggressive intervention i.e. Currently. CCM, ID and cardiology assisting with care. Shock resolved- pressors DC'ed 12/7 AM.   Assessment/Plan:  1. Septic shock secondary to MSSA bacteremia: Continue cefazolin. Source not clear-? Status post arthrocentesis. Cell count from B/L knee synovial fluid consistent with inflammatory arthritis, culture shows MSSA from the left knee. Infectious disease follow-up appreciated. . Shock improved/resolved and DC'd pressors 12/7 AM. She has been off IV hydrocortisone . Currently on prednisone,  Fludrocortisone. As per ID, will eventually need repeat TEE when medically stable. Obtain MRI of left knee,foot ,ankle , attempt to do this under no sedation today.if not tolerated then Needs conscious sedation,. Surveillance blood cultures 2 (12/4): Negative to date.  PICC line  placed after ID  Approval.  Stop date would be 05/06/14.  Patient somewhat somnolent and therefore decrease fentanyl patch to 12.5.   2. ? Osteomyelitis of medial left knee on x-ray: ID follow-up appreciated. MRI of left knee when stable. Patient has not been stable enough to have this done .   3. Left lower lobe airspace disease:  Possibly chronic.? Doubt that the patient has pneumonia.    4.  hyponatremia: Resolved.    5. Inferiolateral wall STEMI: Patient has not complained of chest pain since admission. Troponins steadily increased to >20 on 12/3. Not candidate for aggressive intervention i.e. cath secondary to unstable status from shock, anemia and thrombocytopenia. Patient treated with 48 hours of IV heparin-DC'd 12/7 and was placed on aspirin-but DC due to bleeding . Will eventually need cardiac catheterization when stable. 2-D echo shows EF of 50-55% the distal inferior and apical hypokinesis that is new.. .  Cards will clear her for MRI as conscious sedation is needed, also determine timing of TEE .   6. IVC/?? Intention to self harm: IVC removed . Psychiatry consultation note reviewed . Discussed with Dr. Harrington Challenger 12/5. Patient has capacity to make her own medical decisions and living arrangement. Meds adjusted by psyche . No evidence of imminent risk to self. Sitter discontinued. Patient does have capacity for psych evaluation.   7. Anasarca: Secondary to ongoing illnesses, malnutrition and hypo-albuminemia.worsening , Treat underlying cause and attempt to improve nutritional status. Tube feeding held  because of nausea. Intolerant to tube feeding. Continue TPN until patient is discharged   8. Right sacral decubitus: Does not appear grossly infected. Wound care consultation requested.  9. Hypertension: Patient in septic shock. Shock is improving.  10. Type II DM: Uncontrolled. Likely worsened by steroids. Continue  Lantus-  continue SSI.   11. New diagnosis of rheumatoid arthritis: Patient was on prednisone at home. Patient placed on 20 mg of prednisone. Unable to tolerate by mouth medicines therefore changed to IV Solu-Medrol minimum dose of 40 mg. RA related pain was probably the reason she had been bed bound since recent DC  from SNF. Patient will have OP Rheumatology consultation .  12. Chronic diastolic CHF: Generalized anasarca but probably intravascularly dehydrated due to third spacing.  13. Hypokalemia: Replaced.   14. Anemia: Acute on chronic. Vaginal bleeding , splenomegaly on CT scan. ,  Baseline hemoglobin probably in the 9 g per DL range. Status post 1 unit PRBC 12/4 with appropriate response. Hemoglobin trending down, now with vaginal bleeding   , type and screen, DC asa and lovenox, hemoglobin stable. Patient has received a total of 3 units this admission. Hematology following... Recommend platelet transfusion if the patient is actively bleeding. INR improving with vitamin K injections.  Patient hemoglobin has dropped below her transfusion trigger of 8.0, given her recent history of STEMI. We'll transfuse 1 more unit of blood today  15. Thrombocytopenia: Possibly from acute illness/sepsis. 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 No suspicion for myelodysplasia. No indication for bone marrow biopsy. May have some underlying platelet dysfunction contribution to the bleeding. If profoundly bleeding please transfuse platelets.   16. Multiple pressure wounds: Details as per [place] nurse note on 12/4. Patient has left heel DTI, right arm full-thickness abrasion and unstageable right buttock wound. Reconsult wound care because of bleeding sacral wounds.   17. Failure to thrive: Multifactorial   18. Intractable nausea. Patient started on Protonix IV, discontinued prednisone by mouth, unable to tolerate tube feeding, discontinue Reglan. Concern about esophageal etiology. May benefit from a CT scan of  Chest and abdomen , may need conscious sedation, and IV contrast. Will proceed of the husband is agreeable. Dr. Hilma Favors to explain  The risk involved with acute renal failure , and the risk from a cardiac standpoint the setting of recent STEMI. I have requested Dr. Debara Pickett to see the patient would be safe to  undergo sedation     19. Severe deconditioning: Severe protein calorie malnutrition: Dietitian consulted. Did not tolerate enteral feeding , now on TPN. Discussed PEG tube placement with the husband. Do not anticipate that this will be done up until  after   cardiology has cleared patient for conscious sedation   20. Depressive disorder secondary to general medical condition: Management per psychiatry. zyprexa  added. I doubt that psychiatry has anything further to offer   21. oliguria renal failure-DC albumin,  IV fluids. Per nephrology. Appreciate nephrology input : Likely secondary to third spacing of fluids in the setting of severe protein calorie malnutrition and intravascular dehydration. . Creatinine stable for 3 days   22. E Coli UTI: Continue Ancef- sensitive.   23. Mild non-anion gap metabolic acidosis: Unclear etiology. Start by mouth bicarbonate. Follow BMP in a.m.  DVT: unable to tolerate locenox, scd's ,asa  Code Status: Limited code Family Communication:  Husband present by the bedside   Disposition Plan:  Possible discharge to SNF tomorrow   Patient does not want continued hospitalization and psychiatry (see note from 12/9) and myself feel that the patient has capacity to make her own decisions   Consultants:  Cardiology  ID  Psychiatry  PCCM-Signed off  Procedures:  Arterial line-discontinued  Foley catheter  Antibiotics:  IV Vanc 12/3>DC'd   IV Aztreonam 12/3> 12/4   IV Ancef.  Subjective:  patient refusing to be  touched, tender to touch  Everywhere, refusing to take pills  Objective: Filed Vitals:   04/01/14 0427 04/01/14 1408 04/01/14 2028 04/02/14 0452  BP: 135/73 140/78 146/73 151/82  Pulse: 91 95 115 120  Temp: 97.7 F (36.5 C) 98.1 F (36.7 C) 98.2 F (36.8 C) 98.4 F (36.9 C)  TempSrc: Axillary Oral Oral Oral  Resp: _0 Height:      Weight: 93.123 kg (205 lb 4.8 oz)   94.983 kg (209 lb 6.4 oz)  SpO2: 100% 98%  98% 95%   blood pressures actually better than listed bowel-as discussed with nursing systolic in the 782N and map 73   Intake/Output Summary (Last 24 hours) at 04/02/14 1151 Last data filed at 04/02/14 0458  Gross per 24 hour  Intake  682.5 ml  Output    951 ml  Net -268.5 ml   Filed Weights   03/30/14 2035 04/01/14 0427 04/02/14 0452  Weight: 94.394 kg (208 lb 1.6 oz) 93.123 kg (205 lb 4.8 oz) 94.983 kg (209 lb 6.4 oz)     Exam:  General exam: Pleasant middle-aged female, extremely dry oral mucosa, generalized anasarca Respiratory system: Slightly diminished breath sounds in the bases but otherwise clear to auscultation. No increased work of breathing. Cardiovascular system: S1 & S2 heard, RRR. No JVD, murmurs, gallops, clicks. Anasarca. Telemetry: SB in the 50s-SR. Gastrointestinal system: Abdomen is nondistended, soft and nontender. Normal bowel sounds heard. Patient has patchy ecchymosis over right upper quadrant. Central nervous system: Alert and oriented 3. No focal neurological deficits. Extremities: Symmetric 5 x 5 power. Diffuse anasarca Skin: Dry appearing right sacral decubitus without signs of overt infection. Patient has skin tear over the right posterior upper arm. Left heel DTI without signs of acute infection. Psychiatry: Pleasant and comfortable today. Denies suicidal ideations.   Data Reviewed: Basic Metabolic Panel:  Recent Labs Lab 03/29/14 1120 03/30/14 0400 03/31/14 0435 04/01/14 0613 04/02/14 0358  NA 145 146 146 143 141  K 3.6* 3.5* 3.0* 3.4* 4.1  CL 112 113* 113* 112 112  CO2 19 19 18* 18* 18*  GLUCOSE 77 167* 113* 197* 229*  BUN 67* 74* 80* 82* 84*  CREATININE 1.55* 1.63* 1.55* 1.38* 1.35*  CALCIUM 7.8* 8.0* 7.7* 7.6* 7.5*  MG 2.3 2.3 2.1  2.2  --  1.9  PHOS 4.7* 5.0* 4.1  --  3.2   Liver Function Tests:  Recent Labs Lab 03/29/14 1120 03/30/14 0400 03/31/14 0435 04/01/14 0613 04/02/14 0358  AST 35 _1 33  ALT <5 <5 <5 <5 <5    ALKPHOS 309* 245* 260* 264* 295*  BILITOT 1.3* 0.9 0.9 0.8 0.6  PROT 5.3* 5.1* 5.2* 5.2* 4.8*  ALBUMIN 2.4* 2.4* 2.3* 2.1* 1.8*   No results for input(s): LIPASE, AMYLASE in the last 168 hours. No results for input(s): AMMONIA in the last 168 hours. CBC:  Recent Labs Lab 03/29/14 1120 03/30/14 0400 03/31/14 0435 04/01/14 0613 04/02/14 0358  WBC 3.4* 3.4* 4.1 4.0 3.2*  NEUTROABS  --  2.9  --   --   --   HGB 8.7* 8.2* 8.9* 8.5* 7.5*  HCT 27.2* 25.2* 27.9* 26.5* 24.0*  MCV 88.0 89.0 90.3 91.1 92.3  PLT 74* 63* 64* 51* 47*   Cardiac Enzymes: No results for input(s): CKTOTAL, CKMB, CKMBINDEX, TROPONINI in the last 168 hours. BNP (  last 3 results) No results for input(s): PROBNP in the last 8760 hours. CBG:  Recent Labs Lab 04/01/14 1715 04/01/14 2031 04/02/14 0016 04/02/14 0450 04/02/14 0749  GLUCAP 223* 228* 221* 188* 197*    Recent Results (from the past 240 hour(s))  Body fluid culture     Status: None   Collection Time: 03/25/14  6:37 PM  Result Value Ref Range Status   Specimen Description SYNOVIAL RIGHT KNEE  Final   Special Requests NONE  Final   Gram Stain   Final    FEW WBC PRESENT,BOTH PMN AND MONONUCLEAR NO ORGANISMS SEEN Performed at [facility]    Culture   Final    NO GROWTH 3 DAYS Performed at [facility]    Report Status 03/29/2014 FINAL  Final  Body fluid culture     Status: None   Collection Time: 03/25/14  6:46 PM  Result Value Ref Range Status   Specimen Description SYNOVIAL LEFT KNEE  Final   Special Requests NONE  Final   Gram Stain   Final    ABUNDANT WBC PRESENT,BOTH PMN AND MONONUCLEAR NO ORGANISMS SEEN Performed at [facility]    Culture   Final    RARE STAPHYLOCOCCUS AUREUS Note: RIFAMPIN AND GENTAMICIN SHOULD NOT BE USED AS SINGLE DRUGS FOR TREATMENT OF STAPH INFECTIONS. CALLED TO KIMBERLY OKAFOR 03/30/14 1315 BY SMITHERSJ Performed at [facility]    Report Status 03/31/2014 FINAL  Final    Organism ID, Bacteria STAPHYLOCOCCUS AUREUS  Final      Susceptibility   Staphylococcus aureus - MIC*    CLINDAMYCIN <=0.25 SENSITIVE Sensitive     ERYTHROMYCIN <=0.25 SENSITIVE Sensitive     GENTAMICIN <=0.5 SENSITIVE Sensitive     LEVOFLOXACIN 0.25 SENSITIVE Sensitive     OXACILLIN 0.5 SENSITIVE Sensitive     PENICILLIN 0.12 SENSITIVE Sensitive     RIFAMPIN <=0.5 SENSITIVE Sensitive     TRIMETH/SULFA <=10 SENSITIVE Sensitive     VANCOMYCIN <=0.5 SENSITIVE Sensitive     TETRACYCLINE <=1 SENSITIVE Sensitive     MOXIFLOXACIN <=0.25 SENSITIVE Sensitive     * RARE STAPHYLOCOCCUS AUREUS         Studies: No results found.      Scheduled Meds: . antiseptic oral rinse  7 mL Mouth Rinse q12n4p  .  ceFAZolin (ANCEF) IV  2 g Intravenous Q8H  . chlorhexidine  15 mL Mouth Rinse BID  . feeding supplement (ENSURE COMPLETE)  237 mL Oral TID BM  . fentaNYL  12.5 mcg Transdermal Q72H  . fluconazole (DIFLUCAN) IV  200 mg Intravenous Q24H  . hydrocortisone cream   Topical BID  . insulin aspart  0-15 Units Subcutaneous 3 times per day  . insulin glargine  12 Units Subcutaneous Daily  . magic mouthwash w/lidocaine  10 mL Oral TID  . methylPREDNISolone (SOLU-MEDROL) injection  40 mg Intravenous Daily  . metoprolol tartrate  12.5 mg Oral q morning - 10a  . mirtazapine  15 mg Oral QHS  . nystatin  5 mL Oral QID  . OLANZapine zydis  5 mg Oral QHS  . ondansetron (ZOFRAN) IV  4 mg Intravenous 4 times per day  . pantoprazole (PROTONIX) IV  40 mg Intravenous Q12H  . phytonadione (VITAMIN K) IV  10 mg Intravenous q morning - 10a  . sodium bicarbonate  650 mg Oral TID  . sodium chloride  10-40 mL Intracatheter Q12H   Continuous Infusions: . TPN (CLINIMIX) Adult without lytes 40  mL/hr at 04/01/14 1749   And  . fat emulsion 250 mL (04/01/14 1748)  . 0.9 % sodium chloride with kcl 50 mL/hr at 04/01/14 1743    Active Problems:   DM2 (diabetes mellitus, type 2)   Physical deconditioning    Hypoalbuminemia   Hyponatremia   Severe sepsis   Acute UTI   Protein-calorie malnutrition, severe   Sacral decubitus ulcer   Acute pulmonary edema   Effusion into joint   ST elevation myocardial infarction (STEMI) of inferolateral wall, initial episode of care   Severe sepsis with septic shock   Diabetic ulcer of left foot associated with diabetes mellitus due to underlying condition   Osteomyelitis   Ulcer of heel   Acute osteomyelitis of femur   Septic arthritis of knee, left   STEMI (ST elevation myocardial infarction)   Ulcer of left heel   Metabolic acidosis   Bacteremia   Knee pain   Ileus   Nausea with vomiting   Palliative care encounter   Appetite loss    Time spent: 40 minutes.    Reyne Dumas, MD [facility] Pager [phone]  If 7PM-7AM, please contact night-coverage [email or web address] Password [email or web address] 04/02/2014, 11:51 AM    LOS: 14 days

## 2014-04-02 NOTE — Progress Notes (Signed)
Present for support with Jaquelinne as well as husband and physician during partial assessment.  Pt exhibiting anxiety / nervousness.  Initially welcoming of chaplain presence, stating, "Let's talk to the [place]."   While early in encounter, Robin Schroeder seemed somewhat comforted as husband described "chaplain is praying for you,"  she was increasingly tearful and, though none in room were speaking, she repeated that she wanted to be quiet and sleep.  She ultimately stated that chaplain presence was "making her nervous."   She seems to be very aware of bodily proximity.   During encounter, chaplain provided calm presence with goal of helping Robin Schroeder feel cared for in hospital environment.    [name]'s husband, Hanley Hays, is doing all he can to care for [name] in this place.  Chaplains will follow pt and family for support during admission.   Belva Crome MDiv

## 2014-04-02 NOTE — Evaluation (Signed)
Occupational Therapy Evaluation Patient Details Name: Robin Schroeder MRN: [id number] DOB: 11/09/1955 Today's Date: 04/02/2014    History of Present Illness pt. admitted As per patient's husband, patient did not want to eat or drink and she is not eating and drinking for past 5 days and so he got concerned that she wants to hurt herself. So he filled out IVC and patient was brought by the EMS. Patient was discharged from the nursing home about 2 weeks ago and has been nonambulatory. There has been has been providing care with feeding and diaper change when needed. Patient does have decubitus ulcersAlso with inferior wall STEMI.   Clinical Impression   Pt admitted with overall weakness . Pt currently with functional limitations due to the deficits listed below (see OT Problem List).  Pt will benefit from skilled OT to increase their safety and independence with ADL and functional mobility for ADL to facilitate discharge to venue listed below.   Follow Up Recommendations  SNF    Equipment Recommendations  None recommended by OT       Precautions / Restrictions Precautions Precaution Comments: nonambulatory      Mobility Bed Mobility Overal bed mobility: Needs Assistance             General bed mobility comments: refused  Transfers                      Balance      NT                                      ADL Overall ADL's : Needs assistance/impaired                                       General ADL Comments: pt total A at bed level with ADL activity      Vision   Perception   Praxis    Pertinent Vitals/Pain Pain Assessment: Faces Faces Pain Scale: Hurts even more Pain Location: BUE with ROM and repositioning Pain Intervention(s): Limited activity within patient's tolerance;Monitored during session;Repositioned     Hand Dominance     Extremity/Trunk Assessment Upper Extremity Assessment Upper Extremity Assessment:  Generalized weakness;RUE deficits/detail;LUE deficits/detail RUE Deficits / Details: pt did allow pt to move BUE and reposition on pillows as BUE very swollen.  Educated pt on importance of elevating/ positioning on pilliows. Pt agreed  RUE: Unable to fully assess due to pain LUE: Unable to fully assess due to pain              Cognition Arousal/Alertness: Lethargic Behavior During Therapy: Agitated Overall Cognitive Status: No family/caregiver present to determine baseline cognitive functioning                     General Comments    BUE very swollen! Propped on pillows. Encouraged pt to move BUE - pt only moved fingers           Home Living Family/patient expects to be discharged to:: Other (Comment) (hospice? )                                 Additional Comments: bedbound,  at [facility]   in recent past, non ambulatory,  Prior Functioning/Environment               OT Diagnosis: Generalized weakness;Acute pain   OT Problem List: Decreased strength;Decreased range of motion;Increased edema   OT Treatment/Interventions: Therapeutic activities;Therapeutic exercise    OT Goals(Current goals can be found in the care plan section) Acute Rehab OT Goals 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: did not state OT Goal Formulation: With patient Time For Goal Achievement: 04/16/14 Potential to Achieve Goals: Poor  OT Frequency:                End of Session Nurse Communication: Other (comment) (need for BUE to be elevated)  Activity Tolerance: Patient limited by pain;Treatment limited secondary to agitation Patient left: in bed;with call bell/phone within reach   Time: 1125-1147 OT Time Calculation (min): 22 min Charges:  OT General Charges $OT Visit: 1 Procedure OT Evaluation $Initial OT Evaluation Tier I: 1 Procedure OT Treatments $Therapeutic Activity: 8-22 mins G-Codes:    Einar Crow D 27-Apr-2014, 11:57 AM

## 2014-04-02 NOTE — Progress Notes (Signed)
Robin Schroeder   DOB:[birth year]   UR#:[id number]   EGB#:[id number]  Patient Care Team: No Pcp Per Patient as PCP - General (General Practice)  Subjective: Eyes closed, does not want to be disturbed. Ill appearing. She does not engage in conversation. Intermittent nausea, vomiting controlled. Afebrile. Denies chest pain. She has yeast in urine, placed on Diflucan.No other issues reported.    Scheduled Meds: . antiseptic oral rinse  7 mL Mouth Rinse q12n4p  .  ceFAZolin (ANCEF) IV  2 g Intravenous Q8H  . chlorhexidine  15 mL Mouth Rinse BID  . feeding supplement (ENSURE COMPLETE)  237 mL Oral TID BM  . fentaNYL  25 mcg Transdermal Q72H  . fluconazole (DIFLUCAN) IV  200 mg Intravenous Q24H  . fludrocortisone  0.1 mg Oral Daily  . hydrocortisone cream   Topical BID  . insulin aspart  0-15 Units Subcutaneous 6 times per day  . insulin glargine  12 Units Subcutaneous Daily  . magic mouthwash  10 mL Oral TID  . methylPREDNISolone (SOLU-MEDROL) injection  40 mg Intravenous Daily  . metoprolol tartrate  12.5 mg Oral q morning - 10a  . mirtazapine  15 mg Oral QHS  . OLANZapine zydis  5 mg Oral QHS  . pantoprazole (PROTONIX) IV  40 mg Intravenous Q12H  . phytonadione (VITAMIN K) IV  10 mg Intravenous q morning - 10a  . sodium bicarbonate  650 mg Oral TID  . sodium chloride  10-40 mL Intracatheter Q12H   Continuous Infusions: . TPN (CLINIMIX) Adult without lytes 40 mL/hr at 04/01/14 1749   And  . fat emulsion 250 mL (04/01/14 1748)  . 0.9 % sodium chloride with kcl 50 mL/hr at 04/01/14 1743   PRN Meds:acetaminophen **OR** acetaminophen, HYDROmorphone (DILAUDID) injection, ondansetron **OR** ondansetron (ZOFRAN) IV, sodium chloride   Objective:  Filed Vitals:   04/02/14 0452  BP: 151/82  Pulse: 120  Temp: 98.4 F (36.9 C)  Resp: 20      Intake/Output Summary (Last 24 hours) at 04/02/14 0858 Last data filed at 04/02/14 0458  Gross per 24 hour  Intake  682.5 ml  Output    951 ml   Net -268.5 ml    ECOG PERFORMANCE STATUS: 4  GENERAL:ill appearing patient is lethargic, responds to verbal and tactile stimuli SKIN: remarkable for petechiae and echymmoses on lower extremities, skin frail LUNGS: decreased breath sounds at the bases anteriorly. HEART: regular rate & rhythm and no murmurs and anasarca present ABDOMEN:abdomen soft, non-tender and decreased bowel sounds Musculoskeletal:no cyanosis of digits and no clubbing     CBG (last 3)   Recent Labs  04/02/14 0016 04/02/14 0450 04/02/14 0749  GLUCAP 221* 188* 197*     Labs:   Recent Labs Lab 03/29/14 1120 03/30/14 0400 03/31/14 0435 04/01/14 0613 04/02/14 0358  WBC 3.4* 3.4* 4.1 4.0 3.2*  HGB 8.7* 8.2* 8.9* 8.5* 7.5*  HCT 27.2* 25.2* 27.9* 26.5* 24.0*  PLT 74* 63* 64* 51* 47*  MCV 88.0 89.0 90.3 91.1 92.3  MCH 28.2 29.0 28.8 29.2 28.8  MCHC 32.0 32.5 31.9 32.1 31.3  RDW 18.6* 18.9* 19.1* 19.2* 19.0*  LYMPHSABS  --  0.3*  --   --   --   MONOABS  --  0.2  --   --   --   EOSABS  --  0.0  --   --   --   BASOSABS  --  0.0  --   --   --  Chemistries:    Recent Labs Lab 03/29/14 1120 03/30/14 0400 03/31/14 0435 04/01/14 0613 04/02/14 0358  NA 145 146 146 143 141  K 3.6* 3.5* 3.0* 3.4* 4.1  CL 112 113* 113* 112 112  CO2 19 19 18* 18* 18*  GLUCOSE 77 167* 113* 197* 229*  BUN 67* 74* 80* 82* 84*  CREATININE 1.55* 1.63* 1.55* 1.38* 1.35*  CALCIUM 7.8* 8.0* 7.7* 7.6* 7.5*  MG 2.3 2.3 2.1  2.2  --  1.9  AST 35 25 26 26  33  ALT <5 <5 <5 <5 <5  ALKPHOS 309* 245* 260* 264* 295*  BILITOT 1.3* 0.9 0.9 0.8 0.6    GFR Estimated Creatinine Clearance: 49.5 mL/min (by C-G formula based on Cr of 1.35).  Liver Function Tests:  Recent Labs Lab 03/29/14 1120 03/30/14 0400 03/31/14 0435 04/01/14 0613 04/02/14 0358  AST 35 25 26 26  33  ALT <5 <5 <5 <5 <5  ALKPHOS 309* 245* 260* 264* 295*  BILITOT 1.3* 0.9 0.9 0.8 0.6  PROT 5.3* 5.1* 5.2* 5.2* 4.8*  ALBUMIN 2.4* 2.4* 2.3* 2.1* 1.8*    No results for input(s): LIPASE, AMYLASE in the last 168 hours. No results for input(s): AMMONIA in the last 168 hours.  Urine Studies     Component Value Date/Time   COLORURINE AMBER* 03/27/2014 1721   APPEARANCEUR TURBID* 03/27/2014 1721   LABSPEC 1.021 03/27/2014 1721   PHURINE 5.0 03/27/2014 1721   GLUCOSEU NEGATIVE 03/27/2014 1721   HGBUR LARGE* 03/27/2014 1721   BILIRUBINUR NEGATIVE 03/27/2014 1721   KETONESUR NEGATIVE 03/27/2014 1721   PROTEINUR 100* 03/27/2014 1721   UROBILINOGEN 0.2 03/27/2014 1721   NITRITE NEGATIVE 03/27/2014 1721   LEUKOCYTESUR LARGE* 03/27/2014 1721    Coagulation profile  Recent Labs Lab 03/28/14 1445 03/31/14 0435 04/02/14 0358  INR 1.70* 1.29 1.30    Microbiology Staph aureus positive in left knee, yeast in urine   Imaging Studies:  No results found.  Assessment/Plan: 58 y.o.   Anemia Thrombocytopenia In the setting of acute on chronic illness, infection CT abdomen shows splenomegaly, which could be related to infection ASA and Lovenox on hold S/p transfusion of 2 units of blood on 12/13. baseline is 9. Smear unremarkable.   Transfuse platelets if less than 20k or if  patient is bleeding. She has been getting vit K to minimize her bleeding  New diagnosis of RA On IV solumedrol and pain meds as she is not able to tolerate oral meds  Septic Shock with MSSA bacteremia E Coli UTI Septic arthritis Cultures positive for Staph bacteremia Continue antibiotic therapy with Ancef and supportive care Appreciate CCM involvement  Inferolateral wall STEMI EF 50-55 % with  New distal inferior and apical hypokinesis per ECHO Appreciate Cards follow up  Anasarca In the setting of malnutrition Intolerant to tube feeding, switched to TPN Other medical issues as per admitting team  DVT prophylaxis On SCDs  Candidiasis On Diflucan Day 2  Code Status: Prognosis is poor. She is limited code. Discussions among [facility] team and  family ongoing.    Other medical issues, including oliguria of renal failure, depression, CHF, deconditioning, nausea, anasarca, etc  as per primary team.  1/14, PA-C 04/02/2014  8:58 AM    ADDENDUM: I agree with the above. This is just a very difficult situation. Her platelet count is dropping slowly but surely. There is some bleeding issues. Her hemoglobin is 7.5.  In better circumstances, I would say a bone marrow test would  be helpful. The problem is that if we found a malignancy associated with her bone marrow (i.e. lymphoma) I don't think we would be able to treat it because of her overall poor performance status.  I think that palliative care certainly is helpful.  I think that supportive transfusions for now would be reasonable.  One potential option for the thrombocytopenia might be Nplate.  We will continue to follow along.  Cindee Lame

## 2014-04-02 NOTE — Progress Notes (Addendum)
DAILY PROGRESS NOTE  Subjective:  No chest pain - seen by myself for STEMI almost 2 weeks ago (medically managed due to comorbidities). ?placement of PEG tube and perioperative risk assessment.  Objective:  Temp:  [98.1 F (36.7 C)-98.4 F (36.9 C)] 98.4 F (36.9 C) (12/17 0452) Pulse Rate:  [95-120] 120 (12/17 0452) Resp:  [19-20] 20 (12/17 0452) BP: (140-151)/(73-82) 151/82 mmHg (12/17 0452) SpO2:  [95 %-98 %] 95 % (12/17 0452) Weight:  [209 lb 6.4 oz (94.983 kg)] 209 lb 6.4 oz (94.983 kg) (12/17 0452) Weight change: 4 lb 1.6 oz (1.86 kg)  Intake/Output from previous day: 12/16 0701 - 12/17 0700 In: 846.7 [P.O.:120; I.V.:726.7] Out: 951 [Urine:950; Stool:1]  Intake/Output from this shift:    Medications: Current Facility-Administered Medications  Medication Dose Route Frequency Provider Last Rate Last Dose  . acetaminophen (TYLENOL) tablet 650 mg  650 mg Oral Q6H PRN Oswald Hillock, MD   650 mg at 03/22/14 2207   Or  . acetaminophen (TYLENOL) suppository 650 mg  650 mg Rectal Q6H PRN Oswald Hillock, MD      . antiseptic oral rinse (CPC / CETYLPYRIDINIUM CHLORIDE 0.05%) solution 7 mL  7 mL Mouth Rinse q12n4p Modena Jansky, MD   7 mL at 04/01/14 1534  . ceFAZolin (ANCEF) IVPB 2 g/50 mL premix  2 g Intravenous Q8H Mosetta Pigeon, RPH   2 g at 04/02/14 0459  . chlorhexidine (PERIDEX) 0.12 % solution 15 mL  15 mL Mouth Rinse BID Modena Jansky, MD   15 mL at 04/01/14 2132  . TPN (CLINIMIX) Adult without lytes   Intravenous Continuous TPN Angela Adam, RPH 40 mL/hr at 04/01/14 1749     And  . fat emulsion 20 % infusion 250 mL  250 mL Intravenous Continuous TPN Angela Adam, RPH 10 mL/hr at 04/01/14 1748 250 mL at 04/01/14 1748  . feeding supplement (ENSURE COMPLETE) (ENSURE COMPLETE) liquid 237 mL  237 mL Oral TID BM Hazle Coca, RD   237 mL at 03/30/14 1400  . fentaNYL (DURAGESIC - dosed mcg/hr) 12.5 mcg  12.5 mcg Transdermal Q72H Acquanetta Chain, DO        . fluconazole (DIFLUCAN) IVPB 200 mg  200 mg Intravenous Q24H Emiliano Dyer, RPH   200 mg at 04/01/14 2250  . hydrocortisone cream 1 %   Topical BID Reyne Dumas, MD      . HYDROmorphone (DILAUDID) injection 0.5-1 mg  0.5-1 mg Intravenous Q4H PRN Reyne Dumas, MD   0.5 mg at 04/02/14 0236  . insulin aspart (novoLOG) injection 0-15 Units  0-15 Units Subcutaneous 3 times per day Acquanetta Chain, DO      . insulin glargine (LANTUS) injection 12 Units  12 Units Subcutaneous Daily Reyne Dumas, MD   12 Units at 04/01/14 1742  . magic mouthwash w/lidocaine  10 mL Oral TID Acquanetta Chain, DO      . methylPREDNISolone sodium succinate (SOLU-MEDROL) 40 mg/mL injection 40 mg  40 mg Intravenous Daily Reyne Dumas, MD   40 mg at 04/02/14 1128  . metoprolol tartrate (LOPRESSOR) tablet 12.5 mg  12.5 mg Oral q morning - 10a Reyne Dumas, MD   12.5 mg at 04/01/14 1016  . mirtazapine (REMERON SOL-TAB) disintegrating tablet 15 mg  15 mg Oral QHS Durward Parcel, MD   15 mg at 03/31/14 2217  . nystatin (MYCOSTATIN) 100000 UNIT/ML suspension 500,000 Units  5 mL Oral QID Reyne Dumas,  MD   500,000 Units at 04/02/14 1128  . OLANZapine zydis (ZYPREXA) disintegrating tablet 5 mg  5 mg Oral QHS Durward Parcel, MD   5 mg at 03/31/14 2217  . ondansetron (ZOFRAN) injection 4 mg  4 mg Intravenous Q6H PRN Oswald Hillock, MD   4 mg at 03/29/14 0012  . ondansetron (ZOFRAN) injection 4 mg  4 mg Intravenous 4 times per day Acquanetta Chain, DO      . pantoprazole (PROTONIX) injection 40 mg  40 mg Intravenous Q12H Reyne Dumas, MD   40 mg at 04/02/14 1129  . phytonadione (VITAMIN K) 10 mg in dextrose 5 % 50 mL IVPB  10 mg Intravenous q morning - 10a Reyne Dumas, MD   10 mg at 04/02/14 1129  . sodium bicarbonate tablet 650 mg  650 mg Oral TID Modena Jansky, MD   650 mg at 03/31/14 2217  . sodium chloride 0.9 % 1,000 mL with potassium chloride 60 mEq infusion   Intravenous Continuous Reyne Dumas, MD 50 mL/hr at 04/01/14 1743    . sodium chloride 0.9 % injection 10-40 mL  10-40 mL Intracatheter Q12H Reyne Dumas, MD   10 mL at 03/31/14 2219  . sodium chloride 0.9 % injection 10-40 mL  10-40 mL Intracatheter PRN Reyne Dumas, MD   10 mL at 04/01/14 1638    Physical Exam: General appearance: alert and no distress Lungs: clear to auscultation bilaterally Heart: regular rate and rhythm, S1, S2 normal, no murmur, click, rub or gallop Extremities: significant RUE edema, arm is elevated  Lab Results: Results for orders placed or performed during the hospital encounter of 03/19/14 (from the past 48 hour(s))  Glucose, capillary     Status: Abnormal   Collection Time: 03/31/14 11:48 AM  Result Value Ref Range   Glucose-Capillary 163 (H) 70 - 99 mg/dL  Glucose, capillary     Status: Abnormal   Collection Time: 03/31/14  4:49 PM  Result Value Ref Range   Glucose-Capillary 263 (H) 70 - 99 mg/dL  Glucose, capillary     Status: Abnormal   Collection Time: 03/31/14  8:07 PM  Result Value Ref Range   Glucose-Capillary 317 (H) 70 - 99 mg/dL  Glucose, capillary     Status: Abnormal   Collection Time: 04/01/14 12:19 AM  Result Value Ref Range   Glucose-Capillary 247 (H) 70 - 99 mg/dL  Glucose, capillary     Status: Abnormal   Collection Time: 04/01/14  4:26 AM  Result Value Ref Range   Glucose-Capillary 172 (H) 70 - 99 mg/dL  CBC     Status: Abnormal   Collection Time: 04/01/14  6:13 AM  Result Value Ref Range   WBC 4.0 4.0 - 10.5 K/uL   RBC 2.91 (L) 3.87 - 5.11 MIL/uL   Hemoglobin 8.5 (L) 12.0 - 15.0 g/dL   HCT 26.5 (L) 36.0 - 46.0 %   MCV 91.1 78.0 - 100.0 fL   MCH 29.2 26.0 - 34.0 pg   MCHC 32.1 30.0 - 36.0 g/dL   RDW 19.2 (H) 11.5 - 15.5 %   Platelets 51 (L) 150 - 400 K/uL    Comment: CONSISTENT WITH PREVIOUS RESULT  Comprehensive metabolic panel     Status: Abnormal   Collection Time: 04/01/14  6:13 AM  Result Value Ref Range   Sodium 143 137 - 147 mEq/L   Potassium  3.4 (L) 3.7 - 5.3 mEq/L   Chloride 112 96 - 112 mEq/L  CO2 18 (L) 19 - 32 mEq/L   Glucose, Bld 197 (H) 70 - 99 mg/dL   BUN 82 (H) 6 - 23 mg/dL   Creatinine, Ser 1.38 (H) 0.50 - 1.10 mg/dL   Calcium 7.6 (L) 8.4 - 10.5 mg/dL   Total Protein 5.2 (L) 6.0 - 8.3 g/dL   Albumin 2.1 (L) 3.5 - 5.2 g/dL   AST 26 0 - 37 U/L   ALT <5 0 - 35 U/L   Alkaline Phosphatase 264 (H) 39 - 117 U/L   Total Bilirubin 0.8 0.3 - 1.2 mg/dL   GFR calc non Af Amer 41 (L) >90 mL/min   GFR calc Af Amer 47 (L) >90 mL/min    Comment: (NOTE) The eGFR has been calculated using the CKD EPI equation. This calculation has not been validated in all clinical situations. eGFR's persistently <90 mL/min signify possible Chronic Kidney Disease.    Anion gap 13 5 - 15  Glucose, capillary     Status: Abnormal   Collection Time: 04/01/14  7:32 AM  Result Value Ref Range   Glucose-Capillary 159 (H) 70 - 99 mg/dL  Glucose, capillary     Status: Abnormal   Collection Time: 04/01/14 11:40 AM  Result Value Ref Range   Glucose-Capillary 154 (H) 70 - 99 mg/dL  Glucose, capillary     Status: Abnormal   Collection Time: 04/01/14  5:15 PM  Result Value Ref Range   Glucose-Capillary 223 (H) 70 - 99 mg/dL  Glucose, capillary     Status: Abnormal   Collection Time: 04/01/14  8:31 PM  Result Value Ref Range   Glucose-Capillary 228 (H) 70 - 99 mg/dL   Comment 1 Documented in Chart    Comment 2 Notify RN   Glucose, capillary     Status: Abnormal   Collection Time: 04/02/14 12:16 AM  Result Value Ref Range   Glucose-Capillary 221 (H) 70 - 99 mg/dL   Comment 1 Documented in Chart    Comment 2 Notify RN   Magnesium     Status: None   Collection Time: 04/02/14  3:58 AM  Result Value Ref Range   Magnesium 1.9 1.5 - 2.5 mg/dL  Phosphorus     Status: None   Collection Time: 04/02/14  3:58 AM  Result Value Ref Range   Phosphorus 3.2 2.3 - 4.6 mg/dL  Comprehensive metabolic panel     Status: Abnormal   Collection Time: 04/02/14   3:58 AM  Result Value Ref Range   Sodium 141 137 - 147 mEq/L   Potassium 4.1 3.7 - 5.3 mEq/L    Comment: DELTA CHECK NOTED   Chloride 112 96 - 112 mEq/L   CO2 18 (L) 19 - 32 mEq/L   Glucose, Bld 229 (H) 70 - 99 mg/dL   BUN 84 (H) 6 - 23 mg/dL   Creatinine, Ser 1.35 (H) 0.50 - 1.10 mg/dL   Calcium 7.5 (L) 8.4 - 10.5 mg/dL   Total Protein 4.8 (L) 6.0 - 8.3 g/dL   Albumin 1.8 (L) 3.5 - 5.2 g/dL   AST 33 0 - 37 U/L   ALT <5 0 - 35 U/L   Alkaline Phosphatase 295 (H) 39 - 117 U/L   Total Bilirubin 0.6 0.3 - 1.2 mg/dL   GFR calc non Af Amer 42 (L) >90 mL/min   GFR calc Af Amer 48 (L) >90 mL/min    Comment: (NOTE) The eGFR has been calculated using the CKD EPI equation. This calculation has not  been validated in all clinical situations. eGFR's persistently <90 mL/min signify possible Chronic Kidney Disease.    Anion gap 11 5 - 15  Protime-INR     Status: Abnormal   Collection Time: 04/02/14  3:58 AM  Result Value Ref Range   Prothrombin Time 16.4 (H) 11.6 - 15.2 seconds   INR 1.30 0.00 - 1.49  CBC     Status: Abnormal   Collection Time: 04/02/14  3:58 AM  Result Value Ref Range   WBC 3.2 (L) 4.0 - 10.5 K/uL   RBC 2.60 (L) 3.87 - 5.11 MIL/uL   Hemoglobin 7.5 (L) 12.0 - 15.0 g/dL   HCT 24.0 (L) 36.0 - 46.0 %   MCV 92.3 78.0 - 100.0 fL   MCH 28.8 26.0 - 34.0 pg   MCHC 31.3 30.0 - 36.0 g/dL   RDW 19.0 (H) 11.5 - 15.5 %   Platelets 47 (L) 150 - 400 K/uL    Comment: CONSISTENT WITH PREVIOUS RESULT  Lactate dehydrogenase     Status: Abnormal   Collection Time: 04/02/14  3:58 AM  Result Value Ref Range   LDH 341 (H) 94 - 250 U/L  Glucose, capillary     Status: Abnormal   Collection Time: 04/02/14  4:50 AM  Result Value Ref Range   Glucose-Capillary 188 (H) 70 - 99 mg/dL   Comment 1 Documented in Chart    Comment 2 Notify RN   Glucose, capillary     Status: Abnormal   Collection Time: 04/02/14  7:49 AM  Result Value Ref Range   Glucose-Capillary 197 (H) 70 - 99 mg/dL     Imaging: No results found.  Assessment:  1. Active Problems: 2.   DM2 (diabetes mellitus, type 2) 3.   Physical deconditioning 4.   Hypoalbuminemia 5.   Hyponatremia 6.   Severe sepsis 7.   Acute UTI 8.   Protein-calorie malnutrition, severe 9.   Sacral decubitus ulcer 10.   Acute pulmonary edema 11.   Effusion into joint 12.   ST elevation myocardial infarction (STEMI) of inferolateral wall, initial episode of care 13.   Severe sepsis with septic shock 14.   Diabetic ulcer of left foot associated with diabetes mellitus due to underlying condition 15.   Osteomyelitis 16.   Ulcer of heel 17.   Acute osteomyelitis of femur 18.   Septic arthritis of knee, left 19.   STEMI (ST elevation myocardial infarction) 20.   Ulcer of left heel 21.   Metabolic acidosis 22.   Bacteremia 23.   Knee pain 24.   Ileus 25.   Nausea with vomiting 26.   Palliative care encounter 27.   Appetite loss 28.   Plan:  1. High risk for recurrent MI perioperatively with PEG tube placement. Is PEG tube consistent with her goals of care? She is not having angina. Deemed not to be a cardiac catheterization candidate - I agree with continued medical therapy. We have consistently stated that she is not a candidate for TEE - would recommend long-term antibiotic therapy as per ID.     No further suggestions from a cardiac standpoint regarding this patient.  Time Spent Directly with Patient:  15 minutes  Length of Stay:  LOS: 14 days   Pixie Casino, MD, [facility] Attending Cardiologist [facility]  , C 04/02/2014, 11:47 AM

## 2014-04-02 NOTE — Progress Notes (Signed)
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 Penicillins Rash    Patient Measurements: Height:  (162.6 cm) Weight: 209 lb 6.4 oz (94.983 kg) IBW/kg (Calculated) : 54.7  Vital Signs: Temp: 98.4 F (36.9 C) (12/17 0452) Temp Source: Oral (12/17 0452) BP: 151/82 mmHg (12/17 0452) Pulse Rate: 120 (12/17 0452) Intake/Output from previous day: 12/16 0701 - 12/17 0700 In: 846.7 [P.O.:120; I.V.:726.7] Out: 951 [Urine:950; Stool:1] Intake/Output from this shift:    Labs:  Recent Labs  03/31/14 0435 04/01/14 0613 04/02/14 0358  WBC 4.1 4.0 3.2*  HGB 8.9* 8.5* 7.5*  PLT 64* 51* 47*  CREATININE 1.55* 1.38* 1.35*   Estimated Creatinine Clearance: 49.5 mL/min (by C-G formula based on Cr of 1.35). No results for input(s): VANCOTROUGH, VANCOPEAK, VANCORANDOM, GENTTROUGH, GENTPEAK, GENTRANDOM, TOBRATROUGH, TOBRAPEAK, TOBRARND, AMIKACINPEAK, AMIKACINTROU, AMIKACIN in the last 72 hours.   Microbiology: Recent Results (from the past 720 hour(s))  Urine culture     Status: None   Collection Time: 03/19/14  3:04 PM  Result Value Ref Range Status   Specimen Description URINE, CATHETERIZED  Final   Special Requests NONE  Final   Culture  Setup Time   Final    03/20/2014 00:29 Performed at [facility] Count   Final    >=100,000 COLONIES/ML Performed at [facility]    Culture   Final    ESCHERICHIA COLI Note: Two isolates with different morphologies were identified as the same organism.The most resistant organism was reported. Performed at [facility]    Report Status 03/23/2014 FINAL  Final   Organism ID, Bacteria ESCHERICHIA COLI  Final      Susceptibility   Escherichia coli - MIC*    AMPICILLIN >=32 RESISTANT Resistant     CEFAZOLIN <=4 SENSITIVE Sensitive     CEFTRIAXONE <=1 SENSITIVE  Sensitive     CIPROFLOXACIN <=0.25 SENSITIVE Sensitive     GENTAMICIN <=1 SENSITIVE Sensitive     LEVOFLOXACIN <=0.12 SENSITIVE Sensitive     NITROFURANTOIN <=16 SENSITIVE Sensitive     TOBRAMYCIN <=1 SENSITIVE Sensitive     TRIMETH/SULFA >=320 RESISTANT Resistant     PIP/TAZO <=4 SENSITIVE Sensitive     * ESCHERICHIA COLI  Blood Culture (routine x 2)     Status: None   Collection Time: 03/19/14  3:23 PM  Result Value Ref Range Status   Specimen Description BLOOD RIGHT WRIST  Final   Special Requests BOTTLES DRAWN AEROBIC AND ANAEROBIC 5 ML  Final   Culture  Setup Time   Final    03/20/2014 07:48 Performed at [facility]    Culture   Final    STAPHYLOCOCCUS AUREUS Note: RIFAMPIN AND GENTAMICIN SHOULD NOT BE USED AS SINGLE DRUGS FOR TREATMENT OF STAPH INFECTIONS. Note: Gram Stain Report Called to,Read Back By and Verified With: FAYE S BY INGRAM A 03/20/14 11AM Performed at [facility]    Report Status 03/22/2014 FINAL  Final   Organism ID, Bacteria STAPHYLOCOCCUS AUREUS  Final      Susceptibility   Staphylococcus aureus - MIC*    CLINDAMYCIN <=0.25 SENSITIVE Sensitive     ERYTHROMYCIN <=0.25 SENSITIVE Sensitive     GENTAMICIN <=0.5 SENSITIVE Sensitive     LEVOFLOXACIN <=0.12 SENSITIVE Sensitive     OXACILLIN 0.5 SENSITIVE Sensitive     PENICILLIN RESISTANT  RIFAMPIN <=0.5 SENSITIVE Sensitive     TRIMETH/SULFA <=10 SENSITIVE Sensitive     VANCOMYCIN <=0.5 SENSITIVE Sensitive     TETRACYCLINE <=1 SENSITIVE Sensitive     MOXIFLOXACIN <=0.25 SENSITIVE Sensitive     * STAPHYLOCOCCUS AUREUS  Blood Culture (routine x 2)     Status: None   Collection Time: 03/19/14  3:23 PM  Result Value Ref Range Status   Specimen Description BLOOD BLOOD LEFT FOREARM  Final   Special Requests BOTTLES DRAWN AEROBIC AND ANAEROBIC 5 ML  Final   Culture  Setup Time   Final    03/20/2014 07:51 Performed at [facility]    Culture   Final    STAPHYLOCOCCUS  AUREUS Note: SUSCEPTIBILITIES PERFORMED ON PREVIOUS CULTURE WITHIN THE LAST 5 DAYS. Note: Gram Stain Report Called to,Read Back By and Verified With: FAYE S BY INGRAM A 03/20/14 11AM Performed at [facility]    Report Status 03/22/2014 FINAL  Final  Culture, blood (routine x 2)     Status: None   Collection Time: 03/20/14  5:30 PM  Result Value Ref Range Status   Specimen Description BLOOD RIGHT HAND  Final   Special Requests BOTTLES DRAWN AEROBIC ONLY 8CC  Final   Culture  Setup Time   Final    03/21/2014 01:05 Performed at [facility]    Culture   Final    NO GROWTH 5 DAYS Performed at [facility]    Report Status 03/27/2014 FINAL  Final  Culture, blood (routine x 2)     Status: None   Collection Time: 03/20/14  5:37 PM  Result Value Ref Range Status   Specimen Description BLOOD LEFT ARM  Final   Special Requests   Final    BOTTLES DRAWN AEROBIC ONLY BLUE BOTTLE 9 CC, RED BOTTLE 1 CC   Culture  Setup Time   Final    03/21/2014 01:06 Performed at [facility]    Culture   Final    NO GROWTH 5 DAYS Performed at [facility]    Report Status 03/27/2014 FINAL  Final  Body fluid culture     Status: None   Collection Time: 03/25/14  6:37 PM  Result Value Ref Range Status   Specimen Description SYNOVIAL RIGHT KNEE  Final   Special Requests NONE  Final   Gram Stain   Final    FEW WBC PRESENT,BOTH PMN AND MONONUCLEAR NO ORGANISMS SEEN Performed at [facility]    Culture   Final    NO GROWTH 3 DAYS Performed at [facility]    Report Status 03/29/2014 FINAL  Final  Body fluid culture     Status: None   Collection Time: 03/25/14  6:46 PM  Result Value Ref Range Status   Specimen Description SYNOVIAL LEFT KNEE  Final   Special Requests NONE  Final   Gram Stain   Final    ABUNDANT WBC PRESENT,BOTH PMN AND MONONUCLEAR NO ORGANISMS SEEN Performed at [facility]    Culture   Final    RARE STAPHYLOCOCCUS  AUREUS Note: RIFAMPIN AND GENTAMICIN SHOULD NOT BE USED AS SINGLE DRUGS FOR TREATMENT OF STAPH INFECTIONS. CALLED TO KIMBERLY OKAFOR 03/30/14 1315 BY SMITHERSJ Performed at [facility]    Report Status 03/31/2014 FINAL  Final   Organism ID, Bacteria STAPHYLOCOCCUS AUREUS  Final      Susceptibility   Staphylococcus aureus - MIC*    CLINDAMYCIN <=0.25 SENSITIVE Sensitive  ERYTHROMYCIN <=0.25 SENSITIVE Sensitive     GENTAMICIN <=0.5 SENSITIVE Sensitive     LEVOFLOXACIN 0.25 SENSITIVE Sensitive     OXACILLIN 0.5 SENSITIVE Sensitive     PENICILLIN 0.12 SENSITIVE Sensitive     RIFAMPIN <=0.5 SENSITIVE Sensitive     TRIMETH/SULFA <=10 SENSITIVE Sensitive     VANCOMYCIN <=0.5 SENSITIVE Sensitive     TETRACYCLINE <=1 SENSITIVE Sensitive     MOXIFLOXACIN <=0.25 SENSITIVE Sensitive     * RARE STAPHYLOCOCCUS AUREUS   Assessment: 44 yoF with RA and recent MSSA bacteremia 10/13, blood cx cleared on 10/16. Some notes claim pt placed on cefazolin 2gm Q8hr, however can only find that patient was on vancomycin per Epic records. Noted TEE on 10/19 , negative for vegetations. She was to finish 14 day course on 10/30 using 10/16 as day 1. She was discharged to SNF for IV abx and rehab. On 12/3 pt brought to ED for IVC as pt refusing care.  Patient was started on empiric vancomycin; changed to cefazolin with blood cx showing MSSA.  Also with E. Coli UTI sens to cefazolin.  Noted pt allergy to PCN, but has tolerated ceftriaxone and cefazolin well.  Per ID, to complete 6 wks cefazolin 2g IV q8h using start date 03/25/14, with source of recurrent bacteremia maybe due to L knee septic arthritis.  Will need TEE when stable.  MRI of L knee, foot, ankle scheduled for today.  12/3 >> Vancomycin >> 12/6 12/3 >> Aztreonam >>12/4 12/3 >> Levaquin x 1 dose 12/5 >> Cefazolin >> 12/16 >>fluconazole>>  Tmax: 99 WBC: 3.4 Renal: SCr 1.35, CrCl ~ 42mL/min CG Lactate: 2.53 > 1.6  12/3 blood x2: 2/2 MSSA  (R=PCN only) 12/3 urine: >100k GNR - Ecoli - resistant to amp and Bactrim only 12/4 acute hepatitis panel: all negative 12/4 HIV Ab: none reactive 12/4 blood x2: NGTD 12/9 Synovial fluid (R knee): NGF 12/9 Synovial fluid (L knee): rare Staph aureus  Goal of Therapy:  treatment of infection, adjustment per renal function   Plan:   Continue Cefazolin 2g IV q8h.  Dose remains appropriate for renal function.  Per ID, treating for 6 weeks, with start date of 03/25/14-therefore, today will be day 9/42.  Continue to monitor renal function, cultures, clinical progress.    Arley Phenix RPh 04/02/2014, 11:28 AM Pager [phone]

## 2014-04-02 NOTE — Progress Notes (Signed)
Pt's fentanyl patch removed per MD order. Patch removed with MD witness.  Ernst Breach, RN witnessed waste of patch in sharps.

## 2014-04-02 NOTE — Progress Notes (Signed)
Patient refused oral contrast for CT scan of abdomen. Dr. Susie Cassette notified. CT aware. Will continue to monitor.

## 2014-04-02 NOTE — Evaluation (Signed)
Physical Therapy Evaluation Patient Details Name: YISELL SPRUNGER MRN: [id number] DOB: 1956-02-17 Today's Date: 04/02/2014   History of Present Illness  58 yo female adm 03/19/14 with multiple medical problems, sepsis; she had  recent hospital admission in October and was D/C'd to SNF, then home;  she has been non-ambulatory and immobilie for an unspecified but apparent lengthy period of time; PMHx: decubitus ulcers, HTN, DM, CHF,  ?RA, obesity  Clinical Impression  Pt seen for second time (re-eval) this admission; Pt was placed on PT caseload for a TRIAL on 03/23/14 and was unwilling to participate during any subsequent attempts; she screams repeatedly during re-eval today, even before any attempt is made to move; she is oriented to self and place; She repeatedly states "Goodbye" to PT dismissing therapist and  stating she wants to be "left alone to sleep" She was non-ambulatory/bed bound and per chart dependent in ADLS (feeding, incontinent, etc) --therefore she IS NOT a rehab candidate at this time; PT WILL sign off;    Follow Up Recommendations No PT follow up    Equipment Recommendations  None recommended by PT    Recommendations for Other Services       Precautions / Restrictions Precautions Precaution Comments: nonambulatory      Mobility  Bed Mobility Overal bed mobility: Needs Assistance  pt refuses attempt           General bed mobility comments: refused any mobility, pt  bed bound, non-ambulatory prior to adm  Transfers                    Ambulation/Gait                Stairs            Wheelchair Mobility    Modified Rankin (Stroke Patients Only)       Balance                                             Pertinent Vitals/Pain Pain Assessment: Faces Faces Pain Scale: Hurts worst Pain Location: pt screams in pain with any attempt toward moving any of her extremities,  or joints; she screams with light touch to  UEsLEs, she screams when covers are pulled back and LEs are not being touched Pain Intervention(s): Limited activity within patient's tolerance;Monitored during session;Repositioned    Home Living Family/patient expects to be discharged to:: Unsure (no family present at time of eval) Living Arrangements: Spouse/significant other               Additional Comments: bedbound,  at [facility]   in recent past, non ambulatory,  immobile,per chart was not even feeding herself prior to this adm    Prior Function           Comments: see above     Hand Dominance        Extremity/Trunk Assessment   Upper Extremity Assessment: Generalized weakness;RUE deficits/detail;LUE deficits/detail RUE Deficits / Details: pt screams with BIl UE ROM with PT RUE: Unable to fully assess due to pain         RLE Deficits / Details: would not participate for testing; RLE in ER and knee flexion LLE Deficits / Details: unable to test, pt unwilling to allow testing     Communication      Cognition Arousal/Alertness: Awake/alert Behavior During Therapy: Restless;Agitated Overall  Cognitive Status: No family/caregiver present to determine baseline cognitive functioning Area of Impairment: Problem solving;Awareness;Safety/judgement         Safety/Judgement: Decreased awareness of safety;Decreased awareness of deficits     General Comments: pt is oriented to self and place, she will not answer further questions from PT; she appears to have diminished inisght into her deficits and she can't be reasoned with at this time with regard to mobility/therapy    General Comments General comments (skin integrity, edema, etc.): pt with dimished muscle tone throughout extremeties; unsure if mildly edematous vs body habitus    Exercises        Assessment/Plan    PT Assessment Patent does not need any further PT services  PT Diagnosis Generalized weakness   PT Problem List    PT Treatment  Interventions     PT Goals (Current goals can be found in the Care Plan section) Acute Rehab PT Goals 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: did not state PT Goal Formulation: All assessment and education complete, DC therapy Potential to Achieve Goals: Poor    Frequency     Barriers to discharge        Co-evaluation               End of Session   Activity Tolerance: Patient limited by pain Patient left: in bed;with call bell/phone within reach           Time: 1152-1202 PT Time Calculation (min) (ACUTE ONLY): 10 min   Charges:   PT Evaluation $PT Re-evaluation: 1 Procedure     PT G Codes:          Epifanio Labrador 04/28/2014, 12:30 PM

## 2014-04-02 NOTE — Progress Notes (Signed)
Pt currently refusing for this RN to administer anything orally.  Attempted several times to give pt her medications, oral mouthwashes. Also currently refusing albumin administration.  MD paged to make aware.  Will offer albumin again before charting "not given" on MAR.

## 2014-04-02 NOTE — Progress Notes (Signed)
Patient allowed nursing to give blood and albumin. Pt refused oral contrast for CT. Will still do CT with IV contrast only. Family and MD aware.   Pt needs encouragement to take all meds. Husband wants [place] to continue to encourgae and explain why meds are needed. She is refusing oral meds but is allowing [place] to give her everything IV. Will relay to night shift RN.

## 2014-04-02 NOTE — Progress Notes (Signed)
ANTIBIOTIC CONSULT NOTE - FOLLOW UP  Pharmacy Consult for Daptomycin Indication: recurrent MSSA bacteremia, Staph aureus septic arthritis of L knee, E.coli UTI  Allergies  Allergen Reactions  . Penicillins Rash    Patient Measurements: Height: 5\' 4"  (162.6 cm) Weight: 209 lb 6.4 oz (94.983 kg) IBW/kg (Calculated) : 54.7  Vital Signs: Temp: 99.4 F (37.4 C) (12/17 1650) Temp Source: Oral (12/17 1650) BP: 123/56 mmHg (12/17 1650) Pulse Rate: 85 (12/17 1650) Intake/Output from previous day: 12/16 0701 - 12/17 0700 In: 846.7 [P.O.:120; I.V.:726.7] Out: 951 [Urine:950; Stool:1] Intake/Output from this shift: Total I/O In: 666.7 [I.V.:554.2; Blood:12.5; IV Piggyback:100] Out: -   Labs:  Recent Labs  03/31/14 0435 04/01/14 0613 04/02/14 0358  WBC 4.1 4.0 3.2*  HGB 8.9* 8.5* 7.5*  PLT 64* 51* 47*  CREATININE 1.55* 1.38* 1.35*   Estimated Creatinine Clearance: 49.5 mL/min (by C-G formula based on Cr of 1.35). No results for input(s): VANCOTROUGH, VANCOPEAK, VANCORANDOM, GENTTROUGH, GENTPEAK, GENTRANDOM, TOBRATROUGH, TOBRAPEAK, TOBRARND, AMIKACINPEAK, AMIKACINTROU, AMIKACIN in the last 72 hours.   Microbiology: Recent Results (from the past 720 hour(s))  Urine culture     Status: None   Collection Time: 03/19/14  3:04 PM  Result Value Ref Range Status   Specimen Description URINE, CATHETERIZED  Final   Special Requests NONE  Final   Culture  Setup Time   Final    03/20/2014 00:29 Performed at [facility] Count   Final    >=100,000 COLONIES/ML Performed at [facility]    Culture   Final    ESCHERICHIA COLI Note: Two isolates with different morphologies were identified as the same organism.The most resistant organism was reported. Performed at [facility]    Report Status 03/23/2014 FINAL  Final   Organism ID, Bacteria ESCHERICHIA COLI  Final      Susceptibility   Escherichia coli - MIC*    AMPICILLIN >=32 RESISTANT  Resistant     CEFAZOLIN <=4 SENSITIVE Sensitive     CEFTRIAXONE <=1 SENSITIVE Sensitive     CIPROFLOXACIN <=0.25 SENSITIVE Sensitive     GENTAMICIN <=1 SENSITIVE Sensitive     LEVOFLOXACIN <=0.12 SENSITIVE Sensitive     NITROFURANTOIN <=16 SENSITIVE Sensitive     TOBRAMYCIN <=1 SENSITIVE Sensitive     TRIMETH/SULFA >=320 RESISTANT Resistant     PIP/TAZO <=4 SENSITIVE Sensitive     * ESCHERICHIA COLI  Blood Culture (routine x 2)     Status: None   Collection Time: 03/19/14  3:23 PM  Result Value Ref Range Status   Specimen Description BLOOD RIGHT WRIST  Final   Special Requests BOTTLES DRAWN AEROBIC AND ANAEROBIC 5 ML  Final   Culture  Setup Time   Final    03/20/2014 07:48 Performed at [facility]    Culture   Final    STAPHYLOCOCCUS AUREUS Note: RIFAMPIN AND GENTAMICIN SHOULD NOT BE USED AS SINGLE DRUGS FOR TREATMENT OF STAPH INFECTIONS. Note: Gram Stain Report Called to,Read Back By and Verified With: FAYE S BY INGRAM A 03/20/14 11AM Performed at [facility]    Report Status 03/22/2014 FINAL  Final   Organism ID, Bacteria STAPHYLOCOCCUS AUREUS  Final      Susceptibility   Staphylococcus aureus - MIC*    CLINDAMYCIN <=0.25 SENSITIVE Sensitive     ERYTHROMYCIN <=0.25 SENSITIVE Sensitive     GENTAMICIN <=0.5 SENSITIVE Sensitive     LEVOFLOXACIN <=0.12 SENSITIVE Sensitive     OXACILLIN 0.5  SENSITIVE Sensitive     PENICILLIN RESISTANT      RIFAMPIN <=0.5 SENSITIVE Sensitive     TRIMETH/SULFA <=10 SENSITIVE Sensitive     VANCOMYCIN <=0.5 SENSITIVE Sensitive     TETRACYCLINE <=1 SENSITIVE Sensitive     MOXIFLOXACIN <=0.25 SENSITIVE Sensitive     * STAPHYLOCOCCUS AUREUS  Blood Culture (routine x 2)     Status: None   Collection Time: 03/19/14  3:23 PM  Result Value Ref Range Status   Specimen Description BLOOD BLOOD LEFT FOREARM  Final   Special Requests BOTTLES DRAWN AEROBIC AND ANAEROBIC 5 ML  Final   Culture  Setup Time   Final    03/20/2014  07:51 Performed at [facility]    Culture   Final    STAPHYLOCOCCUS AUREUS Note: SUSCEPTIBILITIES PERFORMED ON PREVIOUS CULTURE WITHIN THE LAST 5 DAYS. Note: Gram Stain Report Called to,Read Back By and Verified With: FAYE S BY INGRAM A 03/20/14 11AM Performed at [facility]    Report Status 03/22/2014 FINAL  Final  Culture, blood (routine x 2)     Status: None   Collection Time: 03/20/14  5:30 PM  Result Value Ref Range Status   Specimen Description BLOOD RIGHT HAND  Final   Special Requests BOTTLES DRAWN AEROBIC ONLY 8CC  Final   Culture  Setup Time   Final    03/21/2014 01:05 Performed at [facility]    Culture   Final    NO GROWTH 5 DAYS Performed at [facility]    Report Status 03/27/2014 FINAL  Final  Culture, blood (routine x 2)     Status: None   Collection Time: 03/20/14  5:37 PM  Result Value Ref Range Status   Specimen Description BLOOD LEFT ARM  Final   Special Requests   Final    BOTTLES DRAWN AEROBIC ONLY BLUE BOTTLE 9 CC, RED BOTTLE 1 CC   Culture  Setup Time   Final    03/21/2014 01:06 Performed at [facility]    Culture   Final    NO GROWTH 5 DAYS Performed at [facility]    Report Status 03/27/2014 FINAL  Final  Body fluid culture     Status: None   Collection Time: 03/25/14  6:37 PM  Result Value Ref Range Status   Specimen Description SYNOVIAL RIGHT KNEE  Final   Special Requests NONE  Final   Gram Stain   Final    FEW WBC PRESENT,BOTH PMN AND MONONUCLEAR NO ORGANISMS SEEN Performed at [facility]    Culture   Final    NO GROWTH 3 DAYS Performed at [facility]    Report Status 03/29/2014 FINAL  Final  Body fluid culture     Status: None   Collection Time: 03/25/14  6:46 PM  Result Value Ref Range Status   Specimen Description SYNOVIAL LEFT KNEE  Final   Special Requests NONE  Final   Gram Stain   Final    ABUNDANT WBC PRESENT,BOTH PMN AND MONONUCLEAR NO ORGANISMS  SEEN Performed at [facility]    Culture   Final    RARE STAPHYLOCOCCUS AUREUS Note: RIFAMPIN AND GENTAMICIN SHOULD NOT BE USED AS SINGLE DRUGS FOR TREATMENT OF STAPH INFECTIONS. CALLED TO KIMBERLY OKAFOR 03/30/14 1315 BY SMITHERSJ Performed at [facility]    Report Status 03/31/2014 FINAL  Final   Organism ID, Bacteria STAPHYLOCOCCUS AUREUS  Final      Susceptibility  Staphylococcus aureus - MIC*    CLINDAMYCIN <=0.25 SENSITIVE Sensitive     ERYTHROMYCIN <=0.25 SENSITIVE Sensitive     GENTAMICIN <=0.5 SENSITIVE Sensitive     LEVOFLOXACIN 0.25 SENSITIVE Sensitive     OXACILLIN 0.5 SENSITIVE Sensitive     PENICILLIN 0.12 SENSITIVE Sensitive     RIFAMPIN <=0.5 SENSITIVE Sensitive     TRIMETH/SULFA <=10 SENSITIVE Sensitive     VANCOMYCIN <=0.5 SENSITIVE Sensitive     TETRACYCLINE <=1 SENSITIVE Sensitive     MOXIFLOXACIN <=0.25 SENSITIVE Sensitive     * RARE STAPHYLOCOCCUS AUREUS   Assessment: 57 yoF with RA and recent MSSA bacteremia 10/13, blood cx cleared on 10/16. Some notes claim pt placed on cefazolin 2gm Q8hr, however can only find that patient was on vancomycin per Epic records. Noted TEE on 10/19 , negative for vegetations. She was to finish 14 day course on 10/30 using 10/16 as day 1. She was discharged to SNF for IV abx and rehab. On 12/3 pt brought to ED for IVC as pt refusing care.  Patient was started on empiric vancomycin; changed to cefazolin with blood cx showing MSSA.  Also with E. Coli UTI sens to cefazolin.  Noted pt allergy to PCN, but has tolerated ceftriaxone and cefazolin well.  Per ID, to complete 6 wks cefazolin 2g IV q8h using start date 03/25/14, with source of recurrent bacteremia maybe due to L knee septic arthritis.  Will need TEE when stable.  MRI of L knee, foot, ankle scheduled for today.    12/17: Antibiotics changed from Ancef to Daptomycin per ID due to pancytopenia possibly 2/2 Ancef.   12/3 >> Vancomycin >> 12/6 12/3 >> Aztreonam  >>12/4 12/3 >> Levaquin x 1 dose 12/5 >> Cefazolin >> 12/17 12/16 >>fluconazole>> 12/17 >> Daptomycin >>  Tmax: 99 WBC: 3.4 Renal: SCr 1.35, CrCl ~ 74mL/min CG Lactate: 2.53 > 1.6  12/3 blood x2: 2/2 MSSA (R=PCN only) 12/3 urine: >100k GNR - Ecoli - resistant to amp and Bactrim only 12/4 acute hepatitis panel: all negative 12/4 HIV Ab: none reactive 12/4 blood x2: NGTD 12/9 Synovial fluid (R knee): NGF 12/9 Synovial fluid (L knee): rare Staph aureus  Goal of Therapy:  treatment of infection, adjustment per renal function   Plan:   Start Daptomycin 500mg  IV q24h   Check baseline and weekly CK  Continue to monitor renal function, cultures, clinical progress.  , PharmD, BCPS 04/02/2014, 5:36 PM  Pager: [phone]

## 2014-04-03 DIAGNOSIS — B9629 Other Escherichia coli [E. coli] as the cause of diseases classified elsewhere: Secondary | ICD-10-CM

## 2014-04-03 DIAGNOSIS — D649 Anemia, unspecified: Secondary | ICD-10-CM

## 2014-04-03 DIAGNOSIS — B370 Candidal stomatitis: Secondary | ICD-10-CM

## 2014-04-03 LAB — TYPE AND SCREEN
ABO/RH(D): A POS
ANTIBODY SCREEN: NEGATIVE
Unit division: 0

## 2014-04-03 LAB — BASIC METABOLIC PANEL
ANION GAP: 12 (ref 5–15)
BUN: 88 mg/dL — ABNORMAL HIGH (ref 6–23)
CALCIUM: 8 mg/dL — AB (ref 8.4–10.5)
CHLORIDE: 116 meq/L — AB (ref 96–112)
CO2: 17 meq/L — AB (ref 19–32)
Creatinine, Ser: 1.31 mg/dL — ABNORMAL HIGH (ref 0.50–1.10)
GFR calc Af Amer: 50 mL/min — ABNORMAL LOW (ref >90)
GFR calc non Af Amer: 43 mL/min — ABNORMAL LOW (ref >90)
GLUCOSE: 263 mg/dL — AB (ref 70–99)
POTASSIUM: 4.2 meq/L (ref 3.7–5.3)
SODIUM: 145 meq/L (ref 137–147)

## 2014-04-03 LAB — CBC
HCT: 25.5 % — ABNORMAL LOW (ref 36.0–46.0)
HEMOGLOBIN: 8.1 g/dL — AB (ref 12.0–15.0)
MCH: 29.1 pg (ref 26.0–34.0)
MCHC: 31.8 g/dL (ref 30.0–36.0)
MCV: 91.7 fL (ref 78.0–100.0)
Platelets: 46 10*3/uL — ABNORMAL LOW (ref 150–400)
RBC: 2.78 MIL/uL — ABNORMAL LOW (ref 3.87–5.11)
RDW: 18.5 % — ABNORMAL HIGH (ref 11.5–15.5)
WBC: 2.9 10*3/uL — AB (ref 4.0–10.5)

## 2014-04-03 LAB — MAGNESIUM: MAGNESIUM: 1.9 mg/dL (ref 1.5–2.5)

## 2014-04-03 LAB — GLUCOSE, CAPILLARY
GLUCOSE-CAPILLARY: 211 mg/dL — AB (ref 70–99)
GLUCOSE-CAPILLARY: 205 mg/dL — AB (ref 70–99)
GLUCOSE-CAPILLARY: 206 mg/dL — AB (ref 70–99)
Glucose-Capillary: 233 mg/dL — ABNORMAL HIGH (ref 70–99)
Glucose-Capillary: 257 mg/dL — ABNORMAL HIGH (ref 70–99)
Glucose-Capillary: 265 mg/dL — ABNORMAL HIGH (ref 70–99)

## 2014-04-03 LAB — PHOSPHORUS: PHOSPHORUS: 3.5 mg/dL (ref 2.3–4.6)

## 2014-04-03 MED ORDER — PANTOPRAZOLE SODIUM 40 MG IV SOLR
40.0000 mg | INTRAVENOUS | Status: DC
  Administered 2014-04-04 – 2014-04-06 (×3): 40 mg via INTRAVENOUS
  Filled 2014-04-03 (×3): qty 40

## 2014-04-03 MED ORDER — DIAZEPAM 5 MG/ML IJ SOLN
5.0000 mg | INTRAMUSCULAR | Status: DC | PRN
  Administered 2014-04-05: 5 mg via INTRAVENOUS
  Filled 2014-04-03 (×2): qty 2

## 2014-04-03 MED ORDER — DIAZEPAM 5 MG/ML IJ SOLN
5.0000 mg | Freq: Every day | INTRAMUSCULAR | Status: DC
  Administered 2014-04-03 – 2014-04-05 (×3): 5 mg via INTRAVENOUS
  Filled 2014-04-03 (×3): qty 2

## 2014-04-03 MED ORDER — FAT EMULSION 20 % IV EMUL
250.0000 mL | INTRAVENOUS | Status: DC
  Filled 2014-04-03: qty 250

## 2014-04-03 MED ORDER — TRACE MINERALS CR-CU-F-FE-I-MN-MO-SE-ZN IV SOLN
INTRAVENOUS | Status: DC
  Filled 2014-04-03: qty 2000

## 2014-04-03 MED ORDER — OXYMETAZOLINE HCL 0.05 % NA SOLN
1.0000 | Freq: Two times a day (BID) | NASAL | Status: DC
  Administered 2014-04-03 – 2014-04-06 (×4): 1 via NASAL
  Filled 2014-04-03: qty 15

## 2014-04-03 NOTE — Progress Notes (Signed)
PARENTERAL NUTRITION CONSULT NOTE - Follow up  Pharmacy Consult for TPN Indication: intolerance to enteral feeding  Allergies  Allergen Reactions  . Penicillins Rash    Patient Measurements: Height: _0  (162.6 cm) Weight: 208 lb 12.8 oz (94.711 kg) IBW/kg (Calculated) : 54.7  Vital Signs: Temp: 98.4 F (36.9 C) (12/18 0507) Temp Source: Oral (12/18 0507) BP: 128/60 mmHg (12/18 0507) Pulse Rate: 84 (12/18 0507) Intake/Output from previous day: 12/17 0701 - 12/18 0700 In: 1646.7 [I.V.:1074.2; Blood:262.5; IV Piggyback:310] Out: 475 [Urine:475] Intake/Output from this shift:    Labs:  Recent Labs  04/01/14 0613 04/02/14 0358 04/02/14 2000 04/03/14 0500  WBC 4.0 3.2*  --  2.9*  HGB 8.5* 7.5* 9.0* 8.1*  HCT 26.5* 24.0* 28.7* 25.5*  PLT 51* 47*  --  46*  INR  --  1.30  --   --      Recent Labs  04/01/14 0613 04/02/14 0358 04/03/14 0500  NA 143 141 145  K 3.4* 4.1 4.2  CL 112 112 116*  CO2 18* 18* 17*  GLUCOSE 197* 229* 263*  BUN 82* 84* 88*  CREATININE 1.38* 1.35* 1.31*  CALCIUM 7.6* 7.5* 8.0*  MG  --  1.9 1.9  PHOS  --  3.2 3.5  PROT 5.2* 4.8*  --   ALBUMIN 2.1* 1.8*  --   AST 26 33  --   ALT <5 <5  --   ALKPHOS 264* 295*  --   BILITOT 0.8 0.6  --    Estimated Creatinine Clearance: 51 mL/min (by C-G formula based on Cr of 1.31).    Recent Labs  04/03/14 0026 04/03/14 0404 04/03/14 0733  GLUCAP 265* 233* 211*    Medical History: Past Medical History  Diagnosis Date  . Hypertension   . Diabetes mellitus without complication   . Arthritis   . Coronary artery disease     pt unaware    Medications:  Scheduled:  . antiseptic oral rinse  7 mL Mouth Rinse q12n4p  . chlorhexidine  15 mL Mouth Rinse BID  . DAPTOmycin (CUBICIN)  IV  500 mg Intravenous Q24H  . feeding supplement (ENSURE COMPLETE)  237 mL Oral TID BM  . fentaNYL  12.5 mcg Transdermal Q72H  . fluconazole (DIFLUCAN) IV  200 mg Intravenous Q24H  . hydrocortisone cream    Topical BID  . insulin aspart  0-20 Units Subcutaneous 6 times per day  . insulin glargine  12 Units Subcutaneous Daily  . magic mouthwash w/lidocaine  10 mL Oral TID  . methylPREDNISolone (SOLU-MEDROL) injection  40 mg Intravenous Daily  . metoprolol tartrate  12.5 mg Oral q morning - 10a  . mirtazapine  15 mg Oral QHS  . modafinil  100 mg Oral Daily  . nystatin  5 mL Oral QID  . OLANZapine zydis  5 mg Oral QHS  . ondansetron (ZOFRAN) IV  4 mg Intravenous 4 times per day  . pantoprazole (PROTONIX) IV  40 mg Intravenous Q12H  . sodium bicarbonate  650 mg Oral TID  . sodium chloride  10-40 mL Intracatheter Q12H   Infusions:  . sodium chloride 40 mL/hr at 04/02/14 1800  . Marland KitchenTPN (CLINIMIX-E) Adult 60 mL/hr at 04/02/14 1739   And  . fat emulsion 250 mL (04/02/14 1738)   PRN: acetaminophen **OR** acetaminophen, HYDROmorphone (DILAUDID) injection, [DISCONTINUED] ondansetron **OR** ondansetron (ZOFRAN) IV, sodium chloride   Insulin Requirements:   Lantus 12 units daily (started 12/12, previously 15 units daily 12/7-12/11)  Sensitive  SSI: 29 units yesterday  Current Nutrition:  Ensure supplement TID, has been refusing  IVF:  NS @ 1m/hr   Central access: PICC line placed 12/8 TPN start date: 12/13  ASSESSMENT                                                                                                          [id number] admitted on 12/3 with fever, confusion, refusing care/PO intake, septic shock with recurrent MSSA bacteremia.  PICC line placed 12/8 for prolonged IV antibiotics.  She has severe deconditioning and severe protein calorie malnutrition; she has failed to advance tube feeds to goal and continues to complain of N/V with enteral intake.  MD to discuss PEG tube placement for long-term management, but may not be available until next week.  She has been started on steroids, olanzapine, mirtazapine, and prn anti-emetics to help with nausea and anorexia.  Pharmacy is consulted to  dose TPN.  She is high risk for refeeding.  Significant events:  12/8 Pt refusing PO.  Pharmacy consulted for TPN, but NOT started d/t contraindication w/ functioning gut.  Pt and husband agree to try enteral feeding.   12/10 Glucerna tolerated at 20 ml/hr but had N/V when advanced to 30 ml/hr. 12/11 TF advanced to 40 ml/hr overnight, but pt had large volume emesis.  TF held, pt refusing all PO intake. 12/13 TPN initiated  Today, 04/03/2014:  Glucose - CBGs elevated since TPN initiation.  Currently on steroids, which can also elevate CBGs. Receiving Lantus and SSI.  Electrolytes - Phos 3.5, K 4.2  Mag 1.9, CorrCa WNL at 9.8    NaBicarb tabs ordered TID, refused all 3 doses yesterday  Renal - SCr 1.3, elevated but stable  LFTs - AST/ALT WNL, Tbili now WNL, Alk phos elevated  TGs - 65 (12/14)  Prealbumin: 11.5 (12/14) improved  Patient refused all oral medications yesterday and this am.   NUTRITIONAL GOALS                                                                                              RD recs (12/11): 1800-2000 kCal, 100-110 grams of protein per day  Clinimix 5/15 at a goal rate of 83 ml/hr + 20% fat emulsion at 10 ml/hr to provide: 100 g/day protein, 1894 Kcal/day.  PLAN  At 1800 today:  Continue  Clinimix 5/15 E at 64m/hour   20% fat emulsion at 10 ml/hr.  TPN to contain standard multivitamins and trace elements.  Continue IV fluids to NS @ 419m/hr  Add 30 Units Insulin (15 Units/L), patient will receive ~21.6 Units or less due to bag adherence  Continue Lantus 12 units daily.  Continue SSI to resistant scale with continued blood sugar checks q4h.  BMet, mag and phos with am labs  TPN lab panels on Mondays & Thursdays.  F/u daily , continue to watch for refeeding syndrome.   ElDolly RiasPh 04/03/2014, 11:38 AM Pager  [phone]
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
## 2014-04-03 NOTE — Progress Notes (Signed)
Robin Schroeder   DOB:[birth year]   FA#:[id number]   ION#:[id number]  Patient Care Team: No Pcp Per Patient as PCP - General (General Practice)  Subjective: Eyes closed, does not want to be disturbed. Ill appearing. She does not engage in conversation. Intermittent nausea, vomiting controlled. Afebrile. Denies chest pain. She continues to have bleeding issues. Agreed with supportive transfusion.  Scheduled Meds: . antiseptic oral rinse  7 mL Mouth Rinse q12n4p  . chlorhexidine  15 mL Mouth Rinse BID  . DAPTOmycin (CUBICIN)  IV  500 mg Intravenous Q24H  . feeding supplement (ENSURE COMPLETE)  237 mL Oral TID BM  . fentaNYL  12.5 mcg Transdermal Q72H  . fluconazole (DIFLUCAN) IV  200 mg Intravenous Q24H  . hydrocortisone cream   Topical BID  . insulin aspart  0-20 Units Subcutaneous 6 times per day  . insulin glargine  12 Units Subcutaneous Daily  . magic mouthwash w/lidocaine  10 mL Oral TID  . methylPREDNISolone (SOLU-MEDROL) injection  40 mg Intravenous Daily  . metoprolol tartrate  12.5 mg Oral q morning - 10a  . mirtazapine  15 mg Oral QHS  . modafinil  100 mg Oral Daily  . nystatin  5 mL Oral QID  . OLANZapine zydis  5 mg Oral QHS  . ondansetron (ZOFRAN) IV  4 mg Intravenous 4 times per day  . pantoprazole (PROTONIX) IV  40 mg Intravenous Q12H  . sodium bicarbonate  650 mg Oral TID  . sodium chloride  10-40 mL Intracatheter Q12H   Continuous Infusions: . sodium chloride 40 mL/hr at 04/02/14 1800  . Marland KitchenTPN (CLINIMIX-E) Adult 60 mL/hr at 04/02/14 1739   And  . fat emulsion 250 mL (04/02/14 1738)   PRN Meds:acetaminophen **OR** acetaminophen, HYDROmorphone (DILAUDID) injection, [DISCONTINUED] ondansetron **OR** ondansetron (ZOFRAN) IV, sodium chloride   Objective:  Filed Vitals:   04/03/14 0507  BP: 128/60  Pulse: 84  Temp: 98.4 F (36.9 C)  Resp: 20      Intake/Output Summary (Last 24 hours) at 04/03/14 1054 Last data filed at 04/03/14 0700  Gross per 24 hour  Intake  1646.67 ml  Output    475 ml  Net 1171.67 ml    ECOG PERFORMANCE STATUS: 4  GENERAL:ill appearing patient is lethargic, responds to verbal and tactile stimuli SKIN: remarkable for petechiae and echymmoses on lower extremities, skin frail LUNGS: decreased breath sounds at the bases anteriorly. HEART: regular rate & rhythm and no murmurs and anasarca present ABDOMEN:abdomen soft, non-tender and decreased bowel sounds Musculoskeletal:no cyanosis of digits and no clubbing     CBG (last 3)   Recent Labs  04/03/14 0026 04/03/14 0404 04/03/14 0733  GLUCAP 265* 233* 211*     Labs:   Recent Labs Lab 03/30/14 0400 03/31/14 0435 04/01/14 0613 04/02/14 0358 04/02/14 2000 04/03/14 0500  WBC 3.4* 4.1 4.0 3.2*  --  2.9*  HGB 8.2* 8.9* 8.5* 7.5* 9.0* 8.1*  HCT 25.2* 27.9* 26.5* 24.0* 28.7* 25.5*  PLT 63* 64* 51* 47*  --  46*  MCV 89.0 90.3 91.1 92.3  --  91.7  MCH 29.0 28.8 29.2 28.8  --  29.1  MCHC 32.5 31.9 32.1 31.3  --  31.8  RDW 18.9* 19.1* 19.2* 19.0*  --  18.5*  LYMPHSABS 0.3*  --   --   --   --   --   MONOABS 0.2  --   --   --   --   --   EOSABS 0.0  --   --   --   --   --  BASOSABS 0.0  --   --   --   --   --      Chemistries:    Recent Labs Lab 03/29/14 1120 03/30/14 0400 03/31/14 0435 04/01/14 0613 04/02/14 0358 04/03/14 0500  NA 145 146 146 143 141 145  K 3.6* 3.5* 3.0* 3.4* 4.1 4.2  CL 112 113* 113* 112 112 116*  CO2 19 19 18* 18* 18* 17*  GLUCOSE 77 167* 113* 197* 229* 263*  BUN 67* 74* 80* 82* 84* 88*  CREATININE 1.55* 1.63* 1.55* 1.38* 1.35* 1.31*  CALCIUM 7.8* 8.0* 7.7* 7.6* 7.5* 8.0*  MG 2.3 2.3 2.1  2.2  --  1.9 1.9  AST 35 25 26 26  33  --   ALT <5 <5 <5 <5 <5  --   ALKPHOS 309* 245* 260* 264* 295*  --   BILITOT 1.3* 0.9 0.9 0.8 0.6  --     GFR Estimated Creatinine Clearance: 51 mL/min (by C-G formula based on Cr of 1.31).  Liver Function Tests:  Recent Labs Lab 03/29/14 1120 03/30/14 0400 03/31/14 0435 04/01/14 0613  04/02/14 0358  AST 35 25 26 26  33  ALT <5 <5 <5 <5 <5  ALKPHOS 309* 245* 260* 264* 295*  BILITOT 1.3* 0.9 0.9 0.8 0.6  PROT 5.3* 5.1* 5.2* 5.2* 4.8*  ALBUMIN 2.4* 2.4* 2.3* 2.1* 1.8*   No results for input(s): LIPASE, AMYLASE in the last 168 hours. No results for input(s): AMMONIA in the last 168 hours.  Urine Studies     Component Value Date/Time   COLORURINE AMBER* 03/27/2014 1721   APPEARANCEUR TURBID* 03/27/2014 1721   LABSPEC 1.021 03/27/2014 1721   PHURINE 5.0 03/27/2014 1721   GLUCOSEU NEGATIVE 03/27/2014 1721   HGBUR LARGE* 03/27/2014 1721   BILIRUBINUR NEGATIVE 03/27/2014 1721   KETONESUR NEGATIVE 03/27/2014 1721   PROTEINUR 100* 03/27/2014 1721   UROBILINOGEN 0.2 03/27/2014 1721   NITRITE NEGATIVE 03/27/2014 1721   LEUKOCYTESUR LARGE* 03/27/2014 1721    Coagulation profile  Recent Labs Lab 03/28/14 1445 03/31/14 0435 04/02/14 0358  INR 1.70* 1.29 1.30    Microbiology Staph aureus positive in left knee, yeast in urine   Imaging Studies:  Ct Chest W Contrast  04/03/2014   CLINICAL DATA:  Esophageal abnormality an unintentional weight loss.  EXAM: CT CHEST, ABDOMEN, AND PELVIS WITH CONTRAST  TECHNIQUE: Multidetector CT imaging of the chest, abdomen and pelvis was performed following the standard protocol during bolus administration of intravenous contrast.  CONTRAST:  91m OMNIPAQUE IOHEXOL 300 MG/ML  SOLN  COMPARISON:  None.  FINDINGS: CT CHEST FINDINGS  THORACIC INLET/BODY WALL:  Anasarca as noted previously. Negative thyroid. There is a right upper extremity PICC with tip at the SVC.  MEDIASTINUM:  Mild cardiomegaly. There is a trace pericardial effusion which is stable. Coronary atherosclerosis is present. No acute vascular findings. No lymphadenopathy. There is fluid right of the distal esophagus, herniated to the hiatus.  LUNG WINDOWS:  Unchanged small pleural effusions, left greater than right. There is complete collapse of the left lower lobe. Atelectasis  in the right lower lobe is subsegmental. No edema or definitive pneumonia.  OSSEOUS:  See below.  CT ABDOMEN AND PELVIS FINDINGS  BODY WALL: Anasarca  Liver: No focal abnormality.  Biliary: The gallbladder is distended and mildly high density. Reportedly the patient is on TPN in this is likely from fasting state. There is no definitive surrounding inflammation when accounting for ascites. No report of abdominal pain to suggest cholecystitis.  A gallstone is again noted.  Pancreas: Edema around the pancreas is likely related to the patient's generalized edematous state rather than pancreatitis.  Spleen: Splenic enlargement, measuring up to 14 cm in maximal axial dimension. There are fairly circumscribed subcapsular and roughly wedge-shaped perfusion defects compatible with infarcts. No indication of rupture.  Adrenals: Unremarkable.  Kidneys and ureters: No hydronephrosis or stone.  Bladder: Decompressed by Foley catheter  Reproductive: 2 uterine fibroids, measuring up to 4.5 cm on the left. The left uterine fibroid deforms the endometrium at the level of the uterine horn.  Bowel: There is a prominent volume of rectal stool, similar to previous. Mesorectal fat infiltration could be from pressure changes or the edematous state. No evidence of bowel obstruction or perforation. Negative appendix.  Retroperitoneum: No mass or adenopathy.  Peritoneum: Ascites which appears simple but increased, now moderate volume.  Vascular: No acute abnormality.  OSSEOUS: There is a peripherally enhancing fluid collection around the greater trochanter on the right which is unchanged from previous. There is a small volume of fluid extending posteriorly around the proximal right femur, which may be new from prior or not imaged previously.  Bridging osteophytes throughout the thoracic and upper lumbar spine.  IMPRESSION: 1. No definitive malignancy within the chest, abdomen, or pelvis. 2. Splenomegaly with multiple infarcts, unchanged from  03/28/2014. 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 3. Volume overload with anasarca, pleural effusions, and ascites. Ascites is increased over the past 5 days, now moderate. 4. Progressive atelectasis, now with complete collapse of the left lower lobe. 5. Probable rectal impaction. 6. Unchanged fluid collection around the right greater trochanter, likely bursitis. 7. Cholelithiasis. Gallbladder distention is likely from fasting state in this patient on TPN.   Electronically Signed   By: Jorje Guild M.D.   On: 04/03/2014 04:00   Ct Abdomen Pelvis W Contrast  04/03/2014   CLINICAL DATA:  Esophageal abnormality an unintentional weight loss.  EXAM: CT CHEST, ABDOMEN, AND PELVIS WITH CONTRAST  TECHNIQUE: Multidetector CT imaging of the chest, abdomen and pelvis was performed following the standard protocol during bolus administration of intravenous contrast.  CONTRAST:  23m OMNIPAQUE IOHEXOL 300 MG/ML  SOLN  COMPARISON:  None.  FINDINGS: CT CHEST FINDINGS  THORACIC INLET/BODY WALL:  Anasarca as noted previously. Negative thyroid. There is a right upper extremity PICC with tip at the SVC.  MEDIASTINUM:  Mild cardiomegaly. There is a trace pericardial effusion which is stable. Coronary atherosclerosis is present. No acute vascular findings. No lymphadenopathy. There is fluid right of the distal esophagus, herniated to the hiatus.  LUNG WINDOWS:  Unchanged small pleural effusions, left greater than right. There is complete collapse of the left lower lobe. Atelectasis in the right lower lobe is subsegmental. No edema or definitive pneumonia.  OSSEOUS:  See below.  CT ABDOMEN AND PELVIS FINDINGS  BODY WALL: Anasarca  Liver: No focal abnormality.  Biliary: The gallbladder is distended and mildly high density. Reportedly the patient is on TPN in this is likely from fasting state. There is no definitive surrounding  inflammation when accounting for ascites. No report of abdominal pain to suggest cholecystitis. A gallstone is again noted.  Pancreas: Edema around the pancreas is likely related to the patient's generalized edematous state rather than pancreatitis.  Spleen: Splenic enlargement, measuring up to 14 cm in maximal axial dimension. There are fairly circumscribed subcapsular and roughly wedge-shaped perfusion defects compatible with infarcts. No indication  of rupture.  Adrenals: Unremarkable.  Kidneys and ureters: No hydronephrosis or stone.  Bladder: Decompressed by Foley catheter  Reproductive: 2 uterine fibroids, measuring up to 4.5 cm on the left. The left uterine fibroid deforms the endometrium at the level of the uterine horn.  Bowel: There is a prominent volume of rectal stool, similar to previous. Mesorectal fat infiltration could be from pressure changes or the edematous state. No evidence of bowel obstruction or perforation. Negative appendix.  Retroperitoneum: No mass or adenopathy.  Peritoneum: Ascites which appears simple but increased, now moderate volume.  Vascular: No acute abnormality.  OSSEOUS: There is a peripherally enhancing fluid collection around the greater trochanter on the right which is unchanged from previous. There is a small volume of fluid extending posteriorly around the proximal right femur, which may be new from prior or not imaged previously.  Bridging osteophytes throughout the thoracic and upper lumbar spine.  IMPRESSION: 1. No definitive malignancy within the chest, abdomen, or pelvis. 2. Splenomegaly with multiple infarcts, unchanged from 03/28/2014. 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 3. Volume overload with anasarca, pleural effusions, and ascites. Ascites is increased over the past 5 days, now moderate. 4. Progressive atelectasis, now with complete collapse of the left lower lobe. 5.  Probable rectal impaction. 6. Unchanged fluid collection around the right greater trochanter, likely bursitis. 7. Cholelithiasis. Gallbladder distention is likely from fasting state in this patient on TPN.   Electronically Signed   By: Jorje Guild M.D.   On: 04/03/2014 04:00    Assessment/Plan: 58 y.o.   Anemia Thrombocytopenia In the setting of acute on chronic illness, infection, splenomegaly per CT CT abdomen shows splenomegaly, which could be related to infection ASA and Lovenox on hold S/p transfusion of 1 unit on 12/4, 2 units of blood on 12/13 and 1 unit on 12/17. baseline is 9. Smear unremarkable.   Transfuse platelets if less than 20k or if  patient is bleeding. Nplpate being entertained. She has been getting vit K to minimize her bleeding No n=bone marrow biopsy is indicated at this time  New diagnosis of RA On IV solumedrol and pain meds as she is not able to tolerate oral meds  Septic Shock with MSSA bacteremia E Coli UTI Septic arthritis Cultures positive for Staph bacteremia Continue antibiotic therapy with Daptomycin per Pharmacy She is off Vanco, Aztreonam, Cefazolin.  Appreciate CCM involvement  Inferolateral wall STEMI EF 50-55 % with  New distal inferior and apical hypokinesis per ECHO Appreciate Cards follow up  Anasarca In the setting of malnutrition and CHF Intolerant to tube feeding, switched to TPN She agreed to receive Albumin  Other medical issues as per admitting team  DVT prophylaxis On SCDs  Candidiasis On Diflucan Day 3  Code Status: DNR   Other medical issues, including oliguria of renal failure, depression, CHF, deconditioning, nausea, anasarca, etc  as per primary team.  Rondel Jumbo, PA-C 04/03/2014  10:54 AM   ADDENDUM:  I agree with the above. Prognosis is still quite dismal. Thrombocytopenia is relatively stable. Hemoglobin is dropped a little bit. She's had a slight drop in the white cell count.  I still think that  supportive care is indicated. I still do not think a bone marrow test is going to change the outcome.  We will continue to follow along.  Laurey Arrow

## 2014-04-03 NOTE — Progress Notes (Signed)
MD updated via phone and new orders received. Continue to moniter Pt closely for any other acute changes.

## 2014-04-03 NOTE — Progress Notes (Addendum)
room  PROGRESS NOTE    BERNYCE BRIMLEY NIO:[id number] DOB: [birth year] DOA: 03/19/2014 PCP: No PCP Per Patient   HPI/Brief narrative 58 year old female patient with history of hypertension, obesity, DM, chronic diastolic CHF, possible rheumatoid arthritis, recent hospitalization for MSSA bacteremia of unknown source/neg TEE- cleared after Vancomycin (PCN allergy), went to SNF and then discharged from their to home, presented with complaints of not eating or drinking 4 days and husband was concerned that patient was trying to hurt herself and she was IVC and brought to ED where she was assessed as sepsis of unclear source and admitted to stepdown for further management. Patient went into septic shock requiring pressors. She also developed STEMI but poor overall candidate for aggressive intervention i.e. Currently. PCCM, ID, hematology oncology, palliative care and cardiology assisting with care. Shock resolved- pressors DC'ed 12/7 AM.  Ongoing medical issues as follows   Assessment/Plan:  Septic shock secondary to MSSA bacteremia: Continue cefazolin. Source not clear-? Status post arthrocentesis. Cell count from B/L knee synovial fluid consistent with inflammatory arthritis, culture shows MSSA from the left knee. Infectious disease follow-up appreciated. . Shock improved/resolved and DC'd pressors 12/7 AM. She has been off IV hydrocortisone . Currently on prednisone,  Fludrocortisone. As per ID, will eventually need repeat TEE but has not been medically stable enough to do so. Has been unable to obtain MRI of left knee,foot ,ankle , any attempt to do this without conscious sedation has failed. Not stable for conscious sedation,. Surveillance blood cultures 2 (12/4): Negative to date.  PICC line  placed after ID  Approval. Patient found to have MSSA septic arthritis of left knee. Antibiotics outlined by infectious disease. Infectious disease has signed off on 12/16. The recommend cefazolin 2 g IV every  8 hours for now using 12/9 as D1 .of 42 . Stop date would be 05/06/14.   Septic arthritis of the left knee. Has not been able to do MRI. Patient severely deconditioned. Therefore did not consult orthopedics for a washout  Suspected Left lower lobe airspace disease vs collapse of the left lower lobe and atelectasis of the right lower lobe, no definite pneumonia on CT scan done on 12/17. Patient currently on room air  hyponatremia: Resolved.   Inferiolateral wall STEMI: Patient has not complained of chest pain since admission. Troponins steadily increased to >20 on 12/3. Not candidate for aggressive intervention i.e. cath secondary to unstable status from shock, anemia and thrombocytopenia. Patient treated with 48 hours of IV heparin-DC'd 12/7 and was placed on aspirinbut discontinued  due to  vaginal bleeding . Marland Kitchen  2-D echo shows EF of 50-55% the distal inferior and apical hypokinesis that is new.. .   seen by Dr. Debara Pickett 12/17. Patient is a high risk for any interventional procedure and at high-risk of recurrent MI. Deemed not to be a candidate for cardiac catheterization. Unfortunately her body when tolerating medical therapy.   IVC/?? Intention to self harm: IVC removed . Psychiatry consultation note reviewed . Discussed with Dr. Harrington Challenger 12/5. Patient has capacity to make her own medical decisions and living arrangement. Meds adjusted by psyche which the patient is refusing to take . No evidence of imminent risk to self. Sitter discontinued. Patient does have capacity for psych evaluation.   Anasarca: Secondary to ongoing illnesses, malnutrition and hypo-albuminemia.worsening , Treat underlying cause and attempt to improve nutritional status. Tube feeding held  because of nausea. Intolerant to tube feeding. Continue TPN  for now. Decision needs to be made about further  TPN should be continued on not. Dr. Rhea Pink from palliative care has been helping [place] determine if the patient is actually terminally  are not despite no diagnosis of a terminal condition. He is definitely not a candidate for PEG tube placement.   Right sacral decubitus multiple areas of skin breakdown secondary to anasarca and abrasions related to her thrombocytopenia: Does not appear grossly infected. Wound care consultation completed during this hospitalization    Hypertension: patient was inic shock.  has been stable since 12/7                                                                                         Type II DM: Uncontrolled. Likely worsened by steroids. Continue  Lantus-  continue SSI.   New diagnosis of rheumatoid arthritis: Patient was on prednisone at home. Patient placed on 20 mg of prednisone. Unable to tolerate by mouth medicines therefore changed to IV Solu-Medrol minimum dose of 40 mg. RA related pain was probably the reason she had been bed bound since recent DC from SNF. Patient will have OP Rheumatology consultation .  Chronic diastolic CHF: Generalized anasarca but probably intravascularly dehydrated due to third spacing. developed acute renal failure with IV Lasix   Hypokalemia: Replaced.   Anemia: Acute on chronic. Vaginal bleeding , splenomegaly on CT scan. ,  Baseline hemoglobin probably in the 9 g per DL range. Status post 1 unit PRBC 12/4 with appropriate response. Hemoglobin trending down, now with vaginal bleeding   , type and screen,we had to discontinue asa and lovenox,  Patient has received a total of 4 units this admission. Hematology following... Recommend platelet transfusion if the patient is actively bleeding or platelets less than 20 K. INR improving with vitamin K injections. Patient's transfusion to trigger is less than 8.0   Thrombocytopenia: Possibly from acute illness/sepsis. 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 No suspicion for myelodysplasia. No indication for bone marrow biopsy. May have some underlying platelet dysfunction contribution to the bleeding.  If profoundly bleeding please transfuse platelets.   Multiple pressure wounds: Details as per [place] nurse note on 12/4. Patient has left heel DTI, right arm full-thickness abrasion and unstageable right buttock wound. Reconsult wound care because of bleeding sacral wounds.   Failure to thrive: Multifactorial   Intractable nausea. Patient started on Protonix IV, discontinued prednisone by mouth, unable to tolerate tube feeding secondary to intractable nausea and vomiting despite IV  Reglan. Concern about esophageal etiology, but will not be able to tolerate EGD. CT scan of abdomen chest and pelvis done yesterday with IV contrast.  does not show any definite of malignancy. Shows splenomegaly. Hilma Favors to explain  to the patient's family further about what the plan of care is going to be   Severe deconditioning: Severe protein calorie malnutrition: Dietitian consulted. Did not tolerate enteral feeding , now on TPN. Discussed PEG tube placement with the husband. patient is not a candidate for PEG tube placement given multiple comorbidities.   depressive disorder secondary to general medical condition: Management per psychiatry. zyprexa  added. I doubt that psychiatry has anything further to offer   oliguria renal  failure-DC with minimal response to albumin IV fluids, as per nephrology. Patient could have immune complex mediated acute renal failure. Nephrology has signed off : Likely secondary to third spacing of fluids in the setting of severe protein calorie malnutrition and intravascular dehydration. . Creatinine stable for 3 days   DVT: unable to tolerate locenox, scd's ,asa secondary to bleeding   Code Status: Limited code Family Communication:  Husband present by the bedside   Disposition Plan:  Possible discharge to SNF vs ltac   Patient does not want continued hospitalization and psychiatry (see note from 12/9) and myself feel that the patient has capacity to make her own  decisions   Consultants:  Cardiology  ID  Psychiatry  PCCM-Signed off  Procedures:  Arterial line-discontinued  Foley catheter  Antibiotics:  IV Vanc 12/3>DC'd   IV Aztreonam 12/3> 12/4   IV Ancef.  Subjective:  patient refusing to be  touched, tender to touch  Everywhere, refusing to take pills  Objective: Filed Vitals:   04/02/14 1650 04/02/14 1901 04/02/14 2118 04/03/14 0507  BP: 123/56 132/72 138/68 128/60  Pulse: 85 106 102 84  Temp: 99.4 F (37.4 C) 98.7 F (37.1 C) 98.8 F (37.1 C) 98.4 F (36.9 C)  TempSrc: Oral Oral Oral Oral  Resp: 22 20 22 20   Height:      Weight:    94.711 kg (208 lb 12.8 oz)  SpO2: 95% 98% 96% 98%   blood pressures actually better than listed bowel-as discussed with nursing systolic in the 073X and map 73   Intake/Output Summary (Last 24 hours) at 04/03/14 1353 Last data filed at 04/03/14 0700  Gross per 24 hour  Intake 1596.67 ml  Output    475 ml  Net 1121.67 ml   Filed Weights   04/01/14 0427 04/02/14 0452 04/03/14 0507  Weight: 93.123 kg (205 lb 4.8 oz) 94.983 kg (209 lb 6.4 oz) 94.711 kg (208 lb 12.8 oz)     Exam:  General exam: Pleasant middle-aged female, extremely dry oral mucosa, generalized anasarca Respiratory system: Slightly diminished breath sounds in the bases but otherwise clear to auscultation. No increased work of breathing. Cardiovascular system: S1 & S2 heard, RRR. No JVD, murmurs, gallops, clicks. Anasarca. Telemetry: SB in the 50s-SR. Gastrointestinal system: Abdomen is nondistended, soft and nontender. Normal bowel sounds heard. Patient has patchy ecchymosis over right upper quadrant. Central nervous system: Alert and oriented 3. No focal neurological deficits. Extremities: Symmetric 5 x 5 power. Diffuse anasarca Skin: Dry appearing right sacral decubitus without signs of overt infection. Patient has skin tear over the right posterior upper arm. Left heel DTI without signs of acute  infection. Psychiatry: Pleasant and comfortable today. Denies suicidal ideations.   Data Reviewed: Basic Metabolic Panel:  Recent Labs Lab 03/29/14 1120 03/30/14 0400 03/31/14 0435 04/01/14 0613 04/02/14 0358 04/03/14 0500  NA 145 146 146 143 141 145  K 3.6* 3.5* 3.0* 3.4* 4.1 4.2  CL 112 113* 113* 112 112 116*  CO2 19 19 18* 18* 18* 17*  GLUCOSE 77 167* 113* 197* 229* 263*  BUN 67* 74* 80* 82* 84* 88*  CREATININE 1.55* 1.63* 1.55* 1.38* 1.35* 1.31*  CALCIUM 7.8* 8.0* 7.7* 7.6* 7.5* 8.0*  MG 2.3 2.3 2.1  2.2  --  1.9 1.9  PHOS 4.7* 5.0* 4.1  --  3.2 3.5   Liver Function Tests:  Recent Labs Lab 03/29/14 1120 03/30/14 0400 03/31/14 0435 04/01/14 0613 04/02/14 0358  AST 35 25 26 26  33  ALT <5 <5 <5 <5 <5  ALKPHOS 309* 245* 260* 264* 295*  BILITOT 1.3* 0.9 0.9 0.8 0.6  PROT 5.3* 5.1* 5.2* 5.2* 4.8*  ALBUMIN 2.4* 2.4* 2.3* 2.1* 1.8*   No results for input(s): LIPASE, AMYLASE in the last 168 hours. No results for input(s): AMMONIA in the last 168 hours. CBC:  Recent Labs Lab 03/30/14 0400 03/31/14 0435 04/01/14 0613 04/02/14 0358 04/02/14 2000 04/03/14 0500  WBC 3.4* 4.1 4.0 3.2*  --  2.9*  NEUTROABS 2.9  --   --   --   --   --   HGB 8.2* 8.9* 8.5* 7.5* 9.0* 8.1*  HCT 25.2* 27.9* 26.5* 24.0* 28.7* 25.5*  MCV 89.0 90.3 91.1 92.3  --  91.7  PLT 63* 64* 51* 47*  --  46*   Cardiac Enzymes:  Recent Labs Lab 04/02/14 2000  CKTOTAL 17   BNP (last 3 results) No results for input(s): PROBNP in the last 8760 hours. CBG:  Recent Labs Lab 04/02/14 2111 04/03/14 0026 04/03/14 0404 04/03/14 0733 04/03/14 1135  GLUCAP 272* 265* 233* 211* 205*    Recent Results (from the past 240 hour(s))  Body fluid culture     Status: None   Collection Time: 03/25/14  6:37 PM  Result Value Ref Range Status   Specimen Description SYNOVIAL RIGHT KNEE  Final   Special Requests NONE  Final   Gram Stain   Final    FEW WBC PRESENT,BOTH PMN AND MONONUCLEAR NO ORGANISMS  SEEN Performed at [facility]    Culture   Final    NO GROWTH 3 DAYS Performed at [facility]    Report Status 03/29/2014 FINAL  Final  Body fluid culture     Status: None   Collection Time: 03/25/14  6:46 PM  Result Value Ref Range Status   Specimen Description SYNOVIAL LEFT KNEE  Final   Special Requests NONE  Final   Gram Stain   Final    ABUNDANT WBC PRESENT,BOTH PMN AND MONONUCLEAR NO ORGANISMS SEEN Performed at [facility]    Culture   Final    RARE STAPHYLOCOCCUS AUREUS Note: RIFAMPIN AND GENTAMICIN SHOULD NOT BE USED AS SINGLE DRUGS FOR TREATMENT OF STAPH INFECTIONS. CALLED TO KIMBERLY OKAFOR 03/30/14 1315 BY SMITHERSJ Performed at [facility]    Report Status 03/31/2014 FINAL  Final   Organism ID, Bacteria STAPHYLOCOCCUS AUREUS  Final      Susceptibility   Staphylococcus aureus - MIC*    CLINDAMYCIN <=0.25 SENSITIVE Sensitive     ERYTHROMYCIN <=0.25 SENSITIVE Sensitive     GENTAMICIN <=0.5 SENSITIVE Sensitive     LEVOFLOXACIN 0.25 SENSITIVE Sensitive     OXACILLIN 0.5 SENSITIVE Sensitive     PENICILLIN 0.12 SENSITIVE Sensitive     RIFAMPIN <=0.5 SENSITIVE Sensitive     TRIMETH/SULFA <=10 SENSITIVE Sensitive     VANCOMYCIN <=0.5 SENSITIVE Sensitive     TETRACYCLINE <=1 SENSITIVE Sensitive     MOXIFLOXACIN <=0.25 SENSITIVE Sensitive     * RARE STAPHYLOCOCCUS AUREUS         Studies: Ct Chest W Contrast  04/03/2014   CLINICAL DATA:  Esophageal abnormality an unintentional weight loss.  EXAM: CT CHEST, ABDOMEN, AND PELVIS WITH CONTRAST  TECHNIQUE: Multidetector CT imaging of the chest, abdomen and pelvis was performed following the standard protocol during bolus administration of intravenous contrast.  CONTRAST:  41m OMNIPAQUE IOHEXOL 300 MG/ML  SOLN  COMPARISON:  None.  FINDINGS: CT CHEST FINDINGS  THORACIC INLET/BODY WALL:  Anasarca as noted previously. Negative thyroid. There is a right upper extremity PICC with tip at the  SVC.  MEDIASTINUM:  Mild cardiomegaly. There is a trace pericardial effusion which is stable. Coronary atherosclerosis is present. No acute vascular findings. No lymphadenopathy. There is fluid right of the distal esophagus, herniated to the hiatus.  LUNG WINDOWS:  Unchanged small pleural effusions, left greater than right. There is complete collapse of the left lower lobe. Atelectasis in the right lower lobe is subsegmental. No edema or definitive pneumonia.  OSSEOUS:  See below.  CT ABDOMEN AND PELVIS FINDINGS  BODY WALL: Anasarca  Liver: No focal abnormality.  Biliary: The gallbladder is distended and mildly high density. Reportedly the patient is on TPN in this is likely from fasting state. There is no definitive surrounding inflammation when accounting for ascites. No report of abdominal pain to suggest cholecystitis. A gallstone is again noted.  Pancreas: Edema around the pancreas is likely related to the patient's generalized edematous state rather than pancreatitis.  Spleen: Splenic enlargement, measuring up to 14 cm in maximal axial dimension. There are fairly circumscribed subcapsular and roughly wedge-shaped perfusion defects compatible with infarcts. No indication of rupture.  Adrenals: Unremarkable.  Kidneys and ureters: No hydronephrosis or stone.  Bladder: Decompressed by Foley catheter  Reproductive: 2 uterine fibroids, measuring up to 4.5 cm on the left. The left uterine fibroid deforms the endometrium at the level of the uterine horn.  Bowel: There is a prominent volume of rectal stool, similar to previous. Mesorectal fat infiltration could be from pressure changes or the edematous state. No evidence of bowel obstruction or perforation. Negative appendix.  Retroperitoneum: No mass or adenopathy.  Peritoneum: Ascites which appears simple but increased, now moderate volume.  Vascular: No acute abnormality.  OSSEOUS: There is a peripherally enhancing fluid collection around the greater trochanter on  the right which is unchanged from previous. There is a small volume of fluid extending posteriorly around the proximal right femur, which may be new from prior or not imaged previously.  Bridging osteophytes throughout the thoracic and upper lumbar spine.  IMPRESSION: 1. No definitive malignancy within the chest, abdomen, or pelvis. 2. Splenomegaly with multiple infarcts, unchanged from 03/28/2014. 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 3. Volume overload with anasarca, pleural effusions, and ascites. Ascites is increased over the past 5 days, now moderate. 4. Progressive atelectasis, now with complete collapse of the left lower lobe. 5. Probable rectal impaction. 6. Unchanged fluid collection around the right greater trochanter, likely bursitis. 7. Cholelithiasis. Gallbladder distention is likely from fasting state in this patient on TPN.   Electronically Signed   By: Jorje Guild M.D.   On: 04/03/2014 04:00   Ct Abdomen Pelvis W Contrast  04/03/2014   CLINICAL DATA:  Esophageal abnormality an unintentional weight loss.  EXAM: CT CHEST, ABDOMEN, AND PELVIS WITH CONTRAST  TECHNIQUE: Multidetector CT imaging of the chest, abdomen and pelvis was performed following the standard protocol during bolus administration of intravenous contrast.  CONTRAST:  65m OMNIPAQUE IOHEXOL 300 MG/ML  SOLN  COMPARISON:  None.  FINDINGS: CT CHEST FINDINGS  THORACIC INLET/BODY WALL:  Anasarca as noted previously. Negative thyroid. There is a right upper extremity PICC with tip at the SVC.  MEDIASTINUM:  Mild cardiomegaly. There is a trace pericardial effusion which is stable. Coronary atherosclerosis is present. No acute vascular findings. No lymphadenopathy. There is fluid right of  the distal esophagus, herniated to the hiatus.  LUNG WINDOWS:  Unchanged small pleural effusions, left greater than right. There is complete collapse of the  left lower lobe. Atelectasis in the right lower lobe is subsegmental. No edema or definitive pneumonia.  OSSEOUS:  See below.  CT ABDOMEN AND PELVIS FINDINGS  BODY WALL: Anasarca  Liver: No focal abnormality.  Biliary: The gallbladder is distended and mildly high density. Reportedly the patient is on TPN in this is likely from fasting state. There is no definitive surrounding inflammation when accounting for ascites. No report of abdominal pain to suggest cholecystitis. A gallstone is again noted.  Pancreas: Edema around the pancreas is likely related to the patient's generalized edematous state rather than pancreatitis.  Spleen: Splenic enlargement, measuring up to 14 cm in maximal axial dimension. There are fairly circumscribed subcapsular and roughly wedge-shaped perfusion defects compatible with infarcts. No indication of rupture.  Adrenals: Unremarkable.  Kidneys and ureters: No hydronephrosis or stone.  Bladder: Decompressed by Foley catheter  Reproductive: 2 uterine fibroids, measuring up to 4.5 cm on the left. The left uterine fibroid deforms the endometrium at the level of the uterine horn.  Bowel: There is a prominent volume of rectal stool, similar to previous. Mesorectal fat infiltration could be from pressure changes or the edematous state. No evidence of bowel obstruction or perforation. Negative appendix.  Retroperitoneum: No mass or adenopathy.  Peritoneum: Ascites which appears simple but increased, now moderate volume.  Vascular: No acute abnormality.  OSSEOUS: There is a peripherally enhancing fluid collection around the greater trochanter on the right which is unchanged from previous. There is a small volume of fluid extending posteriorly around the proximal right femur, which may be new from prior or not imaged previously.  Bridging osteophytes throughout the thoracic and upper lumbar spine.  IMPRESSION: 1. No definitive malignancy within the chest, abdomen, or pelvis. 2. Splenomegaly with  multiple infarcts, unchanged from 03/28/2014. 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 3. Volume overload with anasarca, pleural effusions, and ascites. Ascites is increased over the past 5 days, now moderate. 4. Progressive atelectasis, now with complete collapse of the left lower lobe. 5. Probable rectal impaction. 6. Unchanged fluid collection around the right greater trochanter, likely bursitis. 7. Cholelithiasis. Gallbladder distention is likely from fasting state in this patient on TPN.   Electronically Signed   By: Jorje Guild M.D.   On: 04/03/2014 04:00        Scheduled Meds: . antiseptic oral rinse  7 mL Mouth Rinse q12n4p  . chlorhexidine  15 mL Mouth Rinse BID  . DAPTOmycin (CUBICIN)  IV  500 mg Intravenous Q24H  . feeding supplement (ENSURE COMPLETE)  237 mL Oral TID BM  . fentaNYL  12.5 mcg Transdermal Q72H  . fluconazole (DIFLUCAN) IV  200 mg Intravenous Q24H  . hydrocortisone cream   Topical BID  . insulin aspart  0-20 Units Subcutaneous 6 times per day  . insulin glargine  12 Units Subcutaneous Daily  . magic mouthwash w/lidocaine  10 mL Oral TID  . methylPREDNISolone (SOLU-MEDROL) injection  40 mg Intravenous Daily  . metoprolol tartrate  12.5 mg Oral q morning - 10a  . mirtazapine  15 mg Oral QHS  . modafinil  100 mg Oral Daily  . nystatin  5 mL Oral QID  . OLANZapine zydis  5 mg Oral QHS  . ondansetron (ZOFRAN) IV  4 mg Intravenous 4 times  per day  . pantoprazole (PROTONIX) IV  40 mg Intravenous Q12H  . sodium bicarbonate  650 mg Oral TID  . sodium chloride  10-40 mL Intracatheter Q12H   Continuous Infusions: . sodium chloride 40 mL/hr at 04/02/14 1800  . Marland KitchenTPN (CLINIMIX-E) Adult 60 mL/hr at 04/02/14 1739   And  . fat emulsion 250 mL (04/02/14 1738)  . Marland KitchenTPN (CLINIMIX-E) Adult     And  . fat emulsion      Active Problems:   DM2 (diabetes mellitus, type 2)    Physical deconditioning   Hypoalbuminemia   Hyponatremia   Severe sepsis   Acute UTI   Protein-calorie malnutrition, severe   Sacral decubitus ulcer   Acute pulmonary edema   Effusion into joint   ST elevation myocardial infarction (STEMI) of inferolateral wall, initial episode of care   Severe sepsis with septic shock   Diabetic ulcer of left foot associated with diabetes mellitus due to underlying condition   Osteomyelitis   Ulcer of heel   Acute osteomyelitis of femur   Septic arthritis of knee, left   STEMI (ST elevation myocardial infarction)   Ulcer of left heel   Metabolic acidosis   Bacteremia   Knee pain   Ileus   Nausea with vomiting   Palliative care encounter   Appetite loss    Time spent: 40 minutes.    Reyne Dumas, MD [facility] Pager [phone]  If 7PM-7AM, please contact night-coverage [email or web address] Password [email or web address] 04/03/2014, 1:53 PM    LOS: 15 days

## 2014-04-03 NOTE — Progress Notes (Signed)
[facility]    Date of Admission:  03/19/2014   Total days of antibiotics 14        Day 12cefazolin           ID: Robin Schroeder is a 58 y.o. female with recurrent MSSA bacteremia from LEFT SEPTIC ARTHRITIS c/b inferior STEMI, AKI, deconditioned/bed bound, severe RA, depression, and anasarca from severe protein caloric malnutrition c/b pancytopenia with intermittent vaginal bleeding due to thrombocytopenia Active Problems:   DM2 (diabetes mellitus, type 2)   Physical deconditioning   Hypoalbuminemia   Hyponatremia   Severe sepsis   Acute UTI   Protein-calorie malnutrition, severe   Sacral decubitus ulcer   Acute pulmonary edema   Effusion into joint   ST elevation myocardial infarction (STEMI) of inferolateral wall, initial episode of care   Severe sepsis with septic shock   Diabetic ulcer of left foot associated with diabetes mellitus due to underlying condition   Osteomyelitis   Ulcer of heel   Acute osteomyelitis of femur   Septic arthritis of knee, left   STEMI (ST elevation myocardial infarction)   Ulcer of left heel   Metabolic acidosis   Bacteremia   Knee pain   Ileus   Nausea with vomiting   Palliative care encounter   Appetite loss    Subjective: Afebrile, she initially participates in physical exam (opening mouth) but then shrieks when i lifted bedsheet to look at her legs. "please leave".    Underwent CT of chest abd pelvis yesterday. Showed pleural effusion L> R ,splenomegaily, worsening ascites   Medications:  . antiseptic oral rinse  7 mL Mouth Rinse q12n4p  . chlorhexidine  15 mL Mouth Rinse BID  . DAPTOmycin (CUBICIN)  IV  500 mg Intravenous Q24H  . feeding supplement (ENSURE COMPLETE)  237 mL Oral TID BM  . fentaNYL  12.5 mcg Transdermal Q72H  . fluconazole (DIFLUCAN) IV  200 mg Intravenous Q24H  . hydrocortisone cream   Topical BID  . insulin aspart  0-20 Units Subcutaneous 6 times per day  . insulin glargine  12  Units Subcutaneous Daily  . magic mouthwash w/lidocaine  10 mL Oral TID  . methylPREDNISolone (SOLU-MEDROL) injection  40 mg Intravenous Daily  . metoprolol tartrate  12.5 mg Oral q morning - 10a  . mirtazapine  15 mg Oral QHS  . modafinil  100 mg Oral Daily  . nystatin  5 mL Oral QID  . OLANZapine zydis  5 mg Oral QHS  . ondansetron (ZOFRAN) IV  4 mg Intravenous 4 times per day  . pantoprazole (PROTONIX) IV  40 mg Intravenous Q12H  . sodium bicarbonate  650 mg Oral TID  . sodium chloride  10-40 mL Intracatheter Q12H    Objective: Vital signs in last 24 hours: Temp:  [98.2 F (36.8 C)-99.5 F (37.5 C)] 98.2 F (36.8 C) (12/18 1433) Pulse Rate:  [84-106] 104 (12/18 1433) Resp:  [18-22] 20 (12/18 1433) BP: (123-152)/(56-80) 152/80 mmHg (12/18 1433) SpO2:  [95 %-98 %] 97 % (12/18 1433) Weight:  [208 lb 12.8 oz (94.711 kg)] 208 lb 12.8 oz (94.711 kg) (12/18 0507) Physical Exam  Constitutional: . appears anasarcic, disheveled.  looks fatigue. Answering some questions today. Easily awakens Heent: she has dried exudate on lips (poor oral hygiene) some oral ulcers on tongue and lateral aspect of tongue, but also has brownish debris. Unable to see posterior pharynx Cardiovascular: Normal rate, regular rhythm and normal heart sounds. Exam reveals  no gallop and no friction rub.  No murmur heard.  Pulmonary/Chest: Effort normal and breath sounds normal. No respiratory distress.  has no wheezes.  Abdominal: mildlly distension. Decrease bowel sounds.There is no tenderness.  Skin: ulcer covered with bandage, diffuse anasarca Ext: diffusely anasarca are equal in L and R arm, more so that anasarca to lower extremities. Psychiatric:labile  Lab Results  Recent Labs  04/02/14 0358 04/02/14 2000 04/03/14 0500  WBC 3.2*  --  2.9*  HGB 7.5* 9.0* 8.1*  HCT 24.0* 28.7* 25.5*  NA 141  --  145  K 4.1  --  4.2  CL 112  --  116*  CO2 18*  --  17*  BUN 84*  --  88*  CREATININE 1.35*  --  1.31*     Lab Results  Component Value Date   ESRSEDRATE 90* 03/21/2014   Lab Results  Component Value Date   CRP 16.1* 03/21/2014   Arthrocentesis =  40,000 cells 88%N and 11,050 95% N and no cells. Cloudy with rBC Microbiology: 12/3 blood cx x 2 MSSA 12/4 blood cx NGTD 12/9 left knee arthrocentesis = Staph aureus 12/9 right knee arthrocentesis = NGTD  Studies/Results: Ct Chest W Contrast  04/03/2014   CLINICAL DATA:  Esophageal abnormality an unintentional weight loss.  EXAM: CT CHEST, ABDOMEN, AND PELVIS WITH CONTRAST  TECHNIQUE: Multidetector CT imaging of the chest, abdomen and pelvis was performed following the standard protocol during bolus administration of intravenous contrast.  CONTRAST:  59mL OMNIPAQUE IOHEXOL 300 MG/ML  SOLN  COMPARISON:  None.  FINDINGS: CT CHEST FINDINGS  THORACIC INLET/BODY WALL:  Anasarca as noted previously. Negative thyroid. There is a right upper extremity PICC with tip at the SVC.  MEDIASTINUM:  Mild cardiomegaly. There is a trace pericardial effusion which is stable. Coronary atherosclerosis is present. No acute vascular findings. No lymphadenopathy. There is fluid right of the distal esophagus, herniated to the hiatus.  LUNG WINDOWS:  Unchanged small pleural effusions, left greater than right. There is complete collapse of the left lower lobe. Atelectasis in the right lower lobe is subsegmental. No edema or definitive pneumonia.  OSSEOUS:  See below.  CT ABDOMEN AND PELVIS FINDINGS  BODY WALL: Anasarca  Liver: No focal abnormality.  Biliary: The gallbladder is distended and mildly high density. Reportedly the patient is on TPN in this is likely from fasting state. There is no definitive surrounding inflammation when accounting for ascites. No report of abdominal pain to suggest cholecystitis. A gallstone is again noted.  Pancreas: Edema around the pancreas is likely related to the patient's generalized edematous state rather than pancreatitis.  Spleen: Splenic  enlargement, measuring up to 14 cm in maximal axial dimension. There are fairly circumscribed subcapsular and roughly wedge-shaped perfusion defects compatible with infarcts. No indication of rupture.  Adrenals: Unremarkable.  Kidneys and ureters: No hydronephrosis or stone.  Bladder: Decompressed by Foley catheter  Reproductive: 2 uterine fibroids, measuring up to 4.5 cm on the left. The left uterine fibroid deforms the endometrium at the level of the uterine horn.  Bowel: There is a prominent volume of rectal stool, similar to previous. Mesorectal fat infiltration could be from pressure changes or the edematous state. No evidence of bowel obstruction or perforation. Negative appendix.  Retroperitoneum: No mass or adenopathy.  Peritoneum: Ascites which appears simple but increased, now moderate volume.  Vascular: No acute abnormality.  OSSEOUS: There is a peripherally enhancing fluid collection around the greater trochanter on the right which is unchanged  from previous. There is a small volume of fluid extending posteriorly around the proximal right femur, which may be new from prior or not imaged previously.  Bridging osteophytes throughout the thoracic and upper lumbar spine.  IMPRESSION: 1. No definitive malignancy within the chest, abdomen, or pelvis. 2. Splenomegaly with multiple infarcts, unchanged from 03/28/2014. 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 3. Volume overload with anasarca, pleural effusions, and ascites. Ascites is increased over the past 5 days, now moderate. 4. Progressive atelectasis, now with complete collapse of the left lower lobe. 5. Probable rectal impaction. 6. Unchanged fluid collection around the right greater trochanter, likely bursitis. 7. Cholelithiasis. Gallbladder distention is likely from fasting state in this patient on TPN.   Electronically Signed   By: Tiburcio Pea M.D.   On:  04/03/2014 04:00   Ct Abdomen Pelvis W Contrast  04/03/2014   CLINICAL DATA:  Esophageal abnormality an unintentional weight loss.  EXAM: CT CHEST, ABDOMEN, AND PELVIS WITH CONTRAST  TECHNIQUE: Multidetector CT imaging of the chest, abdomen and pelvis was performed following the standard protocol during bolus administration of intravenous contrast.  CONTRAST:  35mL OMNIPAQUE IOHEXOL 300 MG/ML  SOLN  COMPARISON:  None.  FINDINGS: CT CHEST FINDINGS  THORACIC INLET/BODY WALL:  Anasarca as noted previously. Negative thyroid. There is a right upper extremity PICC with tip at the SVC.  MEDIASTINUM:  Mild cardiomegaly. There is a trace pericardial effusion which is stable. Coronary atherosclerosis is present. No acute vascular findings. No lymphadenopathy. There is fluid right of the distal esophagus, herniated to the hiatus.  LUNG WINDOWS:  Unchanged small pleural effusions, left greater than right. There is complete collapse of the left lower lobe. Atelectasis in the right lower lobe is subsegmental. No edema or definitive pneumonia.  OSSEOUS:  See below.  CT ABDOMEN AND PELVIS FINDINGS  BODY WALL: Anasarca  Liver: No focal abnormality.  Biliary: The gallbladder is distended and mildly high density. Reportedly the patient is on TPN in this is likely from fasting state. There is no definitive surrounding inflammation when accounting for ascites. No report of abdominal pain to suggest cholecystitis. A gallstone is again noted.  Pancreas: Edema around the pancreas is likely related to the patient's generalized edematous state rather than pancreatitis.  Spleen: Splenic enlargement, measuring up to 14 cm in maximal axial dimension. There are fairly circumscribed subcapsular and roughly wedge-shaped perfusion defects compatible with infarcts. No indication of rupture.  Adrenals: Unremarkable.  Kidneys and ureters: No hydronephrosis or stone.  Bladder: Decompressed by Foley catheter  Reproductive: 2 uterine fibroids,  measuring up to 4.5 cm on the left. The left uterine fibroid deforms the endometrium at the level of the uterine horn.  Bowel: There is a prominent volume of rectal stool, similar to previous. Mesorectal fat infiltration could be from pressure changes or the edematous state. No evidence of bowel obstruction or perforation. Negative appendix.  Retroperitoneum: No mass or adenopathy.  Peritoneum: Ascites which appears simple but increased, now moderate volume.  Vascular: No acute abnormality.  OSSEOUS: There is a peripherally enhancing fluid collection around the greater trochanter on the right which is unchanged from previous. There is a small volume of fluid extending posteriorly around the proximal right femur, which may be new from prior or not imaged previously.  Bridging osteophytes throughout the thoracic and upper lumbar spine.  IMPRESSION: 1. No definitive malignancy within the chest, abdomen, or pelvis. 2. Splenomegaly with  multiple infarcts, unchanged from 03/28/2014. 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 3. Volume overload with anasarca, pleural effusions, and ascites. Ascites is increased over the past 5 days, now moderate. 4. Progressive atelectasis, now with complete collapse of the left lower lobe. 5. Probable rectal impaction. 6. Unchanged fluid collection around the right greater trochanter, likely bursitis. 7. Cholelithiasis. Gallbladder distention is likely from fasting state in this patient on TPN.   Electronically Signed   By: Tiburcio Pea M.D.   On: 04/03/2014 04:00    Assessment/Plan: Recurrent MSSA bacteremia likely associated with left knee septic arthritis. Unclear if other sources since she has numerous pressure ulcers, unable to mobilize due to deconditioning and polyarthritis from untreated rheumatoid arthritis,(newly diagnosed in Oct 2015) severe protein caloric malnutrition, and  oligouric ATN AKI. Patient and family having discussion with palliative care. Plan is unchanged from ID standpoint  MSSA bacteremia = recurrent bacteremia maybe due to left knee septic arthritis (since cx grew MSSA).  I am doing a trial of daptomycin to see if her pancytopenia improves at all, predominantly her platelets. She possibly has side effect of pancytopenia for cephalosporin. If no change noted in the next 5 days, then would return back to cefazolin. She would need treatment for 6 wks until 1/20, then would need to see her back to evaluate if she needs further antibiotics for septic arthritis  MSSA septic arthritis = normally would have orthopedics wash out. Patient is in such poor deconditioned state, not sure what they would recommend. Would wait to consult ortho since patient will likely needs goals of care discussion. Recent STEMI, inferior infarct precludes her from any extensive sedation.  aki with oligouria =  Currently getting blood tsf and albumin. Defer to renal fro further recs  Coagulopathy = getting vitamin k to minimize bleeding and possibly plt tsf today  Mouth ulcer = unclear if it is hsv related. Will do swab of open lesion to test for HSV. Will not empirically treat in order to minimize any nephrotoxicity  Anasarca  2/2 protein caloric malnutrition= currently on TPN. Her recent STEMI places her at risk for further recurrent ACS if she undergoes sedation for PEG procedure. Recommend to re-evaluate in 14 days, since the longer she is on TPN the risk for invasive fungal infection increases.  Pressure ulcer = continue with local wound care  Newly diagnosed Rheumatoid Arthritis = continue on prednisone 20 mg daily to see if she has temporary relief of joint pain, possibly increased appetite. I suspect her immobility is in part due other polyarthritis from RA.   - need pt and ot to help with range of motion to minimize deconditioning  ACS/inferior STEMI= continue low dose asa  and low dose metop for management.   Depression =   Dr. Phillips Odor helping with management  ?PTSD = patient has significant aversion to medical staff. Does have good relationship with chaplain. Challenging to provide care if patient is refusing care. Recommend to have family meeting with her husband and her to see what are their goals of care.  Dr .Luciana Axe available for questions over the weekend.    Drue Second [facility] for Infectious Diseases Cell: [phone] Pager: [phone]  04/03/2014, 3:20 PM

## 2014-04-03 NOTE — Progress Notes (Signed)
Palliative Medicine Team Progress Note  Detailed conversation with Mr. Garmon. I went over the details of his wife's condition in immense detail and spent >60 minutes answering his questions about her diagnoses and prognosis as well as disease trajectory. I was direct with him that she is dying- she will not be able to overcome the severity of her illness and decline. There is a very pronounced superimposed psychiatric condition that makes determining the best course of care for her difficult- she will not allow physicians to examine her without screaming and displaying significant signs of "terror". She has not improved since admission and has actually deteriorated despite very aggressive treatment. I have recommended a hospice facility for her care- I suspect that some of her condition is related to severe anxiety and fear-possible prior life trauma- I discussed this in detail with Donnelly Stager the chaplain.   Mr. Deckard agrees to a hospice facility and shifting her care towards full comfort- she is clearly suffering and he wants her to be comfortable and have dignity.  Recommendations:  1. Stop TPN 2. Use IV to administer IV pain and anxiety medications-she needs scheduled IV anxiolytics 3. Continue IV steroids for inflammatory pain and RA  4. Stop all invasive procedures and blood draws. 5. Maintain antibiotics until discharge and then discontinue. 6 Wound care pre-meds  Requested CSW place residential hospice referral.  Anderson Malta, DO Palliative Medicine [phone]

## 2014-04-03 NOTE — Progress Notes (Signed)
Pt with Nosebleed moderate amount of blood coming from Right Nostril and trickle of blood coming from Left Nostril. VSS. No other acute changes noted with Pt's assessment. MD paged.

## 2014-04-03 NOTE — Progress Notes (Signed)
Patient remembered me from previous visits and welcomed me. I brought her a gift that [employer] students made for patients. She at first said, "You shouldn't spend your money on me. Use it to take care of your family and be kind to them--especially at Christmas." I explained again where it came from and then she accepted it saying how nice it was for them to do that. I talked about the contents with her and she responded appropriately. It was a pleasant conversation. I was just outside the room when a doctor arrived.  I  witnessed an abrupt change in her behavior as the medical staff person  attempted to examine her. She responded in fear and cried out even though the staff member was very gentle and spoke softly to her. The patient went back to her usual self when the staff member discontinued trying to examine her.   Donnelly Stager, PhD, [facility] Chaplain

## 2014-04-03 NOTE — Progress Notes (Signed)
Inpatient Diabetes Program Recommendations  AACE/ADA: New Consensus Statement on Inpatient Glycemic Control (2013)  Target Ranges:  Prepandial:   less than 140 mg/dL      Peak postprandial:   less than 180 mg/dL (1-2 hours)      Critically ill patients:  140 - 180 mg/dL     Results for MALAYZIA, LAFORTE (MRN [id number]) as of 04/03/2014 09:47  Ref. Range 04/02/2014 00:16 04/02/2014 04:50 04/02/2014 07:49 04/02/2014 12:17 04/02/2014 15:56 04/02/2014 21:11  Glucose-Capillary Latest Range: 70-99 mg/dL 846 (H) 659 (H) 935 (H) 180 (H) 226 (H) 272 (H)    Results for KANITRA, PURIFOY (MRN [id number]) as of 04/03/2014 09:47  Ref. Range 04/03/2014 00:26 04/03/2014 04:04 04/03/2014 07:33  Glucose-Capillary Latest Range: 70-99 mg/dL 300 (H) 923 (H) 300 (H)     Current Insulin Orders: Lantus 12 units daily      Novolog Resistant SSI Q4 hours   **Patient currently receiving TPN @ 60 cc/hour.  No insulin currently in TPN infusion.  **Patient also getting IV Solumedrol 40 mg daily.    Pharmacy- Do you plan to add Insulin to TPN today?  MD- If no insulin added to TPN, please consider increasing Lantus dose by 20% to Lantus 15 units daily     Will follow Ambrose Finland RN, MSN, CDE Diabetes Coordinator Inpatient Diabetes Program Team Pager: [phone] (8a-10p)

## 2014-04-04 DIAGNOSIS — M0500 Felty's syndrome, unspecified site: Secondary | ICD-10-CM

## 2014-04-04 LAB — GLUCOSE, CAPILLARY
Glucose-Capillary: 210 mg/dL — ABNORMAL HIGH (ref 70–99)
Glucose-Capillary: 161 mg/dL — ABNORMAL HIGH (ref 70–99)

## 2014-04-04 MED ORDER — INSULIN GLARGINE 100 UNIT/ML ~~LOC~~ SOLN
10.0000 [IU] | Freq: Every day | SUBCUTANEOUS | Status: DC
  Administered 2014-04-04 – 2014-04-06 (×3): 10 [IU] via SUBCUTANEOUS
  Filled 2014-04-04 (×3): qty 0.1

## 2014-04-04 NOTE — Progress Notes (Addendum)
PHARMACY BRIEF NOTE:   TPN  TPN discontinued yesterday by Palliative Care MD as patient transitions to full comfort care.    Invasive procedures and blood draws are being discontinued as well.  Patient was requiring Lantus 12 units daily as part of her glucose control regimen while on TPN.   Has h/o DM but not on insulin prior to admission.  Currently remains on SoluMedrol which may elevate CBGs.  Recommend: 1. Have discontinued Lantus for now with patient off TPN. 2. MD please reorder Lantus if still medically indicated.   Thank you,  Elie Goody, PharmD, BCPS Pager: [phone] 04/04/2014  7:50 AM

## 2014-04-04 NOTE — Clinical Social Work Note (Signed)
CSW received a call from MD who stated that she believes pt is close to end of life and she had spoken to her husband about hospice care for pt   MD stated that pt's husband requested [facility] who had already been out to see pt earlier in the week  MD stated that she had been treating pt with valium and her psychiatric symptoms were better but her prognosis was that pt was in need of end of life care    MD requested CSW call hospice back out for new assessment (they had been out earlier this week)  due to changes in pt's status MD believes pt is dying  CSW called and spoke with [facility] and requested new assessment and placement per palliative MD note  Per hospice, they called and spoke with pt's husband who stated that he had to go to work today but agreed to meet with her in the morning 8:30 Sunday to sign paperwork  Hospice was planning on assessing pt at that time and to see if she met criteria for placement  CSW spoke with MD and let her know that hospice would be out in the morning to assess for placement  CSW will continue to monitor for needs until discharge .Dede Query, LCSW [facility] Clinical Social Worker - Weekend Coverage cell #: [phone]

## 2014-04-04 NOTE — Progress Notes (Signed)
[facility] PROGRESS NOTE  Robin Schroeder KGY:[id number] DOB: May 18, 1955 DOA: 03/19/2014 PCP: No PCP Per Patient  Assessment/Plan: Septic shock secondary to MSSA bacteremia Septic arthritis of left knee  STEMI Rheumatoid arthritis Hypertension  type 2 diabetes Multiple pressure wounds  severe deconditioning  severe protein caloric malnutrition  Patient continues to physically decline. Appreciate detailed consultation note by palliative care. Note that patient has elected to discontinue all medical treatments and to pursue hospice placement. Social work aware with plans for hospice evaluation in the morning. We'll continue to follow.     Code Status: DO NOT RESUSCITATE Family Communication: Patient only  Disposition Plan: To hospice home once that available    Consultants:  Palliative care   Antibiotics:  None    Subjective: Patient continues to have nose and vaginal bleeding. Does not allow physical exam   Objective: Filed Vitals:   04/03/14 1433 04/03/14 1900 04/04/14 0610 04/04/14 1440  BP: 152/80 144/82 131/70 138/66  Pulse: 104 103 68 74  Temp: 98.2 F (36.8 C) 98.3 F (36.8 C) 97.9 F (36.6 C) 98 F (36.7 C)  TempSrc: Oral Oral Oral Oral  Resp: 20 20 20 20   Height:      Weight:   96.888 kg (213 lb 9.6 oz)   SpO2: 97% 97% 99% 99%    Intake/Output Summary (Last 24 hours) at 04/04/14 1704 Last data filed at 04/04/14 1633  Gross per 24 hour  Intake   1660 ml  Output    875 ml  Net    785 ml   Filed Weights   04/02/14 0452 04/03/14 0507 04/04/14 0610  Weight: 94.983 kg (209 lb 6.4 oz) 94.711 kg (208 lb 12.8 oz) 96.888 kg (213 lb 9.6 oz)    Exam:  Patient refuses to be examined   Data Reviewed: Basic Metabolic Panel:  Recent Labs Lab 03/29/14 1120 03/30/14 0400 03/31/14 0435 04/01/14 0613 04/02/14 0358 04/03/14 0500  NA 145 146 146 143 141 145  K 3.6* 3.5* 3.0* 3.4* 4.1 4.2  CL 112 113* 113* 112 112 116*  CO2 19 19 18*  18* 18* 17*  GLUCOSE 77 167* 113* 197* 229* 263*  BUN 67* 74* 80* 82* 84* 88*  CREATININE 1.55* 1.63* 1.55* 1.38* 1.35* 1.31*  CALCIUM 7.8* 8.0* 7.7* 7.6* 7.5* 8.0*  MG 2.3 2.3 2.1  2.2  --  1.9 1.9  PHOS 4.7* 5.0* 4.1  --  3.2 3.5   Liver Function Tests:  Recent Labs Lab 03/29/14 1120 03/30/14 0400 03/31/14 0435 04/01/14 0613 04/02/14 0358  AST 35 25 26 26  33  ALT <5 <5 <5 <5 <5  ALKPHOS 309* 245* 260* 264* 295*  BILITOT 1.3* 0.9 0.9 0.8 0.6  PROT 5.3* 5.1* 5.2* 5.2* 4.8*  ALBUMIN 2.4* 2.4* 2.3* 2.1* 1.8*   No results for input(s): LIPASE, AMYLASE in the last 168 hours. No results for input(s): AMMONIA in the last 168 hours. CBC:  Recent Labs Lab 03/30/14 0400 03/31/14 0435 04/01/14 0613 04/02/14 0358 04/02/14 2000 04/03/14 0500  WBC 3.4* 4.1 4.0 3.2*  --  2.9*  NEUTROABS 2.9  --   --   --   --   --   HGB 8.2* 8.9* 8.5* 7.5* 9.0* 8.1*  HCT 25.2* 27.9* 26.5* 24.0* 28.7* 25.5*  MCV 89.0 90.3 91.1 92.3  --  91.7  PLT 63* 64* 51* 47*  --  46*   Cardiac Enzymes:  Recent Labs Lab 04/02/14 2000  CKTOTAL 17  BNP (last 3 results) No results for input(s): PROBNP in the last 8760 hours. CBG:  Recent Labs Lab 04/03/14 0404 04/03/14 0733 04/03/14 1135 04/03/14 1614 04/03/14 2041  GLUCAP 233* 211* 205* 257* 206*    Recent Results (from the past 240 hour(s))  Body fluid culture     Status: None   Collection Time: 03/25/14  6:37 PM  Result Value Ref Range Status   Specimen Description SYNOVIAL RIGHT KNEE  Final   Special Requests NONE  Final   Gram Stain   Final    FEW WBC PRESENT,BOTH PMN AND MONONUCLEAR NO ORGANISMS SEEN Performed at [facility]    Culture   Final    NO GROWTH 3 DAYS Performed at [facility]    Report Status 03/29/2014 FINAL  Final  Body fluid culture     Status: None   Collection Time: 03/25/14  6:46 PM  Result Value Ref Range Status   Specimen Description SYNOVIAL LEFT KNEE  Final   Special Requests NONE   Final   Gram Stain   Final    ABUNDANT WBC PRESENT,BOTH PMN AND MONONUCLEAR NO ORGANISMS SEEN Performed at [facility]    Culture   Final    RARE STAPHYLOCOCCUS AUREUS Note: RIFAMPIN AND GENTAMICIN SHOULD NOT BE USED AS SINGLE DRUGS FOR TREATMENT OF STAPH INFECTIONS. CALLED TO KIMBERLY OKAFOR 03/30/14 1315 BY SMITHERSJ Performed at [facility]    Report Status 03/31/2014 FINAL  Final   Organism ID, Bacteria STAPHYLOCOCCUS AUREUS  Final      Susceptibility   Staphylococcus aureus - MIC*    CLINDAMYCIN <=0.25 SENSITIVE Sensitive     ERYTHROMYCIN <=0.25 SENSITIVE Sensitive     GENTAMICIN <=0.5 SENSITIVE Sensitive     LEVOFLOXACIN 0.25 SENSITIVE Sensitive     OXACILLIN 0.5 SENSITIVE Sensitive     PENICILLIN 0.12 SENSITIVE Sensitive     RIFAMPIN <=0.5 SENSITIVE Sensitive     TRIMETH/SULFA <=10 SENSITIVE Sensitive     VANCOMYCIN <=0.5 SENSITIVE Sensitive     TETRACYCLINE <=1 SENSITIVE Sensitive     MOXIFLOXACIN <=0.25 SENSITIVE Sensitive     * RARE STAPHYLOCOCCUS AUREUS     Studies: Ct Chest W Contrast  04/03/2014   CLINICAL DATA:  Esophageal abnormality an unintentional weight loss.  EXAM: CT CHEST, ABDOMEN, AND PELVIS WITH CONTRAST  TECHNIQUE: Multidetector CT imaging of the chest, abdomen and pelvis was performed following the standard protocol during bolus administration of intravenous contrast.  CONTRAST:  80mL OMNIPAQUE IOHEXOL 300 MG/ML  SOLN  COMPARISON:  None.  FINDINGS: CT CHEST FINDINGS  THORACIC INLET/BODY WALL:  Anasarca as noted previously. Negative thyroid. There is a right upper extremity PICC with tip at the SVC.  MEDIASTINUM:  Mild cardiomegaly. There is a trace pericardial effusion which is stable. Coronary atherosclerosis is present. No acute vascular findings. No lymphadenopathy. There is fluid right of the distal esophagus, herniated to the hiatus.  LUNG WINDOWS:  Unchanged small pleural effusions, left greater than right. There is complete collapse  of the left lower lobe. Atelectasis in the right lower lobe is subsegmental. No edema or definitive pneumonia.  OSSEOUS:  See below.  CT ABDOMEN AND PELVIS FINDINGS  BODY WALL: Anasarca  Liver: No focal abnormality.  Biliary: The gallbladder is distended and mildly high density. Reportedly the patient is on TPN in this is likely from fasting state. There is no definitive surrounding inflammation when accounting for ascites. No report of abdominal pain to suggest cholecystitis. A gallstone  is again noted.  Pancreas: Edema around the pancreas is likely related to the patient's generalized edematous state rather than pancreatitis.  Spleen: Splenic enlargement, measuring up to 14 cm in maximal axial dimension. There are fairly circumscribed subcapsular and roughly wedge-shaped perfusion defects compatible with infarcts. No indication of rupture.  Adrenals: Unremarkable.  Kidneys and ureters: No hydronephrosis or stone.  Bladder: Decompressed by Foley catheter  Reproductive: 2 uterine fibroids, measuring up to 4.5 cm on the left. The left uterine fibroid deforms the endometrium at the level of the uterine horn.  Bowel: There is a prominent volume of rectal stool, similar to previous. Mesorectal fat infiltration could be from pressure changes or the edematous state. No evidence of bowel obstruction or perforation. Negative appendix.  Retroperitoneum: No mass or adenopathy.  Peritoneum: Ascites which appears simple but increased, now moderate volume.  Vascular: No acute abnormality.  OSSEOUS: There is a peripherally enhancing fluid collection around the greater trochanter on the right which is unchanged from previous. There is a small volume of fluid extending posteriorly around the proximal right femur, which may be new from prior or not imaged previously.  Bridging osteophytes throughout the thoracic and upper lumbar spine.  IMPRESSION: 1. No definitive malignancy within the chest, abdomen, or pelvis. 2. Splenomegaly  with multiple infarcts, unchanged from 03/28/2014. 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 3. Volume overload with anasarca, pleural effusions, and ascites. Ascites is increased over the past 5 days, now moderate. 4. Progressive atelectasis, now with complete collapse of the left lower lobe. 5. Probable rectal impaction. 6. Unchanged fluid collection around the right greater trochanter, likely bursitis. 7. Cholelithiasis. Gallbladder distention is likely from fasting state in this patient on TPN.   Electronically Signed   By: Tiburcio Pea M.D.   On: 04/03/2014 04:00   Ct Abdomen Pelvis W Contrast  04/03/2014   CLINICAL DATA:  Esophageal abnormality an unintentional weight loss.  EXAM: CT CHEST, ABDOMEN, AND PELVIS WITH CONTRAST  TECHNIQUE: Multidetector CT imaging of the chest, abdomen and pelvis was performed following the standard protocol during bolus administration of intravenous contrast.  CONTRAST:  44mL OMNIPAQUE IOHEXOL 300 MG/ML  SOLN  COMPARISON:  None.  FINDINGS: CT CHEST FINDINGS  THORACIC INLET/BODY WALL:  Anasarca as noted previously. Negative thyroid. There is a right upper extremity PICC with tip at the SVC.  MEDIASTINUM:  Mild cardiomegaly. There is a trace pericardial effusion which is stable. Coronary atherosclerosis is present. No acute vascular findings. No lymphadenopathy. There is fluid right of the distal esophagus, herniated to the hiatus.  LUNG WINDOWS:  Unchanged small pleural effusions, left greater than right. There is complete collapse of the left lower lobe. Atelectasis in the right lower lobe is subsegmental. No edema or definitive pneumonia.  OSSEOUS:  See below.  CT ABDOMEN AND PELVIS FINDINGS  BODY WALL: Anasarca  Liver: No focal abnormality.  Biliary: The gallbladder is distended and mildly high density. Reportedly the patient is on TPN in this is likely from fasting state. There  is no definitive surrounding inflammation when accounting for ascites. No report of abdominal pain to suggest cholecystitis. A gallstone is again noted.  Pancreas: Edema around the pancreas is likely related to the patient's generalized edematous state rather than pancreatitis.  Spleen: Splenic enlargement, measuring up to 14 cm in maximal axial dimension. There are fairly circumscribed subcapsular and roughly wedge-shaped perfusion defects compatible with infarcts. No indication of rupture.  Adrenals: Unremarkable.  Kidneys and ureters: No hydronephrosis or stone.  Bladder: Decompressed by Foley catheter  Reproductive: 2 uterine fibroids, measuring up to 4.5 cm on the left. The left uterine fibroid deforms the endometrium at the level of the uterine horn.  Bowel: There is a prominent volume of rectal stool, similar to previous. Mesorectal fat infiltration could be from pressure changes or the edematous state. No evidence of bowel obstruction or perforation. Negative appendix.  Retroperitoneum: No mass or adenopathy.  Peritoneum: Ascites which appears simple but increased, now moderate volume.  Vascular: No acute abnormality.  OSSEOUS: There is a peripherally enhancing fluid collection around the greater trochanter on the right which is unchanged from previous. There is a small volume of fluid extending posteriorly around the proximal right femur, which may be new from prior or not imaged previously.  Bridging osteophytes throughout the thoracic and upper lumbar spine.  IMPRESSION: 1. No definitive malignancy within the chest, abdomen, or pelvis. 2. Splenomegaly with multiple infarcts, unchanged from 03/28/2014. 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 3. Volume overload with anasarca, pleural effusions, and ascites. Ascites is increased over the past 5 days, now moderate. 4. Progressive atelectasis, now with complete  collapse of the left lower lobe. 5. Probable rectal impaction. 6. Unchanged fluid collection around the right greater trochanter, likely bursitis. 7. Cholelithiasis. Gallbladder distention is likely from fasting state in this patient on TPN.   Electronically Signed   By: Tiburcio Pea M.D.   On: 04/03/2014 04:00    Scheduled Meds: . antiseptic oral rinse  7 mL Mouth Rinse q12n4p  . chlorhexidine  15 mL Mouth Rinse BID  . DAPTOmycin (CUBICIN)  IV  500 mg Intravenous Q24H  . diazepam  5 mg Intravenous QHS  . feeding supplement (ENSURE COMPLETE)  237 mL Oral TID BM  . fentaNYL  12.5 mcg Transdermal Q72H  . fluconazole (DIFLUCAN) IV  200 mg Intravenous Q24H  . hydrocortisone cream   Topical BID  . insulin glargine  10 Units Subcutaneous Daily  . magic mouthwash w/lidocaine  10 mL Oral TID  . methylPREDNISolone (SOLU-MEDROL) injection  40 mg Intravenous Daily  . metoprolol tartrate  12.5 mg Oral q morning - 10a  . mirtazapine  15 mg Oral QHS  . modafinil  100 mg Oral Daily  . nystatin  5 mL Oral QID  . ondansetron (ZOFRAN) IV  4 mg Intravenous 4 times per day  . oxymetazoline  1 spray Each Nare BID  . pantoprazole (PROTONIX) IV  40 mg Intravenous Q24H  . sodium chloride  10-40 mL Intracatheter Q12H   Continuous Infusions: . sodium chloride 40 mL/hr at 04/02/14 1800    Active Problems:   DM2 (diabetes mellitus, type 2)   Physical deconditioning   Hypoalbuminemia   Hyponatremia   Severe sepsis   Acute UTI   Protein-calorie malnutrition, severe   Sacral decubitus ulcer   Acute pulmonary edema   Effusion into joint   ST elevation myocardial infarction (STEMI) of inferolateral wall, initial episode of care   Severe sepsis with septic shock   Diabetic ulcer of left foot associated with diabetes mellitus due to underlying condition   Osteomyelitis   Ulcer of heel   Acute osteomyelitis of femur   Septic arthritis of knee, left   STEMI (ST elevation myocardial infarction)   Ulcer  of left heel   Metabolic acidosis   Bacteremia   Knee pain  Ileus   Nausea with vomiting   Palliative care encounter   Appetite loss   Felty syndrome    Time spent: 15 minutes. Greater than 50% of this time was spent in direct contact with the patient coordinating care.    Chaya Jan  [facility] Pager [phone]  If 7PM-7AM, please contact night-coverage at [email or web address], password [facility] 04/04/2014, 5:04 PM  LOS: 16 days

## 2014-04-04 NOTE — Progress Notes (Signed)
Palliative Medicine Team Progress Note  Followed up with patient and her husband this AM. She has profuse nose bleeding last night. While she is more alert and talks to me today- she still cannot be touched without yelling out and significant distress.   I spoke openly with Enid Derry about hospice- she told me she knew how sick she was and told me she appreciated my honesty- she says the unknown terrifies her. I explained what a comfort care approach would look like - I feel this is very best option. SNF will not be able to handle her bleeding issues, her pain and anxiety. She has done very well with the scheduled diazepam dosing.  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 STEMI  STEMI on admission to [facility] for any interventions 3. Septic Knee/Infected wounds  IV antibiotics 4. Malnutrition  TPN discontinued- very poor PO intake 5. Thrush  Severe oral lesions-using diflucan 6. Wounds/ Septic Emboli vs. Autoimmune mediated-prior [facility] +blood cultures-cannot undergo TEE  Jannessa is declining physically-while she is mentally more alert her clinical deterioration is obvious- she is having profuse nose bleeding now and persistent vaginal bleeding. I discussed tranfusion limitations with her husband-I asked him if we really should continue to transfuse her given the fact that we have no way top stop the bleeding and she is in agony.He agrees to no additional tranfusions and that he doesn't want her to suffer.  Mr. Sedlak has difficulty understanding her medical picture- he isn't always correct in his assessment of "better" - he often tells providers what he thinks they want to hear not what is actually happening- this is from a place of hope and also denial about the severity of Korey's condition.  She is very appropriate for hospice facility care- she has very  difficult to control symptoms and is most likely nearing EOL.   Time: 9AM-10AM Total 60 minutes Anderson Malta, [place] [facility] [phone]

## 2014-04-04 NOTE — Progress Notes (Signed)
Received report from [facility] and agree with assessment.

## 2014-04-05 LAB — GLUCOSE, CAPILLARY
GLUCOSE-CAPILLARY: 163 mg/dL — AB (ref 70–99)
Glucose-Capillary: 110 mg/dL — ABNORMAL HIGH (ref 70–99)
Glucose-Capillary: 114 mg/dL — ABNORMAL HIGH (ref 70–99)
Glucose-Capillary: 157 mg/dL — ABNORMAL HIGH (ref 70–99)

## 2014-04-05 NOTE — Progress Notes (Signed)
ANTIBIOTIC CONSULT NOTE - FOLLOW UP  Pharmacy Consult for Daptomycin, Fluconazole Indication: MSSA Bacteremia and septic arthritis, UTI, Oral lesions  Allergies  Allergen Reactions  . Penicillins Rash    Patient Measurements: Height: 5\' 4"  (162.6 cm) Weight: 212 lb 14.4 oz (96.571 kg) IBW/kg (Calculated) : 54.7  Vital Signs: Temp: 99.1 F (37.3 C) (12/20 0430) Temp Source: Oral (12/20 0430) BP: 147/86 mmHg (12/20 0430) Pulse Rate: 94 (12/20 0430)  Labs:  Recent Labs  04/02/14 2000 04/03/14 0500  WBC  --  2.9*  HGB 9.0* 8.1*  PLT  --  46*  CREATININE  --  1.31*   Estimated Creatinine Clearance: 51.5 mL/min (by C-G formula based on Cr of 1.31).   Assessment: 44 yoF with RA and recent MSSA bacteremia 10/13, blood cx cleared on 10/16. Patient was on vancomycin per Epic records. TEE on 10/19 negative for vegetations. She was to finish 14 day course on 10/30 using 10/16 as day 1. She was discharged to SNF for IV abx and rehab. On 12/3 pt brought to ED for IVC as pt refusing care.  She was started on empiric vancomycin; changed to cefazolin (PCN allergy, but tolerates cephs) with blood cx showing MSSA and Ecoli UTI.  Planned to complete 6 wks cefazolin, but changed from Ancef to Daptomycin on 12/17 per ID due to pancytopenia possibly 2/2 Ancef.   12/3 >> Vancomycin >> 12/6 12/3 >> Aztreonam >>12/4 12/3 >> Levaquin x 1 dose 12/5 >> Cefazolin >> 12/17 12/16 >>fluconazole>> 12/17 >> Daptomycin >>  Today, 04/05/2014  No recent labs  Day #4 Daptomycin, Day #5 Fluconazole (Day #12 total antibiotics)   Goal of Therapy:  Appropriate abx dosing, eradication of infection.   Plan:   Continue Daptomycin 500mg  IV q24h  Continue Fluconazole 200mg  IV q24h  MD, please assess utility of continuing Daptomycin if plan is no lab draws and discharge to hospice without completing full course of antibiotics.   04/07/2014 PharmD, BCPS Pager [phone] 04/05/2014 12:00 PM

## 2014-04-05 NOTE — Consult Note (Signed)
Re-evaluated pt again today for Hospice [facility] at [facility] after receiving the kind re-referral from [facility] and a telephonic update from Dr Hilma Favors on 04/04/14.  Met pt at bedside to re-eval and met with husband, Claudie Leach, in the 4th floor conference room.  Discussed Hosp[facility] Home at [facility] services at length.  He is interested in placing pt at [facility] with a comfort approach.  Had a lengthy conversation with Hospice Medical Director, Dr Estill Cotta.  She is not ready to make a determination that pt is clinically appropriate to transfer to [place] today.  Will send a liaison back tomorrow to re-evaluate pt's condition again.  Update given to [facility] staff and [facility].  Thank you, Wynetta Fines, BSN [phone]

## 2014-04-05 NOTE — Progress Notes (Signed)
Chaplain follow up visit. Ms Cohenour was asleep. Chaplains will follow up as paged.  Benjie Karvonen. Maleah Rabago, DMin Chaplain

## 2014-04-05 NOTE — Progress Notes (Signed)
Robin Schroeder continue to refuse PO intake or her medication, also Robin Schroeder noted to have some bright red blood from virginal area and alittle from oral , Informed Dr Phillips Odor, no new order given, will continue to assess Robin Schroeder.

## 2014-04-05 NOTE — Progress Notes (Addendum)
Chaplain rounding visit to Robin Schroeder who has requested daily visits from spiritual care providers. Robin Schroeder is beginning to understand that her declining condition is leading more quickly toward death. She is very fearful of death and wishes assurances from spiritual care providers that she is loved and accepted in this life.  Prayer requested and given based on her wishes to communicate with God.  Page weekend chaplain at any time should Robin Schroeder become anxious, exhibit panic, or in need of someone to listen to her longer than a staff member can afford the time to sit with her.  Benjie Karvonen. Trygg Mantz, DMin Chaplain

## 2014-04-05 NOTE — Progress Notes (Signed)
[facility] PROGRESS NOTE  Robin Schroeder WSF:[id number] DOB: 1956-04-11 DOA: 03/19/2014 PCP: No PCP Per Patient  Assessment/Plan: Septic shock secondary to MSSA bacteremia Septic arthritis of left knee  STEMI Rheumatoid arthritis Hypertension  type 2 diabetes Multiple pressure wounds  severe deconditioning  severe protein caloric malnutrition  Patient continues to physically decline. Appreciate detailed consultation note by palliative care. Note that patient has elected to discontinue all medical treatments and to pursue hospice placement. Apparently patient was assessed by a hospice home today and not deemed an adequate candidate (unsure why?). SW continues to follow for dispo.     Code Status: DO NOT RESUSCITATE Family Communication: Patient only  Disposition Plan: To hospice home once that available    Consultants:  Palliative care   Antibiotics:  None    Subjective: Patient continues to have nose and vaginal bleeding. Does not allow physical exam   Objective: Filed Vitals:   04/04/14 1440 04/04/14 2232 04/05/14 0430 04/05/14 1423  BP: 138/66 140/63 147/86 142/74  Pulse: 74 62 94 109  Temp: 98 F (36.7 C) 98.4 F (36.9 C) 99.1 F (37.3 C) 98.9 F (37.2 C)  TempSrc: Oral Oral Oral Oral  Resp: 20 20 20 20   Height:      Weight:   96.571 kg (212 lb 14.4 oz)   SpO2: 99% 98% 98% 98%    Intake/Output Summary (Last 24 hours) at 04/05/14 1611 Last data filed at 04/05/14 1425  Gross per 24 hour  Intake   1190 ml  Output   1325 ml  Net   -135 ml   Filed Weights   04/03/14 0507 04/04/14 0610 04/05/14 0430  Weight: 94.711 kg (208 lb 12.8 oz) 96.888 kg (213 lb 9.6 oz) 96.571 kg (212 lb 14.4 oz)    Exam:  Patient refuses to be examined   Data Reviewed: Basic Metabolic Panel:  Recent Labs Lab 03/30/14 0400 03/31/14 0435 04/01/14 0613 04/02/14 0358 04/03/14 0500  NA 146 146 143 141 145  K 3.5* 3.0* 3.4* 4.1 4.2  CL 113* 113* 112 112  116*  CO2 19 18* 18* 18* 17*  GLUCOSE 167* 113* 197* 229* 263*  BUN 74* 80* 82* 84* 88*  CREATININE 1.63* 1.55* 1.38* 1.35* 1.31*  CALCIUM 8.0* 7.7* 7.6* 7.5* 8.0*  MG 2.3 2.1  2.2  --  1.9 1.9  PHOS 5.0* 4.1  --  3.2 3.5   Liver Function Tests:  Recent Labs Lab 03/30/14 0400 03/31/14 0435 04/01/14 0613 04/02/14 0358  AST 25 26 26  33  ALT <5 <5 <5 <5  ALKPHOS 245* 260* 264* 295*  BILITOT 0.9 0.9 0.8 0.6  PROT 5.1* 5.2* 5.2* 4.8*  ALBUMIN 2.4* 2.3* 2.1* 1.8*   No results for input(s): LIPASE, AMYLASE in the last 168 hours. No results for input(s): AMMONIA in the last 168 hours. CBC:  Recent Labs Lab 03/30/14 0400 03/31/14 0435 04/01/14 0613 04/02/14 0358 04/02/14 2000 04/03/14 0500  WBC 3.4* 4.1 4.0 3.2*  --  2.9*  NEUTROABS 2.9  --   --   --   --   --   HGB 8.2* 8.9* 8.5* 7.5* 9.0* 8.1*  HCT 25.2* 27.9* 26.5* 24.0* 28.7* 25.5*  MCV 89.0 90.3 91.1 92.3  --  91.7  PLT 63* 64* 51* 47*  --  46*   Cardiac Enzymes:  Recent Labs Lab 04/02/14 2000  CKTOTAL 17   BNP (last 3 results) No results for input(s): PROBNP in the last 8760  hours. CBG:  Recent Labs Lab 04/03/14 2041 04/04/14 1747 04/04/14 2229 04/05/14 0806 04/05/14 1157  GLUCAP 206* 210* 161* 110* 114*    No results found for this or any previous visit (from the past 240 hour(s)).   Studies: No results found.  Scheduled Meds: . antiseptic oral rinse  7 mL Mouth Rinse q12n4p  . chlorhexidine  15 mL Mouth Rinse BID  . DAPTOmycin (CUBICIN)  IV  500 mg Intravenous Q24H  . diazepam  5 mg Intravenous QHS  . feeding supplement (ENSURE COMPLETE)  237 mL Oral TID BM  . fentaNYL  12.5 mcg Transdermal Q72H  . fluconazole (DIFLUCAN) IV  200 mg Intravenous Q24H  . hydrocortisone cream   Topical BID  . insulin glargine  10 Units Subcutaneous Daily  . magic mouthwash w/lidocaine  10 mL Oral TID  . methylPREDNISolone (SOLU-MEDROL) injection  40 mg Intravenous Daily  . metoprolol tartrate  12.5 mg Oral  q morning - 10a  . mirtazapine  15 mg Oral QHS  . modafinil  100 mg Oral Daily  . nystatin  5 mL Oral QID  . ondansetron (ZOFRAN) IV  4 mg Intravenous 4 times per day  . oxymetazoline  1 spray Each Nare BID  . pantoprazole (PROTONIX) IV  40 mg Intravenous Q24H  . sodium chloride  10-40 mL Intracatheter Q12H   Continuous Infusions: . sodium chloride 40 mL/hr at 04/04/14 2211    Active Problems:   DM2 (diabetes mellitus, type 2)   Physical deconditioning   Hypoalbuminemia   Hyponatremia   Severe sepsis   Acute UTI   Protein-calorie malnutrition, severe   Sacral decubitus ulcer   Acute pulmonary edema   Effusion into joint   ST elevation myocardial infarction (STEMI) of inferolateral wall, initial episode of care   Severe sepsis with septic shock   Diabetic ulcer of left foot associated with diabetes mellitus due to underlying condition   Osteomyelitis   Ulcer of heel   Acute osteomyelitis of femur   Septic arthritis of knee, left   STEMI (ST elevation myocardial infarction)   Ulcer of left heel   Metabolic acidosis   Bacteremia   Knee pain   Ileus   Nausea with vomiting   Palliative care encounter   Appetite loss   Felty syndrome    Time spent: 15 minutes. Greater than 50% of this time was spent in direct contact with the patient coordinating care.    Chaya Jan  [facility] Pager [phone]  If 7PM-7AM, please contact night-coverage at [email or web address], password [facility] 04/05/2014, 4:11 PM  LOS: 17 days

## 2014-04-05 NOTE — Clinical Social Work Note (Signed)
  CSW received a call from [place] at [facility] who had assessed pt this morning  Chyrl Civatte stated that she had presented her assessment to her medical director and they decided that pt was not appropriate for in patient in [place] at this time.   Chyrl Civatte stated that they would reassess pt tomorrow to see if they could qualify her for in patient status  Chyrl Civatte stated that she had spoken to pt's husband and told him this information and that he was ok with pt being reassessed tomorrow  CSW will continue to monitor pt until discharge  .Elray Buba, LCSW [facility] Clinical Social Worker - Weekend Coverage cell #: [phone]

## 2014-04-05 NOTE — Progress Notes (Signed)
Palliative Medicine Team Progress Note  Robin Schroeder continues to decline. She is more confused today-signs of delirium. She started talking about giving her things away when I gently awoke her from sleep. RN reports continued profuse bleeding- vaginal, nasal bleeding, gums/mouth and her wounds are bleeding and weeping. Skin with evidence of diffuse bruising. She reports that her pain is better but when I gently place my hand on her skin she guards. She continues to not take anything by mouth and refuses any and all PO medications.  Discussed her care with RN- they have been medicating her prior to any drg changes, movement or repositioning with IV medication.  Filed Vitals:   04/05/14 1423  BP: 142/74  Pulse: 109  Temp: 98.9 F (37.2 C)  Resp: 20    Assessment:  Robin Schroeder is dying of complications related to her RA/Felty's Syndrome -recurrent infections, thrombocytopenia with persistent blood loss, anasarca with malnutrition, probable septic emboli causing non-healing wounds and severe depression with anxiety-I also question cognitive delay at baseline. She will not be able to overcome her current condition.  Recommendations:  1. Would continue the scheduled diazepam for now 2. Continue premedication with IV dilaudid prior to dressing changes- maintain 12.5 Duragesic 3. She is not taking any of the order PO meds-yesterday was the only day that she had taken the Provigil and it was actually one of the best days that I had seen her and was able to talk with her. Very difficult to work with her symptoms - but fortunately yesterday I was actually able to talk with her and get an idea of her goals of care when she was more alert and appropriate. 4. Continue scheduled Afrin for her persistent nose bleeding. 5. Etiology of her vaginal bleeding is likely from Fibroids shown on CT-few options to stop this bleeding since she recently had STEMI.  She remains from my acute care perspective an appropriate  referral for Hospice Facility care-if [facility] does not feel she is appropriate I would strongly recommend full hospice at SNF -she simply will not be able to rehab and is essentially bedbound.  Prognosis: days-weeks  (for prognostication purposes only we could consider obtaining additional blood work to show severe anemia and thrombocytopenia)  Will follow.  Anderson Malta, DO Palliative Medicine [phone]

## 2014-04-06 DIAGNOSIS — R52 Pain, unspecified: Secondary | ICD-10-CM | POA: Insufficient documentation

## 2014-04-06 LAB — GLUCOSE, CAPILLARY
GLUCOSE-CAPILLARY: 138 mg/dL — AB (ref 70–99)
Glucose-Capillary: 126 mg/dL — ABNORMAL HIGH (ref 70–99)

## 2014-04-06 MED ORDER — ONDANSETRON HCL 4 MG/2ML IJ SOLN: 4.0000 mg | Freq: Four times a day (QID) | INTRAMUSCULAR | Status: AC | PRN

## 2014-04-06 MED ORDER — FENTANYL 12 MCG/HR TD PT72: 12.5000 ug | MEDICATED_PATCH | TRANSDERMAL | Status: AC

## 2014-04-06 MED ORDER — METHYLPREDNISOLONE SODIUM SUCC 40 MG IJ SOLR
20.0000 mg | Freq: Every day | INTRAMUSCULAR | Status: DC
  Administered 2014-04-06: 20 mg via INTRAVENOUS
  Filled 2014-04-06: qty 0.5

## 2014-04-06 MED ORDER — OXYMETAZOLINE HCL 0.05 % NA SOLN: 1.0000 | Freq: Two times a day (BID) | NASAL | Status: AC

## 2014-04-06 MED ORDER — LORAZEPAM 2 MG/ML PO CONC: 2.0000 mg | ORAL | Status: AC | PRN

## 2014-04-06 MED ORDER — HEPARIN SOD (PORK) LOCK FLUSH 100 UNIT/ML IV SOLN
250.0000 [IU] | INTRAVENOUS | Status: DC | PRN
  Administered 2014-04-06: 13:00:00

## 2014-04-06 MED ORDER — NYSTATIN 100000 UNIT/ML MT SUSP: 5.0000 mL | Freq: Four times a day (QID) | OROMUCOSAL | Status: AC

## 2014-04-06 MED ORDER — DIAZEPAM 5 MG PO TABS: 5.0000 mg | ORAL_TABLET | Freq: Four times a day (QID) | ORAL | Status: AC | PRN

## 2014-04-06 MED ORDER — ONDANSETRON HCL 4 MG PO TABS
4.0000 mg | ORAL_TABLET | Freq: Once | ORAL | Status: AC
  Administered 2014-04-06: 4 mg via ORAL
  Filled 2014-04-06: qty 1

## 2014-04-06 MED ORDER — MORPHINE SULFATE (CONCENTRATE) 10 MG /0.5 ML PO SOLN: 5.0000 mg | Freq: Four times a day (QID) | ORAL | Status: AC | PRN

## 2014-04-06 MED ORDER — DIAZEPAM 5 MG PO TABS
5.0000 mg | ORAL_TABLET | Freq: Once | ORAL | Status: AC
  Administered 2014-04-06: 5 mg via ORAL
  Filled 2014-04-06: qty 1

## 2014-04-06 MED ORDER — MODAFINIL 100 MG PO TABS: 100.0000 mg | ORAL_TABLET | Freq: Every day | ORAL | Status: AC

## 2014-04-06 MED ORDER — PANTOPRAZOLE SODIUM 40 MG PO TBEC: 40.0000 mg | DELAYED_RELEASE_TABLET | Freq: Every day | ORAL | Status: AC

## 2014-04-06 MED ORDER — ACETAMINOPHEN 325 MG PO TABS: 650.0000 mg | ORAL_TABLET | Freq: Four times a day (QID) | ORAL | Status: AC | PRN

## 2014-04-06 MED ORDER — CETYLPYRIDINIUM CHLORIDE 0.05 % MT LIQD: 7.0000 mL | Freq: Two times a day (BID) | OROMUCOSAL | Status: AC

## 2014-04-06 MED ORDER — MORPHINE SULFATE (CONCENTRATE) 10 MG/0.5ML PO SOLN
10.0000 mg | Freq: Once | ORAL | Status: AC
  Administered 2014-04-06: 10 mg via ORAL
  Filled 2014-04-06: qty 0.5

## 2014-04-06 NOTE — Discharge Summary (Addendum)
Physician Discharge Summary  Robin Schroeder MRN: [id number] DOB/AGE: December 04, 1955 58 y.o.  PCP: No PCP Per Patient   Admit date: 03/19/2014 Discharge date: 04/06/2014  Discharge Diagnoses:  Hospice eligible Epistaxis    DM2 (diabetes mellitus, type 2)   Physical deconditioning   Hypoalbuminemia   Hyponatremia   Severe sepsis   Acute UTI   Protein-calorie malnutrition, severe   Sacral decubitus ulcer   Acute pulmonary edema   Effusion into joint   ST elevation myocardial infarction (STEMI) of inferolateral wall, initial episode of care   Severe sepsis with septic shock   Diabetic ulcer of left foot associated with diabetes mellitus due to underlying condition   Osteomyelitis   Ulcer of heel   Acute osteomyelitis of femur   Septic arthritis of knee, left   STEMI (ST elevation myocardial infarction)   Ulcer of left heel   Metabolic acidosis   Bacteremia   Knee pain   Ileus   Nausea with vomiting   Palliative care encounter   Appetite loss   Felty syndrome   Follow-up recommendations Patient as been  deemed hospice illegible by multiple providers and being discharged to hospice house No further hospitalizations, no further labs, discussed with patient's spouse prior to transfer     Medication List    STOP taking these medications        calcium carbonate 600 MG Tabs tablet  Commonly known as:  OS-CAL     furosemide 40 MG tablet  Commonly known as:  LASIX     oxyCODONE 5 MG immediate release tablet  Commonly known as:  Oxy IR/ROXICODONE     Vitamin D3 5000 UNITS Tabs      TAKE these medications        acetaminophen 325 MG tablet  Commonly known as:  TYLENOL  Take 2 tablets (650 mg total) by mouth every 6 (six) hours as needed for mild pain (or Fever >/= 101).     antiseptic oral rinse 0.05 % Liqd solution  Commonly known as:  CPC / CETYLPYRIDINIUM CHLORIDE 0.05%  7 mLs by Mouth Rinse route 2 times daily at 12 noon and 4 pm.     diazepam 5 MG  tablet  Commonly known as:  VALIUM  Take 1 tablet (5 mg total) by mouth every 6 (six) hours as needed for anxiety.     fentaNYL 12 MCG/HR  Commonly known as:  DURAGESIC - dosed mcg/hr  Place 1 patch (12.5 mcg total) onto the skin every 3 (three) days.     LORazepam 2 MG/ML concentrated solution  Commonly known as:  LORAZEPAM INTENSOL  Take 1 mL (2 mg total) by mouth every 4 (four) hours as needed for anxiety.     modafinil 100 MG tablet  Commonly known as:  PROVIGIL  Take 1 tablet (100 mg total) by mouth daily.     morphine CONCENTRATE 10 mg / 0.5 ml concentrated solution  Place 0.25 mLs (5 mg total) under the tongue every 6 (six) hours as needed for severe pain.     nystatin 100000 UNIT/ML suspension  Commonly known as:  MYCOSTATIN  Take 5 mLs (500,000 Units total) by mouth 4 (four) times daily.     ondansetron 4 MG/2ML Soln injection  Commonly known as:  ZOFRAN  Inject 2 mLs (4 mg total) into the vein every 6 (six) hours as needed for nausea.     oxymetazoline 0.05 % nasal spray  Commonly known as:  Melissa Montane  Place 1 spray into both nostrils 2 (two) times daily.     pantoprazole 40 MG tablet  Commonly known as:  PROTONIX  Take 1 tablet (40 mg total) by mouth daily.     polyethylene glycol packet  Commonly known as:  MIRALAX / GLYCOLAX  Take 17 g by mouth daily as needed for moderate constipation.     predniSONE 10 MG tablet  Commonly known as:  DELTASONE  Take 10 mg by mouth daily.        Discharge Condition:  Disposition: 03-Skilled Nursing Facility   Consultants:  Cardiology  ID  Psychiatry  PCCM-Signed off     Palliative care  Significant Diagnostic Studies: Ct Abdomen Pelvis Wo Contrast  03/28/2014   CLINICAL DATA:  patient with history of hypertension, obesity, DM, chronic diastolic CHF, possible rheumatoid arthritis, recent hospitalization for MSSA bacteremia of unknown source/neg TEE- cleared after Vancomycin (PCN allergy), went to SNF and then  discharged from there to home, presented with complaints of not eating or drinking 4 days and husband was concerned that patient was trying to hurt herself and she was IVC and brought to ED where she was assessed as sepsis of unclear source and admitted to stepdown for further management. Patient went into septic shock requiring pressors.  Patient with renal failure.  No contrast utilized.  EXAM: CT ABDOMEN AND PELVIS WITHOUT CONTRAST  TECHNIQUE: Multidetector CT imaging of the abdomen and pelvis was performed following the standard protocol without IV contrast.  COMPARISON:  None.  FINDINGS: Small bilateral pleural effusions, larger on the left. There is dependent lower lobe opacity, also greater on the left, most likely atelectasis. Left lower lobe pneumonia should be considered given history. There is no pulmonary edema. Heart is normal in size.  There is small amount of low-density material that lies adjacent to the distal esophagus in the lower mediastinum just above the esophageal hiatus. This measures 3.2 cm x 2.7 cm x 2.8 cm. Is likely a focal collection of fluid. It could reflect an enlarged lymph node.  Unremarkable liver.  Gallbladder is distended. A single gallstone lies in the dependent aspect of the gallbladder. There is no gallbladder wall thickening or convincing pericholecystic inflammation. No bile duct dilation or stone.  Abnormal spleen. Spleen is enlarged measuring 17.7 cm x 6.7 cm x 14.5 cm. There are irregular low-density areas throughout the spleen. These may reflect areas of infarction are nonspecific on this unenhanced study.  Pancreas is partly fatty replaced but otherwise unremarkable. No adrenal masses.  Ill-defined low-density lesion arises from the posterior midpole of the left kidney measuring 2.6 cm. This is likely a cyst. No other renal masses, no stones and no hydronephrosis. Normal ureters. Bladder is decompressed by a Foley catheter.  Uterus is unremarkable.  No adnexal masses.   No pathologically enlarged lymph nodes.  Small to moderate amount of ascites.  No evidence of an abscess.  No colon or small bowel wall thickening or dilation. No bowel inflammatory change. Normal appendix visualized.  Mild atherosclerotic calcifications noted along a normal caliber abdominal aorta and its branch vessels.  There is diffuse subcutaneous soft tissue edema.  Marked bilateral concentric hip joint space narrowing. Mild degenerative changes noted of the visualized spine. Bones are demineralized. No osteoblastic or osteolytic lesions.  IMPRESSION: 1. Small, left greater right, bilateral pleural effusions, small to moderate ascites and diffuse subcutaneous edema. Findings reflect anasarca of unclear etiology. 2. Left lower lobe lung base opacity, which may all be atelectasis. Consider  pneumonia given the provided history. 3. Abnormal spleen. Spleen is enlarged to a maximum of 17.7 cm. It shows multiple hypo attenuating areas that may reflect infarcts but are nonspecific. Cause of the splenomegaly is unclear. Possible etiologies include infection, hemolytic anemias, neoplastic disease is including leukemia and lymphoma and areas metabolic disorders. Increased venous drainage pressure such as from splenic vein thrombosis should also be considered. 4. Other chronic findings as detailed. No other evidence of a source of infection.   Electronically Signed   By: Lajean Manes M.D.   On: 03/28/2014 11:46   Dg Abd 1 View  03/27/2014   CLINICAL DATA:  Severe nausea and vomiting.  EXAM: ABDOMEN - 1 VIEW  COMPARISON:  03/25/2014  FINDINGS: Gaseous distension of the large bowel loops noted. A large desiccated stool ball is noted within the rectum. The patient's feeding tube is no longer visualized.  IMPRESSION: 1. Feeding tube is no longer visualized and may have been pulled back into the esophagus. 2. No change in gaseous distension of large bowel loops with desiccated stool in the rectum.   Electronically Signed    By: Kerby Moors M.D.   On: 03/27/2014 15:58   Ct Chest W Contrast  04/03/2014   CLINICAL DATA:  Esophageal abnormality an unintentional weight loss.  EXAM: CT CHEST, ABDOMEN, AND PELVIS WITH CONTRAST  TECHNIQUE: Multidetector CT imaging of the chest, abdomen and pelvis was performed following the standard protocol during bolus administration of intravenous contrast.  CONTRAST:  52m OMNIPAQUE IOHEXOL 300 MG/ML  SOLN  COMPARISON:  None.  FINDINGS: CT CHEST FINDINGS  THORACIC INLET/BODY WALL:  Anasarca as noted previously. Negative thyroid. There is a right upper extremity PICC with tip at the SVC.  MEDIASTINUM:  Mild cardiomegaly. There is a trace pericardial effusion which is stable. Coronary atherosclerosis is present. No acute vascular findings. No lymphadenopathy. There is fluid right of the distal esophagus, herniated to the hiatus.  LUNG WINDOWS:  Unchanged small pleural effusions, left greater than right. There is complete collapse of the left lower lobe. Atelectasis in the right lower lobe is subsegmental. No edema or definitive pneumonia.  OSSEOUS:  See below.  CT ABDOMEN AND PELVIS FINDINGS  BODY WALL: Anasarca  Liver: No focal abnormality.  Biliary: The gallbladder is distended and mildly high density. Reportedly the patient is on TPN in this is likely from fasting state. There is no definitive surrounding inflammation when accounting for ascites. No report of abdominal pain to suggest cholecystitis. A gallstone is again noted.  Pancreas: Edema around the pancreas is likely related to the patient's generalized edematous state rather than pancreatitis.  Spleen: Splenic enlargement, measuring up to 14 cm in maximal axial dimension. There are fairly circumscribed subcapsular and roughly wedge-shaped perfusion defects compatible with infarcts. No indication of rupture.  Adrenals: Unremarkable.  Kidneys and ureters: No hydronephrosis or stone.  Bladder: Decompressed by Foley catheter  Reproductive: 2  uterine fibroids, measuring up to 4.5 cm on the left. The left uterine fibroid deforms the endometrium at the level of the uterine horn.  Bowel: There is a prominent volume of rectal stool, similar to previous. Mesorectal fat infiltration could be from pressure changes or the edematous state. No evidence of bowel obstruction or perforation. Negative appendix.  Retroperitoneum: No mass or adenopathy.  Peritoneum: Ascites which appears simple but increased, now moderate volume.  Vascular: No acute abnormality.  OSSEOUS: There is a peripherally enhancing fluid collection around the greater trochanter on the right which is unchanged  from previous. There is a small volume of fluid extending posteriorly around the proximal right femur, which may be new from prior or not imaged previously.  Bridging osteophytes throughout the thoracic and upper lumbar spine.  IMPRESSION: 1. No definitive malignancy within the chest, abdomen, or pelvis. 2. Splenomegaly with multiple infarcts, unchanged from 03/28/2014. 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 3. Volume overload with anasarca, pleural effusions, and ascites. Ascites is increased over the past 5 days, now moderate. 4. Progressive atelectasis, now with complete collapse of the left lower lobe. 5. Probable rectal impaction. 6. Unchanged fluid collection around the right greater trochanter, likely bursitis. 7. Cholelithiasis. Gallbladder distention is likely from fasting state in this patient on TPN.   Electronically Signed   By: Jorje Guild M.D.   On: 04/03/2014 04:00   US Pelvis Complete  03/28/2014   CLINICAL DATA:  Vaginal bleeding. Patient did not allow endovaginal imaging.  EXAM: TRANSABDOMINAL ULTRASOUND OF PELVIS  TECHNIQUE: Transabdominal ultrasound examination of the pelvis was performed including evaluation of the uterus, ovaries, adnexal regions, and pelvic  cul-de-sac.  COMPARISON:  None.  FINDINGS: Uterus  Measurements: 8.7 cm x 3.8 cm x 5.5 cm. No uterine mass. Cervix is unremarkable.  Endometrium  Thickness: 14 mm.  No focal endometrial mass.  No endometrial fluid.  Right ovary  Not visualized.  No adnexal masses.  Left ovary  Not visualized.  No adnexal masses.  Other findings: The small to moderate amount of ascites is seen adjacent to the uterus and on the right lower and left lower quadrants.  IMPRESSION: 1. Thickened endometrium for this patient's age. In the setting of post-menopausal bleeding, endometrial sampling is indicated to exclude carcinoma. If results are benign, sonohysterogram should be considered for focal lesion work-up. (Ref: Radiological Reasoning: Algorithmic Workup of Abnormal Vaginal Bleeding with Endovaginal Sonography and Sonohysterography. AJR 2008; 671:I45-80) 2. No uterine masses. Neither ovary visualized. Small to moderate amount of ascites.   Electronically Signed   By: Lajean Manes M.D.   On: 03/28/2014 11:31   Ct Abdomen Pelvis W Contrast  04/03/2014   CLINICAL DATA:  Esophageal abnormality an unintentional weight loss.  EXAM: CT CHEST, ABDOMEN, AND PELVIS WITH CONTRAST  TECHNIQUE: Multidetector CT imaging of the chest, abdomen and pelvis was performed following the standard protocol during bolus administration of intravenous contrast.  CONTRAST:  32m OMNIPAQUE IOHEXOL 300 MG/ML  SOLN  COMPARISON:  None.  FINDINGS: CT CHEST FINDINGS  THORACIC INLET/BODY WALL:  Anasarca as noted previously. Negative thyroid. There is a right upper extremity PICC with tip at the SVC.  MEDIASTINUM:  Mild cardiomegaly. There is a trace pericardial effusion which is stable. Coronary atherosclerosis is present. No acute vascular findings. No lymphadenopathy. There is fluid right of the distal esophagus, herniated to the hiatus.  LUNG WINDOWS:  Unchanged small pleural effusions, left greater than right. There is complete collapse of the left lower  lobe. Atelectasis in the right lower lobe is subsegmental. No edema or definitive pneumonia.  OSSEOUS:  See below.  CT ABDOMEN AND PELVIS FINDINGS  BODY WALL: Anasarca  Liver: No focal abnormality.  Biliary: The gallbladder is distended and mildly high density. Reportedly the patient is on TPN in this is likely from fasting state. There is no definitive surrounding inflammation when accounting for ascites. No report of abdominal pain to suggest cholecystitis. A gallstone is again noted.  Pancreas: Edema around the pancreas is likely  related to the patient's generalized edematous state rather than pancreatitis.  Spleen: Splenic enlargement, measuring up to 14 cm in maximal axial dimension. There are fairly circumscribed subcapsular and roughly wedge-shaped perfusion defects compatible with infarcts. No indication of rupture.  Adrenals: Unremarkable.  Kidneys and ureters: No hydronephrosis or stone.  Bladder: Decompressed by Foley catheter  Reproductive: 2 uterine fibroids, measuring up to 4.5 cm on the left. The left uterine fibroid deforms the endometrium at the level of the uterine horn.  Bowel: There is a prominent volume of rectal stool, similar to previous. Mesorectal fat infiltration could be from pressure changes or the edematous state. No evidence of bowel obstruction or perforation. Negative appendix.  Retroperitoneum: No mass or adenopathy.  Peritoneum: Ascites which appears simple but increased, now moderate volume.  Vascular: No acute abnormality.  OSSEOUS: There is a peripherally enhancing fluid collection around the greater trochanter on the right which is unchanged from previous. There is a small volume of fluid extending posteriorly around the proximal right femur, which may be new from prior or not imaged previously.  Bridging osteophytes throughout the thoracic and upper lumbar spine.  IMPRESSION: 1. No definitive malignancy within the chest, abdomen, or pelvis. 2. Splenomegaly with multiple  infarcts, unchanged from 03/28/2014. 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 3. Volume overload with anasarca, pleural effusions, and ascites. Ascites is increased over the past 5 days, now moderate. 4. Progressive atelectasis, now with complete collapse of the left lower lobe. 5. Probable rectal impaction. 6. Unchanged fluid collection around the right greater trochanter, likely bursitis. 7. Cholelithiasis. Gallbladder distention is likely from fasting state in this patient on TPN.   Electronically Signed   By: Jorje Guild M.D.   On: 04/03/2014 04:00   Dg Chest Port 1 View  03/23/2014   CLINICAL DATA:  Pulmonary edema. Hypertension. Diabetes. Coronary artery disease.  EXAM: PORTABLE CHEST - 1 VIEW  COMPARISON:  03/21/2014  FINDINGS: Mild worsening of infiltrate seen in the left retrocardiac lung base. Mild opacity also seen in the medial right lung base is suspicious for developing infiltrate. Tiny left pleural effusion cannot be excluded Heart size remains within normal limits.  IMPRESSION: Increased infiltrate in the left retrocardiac lung base, and probably the medial right lung base as well.   Electronically Signed   By: Earle Gell M.D.   On: 03/23/2014 07:09   Dg Chest Port 1 View  03/21/2014   CLINICAL DATA:  Acute pulmonary edema. History of hypertension, diabetes and coronary artery disease. Initial encounter.  EXAM: PORTABLE CHEST - 1 VIEW  COMPARISON:  Radiographs 01/27/2014 and 03/20/2014.  FINDINGS: 1929 hr. The heart size and mediastinal contours are stable. There is persistent elevation of the left hemidiaphragm with increased left basilar airspace disease. The right lung is clear. There is no pneumothorax or significant pleural effusion. No acute osseous findings are demonstrated. Degenerative changes are present throughout the thoracic spine and at both shoulders. Telemetry leads overlie the  chest.  IMPRESSION: Progressive left lower lobe airspace disease. Although in part chronic, this could reflect superimposed aspiration. No overt pulmonary edema.   Electronically Signed   By: Camie Patience M.D.   On: 03/21/2014 08:10   Dg Chest Port 1 View  03/20/2014   CLINICAL DATA:  Fever and sepsis  EXAM: PORTABLE CHEST - 1 VIEW  COMPARISON:  March 19, 2014 study obtained earlier in the day ; January 27, 2014  FINDINGS: There is mild cardiomegaly with mild generalized interstitial edema. There is atelectatic change in the left base with small left effusion. Pulmonary vascularity is within normal limits. Bones are osteoporotic.  IMPRESSION: Cardiomegaly with interstitial edema. These findings are felt to represent a degree of congestive heart failure. Atelectasis left base. Small left effusion.   Electronically Signed   By: Lowella Grip M.D.   On: 03/20/2014 09:34   Dg Chest Port 1 View  (if Code Sepsis Called)  03/19/2014   CLINICAL DATA:  Failure to thrive  EXAM: PORTABLE CHEST - 1 VIEW  COMPARISON:  01/27/2014  FINDINGS: Patient is rotated considerably to the left and the study was not repeated. Study is significantly limited. Lungs are grossly clear.  IMPRESSION: Recommend repeat study. The patient is rotated considerably. Lungs are grossly clear.   Electronically Signed   By: Franchot Gallo M.D.   On: 03/19/2014 14:51   Dg Knee Left Port  03/20/2014   CLINICAL DATA:  Bilateral foot can knee pain, initial evaluation, multiple open wounds due to bacterial infection, bilateral knee effusions  EXAM: PORTABLE LEFT KNEE - 1-2 VIEW  COMPARISON:  None.  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 The medial cortex of the medial tibial plateau demonstrates loss of definition. This is concerning for the possibility of osteomyelitis. The cortex along the medial margin of the distal medial femoral condyles is also indistinct. There is medial soft tissue swelling at the joint  line.  IMPRESSION: The findings are concerning for cellulitis with underlying osteomyelitis medially.   Electronically Signed   By: Skipper Cliche M.D.   On: 03/20/2014 17:51   Dg Knee Right Port  03/20/2014   CLINICAL DATA:  Bilateral knee pain  EXAM: PORTABLE RIGHT KNEE - 1-2 VIEW  COMPARISON:  None.  FINDINGS: Severe tricompartmental degenerative change is noted. Small suprapatellar effusion. No fracture or dislocation. No radiopaque foreign body.  IMPRESSION: Severe tricompartmental degenerative change.   Electronically Signed   By: Conchita Paris M.D.   On: 03/20/2014 17:51   Dg Abd Portable 1v  03/25/2014   CLINICAL DATA:  Feeding tube placement.  EXAM: PORTABLE ABDOMEN - 1 VIEW  COMPARISON:  None.  FINDINGS: Distal tip of feeding tube is seen in the expected position of the proximal stomach. Visualized bowel appears normal.  IMPRESSION: Distal tip of feeding tube is seen in expected position of the proximal stomach.   Electronically Signed   By: Sabino Dick M.D.   On: 03/25/2014 09:56   Dg Foot Complete Left  03/20/2014   CLINICAL DATA:  Left heel wound  EXAM: LEFT FOOT - COMPLETE 3+ VIEW  COMPARISON:  None.  FINDINGS: There is extensive artifact from material overlying the foot which significantly decreases sensitivity and specificity for detection of osseous or soft tissue abnormalities. No grossly displaced fracture is identified. Bones are subjectively osteopenic. Dorsal soft tissue swelling is evident with plantar calcaneal spurring and vascular calcifications noted.  IMPRESSION: Suboptimal visualization due to support apparatus, but with nonspecific dorsal soft tissue swelling identified.   Electronically Signed   By: Conchita Paris M.D.   On: 03/20/2014 17:07   Dg Foot Complete Right  03/20/2014   CLINICAL DATA:  Bilateral foot pain, open skin wound  EXAM: RIGHT FOOT COMPLETE - 3+ VIEW  COMPARISON:  None.  FINDINGS: Bones are subjectively osteopenic. Positioning is suboptimal due to  patient immobility and portable technique. Degenerative changes are noted at the midfoot. No fracture or  dislocation allowing for positioning. Plantar calcaneal spurring noted. There is soft tissue swelling involving the dorsum of the foot and adjacent to the lateral malleolus. No radiopaque foreign body.  IMPRESSION: Subjective osteopenia without acute osseous abnormality.  Dorsal soft tissue swelling and swelling over the lateral malleolus. Correlate for cellulitis or skin abnormalities in this area.   Electronically Signed   By: Conchita Paris M.D.   On: 03/20/2014 17:51   Dg Fluoro Guide Ndl Plc/bx  03/26/2014   CLINICAL DATA:  58 year old female with recent hospitalization with MSSA bacteremia of unknown source and possible rheumatoid arthritis. She is currently admitted with sepsis of unclear source requiring pressors. Possible osteomyelitis of the medial left knee on x-ray. Bilateral knee joint aspirations are requested to evaluate for infectious source  EXAM: FLUOROSCOPICALLY GUIDED BILATERAL KNEE JOINT ASPIRATIONS.  FLUOROSCOPY TIME:  17 seconds  PROCEDURE: After a thorough discussion of risks and benefits of the procedure including the general risks of bleeding, infection, injury to nerves, blood vessels, adjacent structures, and allergic reaction, verbal and written consent was obtained. Verbal consent was obtained by Dr. Laurence Ferrari. Specific risks of this procedure included false positive and false negative results and introduction of infection into the joint. Time-out form was completed when appropriate.  Attention was first turned to the left knee.  The joint was localized under fluoroscopy. A skin entry site medial to the joint was chosen and marked. The skin was prepped and draped in the usual sterile fashion with Betadine soap. Local anesthesia and deep anesthesia was provided with 1% lidocaine without epinephrine. Careful attention was paid not to inject bacteriostatic lidocaine into the  joint. Under fluoroscopic guidance an 18 gauge spinal needle was advanced into the joint. Location within the suprapatellar recess was confirmed by a gentle hand injection of 3 mL Omnipaque 300. Vigorous aspiration was performed which yielded approximately 5 cc of bloody synovial fluid.  The needle was removed and a sterile dressing applied.  Attention was next turned to the right knee.  The joint was localized under fluoroscopy. A skin entry site lateral to the joint was chosen and marked. The skin was prepped and draped in the usual sterile fashion with Betadine soap. Local anesthesia and deep anesthesia was provided with 1% lidocaine without epinephrine. Careful attention was paid not to inject bacteriostatic lidocaine into the joint. Under fluoroscopic guidance an 18 gauge spinal needle was advanced into the joint. Location within the suprapatellar recess was confirmed by a gentle hand injection of 3 mL Omnipaque 300. Vigorous aspiration was performed which yielded approximately 5 cc of bloody synovial fluid.  Both synovial fluid samples were sent for Gram stain and culture.  IMPRESSION: Technically successful fluoroscopic guided aspiration of the bilateral knee joints.   Electronically Signed   By: Jacqulynn Cadet M.D.   On: 03/26/2014 08:39   Dg Fluoro Guide Ndl Plc/bx  03/26/2014   CLINICAL DATA:  58 year old female with recent hospitalization with MSSA bacteremia of unknown source and possible rheumatoid arthritis. She is currently admitted with sepsis of unclear source requiring pressors. Possible osteomyelitis of the medial left knee on x-ray. Bilateral knee joint aspirations are requested to evaluate for infectious source  EXAM: FLUOROSCOPICALLY GUIDED BILATERAL KNEE JOINT ASPIRATIONS.  FLUOROSCOPY TIME:  17 seconds  PROCEDURE: After a thorough discussion of risks and benefits of the procedure including the general risks of bleeding, infection, injury to nerves, blood vessels, adjacent structures,  and allergic reaction, verbal and written consent was obtained. Verbal consent was obtained by  Dr. Laurence Ferrari. Specific risks of this procedure included false positive and false negative results and introduction of infection into the joint. Time-out form was completed when appropriate.  Attention was first turned to the left knee.  The joint was localized under fluoroscopy. A skin entry site medial to the joint was chosen and marked. The skin was prepped and draped in the usual sterile fashion with Betadine soap. Local anesthesia and deep anesthesia was provided with 1% lidocaine without epinephrine. Careful attention was paid not to inject bacteriostatic lidocaine into the joint. Under fluoroscopic guidance an 18 gauge spinal needle was advanced into the joint. Location within the suprapatellar recess was confirmed by a gentle hand injection of 3 mL Omnipaque 300. Vigorous aspiration was performed which yielded approximately 5 cc of bloody synovial fluid.  The needle was removed and a sterile dressing applied.  Attention was next turned to the right knee.  The joint was localized under fluoroscopy. A skin entry site lateral to the joint was chosen and marked. The skin was prepped and draped in the usual sterile fashion with Betadine soap. Local anesthesia and deep anesthesia was provided with 1% lidocaine without epinephrine. Careful attention was paid not to inject bacteriostatic lidocaine into the joint. Under fluoroscopic guidance an 18 gauge spinal needle was advanced into the joint. Location within the suprapatellar recess was confirmed by a gentle hand injection of 3 mL Omnipaque 300. Vigorous aspiration was performed which yielded approximately 5 cc of bloody synovial fluid.  Both synovial fluid samples were sent for Gram stain and culture.  IMPRESSION: Technically successful fluoroscopic guided aspiration of the bilateral knee joints.   Electronically Signed   By: Jacqulynn Cadet M.D.   On: 03/26/2014  08:39      Microbiology: No results found for this or any previous visit (from the past 240 hour(s)).   Labs: Results for orders placed or performed during the hospital encounter of 03/19/14 (from the past 48 hour(s))  Glucose, capillary     Status: Abnormal   Collection Time: 04/04/14  5:47 PM  Result Value Ref Range   Glucose-Capillary 210 (H) 70 - 99 mg/dL   Comment 1 Notify RN   Glucose, capillary     Status: Abnormal   Collection Time: 04/04/14 10:29 PM  Result Value Ref Range   Glucose-Capillary 161 (H) 70 - 99 mg/dL   Comment 1 Documented in Chart    Comment 2 Notify RN   Glucose, capillary     Status: Abnormal   Collection Time: 04/05/14  8:06 AM  Result Value Ref Range   Glucose-Capillary 110 (H) 70 - 99 mg/dL  Glucose, capillary     Status: Abnormal   Collection Time: 04/05/14 11:57 AM  Result Value Ref Range   Glucose-Capillary 114 (H) 70 - 99 mg/dL  Glucose, capillary     Status: Abnormal   Collection Time: 04/05/14  4:36 PM  Result Value Ref Range   Glucose-Capillary 163 (H) 70 - 99 mg/dL  Glucose, capillary     Status: Abnormal   Collection Time: 04/05/14  9:12 PM  Result Value Ref Range   Glucose-Capillary 157 (H) 70 - 99 mg/dL  Glucose, capillary     Status: Abnormal   Collection Time: 04/06/14  7:44 AM  Result Value Ref Range   Glucose-Capillary 126 (H) 70 - 99 mg/dL     HPI/Brief narrative 58 year old female patient with history of hypertension, obesity, DM, chronic diastolic CHF, possible rheumatoid arthritis, recent hospitalization for MSSA bacteremia of  unknown source/neg TEE- cleared after Vancomycin (PCN allergy), went to SNF and then discharged from their to home, presented with complaints of not eating or drinking 4 days and husband was concerned that patient was trying to hurt herself and she was IVC and brought to ED where she was assessed as sepsis of unclear source and admitted to stepdown for further management. Patient went into septic shock  requiring pressors. She also developed STEMI but poor overall candidate for aggressive intervention i.e. Currently. PCCM, ID, hematology oncology, palliative care and cardiology assisting with care. Shock resolved- pressors DC'ed 12/7 AM.  Ongoing medical issues as follows, patient not improving, deemed to be hospice eligible   Assessment/Plan:  Goals of care Continued fentanyl patch for pain Continue lorazepam for anxiety Continue Provigil , Continue Afrin nasal spray for persistent nosebleeds DO NOT RESUSCITATE No further hospitalization, goal is comfort  Other acute issues treated during this hospitalization are as below  Septic shock secondary to MSSA bacteremia: Continue cefazolin. Source not clear-? Status post arthrocentesis. Cell count from B/L knee synovial fluid consistent with inflammatory arthritis, culture shows MSSA from the left knee. Infectious disease follow-up appreciated. . Shock improved/resolved and DC'd pressors 12/7 AM. She has been off IV hydrocortisone . Currently on prednisone, Fludrocortisone. As per ID, will eventually need repeat TEE but has not been medically stable enough to do so. Has been unable to obtain MRI of left knee,foot ,ankle , any attempt to do this without conscious sedation has failed. Not stable for conscious sedation,. Surveillance blood cultures 2 (12/4): Negative to date. PICC line placed after ID Approval. Patient found to have MSSA septic arthritis of left knee. Antibiotics outlined by infectious disease. Infectious disease has signed off on 12/16. Patient was receiving cefazolin 2 g IV every 8 hours   using 12/9 as D1 .of 42 . Infectious disease switched patient to daptomycin because of pancytopenia. Discussed with Dr. Hilma Favors, palliative care be have decided to stop all antibiotics at this time   Septic arthritis of the left knee. Has not been able to do MRI. Patient severely deconditioned. Therefore did not consult orthopedics for a  washout  Suspected Left lower lobe airspace disease vs collapse of the left lower lobe and atelectasis of the right lower lobe, no definite pneumonia on CT scan done on 12/17. Patient currently on room air  hyponatremia: Resolved.   Inferiolateral wall STEMI: Patient has not complained of chest pain since admission. Troponins steadily increased to >20 on 12/3. Not candidate for aggressive intervention i.e. cath secondary to unstable status from shock, anemia and thrombocytopenia. Patient treated with 48 hours of IV heparin-DC'd 12/7 and was placed on aspirinbut discontinued due to vaginal bleeding . Marland Kitchen 2-D echo shows EF of 50-55% the distal inferior and apical hypokinesis that is new.. . seen by Dr. Debara Pickett 12/17. Patient is a high risk for any interventional procedure and at high-risk of recurrent MI. Deemed not to be a candidate for cardiac catheterization. Unfortunately her body when tolerating medical therapy.   IVC/?? Intention to self harm: IVC removed . Psychiatry consultation note reviewed . Discussed with Dr. Harrington Challenger 12/5. Patient has capacity to make her own medical decisions and living arrangement. Meds adjusted by psyche which the patient is refusing to take . No evidence of imminent risk to self. Sitter discontinued. Patient does have capacity for psych evaluation.   Anasarca: Secondary to ongoing illnesses, malnutrition and hypo-albuminemia.worsening , Treat underlying cause and attempt to improve nutritional status. Tube feeding held because  of nausea. Intolerant to tube feeding. Continue TPN for now. Decision needs to be made about further TPN should be continued on not. Dr. Rhea Pink from palliative care has been helping [place] determine if the patient is actually terminally are not despite no diagnosis of a terminal condition. She is definitely not a candidate for PEG tube placement.   Right sacral decubitus multiple areas of skin breakdown secondary to anasarca and abrasions related  to her thrombocytopenia: Does not appear grossly infected. Wound care consultation completed during this hospitalization    Hypertension: patient was inic shock. has been stable since 12/7    Type II DM: Uncontrolled. Likely worsened by steroids. Discontinue insulin, normal Accu-Cheks    New diagnosis of rheumatoid arthritis: Patient was on prednisone at home. Patient placed on 20 mg of prednisone. Unable to tolerate by mouth medicines therefore changed to IV Solu-Medrol minimum dose of 40 mg. RA related pain was probably the reason she had been bed bound since recent DC from SNF. Patient will have OP Rheumatology consultation .  Chronic diastolic CHF: Generalized anasarca but probably intravascularly dehydrated due to third spacing. developed acute renal failure with IV Lasix   Hypokalemia: Replaced.   Anemia: Acute on chronic. Vaginal bleeding , splenomegaly on CT scan. , Baseline hemoglobin probably in the 9 g per DL range. Status post 1 unit PRBC 12/4 with appropriate response. Hemoglobin trending down, now with vaginal bleeding , type and screen,we had to discontinue asa and lovenox, Patient has received a total of 4 units this admission. Hematology following... Recommend platelet transfusion if the patient is actively bleeding or platelets less than 20 K. INR improving with vitamin K injections. Patient's transfusion to trigger is less than 8.0   Thrombocytopenia: Possibly from acute illness/sepsis. 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 No suspicion for myelodysplasia. No indication for bone marrow biopsy. May have some underlying platelet dysfunction contribution to the bleeding. If profoundly bleeding please transfuse platelets.   Multiple pressure wounds: Details as per [place] nurse note on 12/4. Patient has left heel DTI, right arm full-thickness abrasion and unstageable right  buttock wound. Reconsult wound care because of bleeding sacral wounds.   Failure to thrive: Multifactorial   Intractable nausea. Patient started on Protonix IV, discontinued prednisone by mouth, unable to tolerate tube feeding secondary to intractable nausea and vomiting despite IV Reglan. Concern about esophageal etiology, but will not be able to tolerate EGD. CT scan of abdomen chest and pelvis done yesterday with IV contrast. does not show any definite of malignancy. Shows splenomegaly. Hilma Favors to explain to the patient's family further about what the plan of care is going to be   Severe deconditioning: Severe protein calorie malnutrition: Dietitian consulted. Did not tolerate enteral feeding , now on TPN. Discussed PEG tube placement with the husband. patient is not a candidate for PEG tube placement given multiple comorbidities.   depressive disorder secondary to general medical condition: Management per psychiatry. zyprexa added. I doubt that psychiatry has anything further to offer   oliguria renal failure-DC with minimal response to albumin IV fluids, as per nephrology. Patient could have immune complex mediated acute renal failure. Nephrology has signed off : Likely secondary to third spacing of fluids in the setting of severe protein calorie malnutrition and intravascular dehydration. . Creatinine stable for 3 days   DVT: unable to tolerate locenox, scd's ,asa secondary to bleeding   Code Status: DO NOT RESUSCITATE Family Communication: Husband present by the bedside   Disposition  Plan: Angelita continues to decline. She is more confused today-signs of delirium. She started talking about giving her things away when I gently awoke her from sleep. RN reports continued profuse bleeding- vaginal, nasal bleeding, gums/mouth and her wounds are bleeding and weeping. Skin with evidence of diffuse bruising. She reports that her pain is better but when I gently place my hand on her skin  she guards. She continues to not take anything by mouth and refuses any and all PO medications.      Discharge Exam:     Blood pressure 138/77, pulse 93, temperature 97.8 F (36.6 C), temperature source Oral, resp. rate 20, height 5' 4" (1.626 m), weight 96.571 kg (212 lb 14.4 oz), SpO2 99 %.  Patient refused to be examined       Discharge Instructions    Diet - low sodium heart healthy    Complete by:  As directed      Increase activity slowly    Complete by:  As directed              Signed: Kamareon Sciandra 04/06/2014, 10:47 AM

## 2014-04-06 NOTE — Progress Notes (Signed)
Palliative Care Team at [facility] Progress Note   SUBJECTIVE: Mostly unresponsive this AM. Poor PO intake.  OBJECTIVE: Vital Signs: BP 138/77 mmHg  Pulse 93  Temp(Src) 97.8 F (36.6 C) (Oral)  Resp 20  Ht 5\' 4"  (1.626 m)  Wt 96.571 kg (212 lb 14.4 oz)  BMI 36.53 kg/m2  SpO2 99%   Intake and Output: 12/20 0701 - 12/21 0700 In: 1180 [I.V.:970; IV Piggyback:210] Out: 1050 [Urine:1050]  Physical Exam: General: Pale, chronically ill appearing  Head: normal  Lungs:  Shallow   Heart: tachyxcardic  Abdomen:  soft  Extremities: Multiple wounds and lesions    Allergies  Allergen Reactions  . Penicillins Rash    Medications: Scheduled Meds:  . antiseptic oral rinse  7 mL Mouth Rinse q12n4p  . chlorhexidine  15 mL Mouth Rinse BID  . DAPTOmycin (CUBICIN)  IV  500 mg Intravenous Q24H  . diazepam  5 mg Intravenous QHS  . feeding supplement (ENSURE COMPLETE)  237 mL Oral TID BM  . fentaNYL  12.5 mcg Transdermal Q72H  . fluconazole (DIFLUCAN) IV  200 mg Intravenous Q24H  . hydrocortisone cream   Topical BID  . insulin glargine  10 Units Subcutaneous Daily  . magic mouthwash w/lidocaine  10 mL Oral TID  . methylPREDNISolone (SOLU-MEDROL) injection  20 mg Intravenous Daily  . metoprolol tartrate  12.5 mg Oral q morning - 10a  . mirtazapine  15 mg Oral QHS  . modafinil  100 mg Oral Daily  . nystatin  5 mL Oral QID  . ondansetron (ZOFRAN) IV  4 mg Intravenous 4 times per day  . oxymetazoline  1 spray Each Nare BID  . pantoprazole (PROTONIX) IV  40 mg Intravenous Q24H  . sodium chloride  10-40 mL Intracatheter Q12H    Continuous Infusions: . sodium chloride 40 mL/hr at 04/05/14 2347    PRN Meds: acetaminophen **OR** acetaminophen, diazepam, heparin lock flush, HYDROmorphone (DILAUDID) injection, [DISCONTINUED] ondansetron **OR** ondansetron (ZOFRAN) IV, sodium chloride  Stool Softner: yes  Palliative Performance Scale: 20  %   Labs: CBC    Component Value  Date/Time   WBC 2.9* 04/03/2014 0500   WBC 5.1 05/09/2013 1233   RBC 2.78* 04/03/2014 0500   RBC 2.42* 03/28/2014 0825   RBC 4.84 05/09/2013 1233   HGB 8.1* 04/03/2014 0500   HGB 14.4 05/09/2013 1233   HCT 25.5* 04/03/2014 0500   HCT 47.4 05/09/2013 1233   PLT 46* 04/03/2014 0500   MCV 91.7 04/03/2014 0500   MCV 97.9* 05/09/2013 1233   MCH 29.1 04/03/2014 0500   MCH 29.8 05/09/2013 1233   MCHC 31.8 04/03/2014 0500   MCHC 30.4* 05/09/2013 1233   RDW 18.5* 04/03/2014 0500   LYMPHSABS 0.3* 03/30/2014 0400   MONOABS 0.2 03/30/2014 0400   EOSABS 0.0 03/30/2014 0400   BASOSABS 0.0 03/30/2014 0400    CMET     Component Value Date/Time   NA 145 04/03/2014 0500   K 4.2 04/03/2014 0500   CL 116* 04/03/2014 0500   CO2 17* 04/03/2014 0500   GLUCOSE 263* 04/03/2014 0500   BUN 88* 04/03/2014 0500   CREATININE 1.31* 04/03/2014 0500   CREATININE 0.44* 02/12/2014 1232   CALCIUM 8.0* 04/03/2014 0500   PROT 4.8* 04/02/2014 0358   ALBUMIN 1.8* 04/02/2014 0358   AST 33 04/02/2014 0358   ALT <5 04/02/2014 0358   ALKPHOS 295* 04/02/2014 0358   BILITOT 0.6 04/02/2014 0358   GFRNONAA 43* 04/03/2014 0500   GFRNONAA >  89 02/12/2014 1232   GFRAA 50* 04/03/2014 0500   GFRAA >89 02/12/2014 1232     ASSESSMENT/ PLAN: Actively dying- suspect worsening of blood loss anemia.  [facility] today. Discontinue antibiotics. Pain control and sedation are primary comfort goals.  Time: 8AM-815AM Greater than 50%  of this time was spent counseling and coordinating care related to the above assessment and plan.   Edsel Petrin, DO  04/06/2014, 12:40 PM  Please contact Palliative Medicine Team phone at [phone] for questions and concerns.

## 2014-04-06 NOTE — Progress Notes (Signed)
Patient is set to discharge to [facility] today. Patient & husband at bedside aware. Discharge packet given to RN, Florentina Addison. [facility] called for transport.     Lincoln Maxin, LCSW [facility] Clinical Social Worker cell #: [phone]

## 2014-04-06 NOTE — Progress Notes (Signed)
Report given to Joy RN at [facility].  Went over all medications, times and what has been given today.  Pt PICC line deaccessed by IV nurse.  VSS.  [employer] packet given to [employer].  Pt wheeled out by EMS.

## 2014-04-17 DEATH — deceased

## 2014-06-09 DIAGNOSIS — Z0271 Encounter for disability determination: Secondary | ICD-10-CM

## 2016-04-26 IMAGING — DX DG CHEST 1V PORT
1 series · 1 of 1 positions shown · non-contrast
Comparison: 01/27/2014

CLINICAL DATA: Failure to thrive

EXAM:
PORTABLE CHEST - 1 VIEW

[AP]
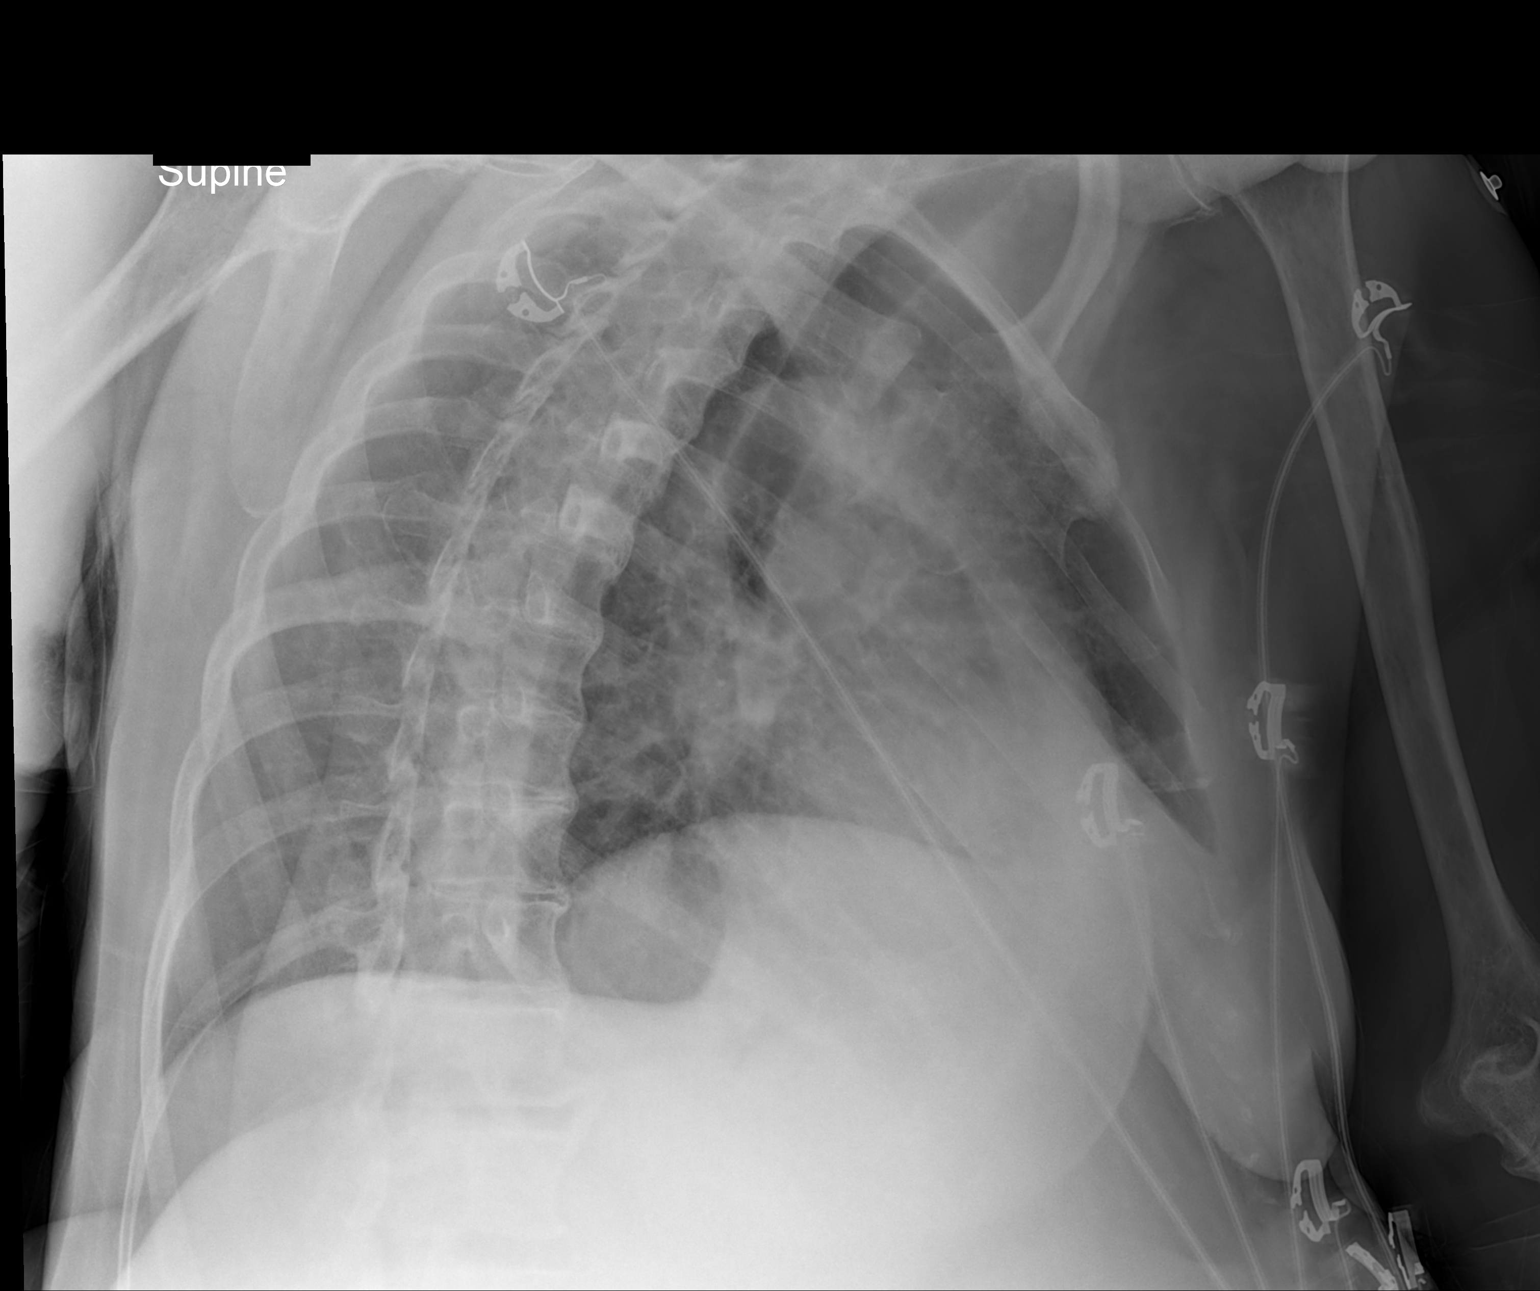

[1 of 1 positions shown; findings below may reference images not displayed]

FINDINGS: Patient is rotated considerably to the left and the study was not
repeated. Study is significantly limited. Lungs are grossly clear.
IMPRESSION: Recommend repeat study. The patient is rotated considerably. Lungs
are grossly clear.

## 2016-04-27 IMAGING — DX DG KNEE 1-2V PORT*L*
2 series · 2 of 2 positions shown · non-contrast
Comparison: None.

CLINICAL DATA: Bilateral foot can knee pain, initial evaluation,
multiple open wounds due to bacterial infection, bilateral knee
effusions

EXAM:
PORTABLE LEFT KNEE - 1-2 VIEW

[knee ap]
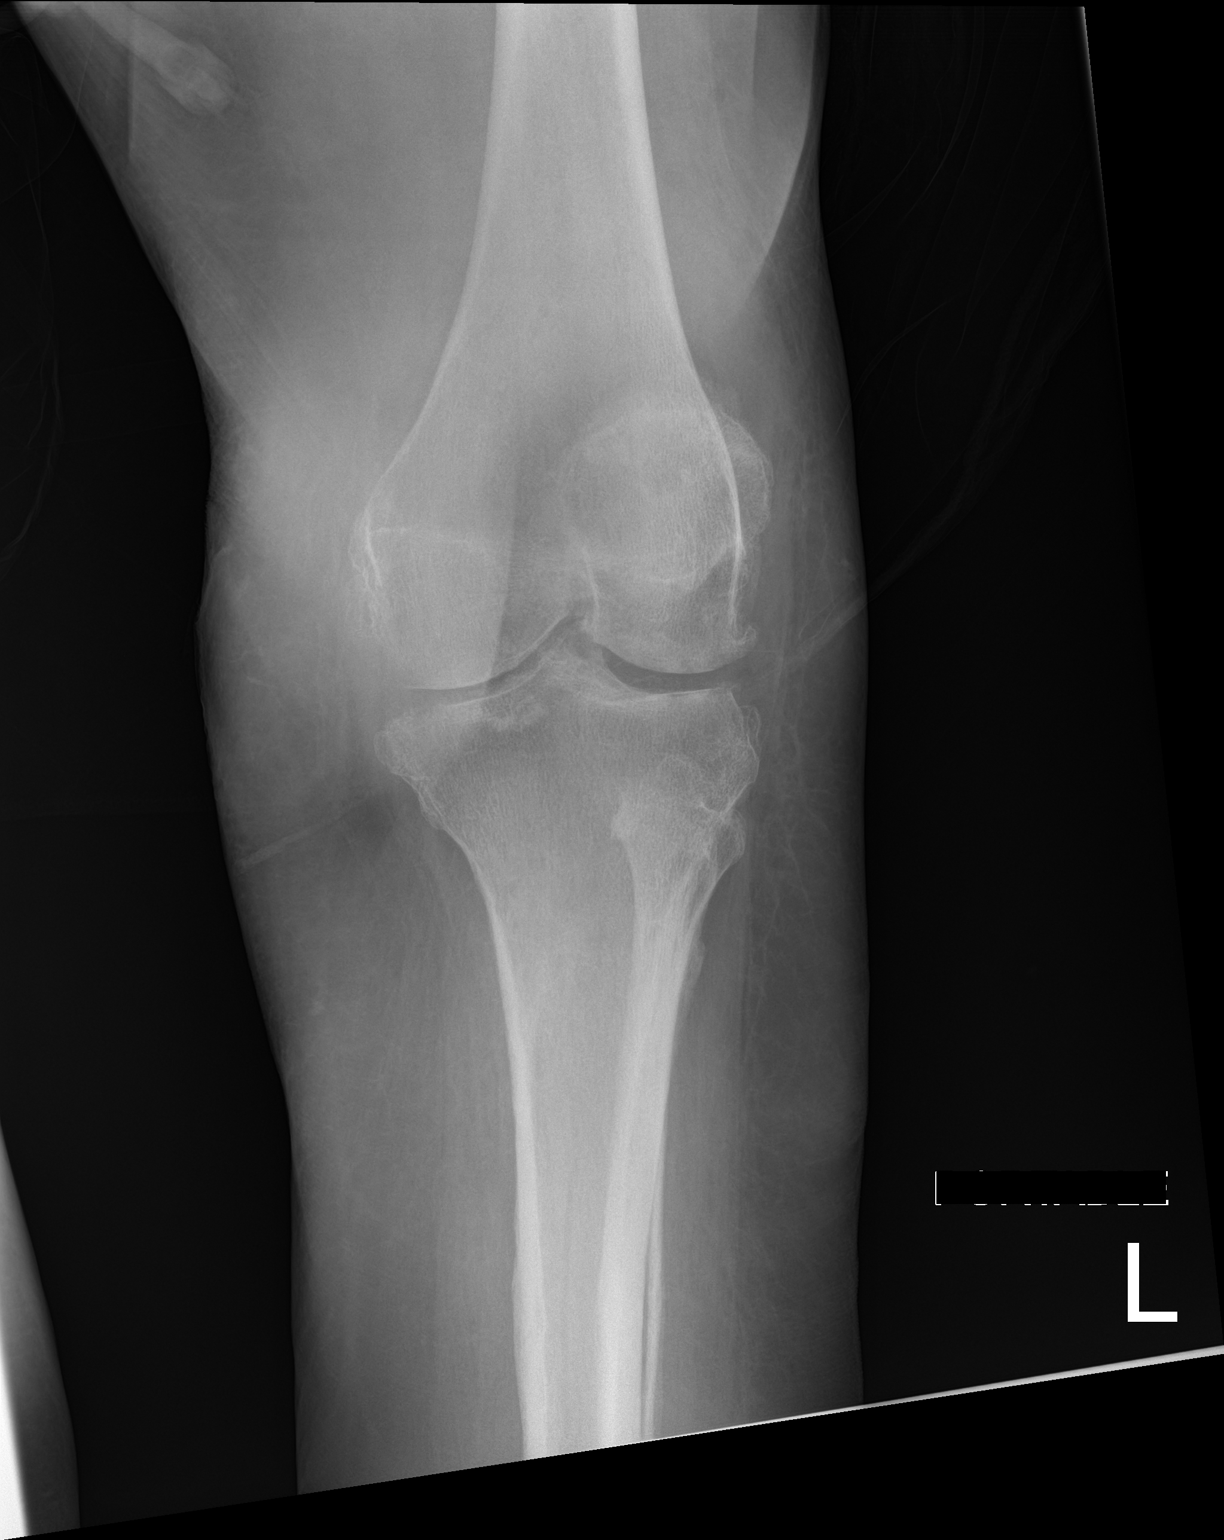

[knee lat]
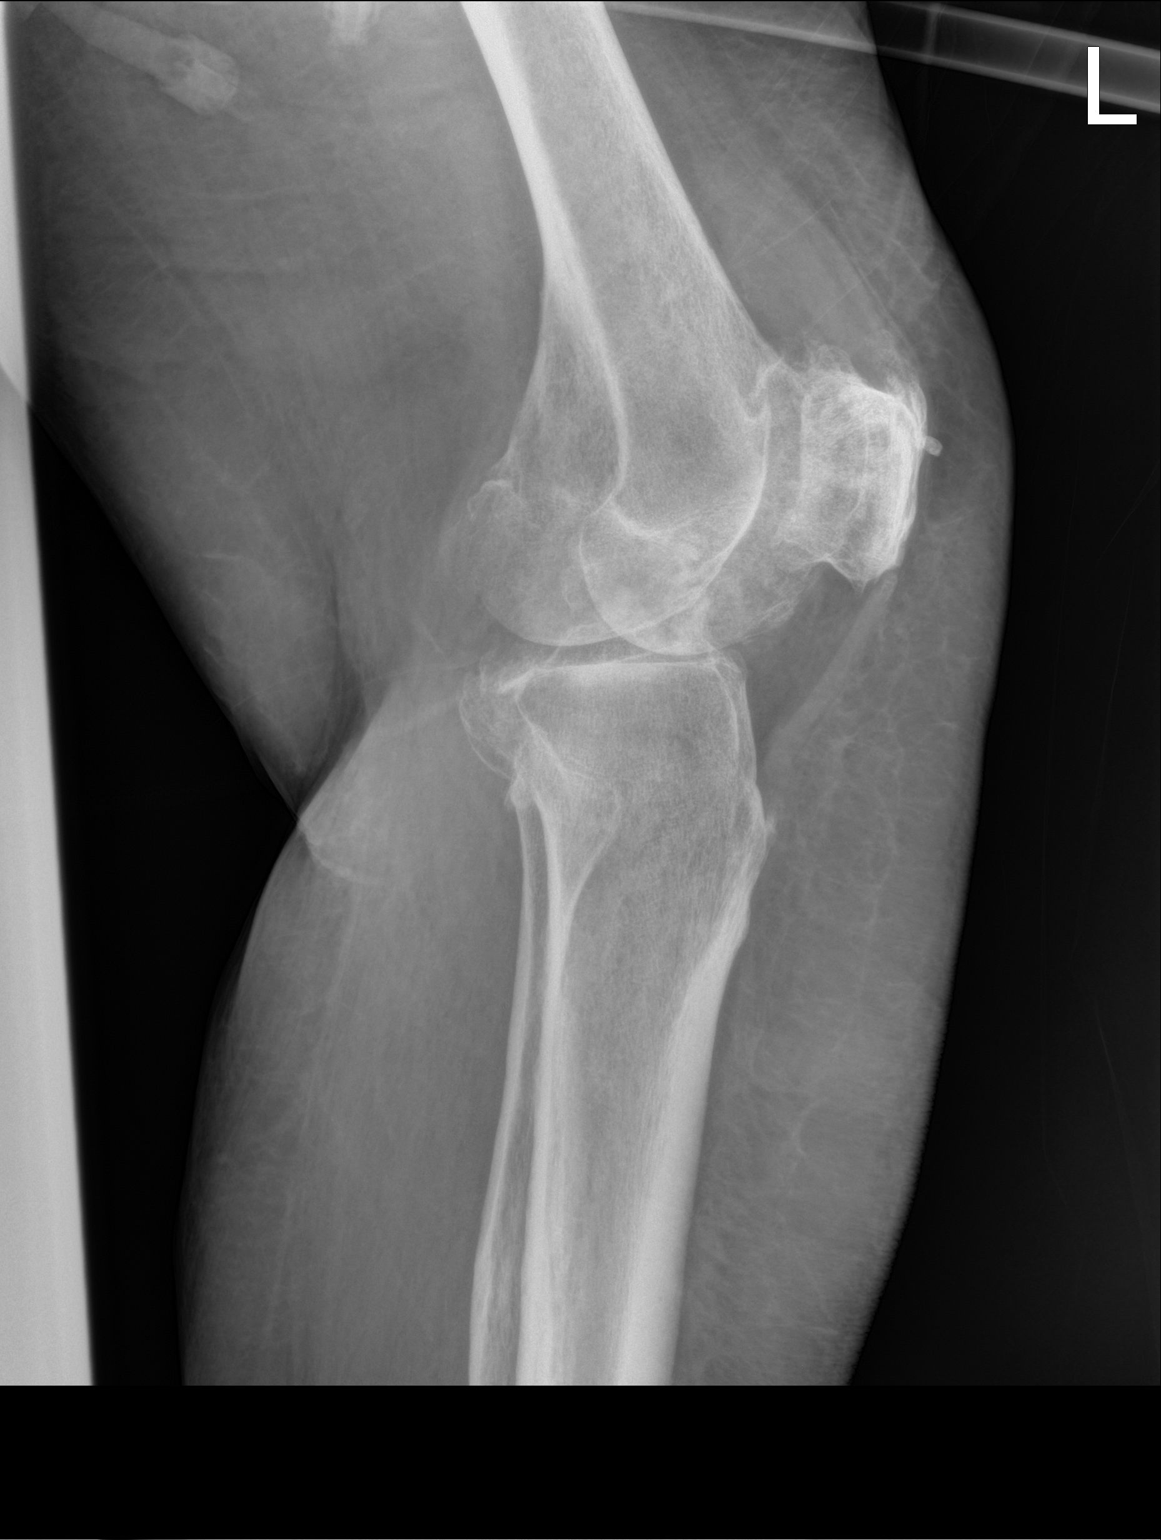

[2 of 2 positions shown; findings below may reference images not displayed]

FINDINGS: There is moderate tricompartmental arthritis of the left knee. The
medial cortex of the medial tibial plateau demonstrates loss of
definition. This is concerning for the possibility of osteomyelitis.
The cortex along the medial margin of the distal medial femoral
condyles is also indistinct. There is medial soft tissue swelling at
the joint line.
IMPRESSION: The findings are concerning for cellulitis with underlying
osteomyelitis medially.

## 2016-04-27 IMAGING — DX DG FOOT COMPLETE 3+V*L*
3 series · 5 of 5 positions shown · non-contrast
Comparison: None.

CLINICAL DATA: Left heel wound

EXAM:
LEFT FOOT - COMPLETE 3+ VIEW

[foot lat]
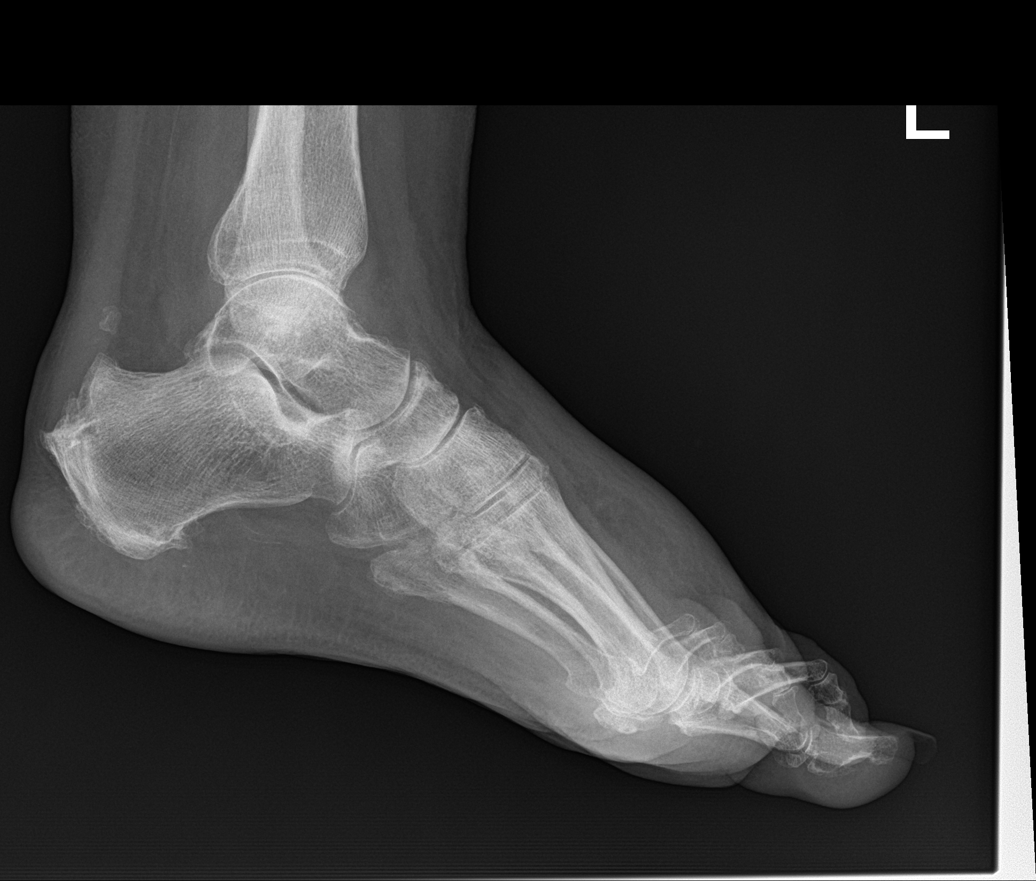

[Series 4: foot ap · 0.14mm/px · 2 of 2 slices shown]
[im 1/2]
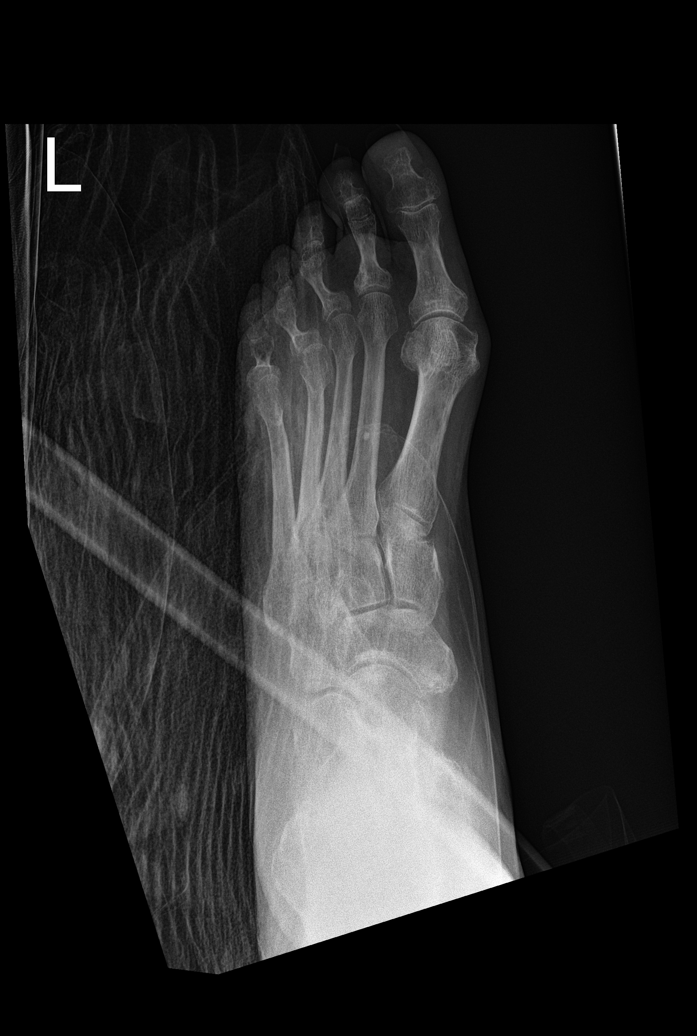
[im 2/2]
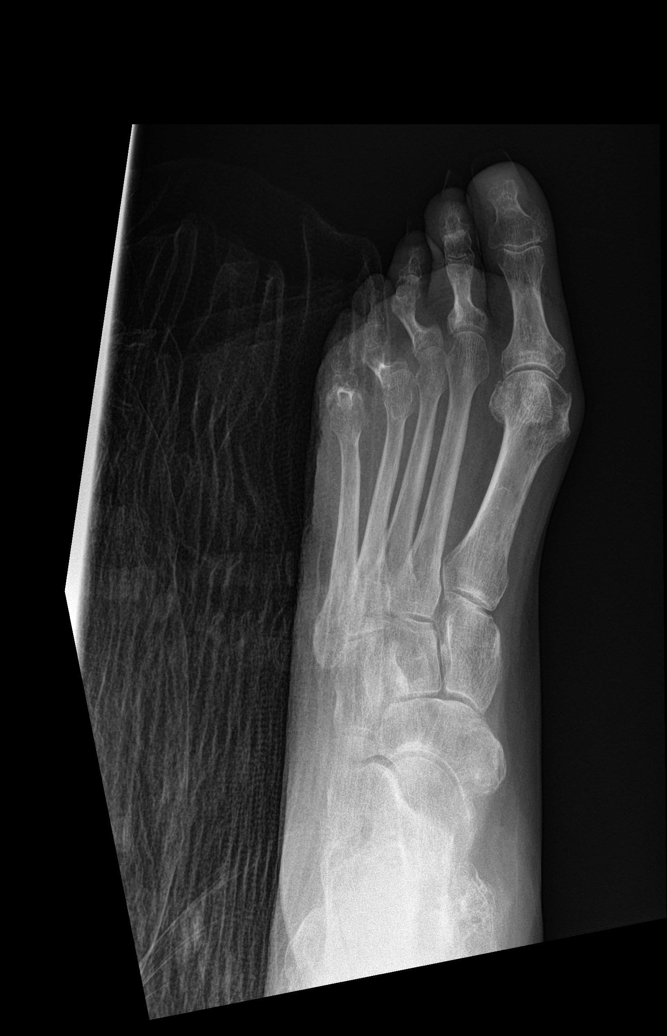

[Series 5: foot obl · 0.14mm/px · 2 of 2 slices shown]
[im 1/2]
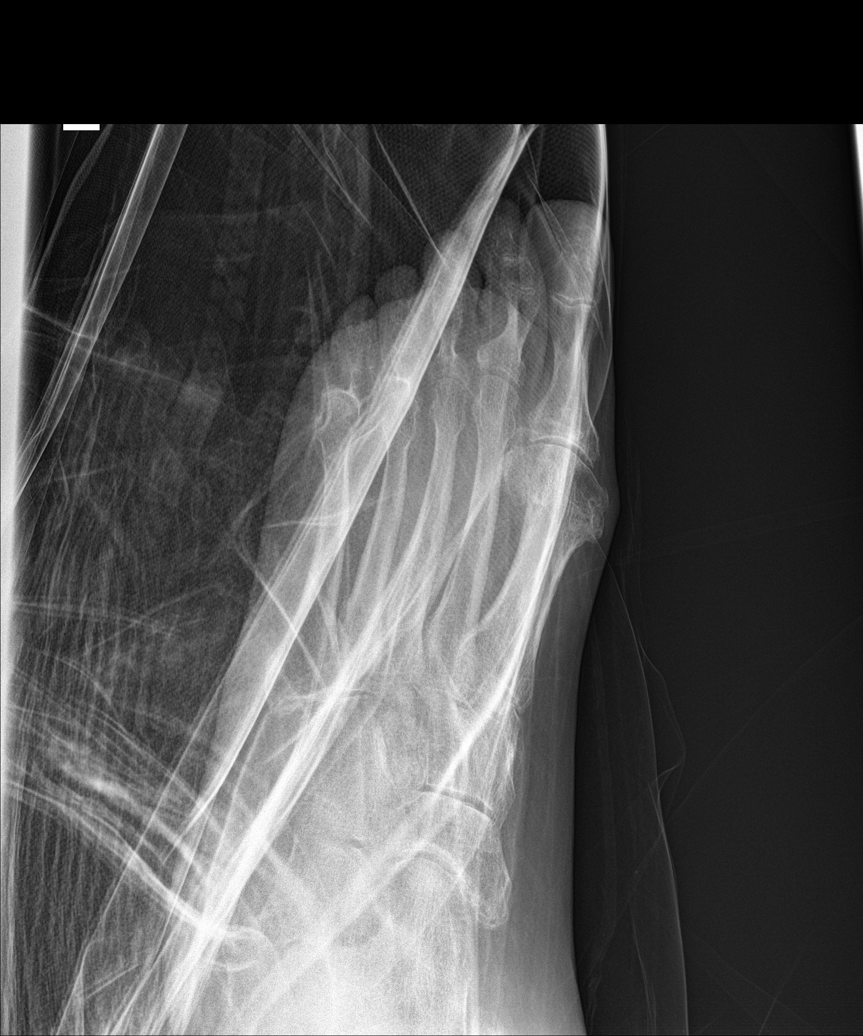
[im 2/2]
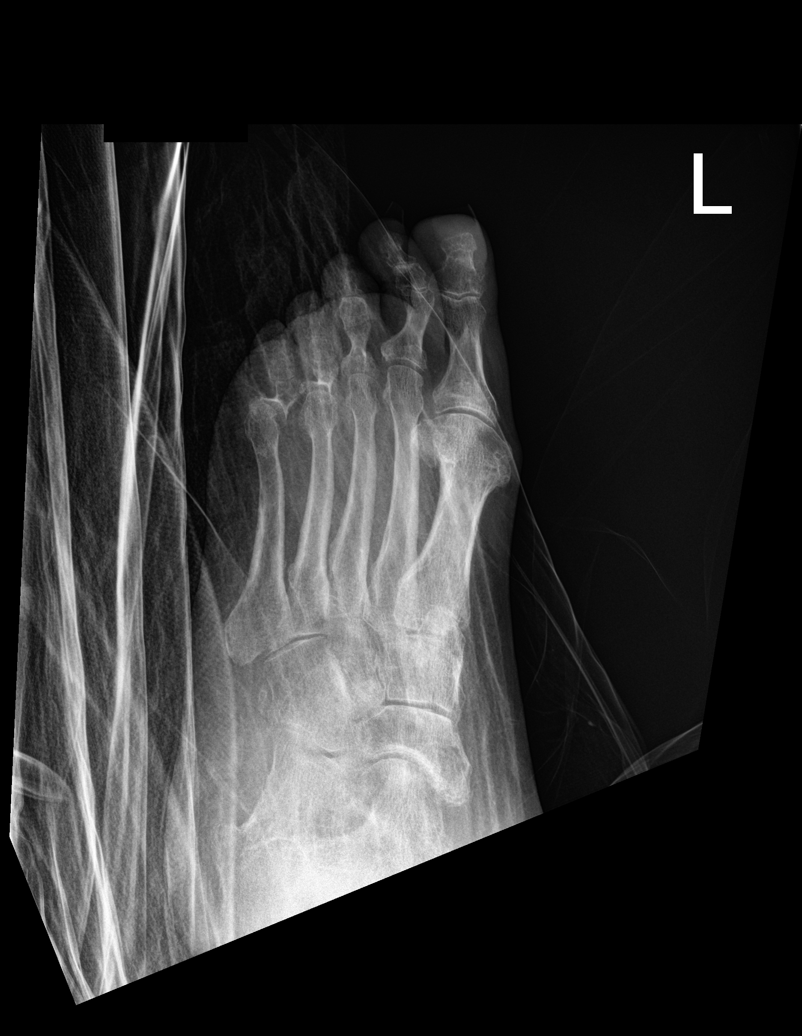

[5 of 5 positions shown; findings below may reference images not displayed]

FINDINGS: There is extensive artifact from material overlying the foot which
significantly decreases sensitivity and specificity for detection of
osseous or soft tissue abnormalities. No grossly displaced fracture
is identified. Bones are subjectively osteopenic. Dorsal soft tissue
swelling is evident with plantar calcaneal spurring and vascular
calcifications noted.
IMPRESSION: Suboptimal visualization due to support apparatus, but with
nonspecific dorsal soft tissue swelling identified.

## 2016-04-27 IMAGING — DX DG CHEST 1V PORT
1 series · 1 of 1 positions shown · non-contrast
Comparison: March 19, 2014 study obtained earlier in the day ;
January 27, 2014

CLINICAL DATA: Fever and sepsis

EXAM:
PORTABLE CHEST - 1 VIEW

[chest ap]
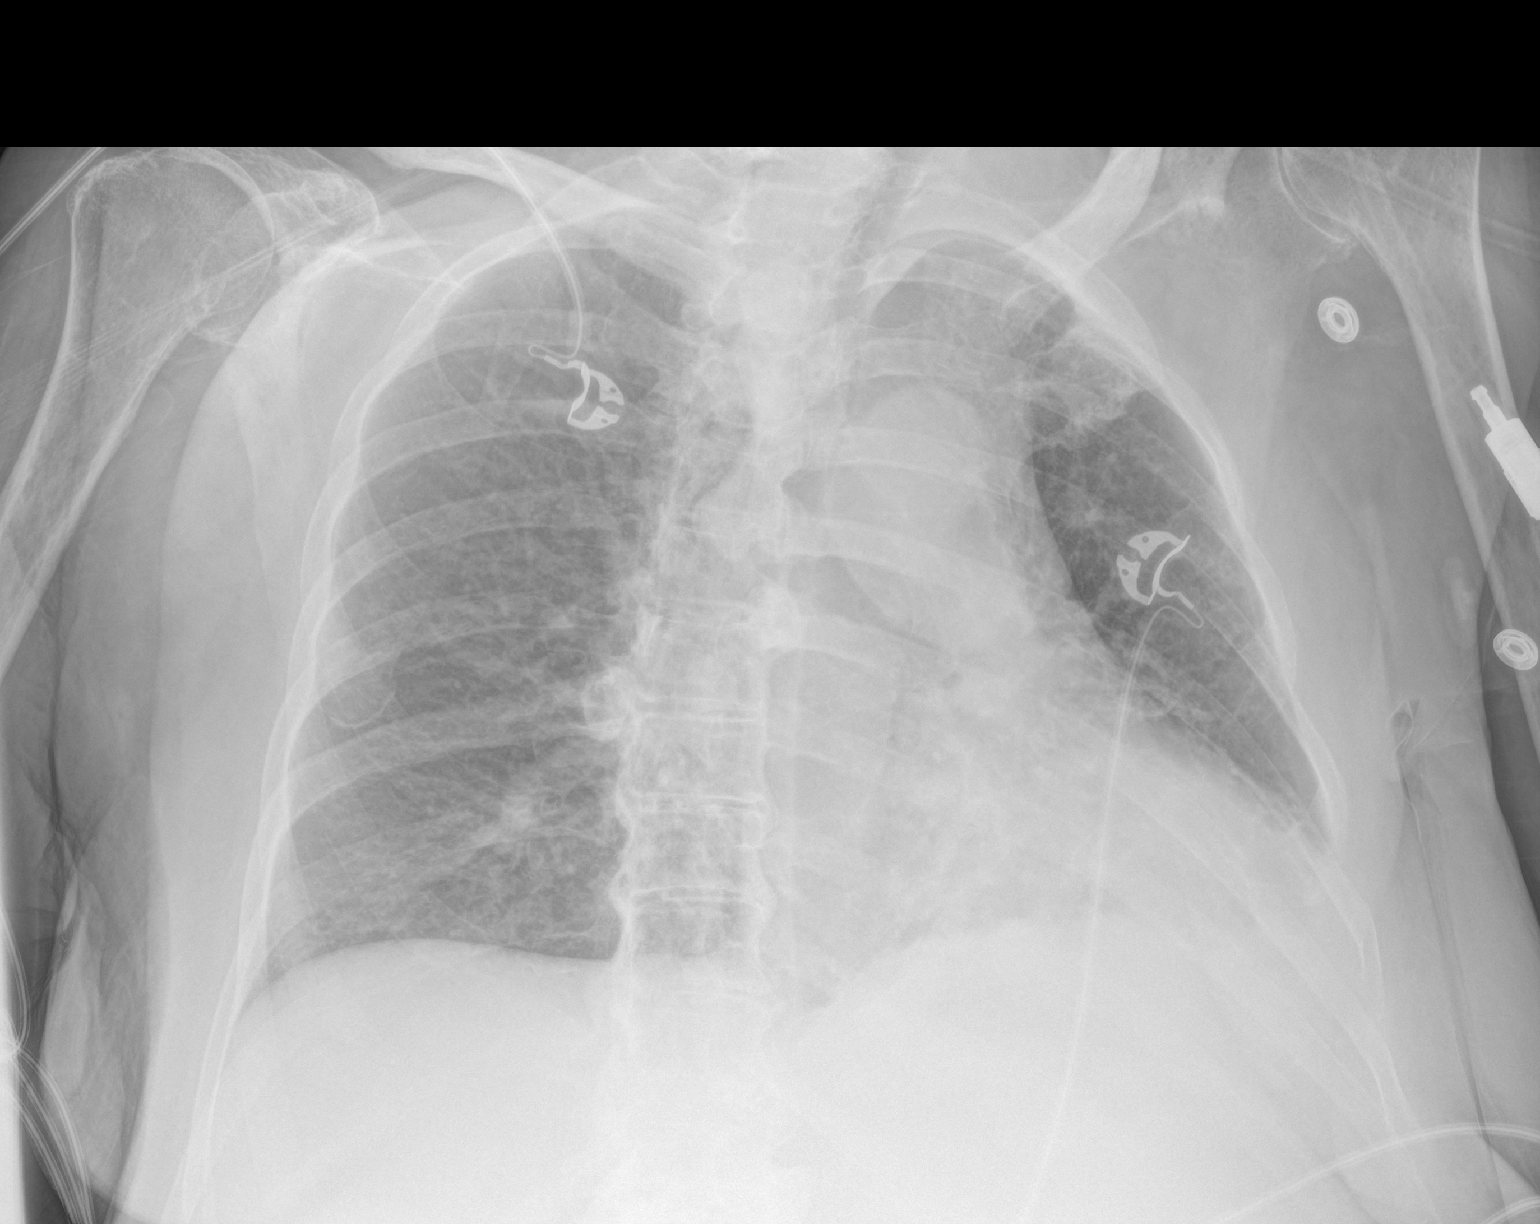

[1 of 1 positions shown; findings below may reference images not displayed]

FINDINGS: There is mild cardiomegaly with mild generalized interstitial edema.
There is atelectatic change in the left base with small left
effusion. Pulmonary vascularity is within normal limits. Bones are
osteoporotic.
IMPRESSION: Cardiomegaly with interstitial edema. These findings are felt to
represent a degree of congestive heart failure. Atelectasis left
base. Small left effusion.
